# Patient Record
Sex: Female | Born: 1950
Health system: Southern US, Community
[De-identification: ages and names within clinical notes are randomized; demographics above are authoritative.]

## PROBLEM LIST (undated history)

## (undated) DIAGNOSIS — M1990 Unspecified osteoarthritis, unspecified site: Secondary | ICD-10-CM

## (undated) DIAGNOSIS — K219 Gastro-esophageal reflux disease without esophagitis: Secondary | ICD-10-CM

## (undated) DIAGNOSIS — R5383 Other fatigue: Secondary | ICD-10-CM

## (undated) DIAGNOSIS — E785 Hyperlipidemia, unspecified: Secondary | ICD-10-CM

## (undated) DIAGNOSIS — R079 Chest pain, unspecified: Secondary | ICD-10-CM

## (undated) HISTORY — DX: Other fatigue: R53.83

## (undated) HISTORY — PX: ABDOMINAL HYSTERECTOMY: SHX81

## (undated) HISTORY — DX: Chest pain, unspecified: R07.9

## (undated) HISTORY — DX: Gastro-esophageal reflux disease without esophagitis: K21.9

## (undated) HISTORY — PX: KNEE ARTHROPLASTY: SHX992

## (undated) HISTORY — DX: Hyperlipidemia, unspecified: E78.5

## (undated) HISTORY — PX: COLONOSCOPY: SHX174

---

## 2006-12-17 ENCOUNTER — Encounter (INDEPENDENT_AMBULATORY_CARE_PROVIDER_SITE_OTHER): Payer: Self-pay | Admitting: Nurse Practitioner

## 2006-12-17 ENCOUNTER — Ambulatory Visit: Payer: Self-pay | Admitting: Internal Medicine

## 2006-12-17 LAB — CONVERTED CEMR LAB
ALT: 12 units/L (ref 0–35)
AST: 18 units/L (ref 0–37)
Basophils Absolute: 0 10*3/uL (ref 0.0–0.1)
Basophils Relative: 0 % (ref 0–1)
CO2: 25 meq/L (ref 19–32)
Calcium: 9.9 mg/dL (ref 8.4–10.5)
Chloride: 105 meq/L (ref 96–112)
Cholesterol: 218 mg/dL — ABNORMAL HIGH (ref 0–200)
Lymphocytes Relative: 59 % — ABNORMAL HIGH (ref 12–46)
MCHC: 30.6 g/dL (ref 30.0–36.0)
Neutro Abs: 1.6 10*3/uL — ABNORMAL LOW (ref 1.7–7.7)
Platelets: 251 10*3/uL (ref 150–400)
RDW: 13.6 % (ref 11.5–14.0)
Sodium: 140 meq/L (ref 135–145)
TSH: 1.25 microintl units/mL (ref 0.350–5.50)
Total Bilirubin: 1.1 mg/dL (ref 0.3–1.2)
Total CHOL/HDL Ratio: 2.7
Total Protein: 7.3 g/dL (ref 6.0–8.3)
VLDL: 16 mg/dL (ref 0–40)

## 2006-12-18 ENCOUNTER — Encounter (INDEPENDENT_AMBULATORY_CARE_PROVIDER_SITE_OTHER): Payer: Self-pay | Admitting: Nurse Practitioner

## 2006-12-18 LAB — CONVERTED CEMR LAB: TSH: 1.25 microintl units/mL

## 2006-12-19 ENCOUNTER — Ambulatory Visit (HOSPITAL_COMMUNITY): Admission: RE | Admit: 2006-12-19 | Discharge: 2006-12-19 | Payer: Self-pay | Admitting: Nurse Practitioner

## 2006-12-31 ENCOUNTER — Ambulatory Visit: Payer: Self-pay | Admitting: Internal Medicine

## 2006-12-31 ENCOUNTER — Ambulatory Visit: Payer: Self-pay | Admitting: *Deleted

## 2007-02-18 ENCOUNTER — Encounter (INDEPENDENT_AMBULATORY_CARE_PROVIDER_SITE_OTHER): Payer: Self-pay | Admitting: Nurse Practitioner

## 2007-02-18 DIAGNOSIS — K5901 Slow transit constipation: Secondary | ICD-10-CM | POA: Insufficient documentation

## 2007-02-18 DIAGNOSIS — K5909 Other constipation: Secondary | ICD-10-CM

## 2007-02-18 DIAGNOSIS — M1990 Unspecified osteoarthritis, unspecified site: Secondary | ICD-10-CM | POA: Insufficient documentation

## 2007-02-18 DIAGNOSIS — Z9889 Other specified postprocedural states: Secondary | ICD-10-CM | POA: Insufficient documentation

## 2007-02-18 DIAGNOSIS — Z86718 Personal history of other venous thrombosis and embolism: Secondary | ICD-10-CM

## 2007-03-10 ENCOUNTER — Emergency Department (HOSPITAL_COMMUNITY): Admission: EM | Admit: 2007-03-10 | Discharge: 2007-03-10 | Payer: Self-pay | Admitting: Family Medicine

## 2007-04-24 ENCOUNTER — Ambulatory Visit: Payer: Self-pay | Admitting: Family Medicine

## 2007-07-01 ENCOUNTER — Ambulatory Visit: Payer: Self-pay | Admitting: Internal Medicine

## 2007-07-09 ENCOUNTER — Ambulatory Visit (HOSPITAL_COMMUNITY): Admission: RE | Admit: 2007-07-09 | Discharge: 2007-07-09 | Payer: Self-pay | Admitting: Family Medicine

## 2007-08-01 ENCOUNTER — Ambulatory Visit: Payer: Self-pay | Admitting: Family Medicine

## 2007-10-23 ENCOUNTER — Emergency Department (HOSPITAL_COMMUNITY): Admission: EM | Admit: 2007-10-23 | Discharge: 2007-10-23 | Payer: Self-pay | Admitting: Emergency Medicine

## 2007-10-27 ENCOUNTER — Ambulatory Visit: Payer: Self-pay | Admitting: Internal Medicine

## 2007-10-28 ENCOUNTER — Ambulatory Visit (HOSPITAL_COMMUNITY): Admission: RE | Admit: 2007-10-28 | Discharge: 2007-10-28 | Payer: Self-pay | Admitting: Internal Medicine

## 2007-11-05 ENCOUNTER — Ambulatory Visit: Payer: Self-pay | Admitting: Vascular Surgery

## 2007-11-05 ENCOUNTER — Encounter: Payer: Self-pay | Admitting: Internal Medicine

## 2007-11-05 ENCOUNTER — Ambulatory Visit (HOSPITAL_COMMUNITY): Admission: RE | Admit: 2007-11-05 | Discharge: 2007-11-05 | Payer: Self-pay | Admitting: Internal Medicine

## 2008-03-16 ENCOUNTER — Ambulatory Visit: Payer: Self-pay | Admitting: Family Medicine

## 2008-05-20 ENCOUNTER — Encounter (INDEPENDENT_AMBULATORY_CARE_PROVIDER_SITE_OTHER): Payer: Self-pay | Admitting: Internal Medicine

## 2008-05-20 ENCOUNTER — Ambulatory Visit: Payer: Self-pay | Admitting: Internal Medicine

## 2008-05-20 LAB — CONVERTED CEMR LAB
ALT: 18 units/L (ref 0–35)
AST: 20 units/L (ref 0–37)
CO2: 25 meq/L (ref 19–32)
Cholesterol: 218 mg/dL — ABNORMAL HIGH (ref 0–200)
Creatinine, Ser: 0.59 mg/dL (ref 0.40–1.20)
LDL Cholesterol: 136 mg/dL — ABNORMAL HIGH (ref 0–99)
Sodium: 142 meq/L (ref 135–145)
Total Bilirubin: 0.6 mg/dL (ref 0.3–1.2)
Total CHOL/HDL Ratio: 3.2
Total Protein: 7.2 g/dL (ref 6.0–8.3)
VLDL: 14 mg/dL (ref 0–40)

## 2008-09-06 ENCOUNTER — Ambulatory Visit: Payer: Self-pay | Admitting: Internal Medicine

## 2008-09-06 ENCOUNTER — Encounter (INDEPENDENT_AMBULATORY_CARE_PROVIDER_SITE_OTHER): Payer: Self-pay | Admitting: Internal Medicine

## 2008-09-06 LAB — CONVERTED CEMR LAB
Basophils Relative: 0 % (ref 0–1)
Hemoglobin: 10.9 g/dL — ABNORMAL LOW (ref 12.0–15.0)
Lymphocytes Relative: 59 % — ABNORMAL HIGH (ref 12–46)
Lymphs Abs: 3.5 10*3/uL (ref 0.7–4.0)
Monocytes Absolute: 0.3 10*3/uL (ref 0.1–1.0)
Monocytes Relative: 4 % (ref 3–12)
Neutro Abs: 2.1 10*3/uL (ref 1.7–7.7)
Neutrophils Relative %: 36 % — ABNORMAL LOW (ref 43–77)
RBC: 3.85 M/uL — ABNORMAL LOW (ref 3.87–5.11)
TSH: 0.718 microintl units/mL (ref 0.350–4.500)
WBC: 5.9 10*3/uL (ref 4.0–10.5)

## 2008-09-07 ENCOUNTER — Ambulatory Visit (HOSPITAL_COMMUNITY): Admission: RE | Admit: 2008-09-07 | Discharge: 2008-09-07 | Payer: Self-pay | Admitting: *Deleted

## 2008-10-05 ENCOUNTER — Ambulatory Visit: Payer: Self-pay | Admitting: Oncology

## 2008-10-11 LAB — CMP (CANCER CENTER ONLY)
ALT(SGPT): 39 U/L (ref 10–47)
CO2: 28 mEq/L (ref 18–33)
Calcium: 10.3 mg/dL (ref 8.0–10.3)
Chloride: 105 mEq/L (ref 98–108)
Glucose, Bld: 123 mg/dL — ABNORMAL HIGH (ref 73–118)
Sodium: 144 mEq/L (ref 128–145)
Total Protein: 7.6 g/dL (ref 6.4–8.1)

## 2008-10-11 LAB — CBC WITH DIFFERENTIAL (CANCER CENTER ONLY)
BASO%: 0.4 % (ref 0.0–2.0)
EOS%: 2.3 % (ref 0.0–7.0)
LYMPH#: 2.1 10*3/uL (ref 0.9–3.3)
MCHC: 34.9 g/dL (ref 32.0–36.0)
NEUT#: 2.3 10*3/uL (ref 1.5–6.5)
NEUT%: 49.6 % (ref 39.6–80.0)
Platelets: 255 10*3/uL (ref 145–400)
RDW: 12.1 % (ref 10.5–14.6)

## 2008-10-11 LAB — MORPHOLOGY - CHCC SATELLITE

## 2008-10-12 LAB — LACTATE DEHYDROGENASE: LDH: 110 U/L (ref 94–250)

## 2008-10-12 LAB — RETICULOCYTES (CHCC): Retic Ct Pct: 1 % (ref 0.4–3.1)

## 2008-10-12 LAB — ERYTHROPOIETIN: Erythropoietin: 17.3 m[IU]/mL (ref 2.6–34.0)

## 2008-10-12 LAB — IRON AND TIBC: %SAT: 41 % (ref 20–55)

## 2008-10-12 LAB — FOLATE: Folate: 9.8 ng/mL

## 2008-10-12 LAB — VITAMIN B12: Vitamin B-12: 522 pg/mL (ref 211–911)

## 2008-10-12 LAB — FERRITIN: Ferritin: 72 ng/mL (ref 10–291)

## 2009-02-07 ENCOUNTER — Ambulatory Visit: Payer: Self-pay | Admitting: Family Medicine

## 2009-02-08 ENCOUNTER — Encounter (INDEPENDENT_AMBULATORY_CARE_PROVIDER_SITE_OTHER): Payer: Self-pay | Admitting: Internal Medicine

## 2009-02-10 ENCOUNTER — Encounter (INDEPENDENT_AMBULATORY_CARE_PROVIDER_SITE_OTHER): Payer: Self-pay | Admitting: Internal Medicine

## 2009-02-10 ENCOUNTER — Ambulatory Visit: Payer: Self-pay | Admitting: Internal Medicine

## 2009-02-10 LAB — CONVERTED CEMR LAB
Cholesterol: 212 mg/dL — ABNORMAL HIGH (ref 0–200)
Total CHOL/HDL Ratio: 3.4
Vit D, 25-Hydroxy: 23 ng/mL — ABNORMAL LOW (ref 30–89)

## 2009-04-01 ENCOUNTER — Ambulatory Visit: Payer: Self-pay | Admitting: Family Medicine

## 2009-05-19 ENCOUNTER — Ambulatory Visit: Payer: Self-pay | Admitting: Internal Medicine

## 2009-10-18 ENCOUNTER — Ambulatory Visit: Payer: Self-pay | Admitting: Family Medicine

## 2009-12-12 ENCOUNTER — Encounter (INDEPENDENT_AMBULATORY_CARE_PROVIDER_SITE_OTHER): Payer: Self-pay | Admitting: Internal Medicine

## 2009-12-12 ENCOUNTER — Ambulatory Visit: Payer: Self-pay | Admitting: Family Medicine

## 2009-12-12 LAB — CONVERTED CEMR LAB
AST: 18 units/L (ref 0–37)
Albumin: 4 g/dL (ref 3.5–5.2)
Alkaline Phosphatase: 45 units/L (ref 39–117)
BUN: 13 mg/dL (ref 6–23)
Calcium: 9.6 mg/dL (ref 8.4–10.5)
Chloride: 108 meq/L (ref 96–112)
Glucose, Bld: 82 mg/dL (ref 70–99)
Potassium: 5.5 meq/L — ABNORMAL HIGH (ref 3.5–5.3)
Sodium: 143 meq/L (ref 135–145)
Total Protein: 6.8 g/dL (ref 6.0–8.3)

## 2009-12-26 ENCOUNTER — Ambulatory Visit: Payer: Self-pay | Admitting: Internal Medicine

## 2010-07-19 ENCOUNTER — Ambulatory Visit (HOSPITAL_COMMUNITY)
Admission: RE | Admit: 2010-07-19 | Discharge: 2010-07-19 | Disposition: A | Discharge status: Home / Self Care | Payer: Medicare Other | Source: Ambulatory Visit | Attending: Family Medicine | Admitting: Family Medicine

## 2010-07-19 ENCOUNTER — Other Ambulatory Visit (HOSPITAL_COMMUNITY): Payer: Self-pay | Admitting: Family Medicine

## 2010-07-19 DIAGNOSIS — M25569 Pain in unspecified knee: Secondary | ICD-10-CM | POA: Insufficient documentation

## 2010-07-19 DIAGNOSIS — R52 Pain, unspecified: Secondary | ICD-10-CM

## 2011-01-11 LAB — URINALYSIS, ROUTINE W REFLEX MICROSCOPIC
Bilirubin Urine: NEGATIVE
Nitrite: NEGATIVE
Specific Gravity, Urine: 1.007
Urobilinogen, UA: 0.2

## 2011-01-11 LAB — URINE MICROSCOPIC-ADD ON

## 2011-01-11 LAB — BASIC METABOLIC PANEL
BUN: 8
Chloride: 104
GFR calc non Af Amer: >60
Glucose, Bld: 139 — ABNORMAL HIGH
Potassium: 3.5

## 2011-05-07 DIAGNOSIS — L0390 Cellulitis, unspecified: Secondary | ICD-10-CM | POA: Diagnosis not present

## 2011-05-07 DIAGNOSIS — F17200 Nicotine dependence, unspecified, uncomplicated: Secondary | ICD-10-CM | POA: Diagnosis not present

## 2011-05-07 DIAGNOSIS — M2550 Pain in unspecified joint: Secondary | ICD-10-CM | POA: Diagnosis not present

## 2011-05-07 DIAGNOSIS — F519 Sleep disorder not due to a substance or known physiological condition, unspecified: Secondary | ICD-10-CM | POA: Diagnosis not present

## 2011-05-15 ENCOUNTER — Other Ambulatory Visit (HOSPITAL_COMMUNITY): Payer: Self-pay | Admitting: Family Medicine

## 2011-05-15 DIAGNOSIS — M8580 Other specified disorders of bone density and structure, unspecified site: Secondary | ICD-10-CM

## 2011-05-18 ENCOUNTER — Ambulatory Visit (HOSPITAL_COMMUNITY): Payer: Medicare Other

## 2011-05-18 ENCOUNTER — Ambulatory Visit (HOSPITAL_COMMUNITY)
Admission: RE | Admit: 2011-05-18 | Discharge: 2011-05-18 | Disposition: A | Discharge status: Home / Self Care | Payer: Medicare Other | Source: Ambulatory Visit | Attending: Family Medicine | Admitting: Family Medicine

## 2011-05-18 DIAGNOSIS — M8580 Other specified disorders of bone density and structure, unspecified site: Secondary | ICD-10-CM

## 2011-05-18 DIAGNOSIS — Z780 Asymptomatic menopausal state: Secondary | ICD-10-CM | POA: Diagnosis not present

## 2011-05-18 DIAGNOSIS — M949 Disorder of cartilage, unspecified: Secondary | ICD-10-CM | POA: Diagnosis not present

## 2011-05-18 DIAGNOSIS — M899 Disorder of bone, unspecified: Secondary | ICD-10-CM | POA: Diagnosis not present

## 2011-05-18 DIAGNOSIS — Z13820 Encounter for screening for osteoporosis: Secondary | ICD-10-CM | POA: Diagnosis not present

## 2011-10-04 DIAGNOSIS — N760 Acute vaginitis: Secondary | ICD-10-CM | POA: Diagnosis not present

## 2011-10-04 DIAGNOSIS — R319 Hematuria, unspecified: Secondary | ICD-10-CM | POA: Diagnosis not present

## 2011-10-29 DIAGNOSIS — R319 Hematuria, unspecified: Secondary | ICD-10-CM | POA: Diagnosis not present

## 2011-10-29 DIAGNOSIS — R5383 Other fatigue: Secondary | ICD-10-CM | POA: Diagnosis not present

## 2011-10-29 DIAGNOSIS — G4700 Insomnia, unspecified: Secondary | ICD-10-CM | POA: Diagnosis not present

## 2011-10-29 DIAGNOSIS — N760 Acute vaginitis: Secondary | ICD-10-CM | POA: Diagnosis not present

## 2011-10-29 DIAGNOSIS — R5381 Other malaise: Secondary | ICD-10-CM | POA: Diagnosis not present

## 2011-10-29 DIAGNOSIS — Z79899 Other long term (current) drug therapy: Secondary | ICD-10-CM | POA: Diagnosis not present

## 2011-10-29 DIAGNOSIS — E785 Hyperlipidemia, unspecified: Secondary | ICD-10-CM | POA: Diagnosis not present

## 2011-12-03 DIAGNOSIS — G5600 Carpal tunnel syndrome, unspecified upper limb: Secondary | ICD-10-CM | POA: Diagnosis not present

## 2011-12-03 DIAGNOSIS — M129 Arthropathy, unspecified: Secondary | ICD-10-CM | POA: Diagnosis not present

## 2011-12-03 DIAGNOSIS — N390 Urinary tract infection, site not specified: Secondary | ICD-10-CM | POA: Diagnosis not present

## 2011-12-03 DIAGNOSIS — N760 Acute vaginitis: Secondary | ICD-10-CM | POA: Diagnosis not present

## 2012-01-04 DIAGNOSIS — B379 Candidiasis, unspecified: Secondary | ICD-10-CM | POA: Diagnosis not present

## 2012-01-04 DIAGNOSIS — E119 Type 2 diabetes mellitus without complications: Secondary | ICD-10-CM | POA: Diagnosis not present

## 2012-01-04 DIAGNOSIS — N390 Urinary tract infection, site not specified: Secondary | ICD-10-CM | POA: Diagnosis not present

## 2012-02-06 DIAGNOSIS — Z87440 Personal history of urinary (tract) infections: Secondary | ICD-10-CM | POA: Diagnosis not present

## 2012-02-06 DIAGNOSIS — R82998 Other abnormal findings in urine: Secondary | ICD-10-CM | POA: Diagnosis not present

## 2012-03-07 DIAGNOSIS — N3020 Other chronic cystitis without hematuria: Secondary | ICD-10-CM | POA: Diagnosis not present

## 2012-03-07 DIAGNOSIS — R82998 Other abnormal findings in urine: Secondary | ICD-10-CM | POA: Diagnosis not present

## 2012-04-22 DIAGNOSIS — B373 Candidiasis of vulva and vagina: Secondary | ICD-10-CM | POA: Diagnosis not present

## 2012-04-22 DIAGNOSIS — N3020 Other chronic cystitis without hematuria: Secondary | ICD-10-CM | POA: Diagnosis not present

## 2012-05-05 DIAGNOSIS — N760 Acute vaginitis: Secondary | ICD-10-CM | POA: Diagnosis not present

## 2012-05-05 DIAGNOSIS — J309 Allergic rhinitis, unspecified: Secondary | ICD-10-CM | POA: Diagnosis not present

## 2012-05-05 DIAGNOSIS — R1084 Generalized abdominal pain: Secondary | ICD-10-CM | POA: Diagnosis not present

## 2012-06-11 DIAGNOSIS — R3129 Other microscopic hematuria: Secondary | ICD-10-CM | POA: Diagnosis not present

## 2012-07-02 DIAGNOSIS — N390 Urinary tract infection, site not specified: Secondary | ICD-10-CM | POA: Diagnosis not present

## 2012-07-02 DIAGNOSIS — R3129 Other microscopic hematuria: Secondary | ICD-10-CM | POA: Diagnosis not present

## 2012-07-02 DIAGNOSIS — N3020 Other chronic cystitis without hematuria: Secondary | ICD-10-CM | POA: Diagnosis not present

## 2012-07-14 DIAGNOSIS — M1710 Unilateral primary osteoarthritis, unspecified knee: Secondary | ICD-10-CM | POA: Diagnosis not present

## 2012-10-15 DIAGNOSIS — M25569 Pain in unspecified knee: Secondary | ICD-10-CM | POA: Diagnosis not present

## 2012-10-15 DIAGNOSIS — M1710 Unilateral primary osteoarthritis, unspecified knee: Secondary | ICD-10-CM | POA: Diagnosis not present

## 2012-10-15 DIAGNOSIS — M5137 Other intervertebral disc degeneration, lumbosacral region: Secondary | ICD-10-CM | POA: Diagnosis not present

## 2012-10-27 ENCOUNTER — Ambulatory Visit: Payer: Medicare Other | Admitting: Physical Therapy

## 2012-11-03 ENCOUNTER — Ambulatory Visit: Payer: Medicare Other | Attending: Orthopedic Surgery | Admitting: Physical Therapy

## 2012-11-03 DIAGNOSIS — M545 Low back pain, unspecified: Secondary | ICD-10-CM | POA: Insufficient documentation

## 2012-11-03 DIAGNOSIS — IMO0001 Reserved for inherently not codable concepts without codable children: Secondary | ICD-10-CM | POA: Insufficient documentation

## 2012-11-07 ENCOUNTER — Ambulatory Visit: Payer: Medicare Other | Admitting: Physical Therapy

## 2012-11-10 ENCOUNTER — Ambulatory Visit: Payer: Medicare Other | Admitting: Physical Therapy

## 2012-11-12 ENCOUNTER — Ambulatory Visit: Payer: Medicare Other | Admitting: Physical Therapy

## 2012-11-18 ENCOUNTER — Ambulatory Visit: Payer: Medicare Other | Attending: Orthopedic Surgery | Admitting: Physical Therapy

## 2012-11-18 DIAGNOSIS — IMO0001 Reserved for inherently not codable concepts without codable children: Secondary | ICD-10-CM | POA: Insufficient documentation

## 2012-11-18 DIAGNOSIS — M545 Low back pain, unspecified: Secondary | ICD-10-CM | POA: Insufficient documentation

## 2012-11-19 ENCOUNTER — Ambulatory Visit: Payer: Medicare Other | Admitting: Physical Therapy

## 2012-11-27 ENCOUNTER — Ambulatory Visit: Payer: Medicare Other | Attending: Orthopedic Surgery | Admitting: Rehabilitation

## 2012-11-27 DIAGNOSIS — M545 Low back pain, unspecified: Secondary | ICD-10-CM | POA: Diagnosis not present

## 2012-11-27 DIAGNOSIS — IMO0001 Reserved for inherently not codable concepts without codable children: Secondary | ICD-10-CM | POA: Insufficient documentation

## 2012-12-09 DIAGNOSIS — R5381 Other malaise: Secondary | ICD-10-CM | POA: Diagnosis not present

## 2012-12-09 DIAGNOSIS — Z79899 Other long term (current) drug therapy: Secondary | ICD-10-CM | POA: Diagnosis not present

## 2012-12-09 DIAGNOSIS — R634 Abnormal weight loss: Secondary | ICD-10-CM | POA: Diagnosis not present

## 2012-12-09 DIAGNOSIS — E785 Hyperlipidemia, unspecified: Secondary | ICD-10-CM | POA: Diagnosis not present

## 2012-12-24 DIAGNOSIS — M25569 Pain in unspecified knee: Secondary | ICD-10-CM | POA: Diagnosis not present

## 2013-02-20 DIAGNOSIS — R5381 Other malaise: Secondary | ICD-10-CM | POA: Diagnosis not present

## 2013-05-07 DIAGNOSIS — M129 Arthropathy, unspecified: Secondary | ICD-10-CM | POA: Diagnosis not present

## 2013-08-10 DIAGNOSIS — R5381 Other malaise: Secondary | ICD-10-CM | POA: Diagnosis not present

## 2013-08-10 DIAGNOSIS — R5383 Other fatigue: Secondary | ICD-10-CM | POA: Diagnosis not present

## 2013-08-10 DIAGNOSIS — K644 Residual hemorrhoidal skin tags: Secondary | ICD-10-CM | POA: Diagnosis not present

## 2013-08-10 DIAGNOSIS — R634 Abnormal weight loss: Secondary | ICD-10-CM | POA: Diagnosis not present

## 2013-08-10 DIAGNOSIS — Z79899 Other long term (current) drug therapy: Secondary | ICD-10-CM | POA: Diagnosis not present

## 2013-08-10 DIAGNOSIS — E785 Hyperlipidemia, unspecified: Secondary | ICD-10-CM | POA: Diagnosis not present

## 2013-08-10 DIAGNOSIS — M129 Arthropathy, unspecified: Secondary | ICD-10-CM | POA: Diagnosis not present

## 2013-08-27 DIAGNOSIS — H2510 Age-related nuclear cataract, unspecified eye: Secondary | ICD-10-CM | POA: Diagnosis not present

## 2013-08-27 DIAGNOSIS — H40039 Anatomical narrow angle, unspecified eye: Secondary | ICD-10-CM | POA: Diagnosis not present

## 2013-11-10 DIAGNOSIS — R3129 Other microscopic hematuria: Secondary | ICD-10-CM | POA: Diagnosis not present

## 2013-12-10 DIAGNOSIS — M25569 Pain in unspecified knee: Secondary | ICD-10-CM | POA: Diagnosis not present

## 2013-12-10 DIAGNOSIS — M5137 Other intervertebral disc degeneration, lumbosacral region: Secondary | ICD-10-CM | POA: Diagnosis not present

## 2014-01-13 DIAGNOSIS — M25569 Pain in unspecified knee: Secondary | ICD-10-CM | POA: Diagnosis not present

## 2014-01-21 DIAGNOSIS — M25562 Pain in left knee: Secondary | ICD-10-CM | POA: Diagnosis not present

## 2014-06-04 DIAGNOSIS — M1712 Unilateral primary osteoarthritis, left knee: Secondary | ICD-10-CM | POA: Diagnosis not present

## 2014-07-29 DIAGNOSIS — R079 Chest pain, unspecified: Secondary | ICD-10-CM | POA: Diagnosis not present

## 2014-07-29 DIAGNOSIS — R0789 Other chest pain: Secondary | ICD-10-CM | POA: Diagnosis not present

## 2014-07-29 DIAGNOSIS — K219 Gastro-esophageal reflux disease without esophagitis: Secondary | ICD-10-CM | POA: Diagnosis not present

## 2014-07-29 DIAGNOSIS — E780 Pure hypercholesterolemia: Secondary | ICD-10-CM | POA: Diagnosis not present

## 2014-07-29 DIAGNOSIS — G4700 Insomnia, unspecified: Secondary | ICD-10-CM | POA: Diagnosis not present

## 2014-07-29 DIAGNOSIS — R5383 Other fatigue: Secondary | ICD-10-CM | POA: Diagnosis not present

## 2014-07-29 DIAGNOSIS — R5382 Chronic fatigue, unspecified: Secondary | ICD-10-CM | POA: Diagnosis not present

## 2014-08-10 ENCOUNTER — Ambulatory Visit (INDEPENDENT_AMBULATORY_CARE_PROVIDER_SITE_OTHER): Payer: Medicare Other | Admitting: Cardiology

## 2014-08-10 ENCOUNTER — Encounter: Payer: Self-pay | Admitting: Cardiology

## 2014-08-10 VITALS — BP 100/62 | HR 72 | Ht 61.0 in | Wt 102.8 lb

## 2014-08-10 DIAGNOSIS — R079 Chest pain, unspecified: Secondary | ICD-10-CM | POA: Insufficient documentation

## 2014-08-10 DIAGNOSIS — R0789 Other chest pain: Secondary | ICD-10-CM

## 2014-08-10 NOTE — Progress Notes (Signed)
Sarah Moore Date of Birth: [birth year] Medical Record #[id number]  History of Present Illness: Sarah Moore is seen at the request of Dr. Jimmye Norman for evaluation of chest pain. She reports she began having symptoms of chest tightness about 3 weeks ago. No clear aggravating or alleviating factors. Not associated with exertion or meals. Symptoms localized to left sternal area. No dyspnea or diaphoresis. No prior cardiac history. Risk factors of smoking and mild hypercholesterolemia. Since starting on Prilosec symptoms have completely resolved.    Medication List       This list is accurate as of: 08/10/14  3:11 PM.  Always use your most recent med list.               aspirin 81 MG tablet  Take 81 mg by mouth daily.     omeprazole 20 MG capsule  Commonly known as:  PRILOSEC  Take 20 mg by mouth daily.         Allergies  Allergen Reactions  . Codeine     Past Medical History  Diagnosis Date  . Chest pain   . Acid reflux   . Fatigue   . Hyperlipidemia     Past Surgical History  Procedure Laterality Date  . Knee surgery Left     arthroscopic    History   Social History  . Marital Status: Single    Spouse Name: N/A  . Number of Children: 3  . Years of Education: N/A   Occupational History  . [employer]    Social History Main Topics  . Smoking status: Current Every Day Smoker  . Smokeless tobacco: Not on file  . Alcohol Use: Not on file  . Drug Use: Not on file  . Sexual Activity: Not on file   Other Topics Concern  . None   Social History Narrative    Family History  Problem Relation Age of Onset  . Adopted: Yes  . Asthma Brother   . Cancer Brother     Review of Systems: As noted in HPI. Some weight loss this year.  All other systems were reviewed and are negative.  Physical Exam: BP 100/62 mmHg  Pulse 72  Ht 5\' 1"  (1.549 m)  Wt 102 lb 12.8 oz (46.63 kg)  BMI 19.43 kg/m2 Filed Weights   08/10/14 1419  Weight: 102 lb 12.8 oz (46.63  kg)  GENERAL:  Well appearing, thin BF in NAD HEENT:  PERRL, EOMI, sclera are clear. Oropharynx is clear. NECK:  No jugular venous distention, carotid upstroke brisk and symmetric, no bruits, no thyromegaly or adenopathy LUNGS:  Clear to auscultation bilaterally CHEST:  Unremarkable HEART:  RRR,  PMI not displaced or sustained,S1 and S2 within normal limits, no S3, no S4: no clicks, no rubs, no murmurs ABD:  Soft, nontender. BS +, no masses or bruits. No hepatomegaly, no splenomegaly EXT:  2 + pulses throughout, no edema, no cyanosis no clubbing SKIN:  Warm and dry.  No rashes NEURO:  Alert and oriented x 3. Cranial nerves II through XII intact. PSYCH:  Cognitively intact    LABORATORY DATA: Ecg dated 07/29/14 shows NSR with normal Ecg.  Assessment / Plan: 1. Atypical chest pain. Patient does have risk factors of tobacco abuse and hypercholesterolemia. Symptoms have completely resolved with antireflux therapy suggesting that this is all acid reflux. Given her improvement I don't feel that further cardiac evaluation is warranted. If symptoms recur would consider stress testing and I have asked her to contact me  if symptoms return.  2. Tobacco abuse- recommend smoking cessation.

## 2014-08-10 NOTE — Patient Instructions (Signed)
Continue your current therapy  If your chest pain recurs on omeprazole we would consider doing a stress test. Call me if your symptoms increase.

## 2014-08-22 ENCOUNTER — Emergency Department (HOSPITAL_COMMUNITY): Payer: Medicare Other

## 2014-08-22 ENCOUNTER — Encounter (HOSPITAL_COMMUNITY): Payer: Self-pay | Admitting: Emergency Medicine

## 2014-08-22 ENCOUNTER — Emergency Department (HOSPITAL_COMMUNITY)
Admission: EM | Admit: 2014-08-22 | Discharge: 2014-08-22 | Disposition: A | Discharge status: Home / Self Care | Payer: Medicare Other | Attending: Emergency Medicine | Admitting: Emergency Medicine

## 2014-08-22 DIAGNOSIS — Z8639 Personal history of other endocrine, nutritional and metabolic disease: Secondary | ICD-10-CM | POA: Diagnosis not present

## 2014-08-22 DIAGNOSIS — Z7982 Long term (current) use of aspirin: Secondary | ICD-10-CM | POA: Diagnosis not present

## 2014-08-22 DIAGNOSIS — K219 Gastro-esophageal reflux disease without esophagitis: Secondary | ICD-10-CM | POA: Diagnosis not present

## 2014-08-22 DIAGNOSIS — R079 Chest pain, unspecified: Secondary | ICD-10-CM | POA: Insufficient documentation

## 2014-08-22 DIAGNOSIS — Z720 Tobacco use: Secondary | ICD-10-CM | POA: Diagnosis not present

## 2014-08-22 DIAGNOSIS — R0789 Other chest pain: Secondary | ICD-10-CM | POA: Diagnosis not present

## 2014-08-22 DIAGNOSIS — Z79899 Other long term (current) drug therapy: Secondary | ICD-10-CM | POA: Insufficient documentation

## 2014-08-22 DIAGNOSIS — K2100 Gastro-esophageal reflux disease with esophagitis, without bleeding: Secondary | ICD-10-CM

## 2014-08-22 DIAGNOSIS — M1990 Unspecified osteoarthritis, unspecified site: Secondary | ICD-10-CM | POA: Diagnosis not present

## 2014-08-22 HISTORY — DX: Unspecified osteoarthritis, unspecified site: M19.90

## 2014-08-22 LAB — COMPREHENSIVE METABOLIC PANEL
ALBUMIN: 3.6 g/dL (ref 3.5–5.0)
ALK PHOS: 60 U/L (ref 38–126)
ALT: 20 U/L (ref 14–54)
ANION GAP: 8 (ref 5–15)
AST: 22 U/L (ref 15–41)
BUN: 14 mg/dL (ref 6–20)
CALCIUM: 9.1 mg/dL (ref 8.9–10.3)
CO2: 24 mmol/L (ref 22–32)
Chloride: 105 mmol/L (ref 101–111)
Creatinine, Ser: 0.55 mg/dL (ref 0.44–1.00)
GFR calc non Af Amer: >60 mL/min (ref >60)
GLUCOSE: 73 mg/dL (ref 70–99)
POTASSIUM: 4.4 mmol/L (ref 3.5–5.1)
Sodium: 137 mmol/L (ref 135–145)
TOTAL PROTEIN: 7 g/dL (ref 6.5–8.1)
Total Bilirubin: 1 mg/dL (ref 0.3–1.2)

## 2014-08-22 LAB — CBC WITH DIFFERENTIAL/PLATELET
Basophils Absolute: 0 10*3/uL (ref 0.0–0.1)
Basophils Relative: 0 % (ref 0–1)
EOS PCT: 0 % (ref 0–5)
Eosinophils Absolute: 0 10*3/uL (ref 0.0–0.7)
HCT: 34.6 % — ABNORMAL LOW (ref 36.0–46.0)
HEMOGLOBIN: 11.2 g/dL — AB (ref 12.0–15.0)
LYMPHS ABS: 1.8 10*3/uL (ref 0.7–4.0)
Lymphocytes Relative: 42 % (ref 12–46)
MCH: 29.4 pg (ref 26.0–34.0)
MCHC: 32.4 g/dL (ref 30.0–36.0)
MCV: 90.8 fL (ref 78.0–100.0)
MONOS PCT: 16 % — AB (ref 3–12)
Monocytes Absolute: 0.7 10*3/uL (ref 0.1–1.0)
Neutro Abs: 1.8 10*3/uL (ref 1.7–7.7)
Neutrophils Relative %: 42 % — ABNORMAL LOW (ref 43–77)
Platelets: 207 10*3/uL (ref 150–400)
RBC: 3.81 MIL/uL — ABNORMAL LOW (ref 3.87–5.11)
RDW: 13.4 % (ref 11.5–15.5)
WBC: 4.2 10*3/uL (ref 4.0–10.5)

## 2014-08-22 LAB — LIPASE, BLOOD: Lipase: 18 U/L — ABNORMAL LOW (ref 22–51)

## 2014-08-22 LAB — TROPONIN I

## 2014-08-22 MED ORDER — SUCRALFATE 1 G PO TABS: 1.0000 g | ORAL_TABLET | Freq: Three times a day (TID) | ORAL | Status: DC

## 2014-08-22 MED ORDER — GI COCKTAIL ~~LOC~~
30.0000 mL | Freq: Once | ORAL | Status: AC
  Administered 2014-08-22: 30 mL via ORAL
  Filled 2014-08-22: qty 30

## 2014-08-22 MED ORDER — SUCRALFATE 1 G PO TABS
1.0000 g | ORAL_TABLET | Freq: Once | ORAL | Status: AC
  Administered 2014-08-22: 1 g via ORAL
  Filled 2014-08-22: qty 1

## 2014-08-22 NOTE — ED Notes (Signed)
Pt states she has had recurring chest discomfort and acid reflux.  Was given Prilosec, has taken this morning, but no relief. Pain with no other symptoms, states pain is 6/10 with burning.

## 2014-08-22 NOTE — ED Notes (Signed)
Patient transported to X-ray 

## 2014-08-22 NOTE — ED Notes (Signed)
Awake. Verbally responsive. Resp even and unlabored. No audible adventitious breath sounds noted. ABC's intact. Abd soft/nondistended but tender to palpate. BS (+) and active x4 quadrants. No N/V/D reported. 

## 2014-08-22 NOTE — Discharge Instructions (Signed)
Gastroesophageal Reflux Disease, Adult  Gastroesophageal reflux disease (GERD) happens when acid from your stomach flows up into the esophagus. When acid comes in contact with the esophagus, the acid causes soreness (inflammation) in the esophagus. Over time, GERD may create small holes (ulcers) in the lining of the esophagus.  CAUSES   · Increased body weight. This puts pressure on the stomach, making acid rise from the stomach into the esophagus.  · Smoking. This increases acid production in the stomach.  · Drinking alcohol. This causes decreased pressure in the lower esophageal sphincter (valve or ring of muscle between the esophagus and stomach), allowing acid from the stomach into the esophagus.  · Late evening meals and a full stomach. This increases pressure and acid production in the stomach.  · A malformed lower esophageal sphincter.  Sometimes, no cause is found.  SYMPTOMS   · Burning pain in the lower part of the mid-chest behind the breastbone and in the mid-stomach area. This may occur twice a week or more often.  · Trouble swallowing.  · Sore throat.  · Dry cough.  · Asthma-like symptoms including chest tightness, shortness of breath, or wheezing.  DIAGNOSIS   Your caregiver may be able to diagnose GERD based on your symptoms. In some cases, X-rays and other tests may be done to check for complications or to check the condition of your stomach and esophagus.  TREATMENT   Your caregiver may recommend over-the-counter or prescription medicines to help decrease acid production. Ask your caregiver before starting or adding any new medicines.   HOME CARE INSTRUCTIONS   · Change the factors that you can control. Ask your caregiver for guidance concerning weight loss, quitting smoking, and alcohol consumption.  · Avoid foods and drinks that make your symptoms worse, such as:  ¨ Caffeine or alcoholic drinks.  ¨ Chocolate.  ¨ Peppermint or mint flavorings.  ¨ Garlic and onions.  ¨ Spicy foods.  ¨ Citrus fruits,  such as oranges, lemons, or limes.  ¨ Tomato-based foods such as sauce, chili, salsa, and pizza.  ¨ Fried and fatty foods.  · Avoid lying down for the 3 hours prior to your bedtime or prior to taking a nap.  · Eat small, frequent meals instead of large meals.  · Wear loose-fitting clothing. Do not wear anything tight around your waist that causes pressure on your stomach.  · Raise the head of your bed 6 to 8 inches with wood blocks to help you sleep. Extra pillows will not help.  · Only take over-the-counter or prescription medicines for pain, discomfort, or fever as directed by your caregiver.  · Do not take aspirin, ibuprofen, or other nonsteroidal anti-inflammatory drugs (NSAIDs).  SEEK IMMEDIATE MEDICAL CARE IF:   · You have pain in your arms, neck, jaw, teeth, or back.  · Your pain increases or changes in intensity or duration.  · You develop nausea, vomiting, or sweating (diaphoresis).  · You develop shortness of breath, or you faint.  · Your vomit is green, yellow, black, or looks like coffee grounds or blood.  · Your stool is red, bloody, or black.  These symptoms could be signs of other problems, such as heart disease, gastric bleeding, or esophageal bleeding.  MAKE SURE YOU:   · Understand these instructions.  · Will watch your condition.  · Will get help right away if you are not doing well or get worse.  Document Released: 01/10/2005 Document Revised: 06/25/2011 Document Reviewed: 10/20/2010  ExitCare® Patient   Information ©2015 [employer]. This information is not intended to replace advice given to you by your health care provider. Make sure you discuss any questions you have with your health care provider.  Food Choices for Gastroesophageal Reflux Disease  When you have gastroesophageal reflux disease (GERD), the foods you eat and your eating habits are very important. Choosing the right foods can help ease the discomfort of GERD.  WHAT GENERAL GUIDELINES DO I NEED TO FOLLOW?  · Choose fruits,  vegetables, whole grains, low-fat dairy products, and low-fat meat, fish, and poultry.  · Limit fats such as oils, salad dressings, butter, nuts, and avocado.  · Keep a food diary to identify foods that cause symptoms.  · Avoid foods that cause reflux. These may be different for different people.  · Eat frequent small meals instead of three large meals each day.  · Eat your meals slowly, in a relaxed setting.  · Limit fried foods.  · Cook foods using methods other than frying.  · Avoid drinking alcohol.  · Avoid drinking large amounts of liquids with your meals.  · Avoid bending over or lying down until 2-3 hours after eating.  WHAT FOODS ARE NOT RECOMMENDED?  The following are some foods and drinks that may worsen your symptoms:  Vegetables  Tomatoes. Tomato juice. Tomato and spaghetti sauce. Chili peppers. Onion and garlic. Horseradish.  Fruits  Oranges, grapefruit, and lemon (fruit and juice).  Meats  High-fat meats, fish, and poultry. This includes hot dogs, ribs, ham, sausage, salami, and bacon.  Dairy  Whole milk and chocolate milk. Sour cream. Cream. Butter. Ice cream. Cream cheese.   Beverages  Coffee and tea, with or without caffeine. Carbonated beverages or energy drinks.  Condiments  Hot sauce. Barbecue sauce.   Sweets/Desserts  Chocolate and cocoa. Donuts. Peppermint and spearmint.  Fats and Oils  High-fat foods, including French fries and potato chips.  Other  Vinegar. Strong spices, such as black pepper, white pepper, red pepper, cayenne, curry powder, cloves, ginger, and chili powder.  The items listed above may not be a complete list of foods and beverages to avoid. Contact your dietitian for more information.  Document Released: 04/02/2005 Document Revised: 04/07/2013 Document Reviewed: 02/04/2013  ExitCare® Patient Information ©2015 [employer]. This information is not intended to replace advice given to you by your health care provider. Make sure you discuss any questions you have with your  health care provider.

## 2014-08-22 NOTE — ED Notes (Signed)
Pt in exray, nurse will start IV when pt get's back.

## 2014-08-22 NOTE — ED Notes (Signed)
Awake. Verbally responsive. A/O x4. Resp even and unlabored. No audible adventitious breath sounds noted. ABC's intact.  

## 2014-08-22 NOTE — ED Provider Notes (Signed)
CSN: [id number]     Arrival date & time 08/22/14  1152 History   First MD Initiated Contact with Patient 08/22/14 1214     Chief Complaint  Patient presents with  . Chest Pain     (Consider location/radiation/quality/duration/timing/severity/associated sxs/prior Treatment) HPI Comments: Patient here complaining of 24 hours persistent epigastric pain radiating to her chest. Pain started after she ate fried chicken. History of esophagitis that was by her physician. Denies any anginal type symptoms. No vomiting or fever. Diarrhea noted. No cough or congestion. Has been using her and assess as directed. Has not tried to eat anything since the initial consult. Denies any gallbladder history. Symptoms persistent. Has been seen by cardiology in the past and also felt that this was GERD.  Patient is a 64 y.o. female presenting with chest pain. The history is provided by the patient.  Chest Pain   Past Medical History  Diagnosis Date  . Chest pain   . Acid reflux   . Fatigue   . Hyperlipidemia   . Arthritis    Past Surgical History  Procedure Laterality Date  . Knee surgery Left     arthroscopic   Family History  Problem Relation Age of Onset  . Adopted: Yes  . Asthma Brother   . Cancer Brother    History  Substance Use Topics  . Smoking status: Current Every Day Smoker  . Smokeless tobacco: Not on file  . Alcohol Use: No   OB History    No data available     Review of Systems  Cardiovascular: Positive for chest pain.  All other systems reviewed and are negative.     Allergies  Codeine  Home Medications   Prior to Admission medications   Medication Sig Start Date End Date Taking? Authorizing Provider  aspirin 81 MG tablet Take 81 mg by mouth daily.    Historical Provider, MD  omeprazole (PRILOSEC) 20 MG capsule Take 20 mg by mouth daily.    Historical Provider, MD   BP 114/66 mmHg  Pulse 87  Temp(Src) 98.7 F (37.1 C) (Oral)  Resp 16  SpO2 99% Physical Exam   Constitutional: She is oriented to person, place, and time. She appears well-developed and well-nourished.  Non-toxic appearance. No distress.  HENT:  Head: Normocephalic and atraumatic.  Eyes: Conjunctivae, EOM and lids are normal. Pupils are equal, round, and reactive to light.  Neck: Normal range of motion. Neck supple. No tracheal deviation present. No thyroid mass present.  Cardiovascular: Normal rate, regular rhythm and normal heart sounds.  Exam reveals no gallop.   No murmur heard. Pulmonary/Chest: Effort normal and breath sounds normal. No stridor. No respiratory distress. She has no decreased breath sounds. She has no wheezes. She has no rhonchi. She has no rales.  Abdominal: Soft. Normal appearance and bowel sounds are normal. She exhibits no distension. There is tenderness in the epigastric area. There is no rigidity, no rebound, no guarding and no CVA tenderness.  Musculoskeletal: Normal range of motion. She exhibits no edema or tenderness.  Neurological: She is alert and oriented to person, place, and time. She has normal strength. No cranial nerve deficit or sensory deficit. GCS eye subscore is 4. GCS verbal subscore is 5. GCS motor subscore is 6.  Skin: Skin is warm and dry. No abrasion and no rash noted.  Psychiatric: She has a normal mood and affect. Her speech is normal and behavior is normal.  Nursing note and vitals reviewed.   ED Course  Procedures (including critical care time) Labs Review Labs Reviewed  CBC WITH DIFFERENTIAL/PLATELET  COMPREHENSIVE METABOLIC PANEL  LIPASE, BLOOD  TROPONIN I    Imaging Review No results found.   EKG Interpretation   Date/Time:  Sunday Aug 22 2014 12:08:18 EDT Ventricular Rate:  88 PR Interval:  181 QRS Duration: 85 QT Interval:  362 QTC Calculation: 438 R Axis:   34 Text Interpretation:  Sinus rhythm Probable left atrial enlargement RSR'  in V1 or V2, probably normal variant Confirmed by Dotti Busey  MD, Aarianna Hoadley  ([id number]) on  08/22/2014 12:22:46 PM      MDM   Final diagnoses:  Chest pain      Pt given meds for reflux and feels better, doubt acs, suspect gerd, stable for d/c  Lacretia Leigh, MD 08/22/14 [phone]

## 2014-08-22 NOTE — ED Notes (Signed)
Awake. Verbally responsive. A/O x4. Resp even and unlabored. No audible adventitious breath sounds noted. ABC's intact. SR on monitor. IV saline lock patent and intact. Family at bedside. 

## 2014-08-24 ENCOUNTER — Emergency Department (HOSPITAL_COMMUNITY)
Admission: EM | Admit: 2014-08-24 | Discharge: 2014-08-24 | Disposition: A | Discharge status: Home / Self Care | Payer: Medicare Other | Attending: Emergency Medicine | Admitting: Emergency Medicine

## 2014-08-24 ENCOUNTER — Encounter (HOSPITAL_COMMUNITY): Payer: Self-pay

## 2014-08-24 DIAGNOSIS — Z7982 Long term (current) use of aspirin: Secondary | ICD-10-CM | POA: Diagnosis not present

## 2014-08-24 DIAGNOSIS — I959 Hypotension, unspecified: Secondary | ICD-10-CM | POA: Diagnosis not present

## 2014-08-24 DIAGNOSIS — Z8639 Personal history of other endocrine, nutritional and metabolic disease: Secondary | ICD-10-CM | POA: Insufficient documentation

## 2014-08-24 DIAGNOSIS — R55 Syncope and collapse: Secondary | ICD-10-CM | POA: Diagnosis not present

## 2014-08-24 DIAGNOSIS — Z79899 Other long term (current) drug therapy: Secondary | ICD-10-CM | POA: Diagnosis not present

## 2014-08-24 DIAGNOSIS — K219 Gastro-esophageal reflux disease without esophagitis: Secondary | ICD-10-CM | POA: Insufficient documentation

## 2014-08-24 DIAGNOSIS — R42 Dizziness and giddiness: Secondary | ICD-10-CM | POA: Insufficient documentation

## 2014-08-24 DIAGNOSIS — R531 Weakness: Secondary | ICD-10-CM | POA: Diagnosis not present

## 2014-08-24 DIAGNOSIS — Z87891 Personal history of nicotine dependence: Secondary | ICD-10-CM | POA: Insufficient documentation

## 2014-08-24 DIAGNOSIS — R404 Transient alteration of awareness: Secondary | ICD-10-CM | POA: Diagnosis not present

## 2014-08-24 DIAGNOSIS — M1990 Unspecified osteoarthritis, unspecified site: Secondary | ICD-10-CM | POA: Insufficient documentation

## 2014-08-24 LAB — CBC WITH DIFFERENTIAL/PLATELET
BASOS ABS: 0 10*3/uL (ref 0.0–0.1)
BASOS PCT: 0 % (ref 0–1)
EOS PCT: 0 % (ref 0–5)
Eosinophils Absolute: 0 10*3/uL (ref 0.0–0.7)
HEMATOCRIT: 34.3 % — AB (ref 36.0–46.0)
Hemoglobin: 11.2 g/dL — ABNORMAL LOW (ref 12.0–15.0)
Lymphocytes Relative: 51 % — ABNORMAL HIGH (ref 12–46)
Lymphs Abs: 2 10*3/uL (ref 0.7–4.0)
MCH: 28.9 pg (ref 26.0–34.0)
MCHC: 32.7 g/dL (ref 30.0–36.0)
MCV: 88.4 fL (ref 78.0–100.0)
MONO ABS: 0.4 10*3/uL (ref 0.1–1.0)
MONOS PCT: 10 % (ref 3–12)
Neutro Abs: 1.6 10*3/uL — ABNORMAL LOW (ref 1.7–7.7)
Neutrophils Relative %: 39 % — ABNORMAL LOW (ref 43–77)
Platelets: 171 10*3/uL (ref 150–400)
RBC: 3.88 MIL/uL (ref 3.87–5.11)
RDW: 13.3 % (ref 11.5–15.5)
WBC: 4 10*3/uL (ref 4.0–10.5)

## 2014-08-24 LAB — COMPREHENSIVE METABOLIC PANEL
ALT: 24 U/L (ref 14–54)
AST: 31 U/L (ref 15–41)
Albumin: 3.4 g/dL — ABNORMAL LOW (ref 3.5–5.0)
Alkaline Phosphatase: 53 U/L (ref 38–126)
Anion gap: 8 (ref 5–15)
BILIRUBIN TOTAL: 1.1 mg/dL (ref 0.3–1.2)
BUN: 9 mg/dL (ref 6–20)
CO2: 25 mmol/L (ref 22–32)
CREATININE: 0.59 mg/dL (ref 0.44–1.00)
Calcium: 9.4 mg/dL (ref 8.9–10.3)
Chloride: 107 mmol/L (ref 101–111)
Glucose, Bld: 105 mg/dL — ABNORMAL HIGH (ref 70–99)
POTASSIUM: 4.1 mmol/L (ref 3.5–5.1)
Sodium: 140 mmol/L (ref 135–145)
Total Protein: 6.5 g/dL (ref 6.5–8.1)

## 2014-08-24 LAB — CBG MONITORING, ED: GLUCOSE-CAPILLARY: 84 mg/dL (ref 70–99)

## 2014-08-24 MED ORDER — SODIUM CHLORIDE 0.9 % IV BOLUS (SEPSIS)
1000.0000 mL | Freq: Once | INTRAVENOUS | Status: AC
  Administered 2014-08-24: 1000 mL via INTRAVENOUS

## 2014-08-24 NOTE — ED Notes (Signed)
[facility]- called out for near syncope. Hypotensive en route, as low as 64 systolic. Pt a&o X4, never lost consciousness. C/o back pain last night, none at present. Last bp 80 palpated

## 2014-08-24 NOTE — Discharge Instructions (Signed)
Near-Syncope Near-syncope (commonly known as near fainting) is sudden weakness, dizziness, or feeling like you might pass out. During an episode of near-syncope, you may also develop pale skin, have tunnel vision, or feel sick to your stomach (nauseous). Near-syncope may occur when getting up after sitting or while standing for a long time. It is caused by a sudden decrease in blood flow to the brain. This decrease can result from various causes or triggers, most of which are not serious. However, because near-syncope can sometimes be a sign of something serious, a medical evaluation is required. The specific cause is often not determined. HOME CARE INSTRUCTIONS  Monitor your condition for any changes. The following actions may help to alleviate any discomfort you are experiencing:  Have someone stay with you until you feel stable.  Lie down right away and prop your feet up if you start feeling like you might faint. Breathe deeply and steadily. Wait until all the symptoms have passed. Most of these episodes last only a few minutes. You may feel tired for several hours.   Drink enough fluids to keep your urine clear or pale yellow.   If you are taking blood pressure or heart medicine, get up slowly when seated or lying down. Take several minutes to sit and then stand. This can reduce dizziness.  Follow up with your health care provider as directed. SEEK IMMEDIATE MEDICAL CARE IF:   You have a severe headache.   You have unusual pain in the chest, abdomen, or back.   You are bleeding from the mouth or rectum, or you have black or tarry stool.   You have an irregular or very fast heartbeat.   You have repeated fainting or have seizure-like jerking during an episode.   You faint when sitting or lying down.   You have confusion.   You have difficulty walking.   You have severe weakness.   You have vision problems.  MAKE SURE YOU:   Understand these instructions.  Will  watch your condition.  Will get help right away if you are not doing well or get worse. Document Released: 04/02/2005 Document Revised: 04/07/2013 Document Reviewed: 09/05/2012 ExitCare Patient Information 2015 [employer]. This information is not intended to replace advice given to you by your health care provider. Make sure you discuss any questions you have with your health care provider.  

## 2014-08-24 NOTE — ED Notes (Signed)
CBG 84. 

## 2014-08-24 NOTE — ED Provider Notes (Signed)
CSN: [id number]     Arrival date & time 08/24/14  1013 History   First MD Initiated Contact with Patient 08/24/14 1019     Chief Complaint  Patient presents with  . Hypotension  . Near Syncope     (Consider location/radiation/quality/duration/timing/severity/associated sxs/prior Treatment) HPI Comments: 64 year old female with near-syncope this morning. Patient reports decreased by mouth intake yesterday and no by mouth intake this morning. She went to work where she began feeling slightly lightheaded and warm. She sat down when she felt lightheaded. Had no true syncopal episode. No chest pain no shortness of breath.  Patient is a 64 y.o. female presenting with near-syncope. The history is provided by the patient.  Near Syncope The current episode started today. The problem occurs rarely. The problem has been resolved. Pertinent negatives include no abdominal pain, chest pain, congestion, fever, rash or weakness. Nothing aggravates the symptoms. She has tried nothing for the symptoms.    Past Medical History  Diagnosis Date  . Chest pain   . Acid reflux   . Fatigue   . Hyperlipidemia   . Arthritis    Past Surgical History  Procedure Laterality Date  . Knee surgery Left     arthroscopic   Family History  Problem Relation Age of Onset  . Adopted: Yes  . Asthma Brother   . Cancer Brother    History  Substance Use Topics  . Smoking status: Former Research scientist (life sciences)  . Smokeless tobacco: Not on file  . Alcohol Use: No   OB History    No data available     Review of Systems  Constitutional: Negative for fever.  HENT: Negative for congestion.   Respiratory: Negative for shortness of breath.   Cardiovascular: Positive for near-syncope. Negative for chest pain.  Gastrointestinal: Negative for abdominal pain.  Skin: Negative for rash.  Neurological: Positive for light-headedness. Negative for weakness.  Psychiatric/Behavioral: Negative for confusion.  All other systems reviewed and  are negative.     Allergies  Codeine  Home Medications   Prior to Admission medications   Medication Sig Start Date End Date Taking? Authorizing Provider  aspirin 81 MG chewable tablet Chew 81 mg by mouth daily.   Yes Historical Provider, MD  aspirin-acetaminophen-caffeine (EXCEDRIN MIGRAINE) [id number] MG per tablet Take 2 tablets by mouth every 6 (six) hours as needed (arthritis).    Yes Historical Provider, MD  Multiple Vitamin (MULTIVITAMIN WITH MINERALS) TABS tablet Take 1 tablet by mouth daily.   Yes Historical Provider, MD  omeprazole (PRILOSEC) 20 MG capsule Take 20 mg by mouth daily.   Yes Historical Provider, MD  sucralfate (CARAFATE) 1 G tablet Take 1 tablet (1 g total) by mouth 4 (four) times daily -  with meals and at bedtime. 08/22/14  Yes Lacretia Leigh, MD   BP 121/78 mmHg  Pulse 73  Temp(Src) 97.7 F (36.5 C) (Oral)  Resp 19  SpO2 100% Physical Exam  Constitutional: She appears well-developed.  HENT:  Head: Normocephalic and atraumatic.  Eyes: Conjunctivae are normal. Pupils are equal, round, and reactive to light.  Neck: Normal range of motion.  Cardiovascular: Normal rate and intact distal pulses.   No murmur heard. Pulmonary/Chest: Effort normal. No respiratory distress.  Abdominal: Soft. She exhibits no distension. There is no tenderness.  Musculoskeletal: Normal range of motion. She exhibits no edema.  Neurological: She is alert.  Skin: Skin is warm.  Psychiatric: She has a normal mood and affect.  Vitals reviewed.   ED Course  EKG  Date/Time: 08/24/2014 12:57 PM Performed by: Louie Flenner Authorized by: Sherwood Gambler Interpreted by ED physician Rhythm: sinus rhythm Rate: normal QRS axis: normal Conduction: conduction normal ST Segments: ST segments normal T Waves: T waves normal Other: no other findings Clinical impression: normal ECG   (including critical care time) Labs Review Labs Reviewed  CBC WITH DIFFERENTIAL/PLATELET - Abnormal;  Notable for the following:    Hemoglobin 11.2 (*)    HCT 34.3 (*)    Neutrophils Relative % 39 (*)    Neutro Abs 1.6 (*)    Lymphocytes Relative 51 (*)    All other components within normal limits  COMPREHENSIVE METABOLIC PANEL - Abnormal; Notable for the following:    Glucose, Bld 105 (*)    Albumin 3.4 (*)    All other components within normal limits  CBG MONITORING, ED    Imaging Review No results found.   EKG Interpretation None      EKG: normal EKG, normal sinus rhythm, normal sinus rhythm.   MDM   64 year old female comes in with near-syncope. Patient was initially reported by EMS to be hypertensive. However on arrival to the department patient noted to have blood pressures greater than 627 systolic. Remainder vitals appropriate. Per EMS they were having difficulties obtaining automatic blood pressures on this patient. This time I will just suspect her brought blood pressure was significantly lowered. Likely technical difficulties with obtaining pressures. Patient denies chest pain shortness breath. Had no true syncopal event. She does report decreased by mouth intake the last 2 days. Patient signed symptoms most consistent with possible hypovolemia or vasovagal type presyncope. Orthostatics negative. CBC showed no anemia. CMP normal. EKG without signs of arrhythmia or ischemia. Patient endorsing any chest pain shortness breath or other concerning for ACS. On reevaluation patient continues to be stable. Is able to ambulate without issue. Appropriate for outpatient management. Discussed return precautions patient was discharged. Recommend increasing by mouth intake.  Final diagnoses:  Near syncope        Robynn Pane, MD 08/24/14 [place], MD 08/29/14 [phone]

## 2014-08-25 DIAGNOSIS — M545 Low back pain: Secondary | ICD-10-CM | POA: Diagnosis not present

## 2014-08-25 DIAGNOSIS — R0789 Other chest pain: Secondary | ICD-10-CM | POA: Diagnosis not present

## 2014-08-25 DIAGNOSIS — R5382 Chronic fatigue, unspecified: Secondary | ICD-10-CM | POA: Diagnosis not present

## 2014-08-25 DIAGNOSIS — K219 Gastro-esophageal reflux disease without esophagitis: Secondary | ICD-10-CM | POA: Diagnosis not present

## 2014-08-31 ENCOUNTER — Encounter: Payer: Self-pay | Admitting: Specialist

## 2014-08-31 ENCOUNTER — Encounter: Payer: Self-pay | Admitting: Internal Medicine

## 2014-09-17 DIAGNOSIS — H40033 Anatomical narrow angle, bilateral: Secondary | ICD-10-CM | POA: Diagnosis not present

## 2014-09-17 DIAGNOSIS — H2513 Age-related nuclear cataract, bilateral: Secondary | ICD-10-CM | POA: Diagnosis not present

## 2014-11-02 ENCOUNTER — Ambulatory Visit (INDEPENDENT_AMBULATORY_CARE_PROVIDER_SITE_OTHER): Payer: Medicare Other | Admitting: Internal Medicine

## 2014-11-02 ENCOUNTER — Encounter: Payer: Self-pay | Admitting: Internal Medicine

## 2014-11-02 VITALS — BP 86/64 | HR 76 | Ht 60.63 in | Wt 105.2 lb

## 2014-11-02 DIAGNOSIS — D649 Anemia, unspecified: Secondary | ICD-10-CM

## 2014-11-02 DIAGNOSIS — R1310 Dysphagia, unspecified: Secondary | ICD-10-CM

## 2014-11-02 DIAGNOSIS — R1319 Other dysphagia: Secondary | ICD-10-CM

## 2014-11-02 DIAGNOSIS — R1314 Dysphagia, pharyngoesophageal phase: Secondary | ICD-10-CM

## 2014-11-02 NOTE — Patient Instructions (Addendum)
  You have been scheduled for an endoscopy. Please follow written instructions given to you at your visit today. If you use inhalers (even only as needed), please bring them with you on the day of your procedure.   We will obtain your lab work from Dr York Ram for Dr. Carlean Purl to review.   I appreciate the opportunity to care for you. Silvano Rusk, MD, [facility]

## 2014-11-02 NOTE — Progress Notes (Addendum)
Referred by York Ram, MD Subjective:    Patient ID: Sarah Moore, female    DOB: 1951-04-03, 64 y.o.   MRN: [id number] Cc: reflux, swallowing difficulty HPI The patient is a pleasant middle-aged African-American woman that had an ER visit for chest pain. In talking to her it sounds like she been having some mild dysphagia. She had an episode where she was presyncopal at work, etc. blood pressure was low but that was not corroborated at the ER. In any rate she ruled out for an MI her EKG was okay. She's been having some heartburn and indigestion though she is better now that she has started a PPI. It does not sound like she has lost weight. In fact she think she is eating more ice cream and gaining weight because of that. He does not have any exertional dyspnea or chest pains. Her legs bother her from arthritis and they limit her some. She said she had a low blood count and she was started on iron, she's had a chronic mild anemia when I look back to the records. I do not have her recent testing with iron studies etc. that she says were done by Dr. Jimmye Norman we will request these. She also has occasional hard stools and is wondering what might that. She says she had a colonoscopy within the last 10 years i.e. less than that and is not due, she had that in [place] did not have any polyps or problems. Allergies  Allergen Reactions  . Codeine Palpitations   Outpatient Prescriptions Prior to Visit  Medication Sig Dispense Refill  . aspirin 81 MG chewable tablet Chew 81 mg by mouth daily.    Marland Kitchen aspirin-acetaminophen-caffeine (EXCEDRIN MIGRAINE) 250-250-65 MG per tablet Take 2 tablets by mouth every 6 (six) hours as needed (arthritis).     . Multiple Vitamin (MULTIVITAMIN WITH MINERALS) TABS tablet Take 1 tablet by mouth daily.    Marland Kitchen omeprazole (PRILOSEC) 20 MG capsule Take 20 mg by mouth daily.    . sucralfate (CARAFATE) 1 G tablet Take 1 tablet (1 g total) by mouth 4 (four) times daily -  with meals  and at bedtime. 30 tablet 0   No facility-administered medications prior to visit.   Past Medical History  Diagnosis Date  . Chest pain   . Acid reflux   . Fatigue   . Hyperlipidemia   . Arthritis   . GERD (gastroesophageal reflux disease)    Past Surgical History  Procedure Laterality Date  . Knee arthroplasty Left   . Abdominal hysterectomy    . Colonoscopy     History   Social History  . Marital Status: Single    Spouse Name: N/A  . Number of Children: 3  . Years of Education: N/A   Occupational History  . retired    Social History Main Topics  . Smoking status: Heavy Tobacco Smoker    Types: Cigarettes  . Smokeless tobacco: Never Used  . Alcohol Use: No  . Drug Use: Not on file  . Sexual Activity: Not on file   Other Topics Concern  . None   Social History Narrative   She says she is single she has 3 sons and one daughter. She works part-time at [employer] but is otherwise retired.11/02/2014      Family History  Problem Relation Age of Onset  . Adopted: Yes  . Asthma Brother   . Stomach cancer Brother   . Clotting disorder Son  blood clot       Review of Systems As per history of present illness all other review of systems are negative.    Objective:   Physical Exam @BP  86/64 mmHg  Pulse 76  Ht 5' 0.63" (1.54 m)  Wt 105 lb 4 oz (47.741 kg)  BMI 20.13 kg/m2@  General:  Well-developed, well-nourished and in no acute distress thin Eyes:  anicteric. ENT:   Mouth and posterior pharynx free of lesions.  Neck:   supple w/o thyromegaly or mass.  Lungs: Clear to auscultation bilaterally. Heart:  S1S2, no rubs, murmurs, gallops. Abdomen:  soft, non-tender, no hepatosplenomegaly, hernia, or mass and BS+.  Lymph:  no cervical or supraclavicular adenopathy. Extremities:   No cyanosis or clubbing Neuro:  A&O x 3.  Psych:  appropriate mood and  Affect.   Data Reviewed: ED notes PCP notes 2016 Labs from 2016 and past    Assessment &  Plan:   1. Esophageal dysphagia   2. Chronic anemia    I have recommended and she has agreed to have an upper GI endoscopy with possible esophageal dilation.The risks and benefits as well as alternatives of endoscopic procedure(s) have been discussed and reviewed. All questions answered. The patient agrees to proceed.  We will obtain records from previous anemia workup she said she had last month. If they are available. If not I will repeat the studies. Her initial contacts suggested the doctor's office she went to is now closed permanently.  Hgg 11.7 and MCV 88.6 NL PLT and WBC 07/29/2014

## 2014-11-09 DIAGNOSIS — M25562 Pain in left knee: Secondary | ICD-10-CM | POA: Diagnosis not present

## 2014-11-09 DIAGNOSIS — M170 Bilateral primary osteoarthritis of knee: Secondary | ICD-10-CM | POA: Diagnosis not present

## 2014-11-09 DIAGNOSIS — R262 Difficulty in walking, not elsewhere classified: Secondary | ICD-10-CM | POA: Diagnosis not present

## 2014-11-09 DIAGNOSIS — M1712 Unilateral primary osteoarthritis, left knee: Secondary | ICD-10-CM | POA: Diagnosis not present

## 2014-11-24 DIAGNOSIS — M1712 Unilateral primary osteoarthritis, left knee: Secondary | ICD-10-CM | POA: Diagnosis not present

## 2014-11-24 DIAGNOSIS — M25562 Pain in left knee: Secondary | ICD-10-CM | POA: Diagnosis not present

## 2014-11-24 DIAGNOSIS — R2689 Other abnormalities of gait and mobility: Secondary | ICD-10-CM | POA: Diagnosis not present

## 2014-12-16 ENCOUNTER — Ambulatory Visit (AMBULATORY_SURGERY_CENTER): Payer: Medicare Other | Admitting: Internal Medicine

## 2014-12-16 ENCOUNTER — Encounter: Payer: Self-pay | Admitting: Internal Medicine

## 2014-12-16 VITALS — BP 135/86 | HR 78 | Temp 98.4°F | Resp 17 | Ht 60.0 in | Wt 105.0 lb

## 2014-12-16 DIAGNOSIS — R1310 Dysphagia, unspecified: Secondary | ICD-10-CM | POA: Diagnosis not present

## 2014-12-16 DIAGNOSIS — R1314 Dysphagia, pharyngoesophageal phase: Secondary | ICD-10-CM | POA: Diagnosis not present

## 2014-12-16 MED ORDER — SODIUM CHLORIDE 0.9 % IV SOLN: 500.0000 mL | INTRAVENOUS | Status: DC

## 2014-12-16 NOTE — Op Note (Signed)
[place]  [address] [place], [address]   ENDOSCOPY PROCEDURE REPORT  PATIENT: Sarah Moore, Sarah Moore  MR#: [id number] BIRTHDATE: April 30, 1950 , 73  yrs. old GENDER: female ENDOSCOPIST: Gatha Mayer, MD, [facility] PROCEDURE DATE:  12/16/2014 PROCEDURE:  EGD, diagnostic and Maloney dilation of esophagus ASA CLASS:     Class I INDICATIONS:  dysphagia. MEDICATIONS: Propofol 100 mg IV and Monitored anesthesia care TOPICAL ANESTHETIC: none  DESCRIPTION OF PROCEDURE: After the risks benefits and alternatives of the procedure were thoroughly explained, informed consent was obtained.  The LB [id number] endoscope was introduced through the mouth and advanced to the second portion of the duodenum , Without limitations.  The instrument was slowly withdrawn as the mucosa was fully examined.    1) ? mild distal esophageal stenosis 2) Otherwise normal EGD. 3) 54 fr Maloney dilator passed, no heme - given dysphagia.  Retroflexed views revealed no abnormalities.     The scope was then withdrawn from the patient and the procedure completed.  COMPLICATIONS: There were no immediate complications.  ENDOSCOPIC IMPRESSION: 1) ? mild distal esophageal stenosis 2) Otherwise normal EGD. 3) 54 fr Maloney dilator passed, no heme - given dysphagia  RECOMMENDATIONS: 1.  Clear liquids until 5 PM, then soft foods rest of day.  Resume prior diet tomorrow. 2.  If fails to get relief from dilation she is to call back and let me know   eSigned:  Gatha Mayer, MD, [facility] 12/16/2014 3:39 PM    CC: The Patient

## 2014-12-16 NOTE — Progress Notes (Signed)
As pt was being rolled out in wheelchair for discharge, she complained of hurting in her throat and seemed to be having trouble getting her breath. Pt brought back into endo.unit and Dr.gessner in and checked pt . Pt given a little more water and pt felt better . Dr Carlean Purl ok'd pt for discharge.

## 2014-12-16 NOTE — Progress Notes (Signed)
Transferred to recovery room. A/O x3, pleased with MAC.  VSS.  Report to Penny, RN. 

## 2014-12-16 NOTE — Progress Notes (Signed)
Called to room to assist during endoscopic procedure.  Patient ID and intended procedure confirmed with present staff. Received instructions for my participation in the procedure from the performing physician.  

## 2014-12-16 NOTE — Patient Instructions (Addendum)
   Nothing bad here - looks normal except perhaps slight narrowing of esophagus. I dilated the esophagus to help you swallow better.  If that fails to help you please call back and leave a message that it did not help.  I appreciate the opportunity to care for you. Gatha Mayer, MD, FACG   YOU HAD AN ENDOSCOPIC PROCEDURE TODAY AT [facility]:   Refer to the procedure report that was given to you for any specific questions about what was found during the examination.  If the procedure report does not answer your questions, please call your gastroenterologist to clarify.  If you requested that your care partner not be given the details of your procedure findings, then the procedure report has been included in a sealed envelope for you to review at your convenience later.  YOU SHOULD EXPECT: Some feelings of bloating in the abdomen. Passage of more gas than usual.  Walking can help get rid of the air that was put into your GI tract during the procedure and reduce the bloating. If you had a lower endoscopy (such as a colonoscopy or flexible sigmoidoscopy) you may notice spotting of blood in your stool or on the toilet paper. If you underwent a bowel prep for your procedure, you may not have a normal bowel movement for a few days.  Please Note:  You might notice some irritation and congestion in your nose or some drainage.  This is from the oxygen used during your procedure.  There is no need for concern and it should clear up in a day or so.  SYMPTOMS TO REPORT IMMEDIATELY:     Following upper endoscopy (EGD)  Vomiting of blood or coffee ground material  New chest pain or pain under the shoulder blades  Painful or persistently difficult swallowing  New shortness of breath  Fever of 100F or higher  Black, tarry-looking stools  For urgent or emergent issues, a gastroenterologist can be reached at any hour by calling [phone].   DIET: follow dilatation diet  today-given to you   ACTIVITY:  You should plan to take it easy for the rest of today and you should NOT DRIVE or use heavy machinery until tomorrow (because of the sedation medicines used during the test).    FOLLOW UP: Our staff will call the number listed on your records the next business day following your procedure to check on you and address any questions or concerns that you may have regarding the information given to you following your procedure. If we do not reach you, we will leave a message.  However, if you are feeling well and you are not experiencing any problems, there is no need to return our call.  We will assume that you have returned to your regular daily activities without incident.  If any biopsies were taken you will be contacted by phone or by letter within the next 1-3 weeks.  Please call us at [phone] if you have not heard about the biopsies in 3 weeks.    SIGNATURES/CONFIDENTIALITY: You and/or your care partner have signed paperwork which will be entered into your electronic medical record.  These signatures attest to the fact that that the information above on your After Visit Summary has been reviewed and is understood.  Full responsibility of the confidentiality of this discharge information lies with you and/or your care-partner.

## 2014-12-17 ENCOUNTER — Telehealth: Payer: Self-pay | Admitting: *Deleted

## 2014-12-17 NOTE — Telephone Encounter (Signed)
  Follow up Call-  Call back number 12/16/2014  Post procedure Call Back phone  # [phone]  Permission to leave phone message Yes     Patient questions:  Do you have a fever, pain , or abdominal swelling? No. Pain Score  0 *  Have you tolerated food without any problems? Yes.    Have you been able to return to your normal activities? Yes.    Do you have any questions about your discharge instructions: Diet   No. Medications  No. Follow up visit  No.  Do you have questions or concerns about your Care? No.  Actions: * If pain score is 4 or above: No action needed, pain <4.

## 2015-03-08 DIAGNOSIS — H04123 Dry eye syndrome of bilateral lacrimal glands: Secondary | ICD-10-CM | POA: Diagnosis not present

## 2015-03-18 DIAGNOSIS — H43393 Other vitreous opacities, bilateral: Secondary | ICD-10-CM | POA: Diagnosis not present

## 2015-05-07 DIAGNOSIS — R5382 Chronic fatigue, unspecified: Secondary | ICD-10-CM | POA: Diagnosis not present

## 2015-05-07 DIAGNOSIS — M722 Plantar fascial fibromatosis: Secondary | ICD-10-CM | POA: Diagnosis not present

## 2015-05-07 DIAGNOSIS — M79671 Pain in right foot: Secondary | ICD-10-CM | POA: Diagnosis not present

## 2015-05-07 DIAGNOSIS — K219 Gastro-esophageal reflux disease without esophagitis: Secondary | ICD-10-CM | POA: Diagnosis not present

## 2015-05-12 DIAGNOSIS — G8929 Other chronic pain: Secondary | ICD-10-CM | POA: Diagnosis not present

## 2015-05-12 DIAGNOSIS — Z79891 Long term (current) use of opiate analgesic: Secondary | ICD-10-CM | POA: Diagnosis not present

## 2015-05-14 DIAGNOSIS — H578 Other specified disorders of eye and adnexa: Secondary | ICD-10-CM | POA: Diagnosis not present

## 2015-05-28 DIAGNOSIS — H04123 Dry eye syndrome of bilateral lacrimal glands: Secondary | ICD-10-CM | POA: Diagnosis not present

## 2015-06-17 DIAGNOSIS — H10023 Other mucopurulent conjunctivitis, bilateral: Secondary | ICD-10-CM | POA: Diagnosis not present

## 2015-06-24 DIAGNOSIS — H578 Other specified disorders of eye and adnexa: Secondary | ICD-10-CM | POA: Diagnosis not present

## 2015-07-02 DIAGNOSIS — H578 Other specified disorders of eye and adnexa: Secondary | ICD-10-CM | POA: Diagnosis not present

## 2015-07-11 DIAGNOSIS — G44219 Episodic tension-type headache, not intractable: Secondary | ICD-10-CM | POA: Diagnosis not present

## 2015-07-14 DIAGNOSIS — H43393 Other vitreous opacities, bilateral: Secondary | ICD-10-CM | POA: Diagnosis not present

## 2015-07-20 DIAGNOSIS — H578 Other specified disorders of eye and adnexa: Secondary | ICD-10-CM | POA: Diagnosis not present

## 2015-07-27 DIAGNOSIS — H1013 Acute atopic conjunctivitis, bilateral: Secondary | ICD-10-CM | POA: Diagnosis not present

## 2015-07-29 DIAGNOSIS — H10013 Acute follicular conjunctivitis, bilateral: Secondary | ICD-10-CM | POA: Diagnosis not present

## 2015-08-05 DIAGNOSIS — H04123 Dry eye syndrome of bilateral lacrimal glands: Secondary | ICD-10-CM | POA: Diagnosis not present

## 2015-08-13 DIAGNOSIS — H10013 Acute follicular conjunctivitis, bilateral: Secondary | ICD-10-CM | POA: Diagnosis not present

## 2015-08-18 DIAGNOSIS — H10013 Acute follicular conjunctivitis, bilateral: Secondary | ICD-10-CM | POA: Diagnosis not present

## 2015-09-02 DIAGNOSIS — H10023 Other mucopurulent conjunctivitis, bilateral: Secondary | ICD-10-CM | POA: Diagnosis not present

## 2015-09-07 DIAGNOSIS — S0502XA Injury of conjunctiva and corneal abrasion without foreign body, left eye, initial encounter: Secondary | ICD-10-CM | POA: Diagnosis not present

## 2015-09-07 DIAGNOSIS — H578 Other specified disorders of eye and adnexa: Secondary | ICD-10-CM | POA: Diagnosis not present

## 2015-09-09 DIAGNOSIS — H578 Other specified disorders of eye and adnexa: Secondary | ICD-10-CM | POA: Diagnosis not present

## 2015-09-16 DIAGNOSIS — H2513 Age-related nuclear cataract, bilateral: Secondary | ICD-10-CM | POA: Diagnosis not present

## 2015-09-16 DIAGNOSIS — H40033 Anatomical narrow angle, bilateral: Secondary | ICD-10-CM | POA: Diagnosis not present

## 2015-09-16 DIAGNOSIS — J209 Acute bronchitis, unspecified: Secondary | ICD-10-CM | POA: Diagnosis not present

## 2015-09-20 ENCOUNTER — Emergency Department (HOSPITAL_COMMUNITY)
Admission: EM | Admit: 2015-09-20 | Discharge: 2015-09-20 | Disposition: A | Discharge status: Home / Self Care | Payer: Medicare Other | Attending: Emergency Medicine | Admitting: Emergency Medicine

## 2015-09-20 ENCOUNTER — Encounter (HOSPITAL_COMMUNITY): Payer: Self-pay | Admitting: Emergency Medicine

## 2015-09-20 ENCOUNTER — Emergency Department (HOSPITAL_COMMUNITY): Payer: Medicare Other

## 2015-09-20 DIAGNOSIS — R0602 Shortness of breath: Secondary | ICD-10-CM | POA: Diagnosis present

## 2015-09-20 DIAGNOSIS — R062 Wheezing: Secondary | ICD-10-CM | POA: Diagnosis not present

## 2015-09-20 DIAGNOSIS — Z792 Long term (current) use of antibiotics: Secondary | ICD-10-CM | POA: Diagnosis not present

## 2015-09-20 DIAGNOSIS — J40 Bronchitis, not specified as acute or chronic: Secondary | ICD-10-CM | POA: Insufficient documentation

## 2015-09-20 DIAGNOSIS — Z7952 Long term (current) use of systemic steroids: Secondary | ICD-10-CM | POA: Insufficient documentation

## 2015-09-20 DIAGNOSIS — Z7982 Long term (current) use of aspirin: Secondary | ICD-10-CM | POA: Insufficient documentation

## 2015-09-20 DIAGNOSIS — F17210 Nicotine dependence, cigarettes, uncomplicated: Secondary | ICD-10-CM | POA: Diagnosis not present

## 2015-09-20 DIAGNOSIS — Z79899 Other long term (current) drug therapy: Secondary | ICD-10-CM | POA: Insufficient documentation

## 2015-09-20 DIAGNOSIS — R079 Chest pain, unspecified: Secondary | ICD-10-CM | POA: Diagnosis not present

## 2015-09-20 LAB — BASIC METABOLIC PANEL
ANION GAP: 9 (ref 5–15)
BUN: 10 mg/dL (ref 6–20)
CALCIUM: 10.1 mg/dL (ref 8.9–10.3)
CO2: 23 mmol/L (ref 22–32)
Chloride: 107 mmol/L (ref 101–111)
Creatinine, Ser: 0.53 mg/dL (ref 0.44–1.00)
GLUCOSE: 97 mg/dL (ref 65–99)
POTASSIUM: 4.3 mmol/L (ref 3.5–5.1)
Sodium: 139 mmol/L (ref 135–145)

## 2015-09-20 LAB — CBC
HCT: 37.1 % (ref 36.0–46.0)
Hemoglobin: 12.3 g/dL (ref 12.0–15.0)
MCH: 29.1 pg (ref 26.0–34.0)
MCHC: 33.2 g/dL (ref 30.0–36.0)
MCV: 87.9 fL (ref 78.0–100.0)
PLATELETS: 233 10*3/uL (ref 150–400)
RBC: 4.22 MIL/uL (ref 3.87–5.11)
RDW: 13.4 % (ref 11.5–15.5)
WBC: 5 10*3/uL (ref 4.0–10.5)

## 2015-09-20 MED ORDER — ALBUTEROL SULFATE (2.5 MG/3ML) 0.083% IN NEBU
5.0000 mg | INHALATION_SOLUTION | Freq: Once | RESPIRATORY_TRACT | Status: AC
  Administered 2015-09-20: 5 mg via RESPIRATORY_TRACT
  Filled 2015-09-20: qty 6

## 2015-09-20 MED ORDER — ALBUTEROL (5 MG/ML) CONTINUOUS INHALATION SOLN
10.0000 mg/h | INHALATION_SOLUTION | Freq: Once | RESPIRATORY_TRACT | Status: AC
  Administered 2015-09-20: 10 mg/h via RESPIRATORY_TRACT
  Filled 2015-09-20: qty 20

## 2015-09-20 MED ORDER — IPRATROPIUM BROMIDE 0.02 % IN SOLN
0.5000 mg | Freq: Once | RESPIRATORY_TRACT | Status: AC
Start: 2015-09-20 — End: 2015-09-20
  Administered 2015-09-20: 0.5 mg via RESPIRATORY_TRACT
  Filled 2015-09-20: qty 2.5

## 2015-09-20 MED ORDER — AEROCHAMBER PLUS FLO-VU MEDIUM MISC
1.0000 | Freq: Once | Status: AC
  Administered 2015-09-20: 1
  Filled 2015-09-20: qty 1

## 2015-09-20 MED ORDER — PREDNISONE 10 MG PO TABS: 40.0000 mg | ORAL_TABLET | Freq: Every day | ORAL | Status: DC

## 2015-09-20 MED ORDER — ALBUTEROL SULFATE HFA 108 (90 BASE) MCG/ACT IN AERS
1.0000 | INHALATION_SPRAY | RESPIRATORY_TRACT | Status: DC | PRN
  Administered 2015-09-20: 2 via RESPIRATORY_TRACT
  Filled 2015-09-20: qty 6.7

## 2015-09-20 MED ORDER — METHYLPREDNISOLONE SODIUM SUCC 125 MG IJ SOLR
125.0000 mg | Freq: Once | INTRAMUSCULAR | Status: AC
  Administered 2015-09-20: 125 mg via INTRAVENOUS
  Filled 2015-09-20: qty 2

## 2015-09-20 NOTE — ED Provider Notes (Signed)
CSN: [id number]     Arrival date & time 09/20/15  1021 History   First MD Initiated Contact with Patient 09/20/15 1032     Chief Complaint  Patient presents with  . Shortness of Breath  . Cough     (Consider location/radiation/quality/duration/timing/severity/associated sxs/prior Treatment) HPI Comments: 65 year old female with history of hyperlipidemia, chronic every day smoker presents for cough. The patient was seen last week at an urgent care facility and diagnosed with bronchitis.  She says that she was prescribed azithromycin and has been taking it without any relief. She says that she feels horrible and that she is coughing incessantly and cannot catch her breath. She denies ever feeling like this before. She does report a history of smoking but says she has not had a cigarette since the symptoms started 5 days ago. Denies fevers or chills. No leg swelling. She says that her chest feels tight but denies any chest pain.  Patient is a 65 y.o. female presenting with shortness of breath and cough.  Shortness of Breath Associated symptoms: cough   Associated symptoms: no abdominal pain, no chest pain, no fever, no headaches, no rash and no vomiting   Cough Associated symptoms: shortness of breath   Associated symptoms: no chest pain, no chills, no fever, no headaches, no myalgias, no rash and no rhinorrhea     Past Medical History  Diagnosis Date  . Chest pain   . Acid reflux   . Fatigue   . Hyperlipidemia   . Arthritis   . GERD (gastroesophageal reflux disease)    Past Surgical History  Procedure Laterality Date  . Knee arthroplasty Left   . Abdominal hysterectomy    . Colonoscopy     Family History  Problem Relation Age of Onset  . Adopted: Yes  . Asthma Brother   . Stomach cancer Brother   . Clotting disorder Son     blood clot   Social History  Substance Use Topics  . Smoking status: Light Tobacco Smoker -- 0.25 packs/day    Types: Cigarettes  . Smokeless  tobacco: Never Used  . Alcohol Use: No   OB History    No data available     Review of Systems  Constitutional: Positive for fatigue. Negative for fever and chills.  HENT: Positive for congestion. Negative for postnasal drip and rhinorrhea.   Eyes: Negative for visual disturbance.  Respiratory: Positive for cough, chest tightness and shortness of breath.   Cardiovascular: Negative for chest pain, palpitations and leg swelling.  Gastrointestinal: Negative for nausea, vomiting and abdominal pain.  Genitourinary: Negative for dysuria, urgency and hematuria.  Musculoskeletal: Negative for myalgias and back pain.  Skin: Negative for rash.  Neurological: Negative for dizziness, weakness, light-headedness and headaches.  Hematological: Does not bruise/bleed easily.      Allergies  Codeine  Home Medications   Prior to Admission medications   Medication Sig Start Date End Date Taking? Authorizing Provider  aspirin EC 81 MG tablet Take 81 mg by mouth daily.   Yes Historical Provider, MD  azithromycin (ZITHROMAX) 250 MG tablet Take 250-500 mg by mouth daily. Started 06/03 for 5 days   Yes Historical Provider, MD  cycloSPORINE (RESTASIS) 0.05 % ophthalmic emulsion Place 1 drop into both eyes 2 (two) times daily as needed (for dry eyes).   Yes Historical Provider, MD  Diphenhydramine-PE-APAP (THERAFLU EXPRESSMAX) 12.5-5-325 MG/15ML LIQD Take 30 mLs by mouth once.   Yes Historical Provider, MD  guaiFENesin (ROBITUSSIN) 100 MG/5ML SOLN Take  5 mLs by mouth every 4 (four) hours as needed for cough or to loosen phlegm.   Yes Historical Provider, MD  lovastatin (MEVACOR) 10 MG tablet Take 1 tablet by mouth at bedtime.  10/19/14  Yes Historical Provider, MD  Multiple Vitamin (MULTIVITAMIN WITH MINERALS) TABS tablet Take 1 tablet by mouth daily.   Yes Historical Provider, MD  omeprazole (PRILOSEC) 20 MG capsule Take 20 mg by mouth daily.   Yes Historical Provider, MD  predniSONE (DELTASONE) 10 MG  tablet Take 4 tablets (40 mg total) by mouth daily. 09/21/15   Harvel Quale, MD  sucralfate (CARAFATE) 1 G tablet Take 1 tablet (1 g total) by mouth 4 (four) times daily -  with meals and at bedtime. Patient not taking: Reported on 09/20/2015 08/22/14   Lacretia Leigh, MD   BP 117/65 mmHg  Pulse 109  Temp(Src) 97.6 F (36.4 C) (Oral)  Resp 19  Ht 5\' 3"  (1.6 m)  Wt 108 lb (48.988 kg)  BMI 19.14 kg/m2  SpO2 100% Physical Exam  Constitutional: She is oriented to person, place, and time. She appears well-developed and well-nourished. No distress.  Appears uncomfortable  HENT:  Head: Normocephalic and atraumatic.  Right Ear: External ear normal.  Left Ear: External ear normal.  Nose: Nose normal.  Mouth/Throat: Oropharynx is clear and moist. No oropharyngeal exudate.  Eyes: EOM are normal. Pupils are equal, round, and reactive to light.  Neck: Normal range of motion. Neck supple.  Cardiovascular: Normal rate, regular rhythm, normal heart sounds and intact distal pulses.   No murmur heard. Pulmonary/Chest: Accessory muscle usage present. Tachypnea noted. No respiratory distress. She has decreased breath sounds. She has wheezes (few scattered). She has no rales.  Abdominal: Soft. She exhibits no distension. There is no tenderness.  Musculoskeletal: Normal range of motion. She exhibits no edema or tenderness.  Neurological: She is alert and oriented to person, place, and time.  Skin: Skin is warm and dry. No rash noted. She is not diaphoretic.  Vitals reviewed.   ED Course  Procedures (including critical care time)  CRITICAL CARE Performed by: Earlie Server   Total critical care time: 35 minutes  Critical care time was exclusive of separately billable procedures and treating other patients.  Critical care was necessary to treat or prevent imminent or life-threatening deterioration.  Critical care was time spent personally by me on the following activities: development of  treatment plan with patient and/or surrogate as well as nursing, discussions with consultants, evaluation of patient's response to treatment, examination of patient, obtaining history from patient or surrogate, ordering and performing treatments and interventions, ordering and review of laboratory studies, ordering and review of radiographic studies, pulse oximetry and re-evaluation of patient's condition.   Labs Review Labs Reviewed  CBC  BASIC METABOLIC PANEL    Imaging Review Dg Chest 2 View  09/20/2015  CLINICAL DATA:  Diagnosed with bronchitis 4 days ago, shortness of breath, wheezing, chest pain, not taking medication this morning, light smoker EXAM: CHEST  2 VIEW COMPARISON:  08/22/2014 FINDINGS: Borderline enlargement of cardiac silhouette. Atherosclerotic calcification aorta. Mediastinal contours and pulmonary vascularity normal. Lungs mildly hyperinflated but clear. No infiltrate, pleural effusion or pneumothorax. Bones unremarkable. IMPRESSION: No acute abnormalities. Electronically Signed   By: Lavonia Dana M.D.   On: 09/20/2015 11:27   I have personally reviewed and evaluated these images and lab results as part of my medical decision-making.   EKG Interpretation None      MDM  Patient was seen and evaluated in stable condition. Laboratory results unremarkable. Chest x-ray without acute process. Patient was given 2 breathing treatments. After the first one she was moving much more air on examination and had increased wheezing. After the second one her examination was significantly improved and she felt significantly better. Patient was given a dose of Solu-Medrol. Patient was discharged home in stable condition with an albuterol inhaler and a prescription for prednisone. Strict return precautions were given. Patient and daughter express understanding and agreement with plan of care. Final diagnoses:  Bronchitis  Wheezing    1. Bronchitis 2. Bronchospasm    Harvel Quale,  MD 09/22/15 [phone]

## 2015-09-20 NOTE — Discharge Instructions (Signed)
You were seen and evaluated today for your cough.  This is secondary to wheezing and bronchitis.  Take the steroids as prescribed.  Use the albuterol inhaler provided to help with the symptoms at home.  Please, follow up with your primary care physician for reevaluation in a few days.  Return with sudden worsening of symptoms.   Acute Bronchitis Bronchitis is inflammation of the airways that extend from the windpipe into the lungs (bronchi). The inflammation often causes mucus to develop. This leads to a cough, which is the most common symptom of bronchitis.  In acute bronchitis, the condition usually develops suddenly and goes away over time, usually in a couple weeks. Smoking, allergies, and asthma can make bronchitis worse. Repeated episodes of bronchitis may cause further lung problems.  CAUSES Acute bronchitis is most often caused by the same virus that causes a cold. The virus can spread from person to person (contagious) through coughing, sneezing, and touching contaminated objects. SIGNS AND SYMPTOMS   Cough.   Fever.   Coughing up mucus.   Body aches.   Chest congestion.   Chills.   Shortness of breath.   Sore throat.  DIAGNOSIS  Acute bronchitis is usually diagnosed through a physical exam. Your health care provider will also ask you questions about your medical history. Tests, such as chest X-rays, are sometimes done to rule out other conditions.  TREATMENT  Acute bronchitis usually goes away in a couple weeks. Oftentimes, no medical treatment is necessary. Medicines are sometimes given for relief of fever or cough. Antibiotic medicines are usually not needed but may be prescribed in certain situations. In some cases, an inhaler may be recommended to help reduce shortness of breath and control the cough. A cool mist vaporizer may also be used to help thin bronchial secretions and make it easier to clear the chest.  HOME CARE INSTRUCTIONS  Get plenty of rest.    Drink enough fluids to keep your urine clear or pale yellow (unless you have a medical condition that requires fluid restriction). Increasing fluids may help thin your respiratory secretions (sputum) and reduce chest congestion, and it will prevent dehydration.   Take medicines only as directed by your health care provider.  If you were prescribed an antibiotic medicine, finish it all even if you start to feel better.  Avoid smoking and secondhand smoke. Exposure to cigarette smoke or irritating chemicals will make bronchitis worse. If you are a smoker, consider using nicotine gum or skin patches to help control withdrawal symptoms. Quitting smoking will help your lungs heal faster.   Reduce the chances of another bout of acute bronchitis by washing your hands frequently, avoiding people with cold symptoms, and trying not to touch your hands to your mouth, nose, or eyes.   Keep all follow-up visits as directed by your health care provider.  SEEK MEDICAL CARE IF: Your symptoms do not improve after 1 week of treatment.  SEEK IMMEDIATE MEDICAL CARE IF:  You develop an increased fever or chills.   You have chest pain.   You have severe shortness of breath.  You have bloody sputum.   You develop dehydration.  You faint or repeatedly feel like you are going to pass out.  You develop repeated vomiting.  You develop a severe headache. MAKE SURE YOU:   Understand these instructions.  Will watch your condition.  Will get help right away if you are not doing well or get worse.   This information is not intended to  replace advice given to you by your health care provider. Make sure you discuss any questions you have with your health care provider.   Document Released: 05/10/2004 Document Revised: 04/23/2014 Document Reviewed: 09/23/2012 Elsevier Interactive Patient Education [employer].

## 2015-09-20 NOTE — ED Notes (Signed)
Patient was diagnosed bronchitis 4 days ago and patient states that she having SOB and "not taking my medications this morning".

## 2015-09-20 NOTE — ED Notes (Signed)
Call placed to respiratory.

## 2015-09-26 DIAGNOSIS — T1512XA Foreign body in conjunctival sac, left eye, initial encounter: Secondary | ICD-10-CM | POA: Diagnosis not present

## 2015-09-26 DIAGNOSIS — H578 Other specified disorders of eye and adnexa: Secondary | ICD-10-CM | POA: Diagnosis not present

## 2015-10-08 DIAGNOSIS — H578 Other specified disorders of eye and adnexa: Secondary | ICD-10-CM | POA: Diagnosis not present

## 2015-10-08 DIAGNOSIS — S0500XA Injury of conjunctiva and corneal abrasion without foreign body, unspecified eye, initial encounter: Secondary | ICD-10-CM | POA: Diagnosis not present

## 2015-10-10 DIAGNOSIS — H578 Other specified disorders of eye and adnexa: Secondary | ICD-10-CM | POA: Diagnosis not present

## 2015-10-13 DIAGNOSIS — H10023 Other mucopurulent conjunctivitis, bilateral: Secondary | ICD-10-CM | POA: Diagnosis not present

## 2015-10-24 DIAGNOSIS — H04123 Dry eye syndrome of bilateral lacrimal glands: Secondary | ICD-10-CM | POA: Diagnosis not present

## 2015-10-28 DIAGNOSIS — G894 Chronic pain syndrome: Secondary | ICD-10-CM | POA: Diagnosis not present

## 2015-11-09 DIAGNOSIS — H10023 Other mucopurulent conjunctivitis, bilateral: Secondary | ICD-10-CM | POA: Diagnosis not present

## 2015-11-15 DIAGNOSIS — H10023 Other mucopurulent conjunctivitis, bilateral: Secondary | ICD-10-CM | POA: Diagnosis not present

## 2015-11-23 IMAGING — CR DG CHEST 2V
2 series · 2 of 2 positions shown · non-contrast
Comparison: 09/07/2008

CLINICAL DATA: Midline chest pain radiating to both arms starting
today. History of acid reflux. Smoker.

EXAM:
CHEST  2 VIEW

[w chest pa]
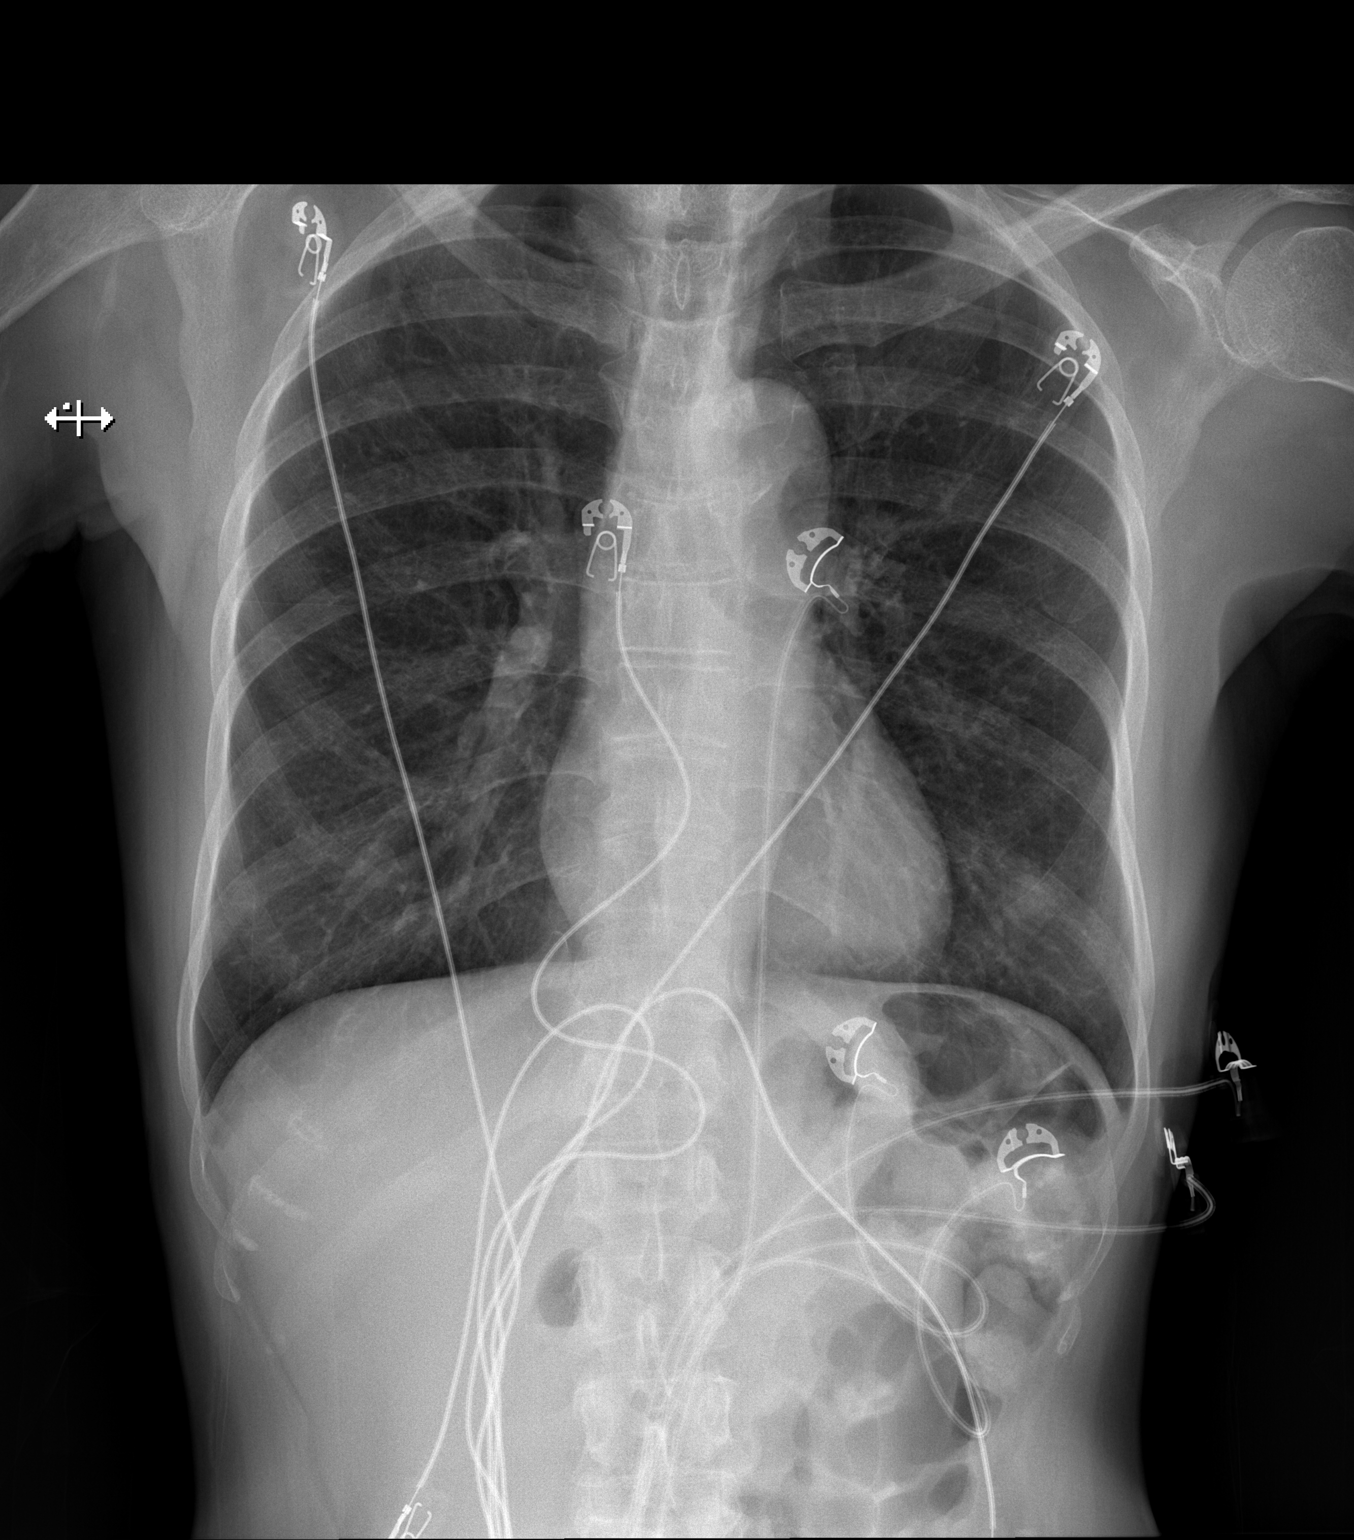

[w chest lat]
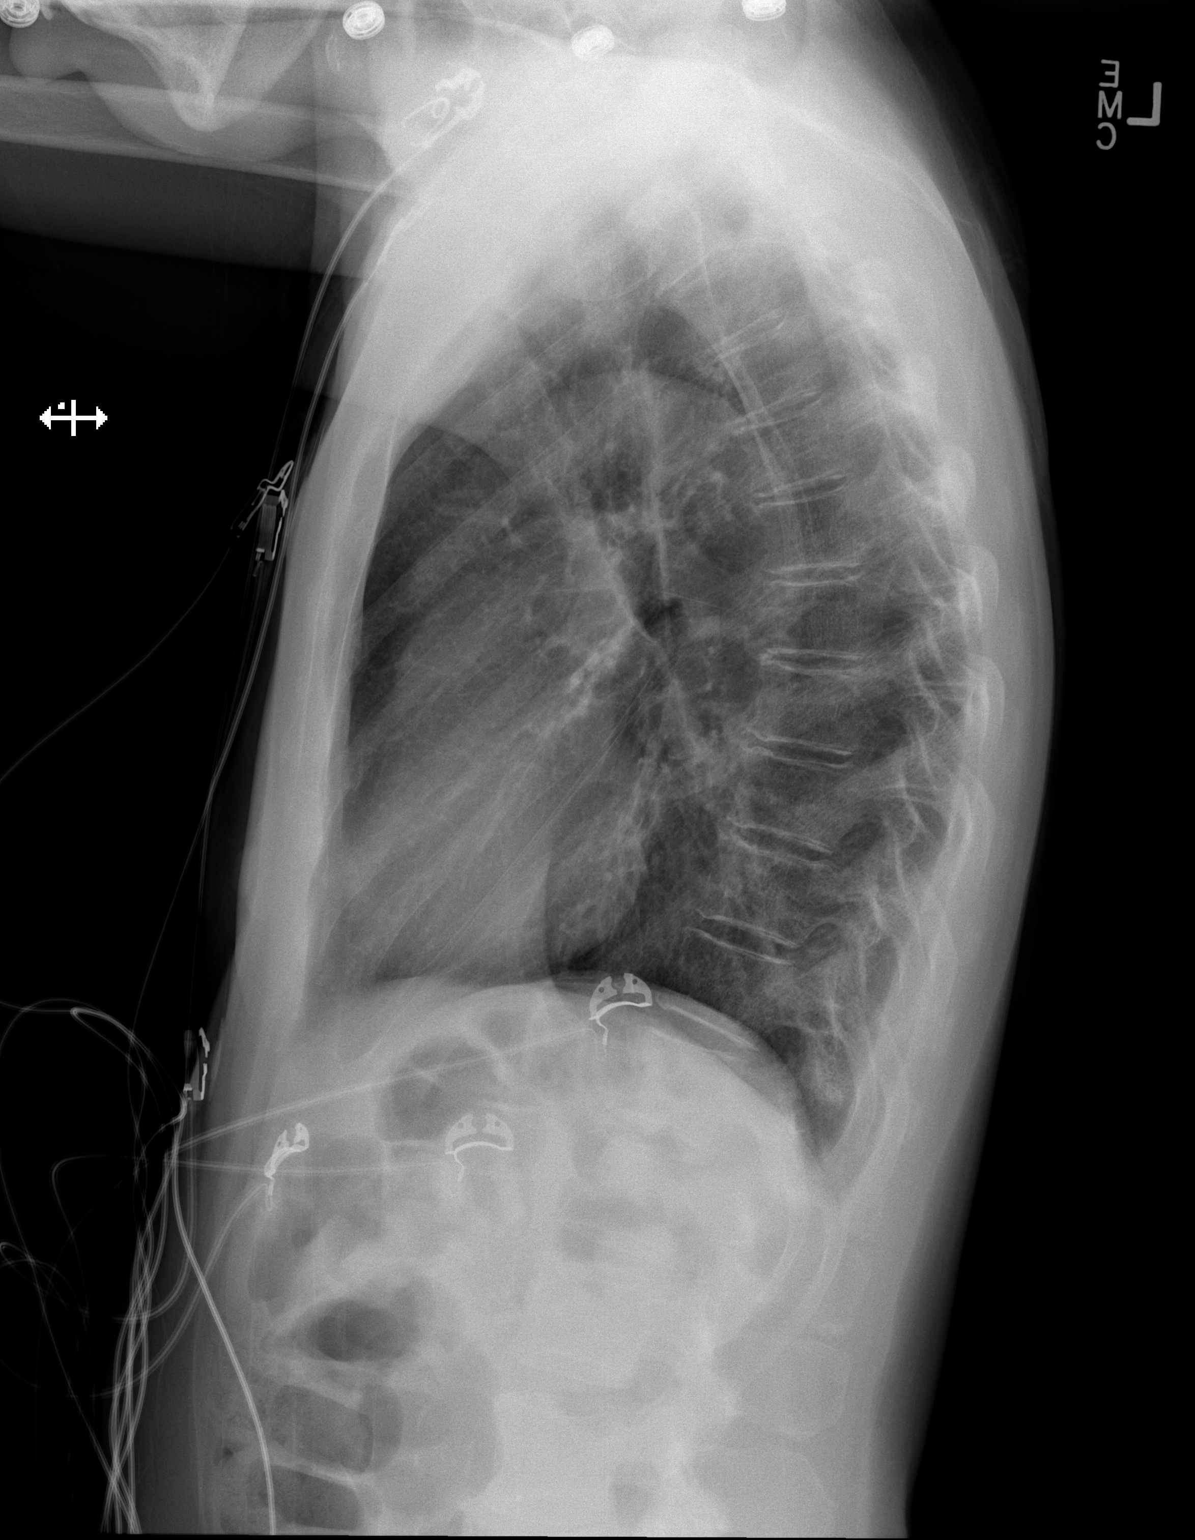

[2 of 2 positions shown; findings below may reference images not displayed]

FINDINGS: Heart, mediastinum hila are within normal limits. Lungs are clear.
No pleural effusion or pneumothorax.

Visualized skeletal structures are unremarkable.
IMPRESSION: No active cardiopulmonary disease.

## 2015-12-06 DIAGNOSIS — H10023 Other mucopurulent conjunctivitis, bilateral: Secondary | ICD-10-CM | POA: Diagnosis not present

## 2015-12-13 DIAGNOSIS — H578 Other specified disorders of eye and adnexa: Secondary | ICD-10-CM | POA: Diagnosis not present

## 2015-12-13 DIAGNOSIS — S0502XA Injury of conjunctiva and corneal abrasion without foreign body, left eye, initial encounter: Secondary | ICD-10-CM | POA: Diagnosis not present

## 2015-12-14 DIAGNOSIS — S0502XA Injury of conjunctiva and corneal abrasion without foreign body, left eye, initial encounter: Secondary | ICD-10-CM | POA: Diagnosis not present

## 2015-12-14 DIAGNOSIS — H578 Other specified disorders of eye and adnexa: Secondary | ICD-10-CM | POA: Diagnosis not present

## 2015-12-16 DIAGNOSIS — H04123 Dry eye syndrome of bilateral lacrimal glands: Secondary | ICD-10-CM | POA: Diagnosis not present

## 2015-12-24 DIAGNOSIS — H578 Other specified disorders of eye and adnexa: Secondary | ICD-10-CM | POA: Diagnosis not present

## 2015-12-24 DIAGNOSIS — S0502XA Injury of conjunctiva and corneal abrasion without foreign body, left eye, initial encounter: Secondary | ICD-10-CM | POA: Diagnosis not present

## 2015-12-26 DIAGNOSIS — H00021 Hordeolum internum right upper eyelid: Secondary | ICD-10-CM | POA: Diagnosis not present

## 2016-01-02 DIAGNOSIS — H00021 Hordeolum internum right upper eyelid: Secondary | ICD-10-CM | POA: Diagnosis not present

## 2016-01-11 DIAGNOSIS — H10023 Other mucopurulent conjunctivitis, bilateral: Secondary | ICD-10-CM | POA: Diagnosis not present

## 2016-01-25 DIAGNOSIS — H10023 Other mucopurulent conjunctivitis, bilateral: Secondary | ICD-10-CM | POA: Diagnosis not present

## 2016-02-02 DIAGNOSIS — H04123 Dry eye syndrome of bilateral lacrimal glands: Secondary | ICD-10-CM | POA: Diagnosis not present

## 2016-02-20 DIAGNOSIS — H10023 Other mucopurulent conjunctivitis, bilateral: Secondary | ICD-10-CM | POA: Diagnosis not present

## 2016-03-01 DIAGNOSIS — H1013 Acute atopic conjunctivitis, bilateral: Secondary | ICD-10-CM | POA: Diagnosis not present

## 2016-03-03 DIAGNOSIS — H578 Other specified disorders of eye and adnexa: Secondary | ICD-10-CM | POA: Diagnosis not present

## 2016-03-10 DIAGNOSIS — S0502XA Injury of conjunctiva and corneal abrasion without foreign body, left eye, initial encounter: Secondary | ICD-10-CM | POA: Diagnosis not present

## 2016-03-10 DIAGNOSIS — H578 Other specified disorders of eye and adnexa: Secondary | ICD-10-CM | POA: Diagnosis not present

## 2016-03-12 DIAGNOSIS — H578 Other specified disorders of eye and adnexa: Secondary | ICD-10-CM | POA: Diagnosis not present

## 2016-03-19 DIAGNOSIS — T1512XA Foreign body in conjunctival sac, left eye, initial encounter: Secondary | ICD-10-CM | POA: Diagnosis not present

## 2016-03-19 DIAGNOSIS — H578 Other specified disorders of eye and adnexa: Secondary | ICD-10-CM | POA: Diagnosis not present

## 2016-03-21 DIAGNOSIS — K219 Gastro-esophageal reflux disease without esophagitis: Secondary | ICD-10-CM | POA: Diagnosis not present

## 2016-03-21 DIAGNOSIS — Z7689 Persons encountering health services in other specified circumstances: Secondary | ICD-10-CM | POA: Diagnosis not present

## 2016-03-21 DIAGNOSIS — M1710 Unilateral primary osteoarthritis, unspecified knee: Secondary | ICD-10-CM | POA: Diagnosis not present

## 2016-04-04 DIAGNOSIS — K219 Gastro-esophageal reflux disease without esophagitis: Secondary | ICD-10-CM | POA: Diagnosis not present

## 2016-04-04 DIAGNOSIS — Z79899 Other long term (current) drug therapy: Secondary | ICD-10-CM | POA: Diagnosis not present

## 2016-04-04 DIAGNOSIS — E559 Vitamin D deficiency, unspecified: Secondary | ICD-10-CM | POA: Diagnosis not present

## 2016-04-04 DIAGNOSIS — M1710 Unilateral primary osteoarthritis, unspecified knee: Secondary | ICD-10-CM | POA: Diagnosis not present

## 2016-04-04 DIAGNOSIS — Z7689 Persons encountering health services in other specified circumstances: Secondary | ICD-10-CM | POA: Diagnosis not present

## 2016-04-06 DIAGNOSIS — H04123 Dry eye syndrome of bilateral lacrimal glands: Secondary | ICD-10-CM | POA: Diagnosis not present

## 2016-04-12 DIAGNOSIS — H578 Other specified disorders of eye and adnexa: Secondary | ICD-10-CM | POA: Diagnosis not present

## 2016-04-12 DIAGNOSIS — S0502XA Injury of conjunctiva and corneal abrasion without foreign body, left eye, initial encounter: Secondary | ICD-10-CM | POA: Diagnosis not present

## 2016-04-13 DIAGNOSIS — H578 Other specified disorders of eye and adnexa: Secondary | ICD-10-CM | POA: Diagnosis not present

## 2016-04-18 DIAGNOSIS — D649 Anemia, unspecified: Secondary | ICD-10-CM | POA: Diagnosis not present

## 2016-04-23 DIAGNOSIS — H578 Other specified disorders of eye and adnexa: Secondary | ICD-10-CM | POA: Diagnosis not present

## 2016-04-23 DIAGNOSIS — S0501XA Injury of conjunctiva and corneal abrasion without foreign body, right eye, initial encounter: Secondary | ICD-10-CM | POA: Diagnosis not present

## 2016-06-15 DIAGNOSIS — Z720 Tobacco use: Secondary | ICD-10-CM | POA: Diagnosis not present

## 2016-06-15 DIAGNOSIS — R030 Elevated blood-pressure reading, without diagnosis of hypertension: Secondary | ICD-10-CM | POA: Diagnosis not present

## 2016-06-15 DIAGNOSIS — M1990 Unspecified osteoarthritis, unspecified site: Secondary | ICD-10-CM | POA: Diagnosis not present

## 2016-06-15 DIAGNOSIS — K219 Gastro-esophageal reflux disease without esophagitis: Secondary | ICD-10-CM | POA: Diagnosis not present

## 2016-06-15 DIAGNOSIS — Z131 Encounter for screening for diabetes mellitus: Secondary | ICD-10-CM | POA: Diagnosis not present

## 2016-07-06 DIAGNOSIS — K219 Gastro-esophageal reflux disease without esophagitis: Secondary | ICD-10-CM | POA: Diagnosis not present

## 2016-07-06 DIAGNOSIS — E559 Vitamin D deficiency, unspecified: Secondary | ICD-10-CM | POA: Diagnosis not present

## 2016-07-06 DIAGNOSIS — R030 Elevated blood-pressure reading, without diagnosis of hypertension: Secondary | ICD-10-CM | POA: Diagnosis not present

## 2016-07-06 DIAGNOSIS — Z720 Tobacco use: Secondary | ICD-10-CM | POA: Diagnosis not present

## 2016-07-06 DIAGNOSIS — M1990 Unspecified osteoarthritis, unspecified site: Secondary | ICD-10-CM | POA: Diagnosis not present

## 2016-07-06 DIAGNOSIS — E785 Hyperlipidemia, unspecified: Secondary | ICD-10-CM | POA: Diagnosis not present

## 2016-08-17 DIAGNOSIS — R030 Elevated blood-pressure reading, without diagnosis of hypertension: Secondary | ICD-10-CM | POA: Diagnosis not present

## 2016-08-17 DIAGNOSIS — Z720 Tobacco use: Secondary | ICD-10-CM | POA: Diagnosis not present

## 2016-08-17 DIAGNOSIS — K219 Gastro-esophageal reflux disease without esophagitis: Secondary | ICD-10-CM | POA: Diagnosis not present

## 2016-08-17 DIAGNOSIS — E785 Hyperlipidemia, unspecified: Secondary | ICD-10-CM | POA: Diagnosis not present

## 2016-08-17 DIAGNOSIS — M1990 Unspecified osteoarthritis, unspecified site: Secondary | ICD-10-CM | POA: Diagnosis not present

## 2016-08-17 DIAGNOSIS — E559 Vitamin D deficiency, unspecified: Secondary | ICD-10-CM | POA: Diagnosis not present

## 2016-08-22 DIAGNOSIS — H1033 Unspecified acute conjunctivitis, bilateral: Secondary | ICD-10-CM | POA: Diagnosis not present

## 2016-08-30 DIAGNOSIS — H1033 Unspecified acute conjunctivitis, bilateral: Secondary | ICD-10-CM | POA: Diagnosis not present

## 2016-12-07 DIAGNOSIS — E785 Hyperlipidemia, unspecified: Secondary | ICD-10-CM | POA: Diagnosis not present

## 2016-12-07 DIAGNOSIS — M1990 Unspecified osteoarthritis, unspecified site: Secondary | ICD-10-CM | POA: Diagnosis not present

## 2016-12-07 DIAGNOSIS — Z87891 Personal history of nicotine dependence: Secondary | ICD-10-CM | POA: Diagnosis not present

## 2016-12-07 DIAGNOSIS — R030 Elevated blood-pressure reading, without diagnosis of hypertension: Secondary | ICD-10-CM | POA: Diagnosis not present

## 2016-12-07 DIAGNOSIS — K219 Gastro-esophageal reflux disease without esophagitis: Secondary | ICD-10-CM | POA: Diagnosis not present

## 2016-12-07 DIAGNOSIS — E559 Vitamin D deficiency, unspecified: Secondary | ICD-10-CM | POA: Diagnosis not present

## 2016-12-21 IMAGING — CR DG CHEST 2V
2 series · 2 of 2 positions shown · non-contrast
Comparison: 08/22/2014

CLINICAL DATA: Diagnosed with bronchitis 4 days ago, shortness of
breath, wheezing, chest pain, not taking medication this morning,
light smoker

EXAM:
CHEST  2 VIEW

[w chest lat]
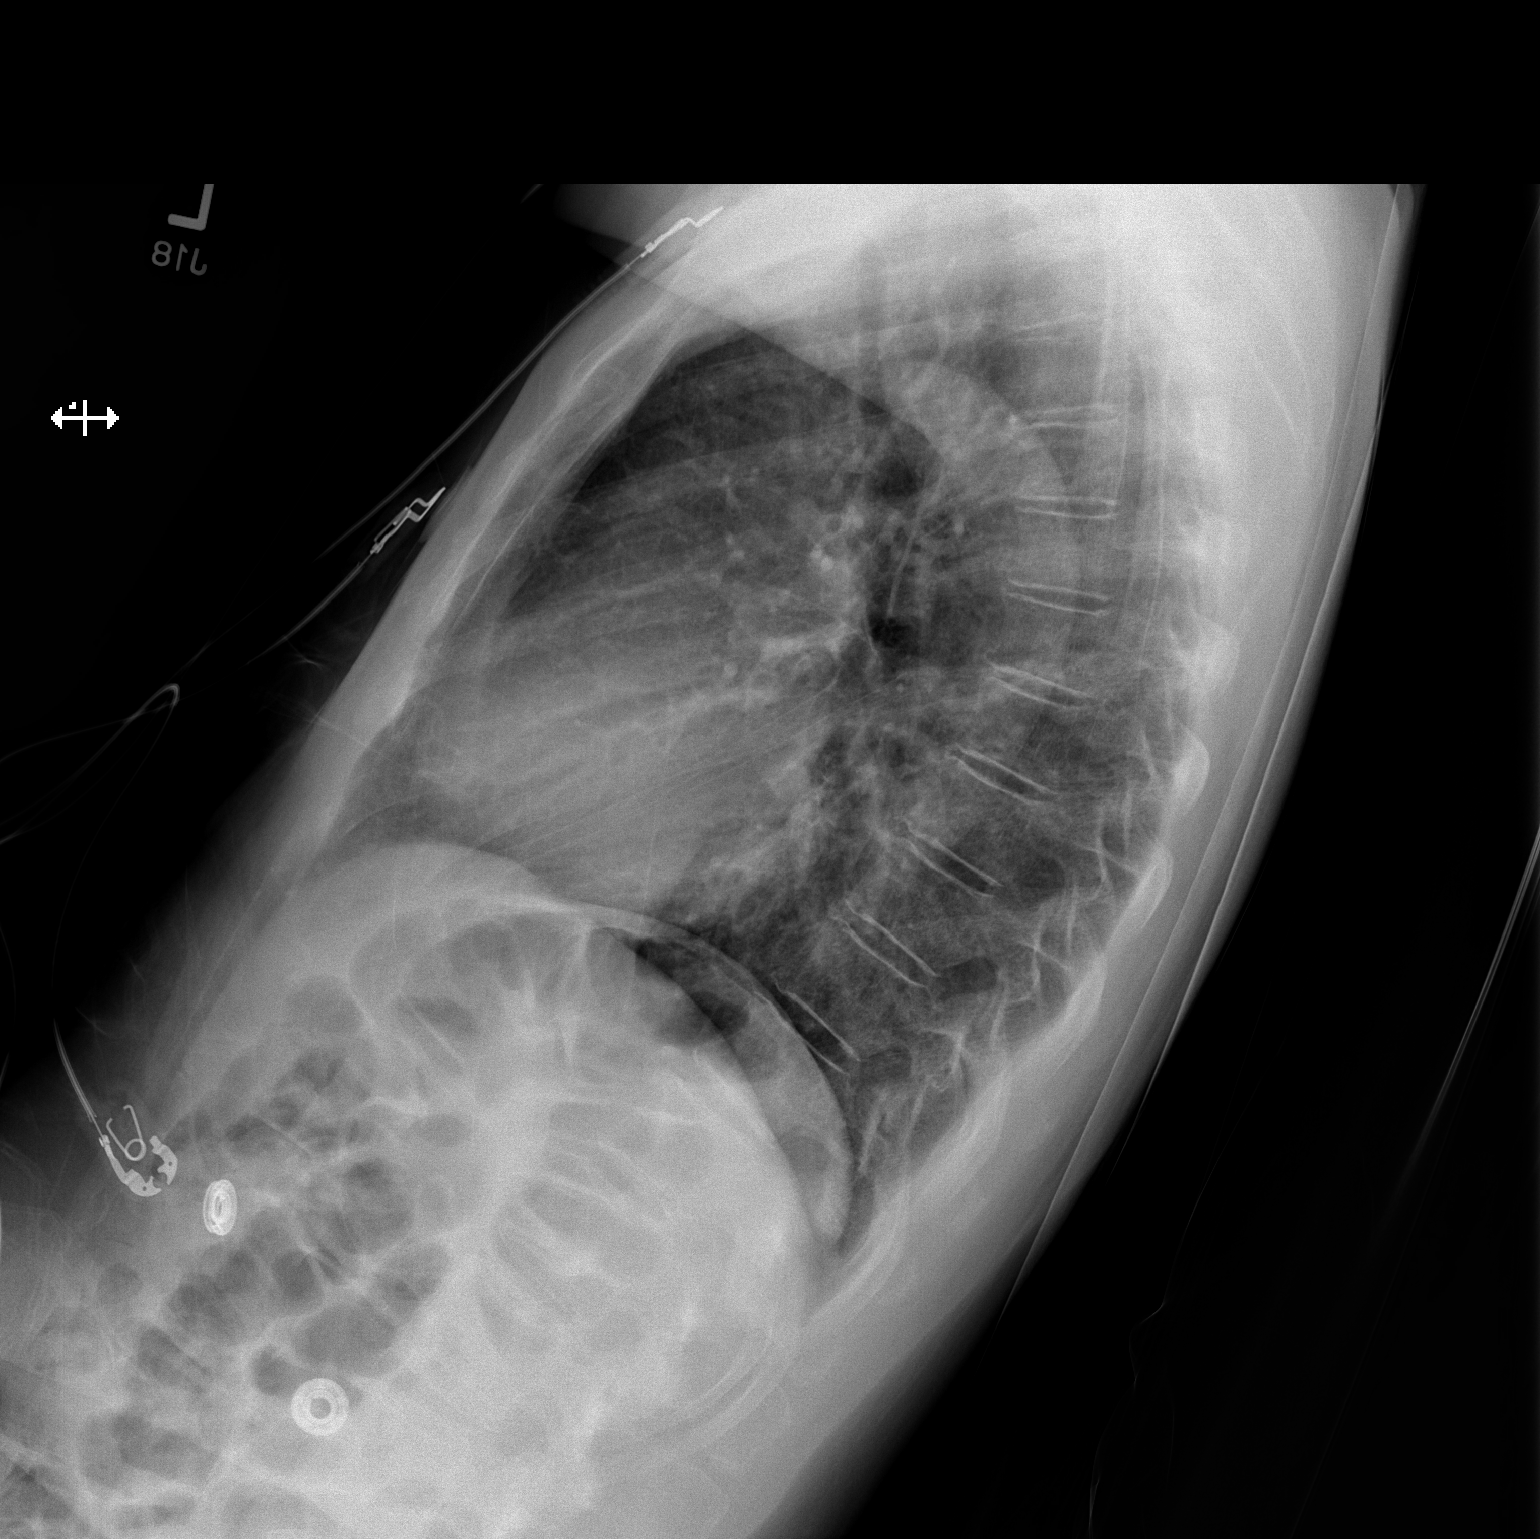

[x chest ap]
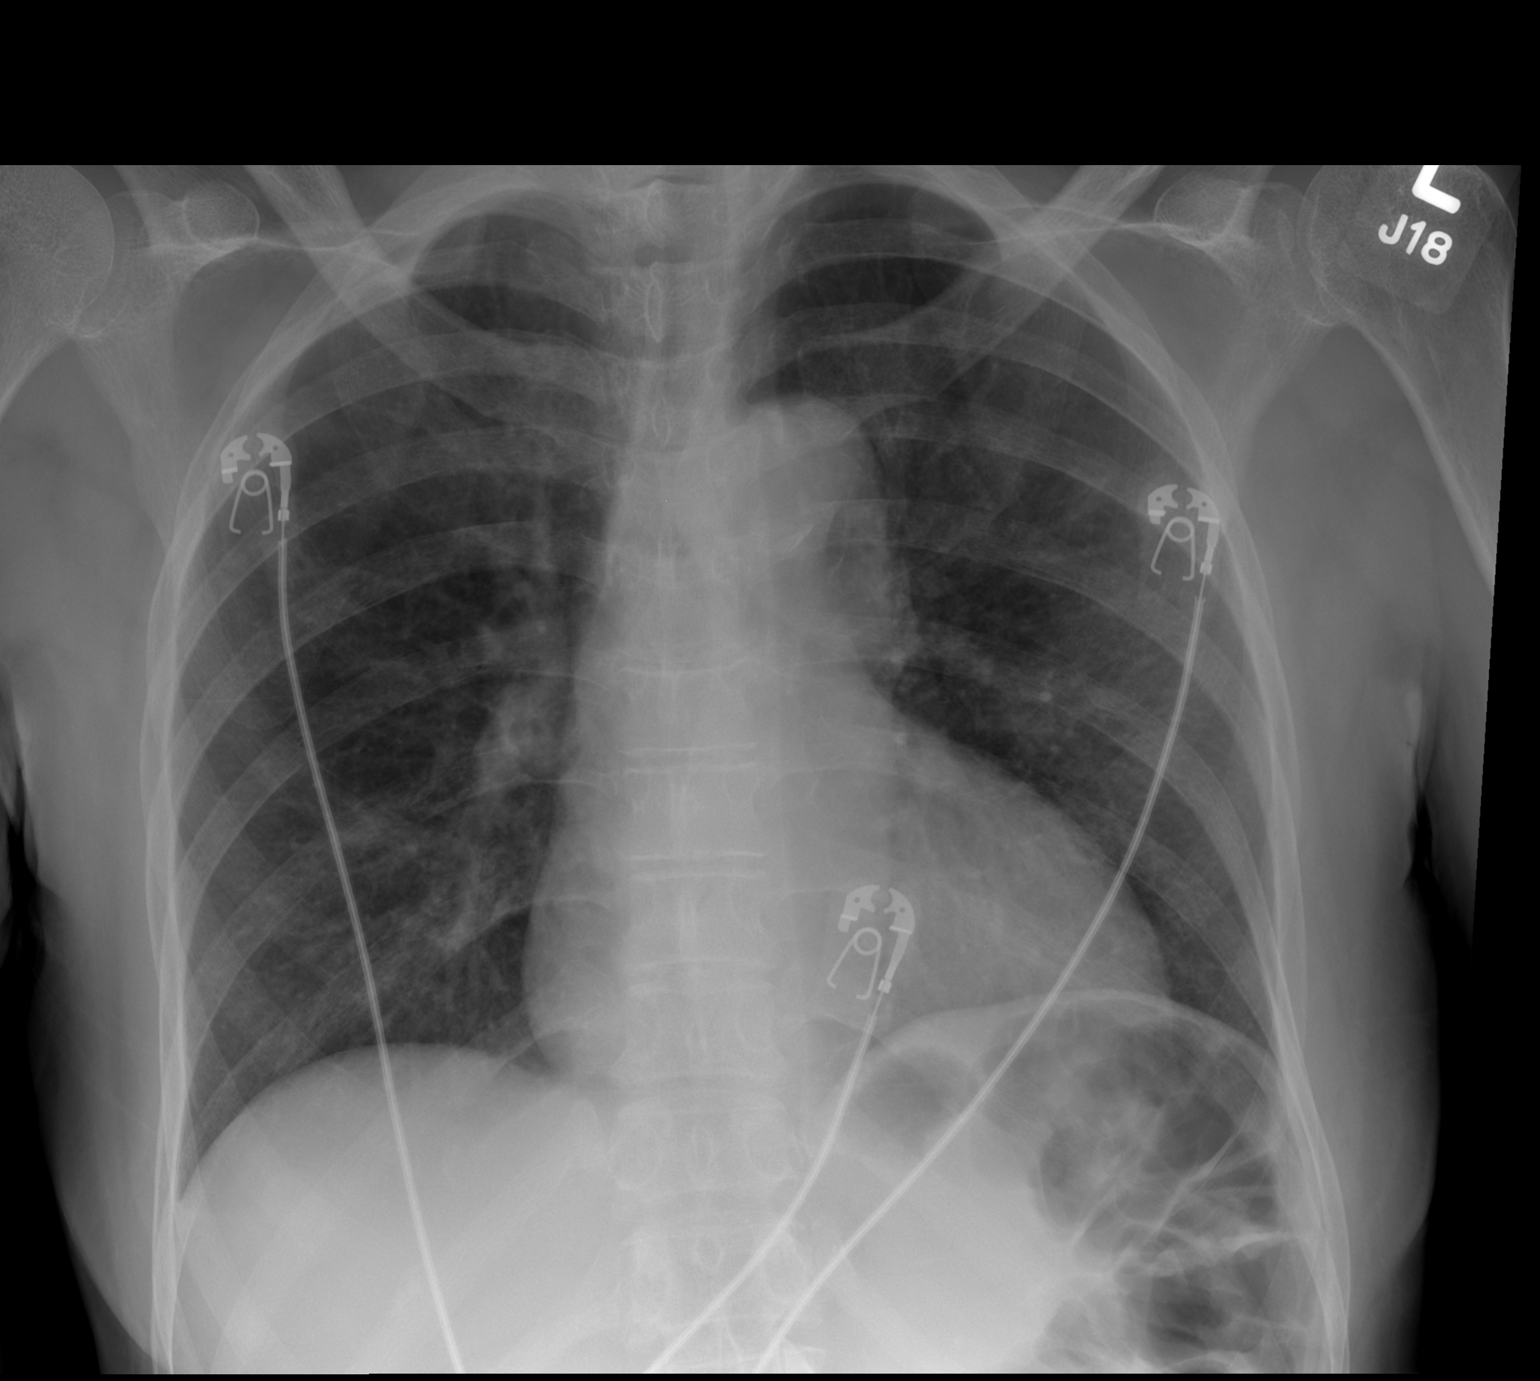

[2 of 2 positions shown; findings below may reference images not displayed]

FINDINGS: Borderline enlargement of cardiac silhouette.

Atherosclerotic calcification aorta.

Mediastinal contours and pulmonary vascularity normal.

Lungs mildly hyperinflated but clear.

No infiltrate, pleural effusion or pneumothorax.

Bones unremarkable.
IMPRESSION: No acute abnormalities.

## 2017-01-18 DIAGNOSIS — Z87891 Personal history of nicotine dependence: Secondary | ICD-10-CM | POA: Diagnosis not present

## 2017-01-18 DIAGNOSIS — E785 Hyperlipidemia, unspecified: Secondary | ICD-10-CM | POA: Diagnosis not present

## 2017-01-18 DIAGNOSIS — K219 Gastro-esophageal reflux disease without esophagitis: Secondary | ICD-10-CM | POA: Diagnosis not present

## 2017-01-18 DIAGNOSIS — R030 Elevated blood-pressure reading, without diagnosis of hypertension: Secondary | ICD-10-CM | POA: Diagnosis not present

## 2017-01-18 DIAGNOSIS — E559 Vitamin D deficiency, unspecified: Secondary | ICD-10-CM | POA: Diagnosis not present

## 2017-01-18 DIAGNOSIS — M1990 Unspecified osteoarthritis, unspecified site: Secondary | ICD-10-CM | POA: Diagnosis not present

## 2017-01-18 DIAGNOSIS — R5383 Other fatigue: Secondary | ICD-10-CM | POA: Diagnosis not present

## 2017-01-18 DIAGNOSIS — Z131 Encounter for screening for diabetes mellitus: Secondary | ICD-10-CM | POA: Diagnosis not present

## 2017-01-18 DIAGNOSIS — T7840XA Allergy, unspecified, initial encounter: Secondary | ICD-10-CM | POA: Diagnosis not present

## 2017-02-06 DIAGNOSIS — R5383 Other fatigue: Secondary | ICD-10-CM | POA: Diagnosis not present

## 2017-02-06 DIAGNOSIS — M1990 Unspecified osteoarthritis, unspecified site: Secondary | ICD-10-CM | POA: Diagnosis not present

## 2017-02-06 DIAGNOSIS — E785 Hyperlipidemia, unspecified: Secondary | ICD-10-CM | POA: Diagnosis not present

## 2017-02-06 DIAGNOSIS — K219 Gastro-esophageal reflux disease without esophagitis: Secondary | ICD-10-CM | POA: Diagnosis not present

## 2017-02-06 DIAGNOSIS — Z87891 Personal history of nicotine dependence: Secondary | ICD-10-CM | POA: Diagnosis not present

## 2017-02-06 DIAGNOSIS — R030 Elevated blood-pressure reading, without diagnosis of hypertension: Secondary | ICD-10-CM | POA: Diagnosis not present

## 2017-02-06 DIAGNOSIS — E559 Vitamin D deficiency, unspecified: Secondary | ICD-10-CM | POA: Diagnosis not present

## 2017-02-24 DIAGNOSIS — H10013 Acute follicular conjunctivitis, bilateral: Secondary | ICD-10-CM | POA: Diagnosis not present

## 2017-03-08 DIAGNOSIS — H10013 Acute follicular conjunctivitis, bilateral: Secondary | ICD-10-CM | POA: Diagnosis not present

## 2017-03-15 DIAGNOSIS — H10023 Other mucopurulent conjunctivitis, bilateral: Secondary | ICD-10-CM | POA: Diagnosis not present

## 2017-04-10 DIAGNOSIS — H10023 Other mucopurulent conjunctivitis, bilateral: Secondary | ICD-10-CM | POA: Diagnosis not present

## 2017-04-22 DIAGNOSIS — R5383 Other fatigue: Secondary | ICD-10-CM | POA: Diagnosis not present

## 2017-04-22 DIAGNOSIS — E559 Vitamin D deficiency, unspecified: Secondary | ICD-10-CM | POA: Diagnosis not present

## 2017-04-22 DIAGNOSIS — E785 Hyperlipidemia, unspecified: Secondary | ICD-10-CM | POA: Diagnosis not present

## 2017-04-22 DIAGNOSIS — M1990 Unspecified osteoarthritis, unspecified site: Secondary | ICD-10-CM | POA: Diagnosis not present

## 2017-04-22 DIAGNOSIS — Z87891 Personal history of nicotine dependence: Secondary | ICD-10-CM | POA: Diagnosis not present

## 2017-04-22 DIAGNOSIS — K219 Gastro-esophageal reflux disease without esophagitis: Secondary | ICD-10-CM | POA: Diagnosis not present

## 2017-04-22 DIAGNOSIS — J069 Acute upper respiratory infection, unspecified: Secondary | ICD-10-CM | POA: Diagnosis not present

## 2017-05-01 DIAGNOSIS — M1990 Unspecified osteoarthritis, unspecified site: Secondary | ICD-10-CM | POA: Diagnosis not present

## 2017-05-01 DIAGNOSIS — E559 Vitamin D deficiency, unspecified: Secondary | ICD-10-CM | POA: Diagnosis not present

## 2017-05-01 DIAGNOSIS — J302 Other seasonal allergic rhinitis: Secondary | ICD-10-CM | POA: Diagnosis not present

## 2017-05-01 DIAGNOSIS — R5383 Other fatigue: Secondary | ICD-10-CM | POA: Diagnosis not present

## 2017-05-01 DIAGNOSIS — K219 Gastro-esophageal reflux disease without esophagitis: Secondary | ICD-10-CM | POA: Diagnosis not present

## 2017-05-01 DIAGNOSIS — Z87891 Personal history of nicotine dependence: Secondary | ICD-10-CM | POA: Diagnosis not present

## 2017-05-01 DIAGNOSIS — E785 Hyperlipidemia, unspecified: Secondary | ICD-10-CM | POA: Diagnosis not present

## 2017-05-09 ENCOUNTER — Encounter (HOSPITAL_BASED_OUTPATIENT_CLINIC_OR_DEPARTMENT_OTHER): Payer: Self-pay

## 2017-05-09 DIAGNOSIS — G4710 Hypersomnia, unspecified: Secondary | ICD-10-CM

## 2017-05-09 DIAGNOSIS — R5383 Other fatigue: Secondary | ICD-10-CM

## 2017-05-24 ENCOUNTER — Ambulatory Visit (HOSPITAL_BASED_OUTPATIENT_CLINIC_OR_DEPARTMENT_OTHER): Payer: Medicare Other | Attending: Physician Assistant | Admitting: Internal Medicine

## 2017-05-24 VITALS — Ht 60.0 in | Wt 109.0 lb

## 2017-05-24 DIAGNOSIS — R0683 Snoring: Secondary | ICD-10-CM | POA: Insufficient documentation

## 2017-05-24 DIAGNOSIS — G4710 Hypersomnia, unspecified: Secondary | ICD-10-CM

## 2017-05-24 DIAGNOSIS — R5383 Other fatigue: Secondary | ICD-10-CM

## 2017-05-24 DIAGNOSIS — G4719 Other hypersomnia: Secondary | ICD-10-CM | POA: Diagnosis not present

## 2017-05-29 DIAGNOSIS — E785 Hyperlipidemia, unspecified: Secondary | ICD-10-CM | POA: Diagnosis not present

## 2017-05-29 DIAGNOSIS — R5383 Other fatigue: Secondary | ICD-10-CM | POA: Diagnosis not present

## 2017-05-29 DIAGNOSIS — J302 Other seasonal allergic rhinitis: Secondary | ICD-10-CM | POA: Diagnosis not present

## 2017-05-29 DIAGNOSIS — Z87891 Personal history of nicotine dependence: Secondary | ICD-10-CM | POA: Diagnosis not present

## 2017-05-29 DIAGNOSIS — M1990 Unspecified osteoarthritis, unspecified site: Secondary | ICD-10-CM | POA: Diagnosis not present

## 2017-05-29 DIAGNOSIS — E559 Vitamin D deficiency, unspecified: Secondary | ICD-10-CM | POA: Diagnosis not present

## 2017-05-29 DIAGNOSIS — K219 Gastro-esophageal reflux disease without esophagitis: Secondary | ICD-10-CM | POA: Diagnosis not present

## 2017-05-30 DIAGNOSIS — J029 Acute pharyngitis, unspecified: Secondary | ICD-10-CM | POA: Diagnosis not present

## 2017-05-31 DIAGNOSIS — H524 Presbyopia: Secondary | ICD-10-CM | POA: Diagnosis not present

## 2017-05-31 DIAGNOSIS — H16223 Keratoconjunctivitis sicca, not specified as Sjogren's, bilateral: Secondary | ICD-10-CM | POA: Diagnosis not present

## 2017-05-31 DIAGNOSIS — H02109 Unspecified ectropion of unspecified eye, unspecified eyelid: Secondary | ICD-10-CM | POA: Diagnosis not present

## 2017-05-31 DIAGNOSIS — H04123 Dry eye syndrome of bilateral lacrimal glands: Secondary | ICD-10-CM | POA: Diagnosis not present

## 2017-06-01 DIAGNOSIS — R5383 Other fatigue: Secondary | ICD-10-CM | POA: Diagnosis not present

## 2017-06-01 NOTE — Procedures (Signed)
   Patient Name: Sarah Moore, Sarah Moore Date: 05/24/2017 Gender: Female D.O.B: 10-09-1950 Age (years): 66 Referring Provider: Raelyn Number Height (inches): 60 Interpreting Physician: Baird Lyons MD, ABSM Weight (lbs): 109 RPSGT: Earney [place] BMI: 21 MRN: [id number] Neck Size: 12.00 <br> <br> CLINICAL INFORMATION Sleep Study Type: NPSG Indication for sleep study: Excessive Daytime Sleepiness, Fatigue  Epworth Sleepiness Score: 2  SLEEP STUDY TECHNIQUE As per the AASM Manual for the Scoring of Sleep and Associated Events v2.3 (April 2016) with a hypopnea requiring 4% desaturations.  The channels recorded and monitored were frontal, central and occipital EEG, electrooculogram (EOG), submentalis EMG (chin), nasal and oral airflow, thoracic and abdominal wall motion, anterior tibialis EMG, snore microphone, electrocardiogram, and pulse oximetry.  MEDICATIONS Medications self-administered by patient taken the night of the study : DICLOFENAC GEL, MELATONIN  SLEEP ARCHITECTURE The study was initiated at 9:56:03 PM and ended at 4:35:29 AM.  Sleep onset time was 45.3 minutes and the sleep efficiency was 62.7%. The total sleep time was 250.6 minutes.  Stage REM latency was 173.5 minutes.  The patient spent 2.59% of the night in stage N1 sleep, 83.84% in stage N2 sleep, 0.00% in stage N3 and 13.57% in REM.  Alpha intrusion was absent.  Supine sleep was 35.31%.  RESPIRATORY PARAMETERS The overall apnea/hypopnea index (AHI) was 0.0 per hour. There were 0 total apneas, including 0 obstructive, 0 central and 0 mixed apneas. There were 0 hypopneas and 0 RERAs.  The AHI during Stage REM sleep was 0.0 per hour.  AHI while supine was 0.0 per hour.  The mean oxygen saturation was 95.32%. The minimum SpO2 during sleep was 93.00%.  soft snoring was noted during this study.  CARDIAC DATA The 2 lead EKG demonstrated sinus rhythm. The mean heart rate was 54.37 beats per minute. Other  EKG findings include: None.  LEG MOVEMENT DATA The total PLMS were 0 with a resulting PLMS index of 0.00. Associated arousal with leg movement index was 0.0 .  IMPRESSIONS - No significant obstructive sleep apnea occurred during this study (AHI = 0.0/h). - No significant central sleep apnea occurred during this study (CAI = 0.0/h). - No oxygen desaturation was noted during this study (Min O2 = 93.00%). - The patient snored with soft snoring volume. - No cardiac abnormalities were noted during this study. - Clinically significant periodic limb movements did not occur during sleep. No significant associated arousals.  DIAGNOSIS - Normal Study  RECOMMENDATIONS  - Sleep hygiene should be reviewed to assess factors that may improve sleep quality. - Weight management and regular exercise should be initiated or continued if appropriate.  [Electronically signed] 06/01/2017 11:42 AM  Baird Lyons MD, ABSM Diplomate, American Board of Sleep Medicine   NPI: [id number]                          [name]  ELECTRONICALLY SIGNED ON:  06/01/2017, 11:37 AM [place] PH: [phone]   FX: [phone] [place]

## 2017-07-04 DIAGNOSIS — H16223 Keratoconjunctivitis sicca, not specified as Sjogren's, bilateral: Secondary | ICD-10-CM | POA: Diagnosis not present

## 2017-07-04 DIAGNOSIS — H524 Presbyopia: Secondary | ICD-10-CM | POA: Diagnosis not present

## 2017-07-04 DIAGNOSIS — H02109 Unspecified ectropion of unspecified eye, unspecified eyelid: Secondary | ICD-10-CM | POA: Diagnosis not present

## 2017-07-04 DIAGNOSIS — H04123 Dry eye syndrome of bilateral lacrimal glands: Secondary | ICD-10-CM | POA: Diagnosis not present

## 2017-07-04 DIAGNOSIS — H02053 Trichiasis without entropian right eye, unspecified eyelid: Secondary | ICD-10-CM | POA: Diagnosis not present

## 2017-07-29 DIAGNOSIS — M1990 Unspecified osteoarthritis, unspecified site: Secondary | ICD-10-CM | POA: Diagnosis not present

## 2017-07-29 DIAGNOSIS — E785 Hyperlipidemia, unspecified: Secondary | ICD-10-CM | POA: Diagnosis not present

## 2017-07-29 DIAGNOSIS — E559 Vitamin D deficiency, unspecified: Secondary | ICD-10-CM | POA: Diagnosis not present

## 2017-07-29 DIAGNOSIS — Z87891 Personal history of nicotine dependence: Secondary | ICD-10-CM | POA: Diagnosis not present

## 2017-07-29 DIAGNOSIS — R202 Paresthesia of skin: Secondary | ICD-10-CM | POA: Diagnosis not present

## 2017-07-29 DIAGNOSIS — J302 Other seasonal allergic rhinitis: Secondary | ICD-10-CM | POA: Diagnosis not present

## 2017-07-29 DIAGNOSIS — R5383 Other fatigue: Secondary | ICD-10-CM | POA: Diagnosis not present

## 2017-07-29 DIAGNOSIS — K219 Gastro-esophageal reflux disease without esophagitis: Secondary | ICD-10-CM | POA: Diagnosis not present

## 2017-08-12 DIAGNOSIS — J302 Other seasonal allergic rhinitis: Secondary | ICD-10-CM | POA: Diagnosis not present

## 2017-08-12 DIAGNOSIS — M1990 Unspecified osteoarthritis, unspecified site: Secondary | ICD-10-CM | POA: Diagnosis not present

## 2017-08-12 DIAGNOSIS — E559 Vitamin D deficiency, unspecified: Secondary | ICD-10-CM | POA: Diagnosis not present

## 2017-08-12 DIAGNOSIS — R5383 Other fatigue: Secondary | ICD-10-CM | POA: Diagnosis not present

## 2017-08-12 DIAGNOSIS — Z87891 Personal history of nicotine dependence: Secondary | ICD-10-CM | POA: Diagnosis not present

## 2017-08-12 DIAGNOSIS — E785 Hyperlipidemia, unspecified: Secondary | ICD-10-CM | POA: Diagnosis not present

## 2017-08-12 DIAGNOSIS — R202 Paresthesia of skin: Secondary | ICD-10-CM | POA: Diagnosis not present

## 2017-08-12 DIAGNOSIS — K219 Gastro-esophageal reflux disease without esophagitis: Secondary | ICD-10-CM | POA: Diagnosis not present

## 2017-08-14 DIAGNOSIS — K219 Gastro-esophageal reflux disease without esophagitis: Secondary | ICD-10-CM | POA: Diagnosis not present

## 2017-08-14 DIAGNOSIS — E785 Hyperlipidemia, unspecified: Secondary | ICD-10-CM | POA: Diagnosis not present

## 2017-09-24 DIAGNOSIS — R69 Illness, unspecified: Secondary | ICD-10-CM | POA: Diagnosis not present

## 2017-09-27 DIAGNOSIS — R202 Paresthesia of skin: Secondary | ICD-10-CM | POA: Diagnosis not present

## 2017-09-27 DIAGNOSIS — J302 Other seasonal allergic rhinitis: Secondary | ICD-10-CM | POA: Diagnosis not present

## 2017-09-27 DIAGNOSIS — R5383 Other fatigue: Secondary | ICD-10-CM | POA: Diagnosis not present

## 2017-09-27 DIAGNOSIS — E785 Hyperlipidemia, unspecified: Secondary | ICD-10-CM | POA: Diagnosis not present

## 2017-09-27 DIAGNOSIS — K219 Gastro-esophageal reflux disease without esophagitis: Secondary | ICD-10-CM | POA: Diagnosis not present

## 2017-09-27 DIAGNOSIS — M1990 Unspecified osteoarthritis, unspecified site: Secondary | ICD-10-CM | POA: Diagnosis not present

## 2017-09-27 DIAGNOSIS — E559 Vitamin D deficiency, unspecified: Secondary | ICD-10-CM | POA: Diagnosis not present

## 2017-09-27 DIAGNOSIS — Z87891 Personal history of nicotine dependence: Secondary | ICD-10-CM | POA: Diagnosis not present

## 2017-10-04 DIAGNOSIS — H2513 Age-related nuclear cataract, bilateral: Secondary | ICD-10-CM | POA: Diagnosis not present

## 2017-10-04 DIAGNOSIS — H16223 Keratoconjunctivitis sicca, not specified as Sjogren's, bilateral: Secondary | ICD-10-CM | POA: Diagnosis not present

## 2017-10-04 DIAGNOSIS — H02053 Trichiasis without entropian right eye, unspecified eyelid: Secondary | ICD-10-CM | POA: Diagnosis not present

## 2017-10-04 DIAGNOSIS — H02109 Unspecified ectropion of unspecified eye, unspecified eyelid: Secondary | ICD-10-CM | POA: Diagnosis not present

## 2017-10-04 DIAGNOSIS — H524 Presbyopia: Secondary | ICD-10-CM | POA: Diagnosis not present

## 2017-10-04 DIAGNOSIS — H04123 Dry eye syndrome of bilateral lacrimal glands: Secondary | ICD-10-CM | POA: Diagnosis not present

## 2017-10-07 DIAGNOSIS — M25561 Pain in right knee: Secondary | ICD-10-CM | POA: Diagnosis not present

## 2017-10-07 DIAGNOSIS — M25562 Pain in left knee: Secondary | ICD-10-CM | POA: Diagnosis not present

## 2017-10-07 DIAGNOSIS — M545 Low back pain: Secondary | ICD-10-CM | POA: Diagnosis not present

## 2017-10-21 DIAGNOSIS — M25562 Pain in left knee: Secondary | ICD-10-CM | POA: Diagnosis not present

## 2017-10-30 DIAGNOSIS — H16223 Keratoconjunctivitis sicca, not specified as Sjogren's, bilateral: Secondary | ICD-10-CM | POA: Diagnosis not present

## 2017-10-30 DIAGNOSIS — H04123 Dry eye syndrome of bilateral lacrimal glands: Secondary | ICD-10-CM | POA: Diagnosis not present

## 2017-10-30 DIAGNOSIS — H2513 Age-related nuclear cataract, bilateral: Secondary | ICD-10-CM | POA: Diagnosis not present

## 2017-10-30 DIAGNOSIS — H02053 Trichiasis without entropian right eye, unspecified eyelid: Secondary | ICD-10-CM | POA: Diagnosis not present

## 2017-10-30 DIAGNOSIS — H02109 Unspecified ectropion of unspecified eye, unspecified eyelid: Secondary | ICD-10-CM | POA: Diagnosis not present

## 2017-11-06 DIAGNOSIS — H16223 Keratoconjunctivitis sicca, not specified as Sjogren's, bilateral: Secondary | ICD-10-CM | POA: Diagnosis not present

## 2017-11-06 DIAGNOSIS — H02109 Unspecified ectropion of unspecified eye, unspecified eyelid: Secondary | ICD-10-CM | POA: Diagnosis not present

## 2017-11-06 DIAGNOSIS — H2513 Age-related nuclear cataract, bilateral: Secondary | ICD-10-CM | POA: Diagnosis not present

## 2017-11-06 DIAGNOSIS — H02053 Trichiasis without entropian right eye, unspecified eyelid: Secondary | ICD-10-CM | POA: Diagnosis not present

## 2017-11-06 DIAGNOSIS — H04123 Dry eye syndrome of bilateral lacrimal glands: Secondary | ICD-10-CM | POA: Diagnosis not present

## 2017-11-11 DIAGNOSIS — R5383 Other fatigue: Secondary | ICD-10-CM | POA: Diagnosis not present

## 2017-11-11 DIAGNOSIS — E785 Hyperlipidemia, unspecified: Secondary | ICD-10-CM | POA: Diagnosis not present

## 2017-11-11 DIAGNOSIS — Z87891 Personal history of nicotine dependence: Secondary | ICD-10-CM | POA: Diagnosis not present

## 2017-11-11 DIAGNOSIS — R202 Paresthesia of skin: Secondary | ICD-10-CM | POA: Diagnosis not present

## 2017-11-11 DIAGNOSIS — M1990 Unspecified osteoarthritis, unspecified site: Secondary | ICD-10-CM | POA: Diagnosis not present

## 2017-11-11 DIAGNOSIS — J302 Other seasonal allergic rhinitis: Secondary | ICD-10-CM | POA: Diagnosis not present

## 2017-11-11 DIAGNOSIS — K219 Gastro-esophageal reflux disease without esophagitis: Secondary | ICD-10-CM | POA: Diagnosis not present

## 2017-11-11 DIAGNOSIS — E559 Vitamin D deficiency, unspecified: Secondary | ICD-10-CM | POA: Diagnosis not present

## 2017-12-12 ENCOUNTER — Other Ambulatory Visit: Payer: Self-pay | Admitting: Physician Assistant

## 2017-12-12 DIAGNOSIS — Z1231 Encounter for screening mammogram for malignant neoplasm of breast: Secondary | ICD-10-CM

## 2017-12-30 DIAGNOSIS — R5383 Other fatigue: Secondary | ICD-10-CM | POA: Diagnosis not present

## 2017-12-30 DIAGNOSIS — J0110 Acute frontal sinusitis, unspecified: Secondary | ICD-10-CM | POA: Diagnosis not present

## 2017-12-30 DIAGNOSIS — R202 Paresthesia of skin: Secondary | ICD-10-CM | POA: Diagnosis not present

## 2017-12-30 DIAGNOSIS — J302 Other seasonal allergic rhinitis: Secondary | ICD-10-CM | POA: Diagnosis not present

## 2017-12-30 DIAGNOSIS — Z87891 Personal history of nicotine dependence: Secondary | ICD-10-CM | POA: Diagnosis not present

## 2017-12-30 DIAGNOSIS — E559 Vitamin D deficiency, unspecified: Secondary | ICD-10-CM | POA: Diagnosis not present

## 2017-12-30 DIAGNOSIS — K219 Gastro-esophageal reflux disease without esophagitis: Secondary | ICD-10-CM | POA: Diagnosis not present

## 2017-12-30 DIAGNOSIS — E785 Hyperlipidemia, unspecified: Secondary | ICD-10-CM | POA: Diagnosis not present

## 2017-12-30 DIAGNOSIS — M1990 Unspecified osteoarthritis, unspecified site: Secondary | ICD-10-CM | POA: Diagnosis not present

## 2018-01-01 DIAGNOSIS — Z1211 Encounter for screening for malignant neoplasm of colon: Secondary | ICD-10-CM | POA: Diagnosis not present

## 2018-01-01 DIAGNOSIS — E785 Hyperlipidemia, unspecified: Secondary | ICD-10-CM | POA: Diagnosis not present

## 2018-01-01 DIAGNOSIS — M1990 Unspecified osteoarthritis, unspecified site: Secondary | ICD-10-CM | POA: Diagnosis not present

## 2018-01-08 DIAGNOSIS — H02109 Unspecified ectropion of unspecified eye, unspecified eyelid: Secondary | ICD-10-CM | POA: Diagnosis not present

## 2018-01-08 DIAGNOSIS — H04123 Dry eye syndrome of bilateral lacrimal glands: Secondary | ICD-10-CM | POA: Diagnosis not present

## 2018-01-08 DIAGNOSIS — H02053 Trichiasis without entropian right eye, unspecified eyelid: Secondary | ICD-10-CM | POA: Diagnosis not present

## 2018-01-08 DIAGNOSIS — H2513 Age-related nuclear cataract, bilateral: Secondary | ICD-10-CM | POA: Diagnosis not present

## 2018-01-08 DIAGNOSIS — H16223 Keratoconjunctivitis sicca, not specified as Sjogren's, bilateral: Secondary | ICD-10-CM | POA: Diagnosis not present

## 2018-01-21 DIAGNOSIS — H25812 Combined forms of age-related cataract, left eye: Secondary | ICD-10-CM | POA: Diagnosis not present

## 2018-01-21 DIAGNOSIS — H25813 Combined forms of age-related cataract, bilateral: Secondary | ICD-10-CM | POA: Diagnosis not present

## 2018-01-21 DIAGNOSIS — Z01818 Encounter for other preprocedural examination: Secondary | ICD-10-CM | POA: Diagnosis not present

## 2018-02-05 ENCOUNTER — Ambulatory Visit: Payer: Medicare Other

## 2018-02-12 DIAGNOSIS — H2512 Age-related nuclear cataract, left eye: Secondary | ICD-10-CM | POA: Diagnosis not present

## 2018-02-12 DIAGNOSIS — H25812 Combined forms of age-related cataract, left eye: Secondary | ICD-10-CM | POA: Diagnosis not present

## 2018-02-26 DIAGNOSIS — H25811 Combined forms of age-related cataract, right eye: Secondary | ICD-10-CM | POA: Diagnosis not present

## 2018-02-26 DIAGNOSIS — H2511 Age-related nuclear cataract, right eye: Secondary | ICD-10-CM | POA: Diagnosis not present

## 2018-02-26 DIAGNOSIS — Z961 Presence of intraocular lens: Secondary | ICD-10-CM | POA: Diagnosis not present

## 2018-03-31 DIAGNOSIS — M1990 Unspecified osteoarthritis, unspecified site: Secondary | ICD-10-CM | POA: Diagnosis not present

## 2018-03-31 DIAGNOSIS — Z0110 Encounter for examination of ears and hearing without abnormal findings: Secondary | ICD-10-CM | POA: Diagnosis not present

## 2018-03-31 DIAGNOSIS — Z136 Encounter for screening for cardiovascular disorders: Secondary | ICD-10-CM | POA: Diagnosis not present

## 2018-03-31 DIAGNOSIS — Z131 Encounter for screening for diabetes mellitus: Secondary | ICD-10-CM | POA: Diagnosis not present

## 2018-03-31 DIAGNOSIS — Z0100 Encounter for examination of eyes and vision without abnormal findings: Secondary | ICD-10-CM | POA: Diagnosis not present

## 2018-03-31 DIAGNOSIS — Z0000 Encounter for general adult medical examination without abnormal findings: Secondary | ICD-10-CM | POA: Diagnosis not present

## 2018-03-31 DIAGNOSIS — R202 Paresthesia of skin: Secondary | ICD-10-CM | POA: Diagnosis not present

## 2018-03-31 DIAGNOSIS — R5383 Other fatigue: Secondary | ICD-10-CM | POA: Diagnosis not present

## 2018-03-31 DIAGNOSIS — K219 Gastro-esophageal reflux disease without esophagitis: Secondary | ICD-10-CM | POA: Diagnosis not present

## 2018-05-20 DIAGNOSIS — R202 Paresthesia of skin: Secondary | ICD-10-CM | POA: Diagnosis not present

## 2018-05-20 DIAGNOSIS — E785 Hyperlipidemia, unspecified: Secondary | ICD-10-CM | POA: Diagnosis not present

## 2018-05-20 DIAGNOSIS — M1990 Unspecified osteoarthritis, unspecified site: Secondary | ICD-10-CM | POA: Diagnosis not present

## 2018-05-20 DIAGNOSIS — K581 Irritable bowel syndrome with constipation: Secondary | ICD-10-CM | POA: Diagnosis not present

## 2018-05-20 DIAGNOSIS — R5383 Other fatigue: Secondary | ICD-10-CM | POA: Diagnosis not present

## 2018-05-20 DIAGNOSIS — K219 Gastro-esophageal reflux disease without esophagitis: Secondary | ICD-10-CM | POA: Diagnosis not present

## 2018-06-25 DIAGNOSIS — H16223 Keratoconjunctivitis sicca, not specified as Sjogren's, bilateral: Secondary | ICD-10-CM | POA: Diagnosis not present

## 2018-06-25 DIAGNOSIS — H25812 Combined forms of age-related cataract, left eye: Secondary | ICD-10-CM | POA: Diagnosis not present

## 2018-06-25 DIAGNOSIS — H02056 Trichiasis without entropian left eye, unspecified eyelid: Secondary | ICD-10-CM | POA: Diagnosis not present

## 2018-06-25 DIAGNOSIS — H25811 Combined forms of age-related cataract, right eye: Secondary | ICD-10-CM | POA: Diagnosis not present

## 2018-06-30 DIAGNOSIS — K219 Gastro-esophageal reflux disease without esophagitis: Secondary | ICD-10-CM | POA: Diagnosis not present

## 2018-06-30 DIAGNOSIS — M1990 Unspecified osteoarthritis, unspecified site: Secondary | ICD-10-CM | POA: Diagnosis not present

## 2018-06-30 DIAGNOSIS — K581 Irritable bowel syndrome with constipation: Secondary | ICD-10-CM | POA: Diagnosis not present

## 2018-06-30 DIAGNOSIS — R5383 Other fatigue: Secondary | ICD-10-CM | POA: Diagnosis not present

## 2018-06-30 DIAGNOSIS — R202 Paresthesia of skin: Secondary | ICD-10-CM | POA: Diagnosis not present

## 2018-06-30 DIAGNOSIS — E785 Hyperlipidemia, unspecified: Secondary | ICD-10-CM | POA: Diagnosis not present

## 2018-08-25 DIAGNOSIS — M1991 Primary osteoarthritis, unspecified site: Secondary | ICD-10-CM | POA: Diagnosis not present

## 2018-08-25 DIAGNOSIS — R202 Paresthesia of skin: Secondary | ICD-10-CM | POA: Diagnosis not present

## 2018-08-25 DIAGNOSIS — K581 Irritable bowel syndrome with constipation: Secondary | ICD-10-CM | POA: Diagnosis not present

## 2018-08-25 DIAGNOSIS — Z131 Encounter for screening for diabetes mellitus: Secondary | ICD-10-CM | POA: Diagnosis not present

## 2018-08-25 DIAGNOSIS — K219 Gastro-esophageal reflux disease without esophagitis: Secondary | ICD-10-CM | POA: Diagnosis not present

## 2018-08-25 DIAGNOSIS — M1990 Unspecified osteoarthritis, unspecified site: Secondary | ICD-10-CM | POA: Diagnosis not present

## 2018-08-25 DIAGNOSIS — R5383 Other fatigue: Secondary | ICD-10-CM | POA: Diagnosis not present

## 2018-08-25 DIAGNOSIS — E785 Hyperlipidemia, unspecified: Secondary | ICD-10-CM | POA: Diagnosis not present

## 2018-08-25 DIAGNOSIS — R42 Dizziness and giddiness: Secondary | ICD-10-CM | POA: Diagnosis not present

## 2018-08-25 DIAGNOSIS — Z1389 Encounter for screening for other disorder: Secondary | ICD-10-CM | POA: Diagnosis not present

## 2018-09-01 DIAGNOSIS — M1990 Unspecified osteoarthritis, unspecified site: Secondary | ICD-10-CM | POA: Diagnosis not present

## 2018-09-01 DIAGNOSIS — H1013 Acute atopic conjunctivitis, bilateral: Secondary | ICD-10-CM | POA: Diagnosis not present

## 2018-09-01 DIAGNOSIS — R5383 Other fatigue: Secondary | ICD-10-CM | POA: Diagnosis not present

## 2018-09-01 DIAGNOSIS — K219 Gastro-esophageal reflux disease without esophagitis: Secondary | ICD-10-CM | POA: Diagnosis not present

## 2018-09-01 DIAGNOSIS — Z131 Encounter for screening for diabetes mellitus: Secondary | ICD-10-CM | POA: Diagnosis not present

## 2018-09-01 DIAGNOSIS — Z0101 Encounter for examination of eyes and vision with abnormal findings: Secondary | ICD-10-CM | POA: Diagnosis not present

## 2018-09-01 DIAGNOSIS — R202 Paresthesia of skin: Secondary | ICD-10-CM | POA: Diagnosis not present

## 2018-09-01 DIAGNOSIS — E785 Hyperlipidemia, unspecified: Secondary | ICD-10-CM | POA: Diagnosis not present

## 2018-09-01 DIAGNOSIS — Z0000 Encounter for general adult medical examination without abnormal findings: Secondary | ICD-10-CM | POA: Diagnosis not present

## 2018-09-02 ENCOUNTER — Telehealth: Payer: Self-pay | Admitting: *Deleted

## 2018-09-02 NOTE — Telephone Encounter (Signed)
REFERRAL SENT TO SCHEDULING AND NOTES REQUESTED. FROM DR. Benito Mccreedy [phone].

## 2018-09-29 DIAGNOSIS — M1990 Unspecified osteoarthritis, unspecified site: Secondary | ICD-10-CM | POA: Diagnosis not present

## 2018-09-29 DIAGNOSIS — K219 Gastro-esophageal reflux disease without esophagitis: Secondary | ICD-10-CM | POA: Diagnosis not present

## 2018-09-29 DIAGNOSIS — E785 Hyperlipidemia, unspecified: Secondary | ICD-10-CM | POA: Diagnosis not present

## 2018-09-29 DIAGNOSIS — R5383 Other fatigue: Secondary | ICD-10-CM | POA: Diagnosis not present

## 2018-09-29 DIAGNOSIS — R202 Paresthesia of skin: Secondary | ICD-10-CM | POA: Diagnosis not present

## 2018-09-29 DIAGNOSIS — K581 Irritable bowel syndrome with constipation: Secondary | ICD-10-CM | POA: Diagnosis not present

## 2018-09-29 DIAGNOSIS — H1013 Acute atopic conjunctivitis, bilateral: Secondary | ICD-10-CM | POA: Diagnosis not present

## 2018-10-13 DIAGNOSIS — K581 Irritable bowel syndrome with constipation: Secondary | ICD-10-CM | POA: Diagnosis not present

## 2018-10-13 DIAGNOSIS — E785 Hyperlipidemia, unspecified: Secondary | ICD-10-CM | POA: Diagnosis not present

## 2018-10-13 DIAGNOSIS — R202 Paresthesia of skin: Secondary | ICD-10-CM | POA: Diagnosis not present

## 2018-10-13 DIAGNOSIS — K219 Gastro-esophageal reflux disease without esophagitis: Secondary | ICD-10-CM | POA: Diagnosis not present

## 2018-10-13 DIAGNOSIS — Z0001 Encounter for general adult medical examination with abnormal findings: Secondary | ICD-10-CM | POA: Diagnosis not present

## 2018-10-13 DIAGNOSIS — R5383 Other fatigue: Secondary | ICD-10-CM | POA: Diagnosis not present

## 2018-10-13 DIAGNOSIS — M1990 Unspecified osteoarthritis, unspecified site: Secondary | ICD-10-CM | POA: Diagnosis not present

## 2018-10-13 DIAGNOSIS — H1013 Acute atopic conjunctivitis, bilateral: Secondary | ICD-10-CM | POA: Diagnosis not present

## 2018-11-10 DIAGNOSIS — M1990 Unspecified osteoarthritis, unspecified site: Secondary | ICD-10-CM | POA: Diagnosis not present

## 2018-11-10 DIAGNOSIS — H1013 Acute atopic conjunctivitis, bilateral: Secondary | ICD-10-CM | POA: Diagnosis not present

## 2018-11-10 DIAGNOSIS — E785 Hyperlipidemia, unspecified: Secondary | ICD-10-CM | POA: Diagnosis not present

## 2018-11-10 DIAGNOSIS — K581 Irritable bowel syndrome with constipation: Secondary | ICD-10-CM | POA: Diagnosis not present

## 2018-11-10 DIAGNOSIS — R5383 Other fatigue: Secondary | ICD-10-CM | POA: Diagnosis not present

## 2018-11-10 DIAGNOSIS — L03211 Cellulitis of face: Secondary | ICD-10-CM | POA: Diagnosis not present

## 2018-11-10 DIAGNOSIS — K219 Gastro-esophageal reflux disease without esophagitis: Secondary | ICD-10-CM | POA: Diagnosis not present

## 2018-11-10 DIAGNOSIS — R202 Paresthesia of skin: Secondary | ICD-10-CM | POA: Diagnosis not present

## 2018-12-10 ENCOUNTER — Ambulatory Visit: Payer: Medicare HMO | Admitting: Interventional Cardiology

## 2018-12-29 DIAGNOSIS — K581 Irritable bowel syndrome with constipation: Secondary | ICD-10-CM | POA: Diagnosis not present

## 2018-12-29 DIAGNOSIS — H1013 Acute atopic conjunctivitis, bilateral: Secondary | ICD-10-CM | POA: Diagnosis not present

## 2018-12-29 DIAGNOSIS — R202 Paresthesia of skin: Secondary | ICD-10-CM | POA: Diagnosis not present

## 2018-12-29 DIAGNOSIS — R5383 Other fatigue: Secondary | ICD-10-CM | POA: Diagnosis not present

## 2018-12-29 DIAGNOSIS — M1990 Unspecified osteoarthritis, unspecified site: Secondary | ICD-10-CM | POA: Diagnosis not present

## 2018-12-29 DIAGNOSIS — K219 Gastro-esophageal reflux disease without esophagitis: Secondary | ICD-10-CM | POA: Diagnosis not present

## 2018-12-29 DIAGNOSIS — E785 Hyperlipidemia, unspecified: Secondary | ICD-10-CM | POA: Diagnosis not present

## 2019-01-05 DIAGNOSIS — H16223 Keratoconjunctivitis sicca, not specified as Sjogren's, bilateral: Secondary | ICD-10-CM | POA: Diagnosis not present

## 2019-02-16 ENCOUNTER — Ambulatory Visit: Payer: Medicare HMO | Admitting: Interventional Cardiology

## 2019-02-16 ENCOUNTER — Encounter: Payer: Self-pay | Admitting: General Practice

## 2019-02-16 DIAGNOSIS — K219 Gastro-esophageal reflux disease without esophagitis: Secondary | ICD-10-CM | POA: Diagnosis not present

## 2019-02-16 DIAGNOSIS — K581 Irritable bowel syndrome with constipation: Secondary | ICD-10-CM | POA: Diagnosis not present

## 2019-02-16 DIAGNOSIS — E785 Hyperlipidemia, unspecified: Secondary | ICD-10-CM | POA: Diagnosis not present

## 2019-02-16 DIAGNOSIS — R5383 Other fatigue: Secondary | ICD-10-CM | POA: Diagnosis not present

## 2019-02-16 DIAGNOSIS — Z23 Encounter for immunization: Secondary | ICD-10-CM | POA: Diagnosis not present

## 2019-02-16 DIAGNOSIS — H1013 Acute atopic conjunctivitis, bilateral: Secondary | ICD-10-CM | POA: Diagnosis not present

## 2019-02-16 DIAGNOSIS — M1990 Unspecified osteoarthritis, unspecified site: Secondary | ICD-10-CM | POA: Diagnosis not present

## 2019-02-16 DIAGNOSIS — R202 Paresthesia of skin: Secondary | ICD-10-CM | POA: Diagnosis not present

## 2019-03-18 DIAGNOSIS — E782 Mixed hyperlipidemia: Secondary | ICD-10-CM | POA: Diagnosis not present

## 2019-03-18 DIAGNOSIS — I10 Essential (primary) hypertension: Secondary | ICD-10-CM | POA: Diagnosis not present

## 2019-03-18 DIAGNOSIS — Z1211 Encounter for screening for malignant neoplasm of colon: Secondary | ICD-10-CM | POA: Diagnosis not present

## 2019-03-26 DIAGNOSIS — Z1211 Encounter for screening for malignant neoplasm of colon: Secondary | ICD-10-CM | POA: Diagnosis not present

## 2019-03-26 DIAGNOSIS — D120 Benign neoplasm of cecum: Secondary | ICD-10-CM | POA: Diagnosis not present

## 2019-03-26 DIAGNOSIS — D175 Benign lipomatous neoplasm of intra-abdominal organs: Secondary | ICD-10-CM | POA: Diagnosis not present

## 2019-03-26 DIAGNOSIS — D123 Benign neoplasm of transverse colon: Secondary | ICD-10-CM | POA: Diagnosis not present

## 2019-03-26 DIAGNOSIS — K635 Polyp of colon: Secondary | ICD-10-CM | POA: Diagnosis not present

## 2019-04-03 DIAGNOSIS — K581 Irritable bowel syndrome with constipation: Secondary | ICD-10-CM | POA: Diagnosis not present

## 2019-04-03 DIAGNOSIS — R5383 Other fatigue: Secondary | ICD-10-CM | POA: Diagnosis not present

## 2019-04-03 DIAGNOSIS — R202 Paresthesia of skin: Secondary | ICD-10-CM | POA: Diagnosis not present

## 2019-04-03 DIAGNOSIS — Z720 Tobacco use: Secondary | ICD-10-CM | POA: Diagnosis not present

## 2019-04-03 DIAGNOSIS — M1990 Unspecified osteoarthritis, unspecified site: Secondary | ICD-10-CM | POA: Diagnosis not present

## 2019-04-03 DIAGNOSIS — K219 Gastro-esophageal reflux disease without esophagitis: Secondary | ICD-10-CM | POA: Diagnosis not present

## 2019-04-03 DIAGNOSIS — E785 Hyperlipidemia, unspecified: Secondary | ICD-10-CM | POA: Diagnosis not present

## 2019-04-03 DIAGNOSIS — H1013 Acute atopic conjunctivitis, bilateral: Secondary | ICD-10-CM | POA: Diagnosis not present

## 2019-04-03 DIAGNOSIS — D229 Melanocytic nevi, unspecified: Secondary | ICD-10-CM | POA: Diagnosis not present

## 2019-04-28 DIAGNOSIS — Z20828 Contact with and (suspected) exposure to other viral communicable diseases: Secondary | ICD-10-CM | POA: Diagnosis not present

## 2019-05-15 DIAGNOSIS — Z20822 Contact with and (suspected) exposure to covid-19: Secondary | ICD-10-CM | POA: Diagnosis not present

## 2019-05-15 DIAGNOSIS — Z20828 Contact with and (suspected) exposure to other viral communicable diseases: Secondary | ICD-10-CM | POA: Diagnosis not present

## 2019-06-08 DIAGNOSIS — D485 Neoplasm of uncertain behavior of skin: Secondary | ICD-10-CM | POA: Diagnosis not present

## 2019-06-08 DIAGNOSIS — C44729 Squamous cell carcinoma of skin of left lower limb, including hip: Secondary | ICD-10-CM | POA: Diagnosis not present

## 2019-06-16 DIAGNOSIS — K581 Irritable bowel syndrome with constipation: Secondary | ICD-10-CM | POA: Diagnosis not present

## 2019-06-16 DIAGNOSIS — R202 Paresthesia of skin: Secondary | ICD-10-CM | POA: Diagnosis not present

## 2019-06-16 DIAGNOSIS — M1990 Unspecified osteoarthritis, unspecified site: Secondary | ICD-10-CM | POA: Diagnosis not present

## 2019-06-16 DIAGNOSIS — Z720 Tobacco use: Secondary | ICD-10-CM | POA: Diagnosis not present

## 2019-06-16 DIAGNOSIS — K219 Gastro-esophageal reflux disease without esophagitis: Secondary | ICD-10-CM | POA: Diagnosis not present

## 2019-06-16 DIAGNOSIS — R5383 Other fatigue: Secondary | ICD-10-CM | POA: Diagnosis not present

## 2019-06-16 DIAGNOSIS — E785 Hyperlipidemia, unspecified: Secondary | ICD-10-CM | POA: Diagnosis not present

## 2019-06-16 DIAGNOSIS — D229 Melanocytic nevi, unspecified: Secondary | ICD-10-CM | POA: Diagnosis not present

## 2019-06-16 DIAGNOSIS — H1013 Acute atopic conjunctivitis, bilateral: Secondary | ICD-10-CM | POA: Diagnosis not present

## 2019-07-10 DIAGNOSIS — L905 Scar conditions and fibrosis of skin: Secondary | ICD-10-CM | POA: Diagnosis not present

## 2019-07-10 DIAGNOSIS — C44729 Squamous cell carcinoma of skin of left lower limb, including hip: Secondary | ICD-10-CM | POA: Diagnosis not present

## 2019-08-05 DIAGNOSIS — Z85828 Personal history of other malignant neoplasm of skin: Secondary | ICD-10-CM | POA: Diagnosis not present

## 2019-08-05 DIAGNOSIS — L905 Scar conditions and fibrosis of skin: Secondary | ICD-10-CM | POA: Diagnosis not present

## 2019-08-05 DIAGNOSIS — D225 Melanocytic nevi of trunk: Secondary | ICD-10-CM | POA: Diagnosis not present

## 2019-08-05 DIAGNOSIS — L821 Other seborrheic keratosis: Secondary | ICD-10-CM | POA: Diagnosis not present

## 2019-09-01 DIAGNOSIS — Z1321 Encounter for screening for nutritional disorder: Secondary | ICD-10-CM | POA: Diagnosis not present

## 2019-09-01 DIAGNOSIS — Z01118 Encounter for examination of ears and hearing with other abnormal findings: Secondary | ICD-10-CM | POA: Diagnosis not present

## 2019-09-01 DIAGNOSIS — Z0001 Encounter for general adult medical examination with abnormal findings: Secondary | ICD-10-CM | POA: Diagnosis not present

## 2019-09-01 DIAGNOSIS — M1990 Unspecified osteoarthritis, unspecified site: Secondary | ICD-10-CM | POA: Diagnosis not present

## 2019-09-01 DIAGNOSIS — K219 Gastro-esophageal reflux disease without esophagitis: Secondary | ICD-10-CM | POA: Diagnosis not present

## 2019-09-01 DIAGNOSIS — K581 Irritable bowel syndrome with constipation: Secondary | ICD-10-CM | POA: Diagnosis not present

## 2019-09-01 DIAGNOSIS — Z1329 Encounter for screening for other suspected endocrine disorder: Secondary | ICD-10-CM | POA: Diagnosis not present

## 2019-09-01 DIAGNOSIS — E782 Mixed hyperlipidemia: Secondary | ICD-10-CM | POA: Diagnosis not present

## 2019-09-01 DIAGNOSIS — Z01021 Encounter for examination of eyes and vision following failed vision screening with abnormal findings: Secondary | ICD-10-CM | POA: Diagnosis not present

## 2019-09-01 DIAGNOSIS — Z136 Encounter for screening for cardiovascular disorders: Secondary | ICD-10-CM | POA: Diagnosis not present

## 2019-09-01 DIAGNOSIS — Z131 Encounter for screening for diabetes mellitus: Secondary | ICD-10-CM | POA: Diagnosis not present

## 2019-09-24 DIAGNOSIS — M1990 Unspecified osteoarthritis, unspecified site: Secondary | ICD-10-CM | POA: Diagnosis not present

## 2019-09-24 DIAGNOSIS — K219 Gastro-esophageal reflux disease without esophagitis: Secondary | ICD-10-CM | POA: Diagnosis not present

## 2019-09-24 DIAGNOSIS — Z720 Tobacco use: Secondary | ICD-10-CM | POA: Diagnosis not present

## 2019-09-24 DIAGNOSIS — E782 Mixed hyperlipidemia: Secondary | ICD-10-CM | POA: Diagnosis not present

## 2019-09-24 DIAGNOSIS — K581 Irritable bowel syndrome with constipation: Secondary | ICD-10-CM | POA: Diagnosis not present

## 2019-10-14 DIAGNOSIS — E782 Mixed hyperlipidemia: Secondary | ICD-10-CM | POA: Diagnosis not present

## 2019-10-14 DIAGNOSIS — K581 Irritable bowel syndrome with constipation: Secondary | ICD-10-CM | POA: Diagnosis not present

## 2019-10-14 DIAGNOSIS — Z0001 Encounter for general adult medical examination with abnormal findings: Secondary | ICD-10-CM | POA: Diagnosis not present

## 2019-10-14 DIAGNOSIS — M1990 Unspecified osteoarthritis, unspecified site: Secondary | ICD-10-CM | POA: Diagnosis not present

## 2019-10-14 DIAGNOSIS — Z720 Tobacco use: Secondary | ICD-10-CM | POA: Diagnosis not present

## 2019-10-14 DIAGNOSIS — K219 Gastro-esophageal reflux disease without esophagitis: Secondary | ICD-10-CM | POA: Diagnosis not present

## 2019-12-17 ENCOUNTER — Emergency Department (HOSPITAL_BASED_OUTPATIENT_CLINIC_OR_DEPARTMENT_OTHER)
Admission: EM | Admit: 2019-12-17 | Discharge: 2019-12-17 | Disposition: A | Discharge status: Home / Self Care | Payer: Medicare HMO | Attending: Emergency Medicine | Admitting: Emergency Medicine

## 2019-12-17 ENCOUNTER — Encounter (HOSPITAL_BASED_OUTPATIENT_CLINIC_OR_DEPARTMENT_OTHER): Payer: Self-pay | Admitting: *Deleted

## 2019-12-17 ENCOUNTER — Other Ambulatory Visit: Payer: Self-pay

## 2019-12-17 ENCOUNTER — Emergency Department (HOSPITAL_BASED_OUTPATIENT_CLINIC_OR_DEPARTMENT_OTHER): Payer: Medicare HMO

## 2019-12-17 DIAGNOSIS — R079 Chest pain, unspecified: Secondary | ICD-10-CM | POA: Diagnosis not present

## 2019-12-17 DIAGNOSIS — R0789 Other chest pain: Secondary | ICD-10-CM | POA: Diagnosis not present

## 2019-12-17 DIAGNOSIS — Z79899 Other long term (current) drug therapy: Secondary | ICD-10-CM | POA: Insufficient documentation

## 2019-12-17 DIAGNOSIS — J029 Acute pharyngitis, unspecified: Secondary | ICD-10-CM | POA: Insufficient documentation

## 2019-12-17 DIAGNOSIS — Z96652 Presence of left artificial knee joint: Secondary | ICD-10-CM | POA: Diagnosis not present

## 2019-12-17 DIAGNOSIS — Z87891 Personal history of nicotine dependence: Secondary | ICD-10-CM | POA: Diagnosis not present

## 2019-12-17 DIAGNOSIS — Z7982 Long term (current) use of aspirin: Secondary | ICD-10-CM | POA: Insufficient documentation

## 2019-12-17 DIAGNOSIS — R1310 Dysphagia, unspecified: Secondary | ICD-10-CM | POA: Insufficient documentation

## 2019-12-17 LAB — CBC WITH DIFFERENTIAL/PLATELET
Abs Immature Granulocytes: 0.01 10*3/uL (ref 0.00–0.07)
Basophils Absolute: 0 10*3/uL (ref 0.0–0.1)
Basophils Relative: 0 %
Eosinophils Absolute: 0 10*3/uL (ref 0.0–0.5)
Eosinophils Relative: 1 %
HCT: 35.1 % — ABNORMAL LOW (ref 36.0–46.0)
Hemoglobin: 11.4 g/dL — ABNORMAL LOW (ref 12.0–15.0)
Immature Granulocytes: 0 %
Lymphocytes Relative: 49 %
Lymphs Abs: 3 10*3/uL (ref 0.7–4.0)
MCH: 29.1 pg (ref 26.0–34.0)
MCHC: 32.5 g/dL (ref 30.0–36.0)
MCV: 89.5 fL (ref 80.0–100.0)
Monocytes Absolute: 0.4 10*3/uL (ref 0.1–1.0)
Monocytes Relative: 7 %
Neutro Abs: 2.6 10*3/uL (ref 1.7–7.7)
Neutrophils Relative %: 43 %
Platelets: 246 10*3/uL (ref 150–400)
RBC: 3.92 MIL/uL (ref 3.87–5.11)
RDW: 13.9 % (ref 11.5–15.5)
WBC: 6 10*3/uL (ref 4.0–10.5)
nRBC: 0 % (ref 0.0–0.2)

## 2019-12-17 LAB — COMPREHENSIVE METABOLIC PANEL
ALT: 19 U/L (ref 0–44)
AST: 25 U/L (ref 15–41)
Albumin: 3.7 g/dL (ref 3.5–5.0)
Alkaline Phosphatase: 55 U/L (ref 38–126)
Anion gap: 13 (ref 5–15)
BUN: 7 mg/dL — ABNORMAL LOW (ref 8–23)
CO2: 24 mmol/L (ref 22–32)
Calcium: 9.6 mg/dL (ref 8.9–10.3)
Chloride: 99 mmol/L (ref 98–111)
Creatinine, Ser: 0.47 mg/dL (ref 0.44–1.00)
GFR calc Af Amer: >60 mL/min (ref >60)
GFR calc non Af Amer: >60 mL/min (ref >60)
Glucose, Bld: 98 mg/dL (ref 70–99)
Potassium: 3.2 mmol/L — ABNORMAL LOW (ref 3.5–5.1)
Sodium: 136 mmol/L (ref 135–145)
Total Bilirubin: 1.2 mg/dL (ref 0.3–1.2)
Total Protein: 6.9 g/dL (ref 6.5–8.1)

## 2019-12-17 LAB — TROPONIN I (HIGH SENSITIVITY)
Troponin I (High Sensitivity): 2 ng/L (ref <18)
Troponin I (High Sensitivity): 3 ng/L (ref <18)

## 2019-12-17 LAB — LIPASE, BLOOD: Lipase: 21 U/L (ref 11–51)

## 2019-12-17 MED ORDER — ALUM & MAG HYDROXIDE-SIMETH 200-200-20 MG/5ML PO SUSP
30.0000 mL | Freq: Once | ORAL | Status: AC
  Administered 2019-12-17: 30 mL via ORAL
  Filled 2019-12-17: qty 30

## 2019-12-17 MED ORDER — ASPIRIN 81 MG PO CHEW
324.0000 mg | CHEWABLE_TABLET | Freq: Once | ORAL | Status: AC
  Administered 2019-12-17: 324 mg via ORAL
  Filled 2019-12-17: qty 4

## 2019-12-17 MED ORDER — SUCRALFATE 1 GM/10ML PO SUSP
1.0000 g | Freq: Once | ORAL | Status: AC
  Administered 2019-12-17: 1 g via ORAL
  Filled 2019-12-17: qty 10

## 2019-12-17 MED ORDER — SUCRALFATE 1 GM/10ML PO SUSP: 1.0000 g | Freq: Three times a day (TID) | ORAL | 0 refills | Status: DC

## 2019-12-17 MED ORDER — PANTOPRAZOLE SODIUM 20 MG PO TBEC: 20.0000 mg | DELAYED_RELEASE_TABLET | Freq: Every day | ORAL | 0 refills | Status: DC

## 2019-12-17 MED ORDER — PANTOPRAZOLE SODIUM 40 MG IV SOLR
40.0000 mg | Freq: Once | INTRAVENOUS | Status: AC
  Administered 2019-12-17: 40 mg via INTRAVENOUS
  Filled 2019-12-17: qty 40

## 2019-12-17 MED ORDER — FAMOTIDINE 20 MG PO TABS
20.0000 mg | ORAL_TABLET | Freq: Once | ORAL | Status: AC
  Administered 2019-12-17: 20 mg via ORAL
  Filled 2019-12-17: qty 1

## 2019-12-17 MED ORDER — POTASSIUM CHLORIDE CRYS ER 20 MEQ PO TBCR
40.0000 meq | EXTENDED_RELEASE_TABLET | Freq: Once | ORAL | Status: AC
  Administered 2019-12-17: 40 meq via ORAL
  Filled 2019-12-17: qty 2

## 2019-12-17 MED ORDER — LIDOCAINE VISCOUS HCL 2 % MT SOLN
15.0000 mL | Freq: Once | OROMUCOSAL | Status: AC
Start: 2019-12-17 — End: 2019-12-17
  Administered 2019-12-17: 15 mL via ORAL
  Filled 2019-12-17: qty 15

## 2019-12-17 NOTE — ED Notes (Signed)
Pt. Reports she needed to burp last night and she drank a hot coke and was able to.  After drinking the hot coke she felt better.  Pt. Reports she came to ER because she felt like her throat was closed but not now.

## 2019-12-17 NOTE — ED Notes (Signed)
Pt. Walked to restroom on her own.

## 2019-12-17 NOTE — ED Provider Notes (Addendum)
[facility] Provider Note   CSN: [id number] Arrival date & time: 12/17/19  1340     History Chief Complaint  Patient presents with  . Chest Pain    Sarah Moore is a 69 y.o. female.  HPI    Pt is a 69 y/o female with a h/o acid reflux, distal esophageal stenosis s/p esophageal dilation, arthritis, chest pain, hld, who presents to the ED for eval of chest pain that started last night. Describes pain as a tightness located to the center of her chest and was nonradiating. Pain last night was rated 8-9/10. States she drank some hot cocoa and burped and it improved her symptoms. She now has some mild tightness rated 6-7/10 and that she still needs to burp. Last night she had some associated nausea and mild SOB (which she states is chronic).  Denies vomiting, diaphoresis. Denies cough, hemoptysis, pleuritic pain, BLE swelling, abd pain.   EGD 2016 reviewed -  1) ? mild distal esophageal stenosis 2) Otherwise normal EGD. 3) 54 fr Maloney dilator passed, no heme - given dysphagia  Past Medical History:  Diagnosis Date  . Acid reflux   . Arthritis   . Chest pain   . Fatigue   . GERD (gastroesophageal reflux disease)   . Hyperlipidemia     Patient Active Problem List   Diagnosis Date Noted  . Chest pain 08/10/2014  . CONSTIPATION, CHRONIC 02/18/2007  . OSTEOARTHRITIS 02/18/2007  . DVT, HX OF 02/18/2007  . ARTHROSCOPY, KNEE, HX OF 02/18/2007    Past Surgical History:  Procedure Laterality Date  . ABDOMINAL HYSTERECTOMY    . COLONOSCOPY    . KNEE ARTHROPLASTY Left      OB History   No obstetric history on file.     Family History  Adopted: Yes  Problem Relation Age of Onset  . Asthma Brother   . Stomach cancer Brother   . Clotting disorder Son        blood clot    Social History   Tobacco Use  . Smoking status: Former Smoker    Packs/day: 0.25    Types: Cigarettes  . Smokeless tobacco: Never Used  Substance Use Topics  .  Alcohol use: No    Alcohol/week: 0.0 standard drinks  . Drug use: No    Home Medications Prior to Admission medications   Medication Sig Start Date End Date Taking? Authorizing Provider  aspirin EC 81 MG tablet Take 81 mg by mouth daily.   Yes [provider]  buPROPion (WELLBUTRIN XL) 150 MG 24 hr tablet  09/29/19  Yes [provider]  cycloSPORINE (RESTASIS) 0.05 % ophthalmic emulsion Place 1 drop into both eyes 2 (two) times daily as needed (for dry eyes).   Yes [provider]  doxycycline (VIBRA-TABS) 100 MG tablet  08/25/19  Yes [provider]  Multiple Vitamin (MULTIVITAMIN WITH MINERALS) TABS tablet Take 1 tablet by mouth daily.   Yes [provider]  azithromycin (ZITHROMAX) 250 MG tablet Take 250-500 mg by mouth daily. Started 06/03 for 5 days    [provider]  Diphenhydramine-PE-APAP (THERAFLU EXPRESSMAX) 12.5-5-325 MG/15ML LIQD Take 30 mLs by mouth once.    [provider]  guaiFENesin (ROBITUSSIN) 100 MG/5ML SOLN Take 5 mLs by mouth every 4 (four) hours as needed for cough or to loosen phlegm.    [provider]  lovastatin (MEVACOR) 10 MG tablet Take 1 tablet by mouth at bedtime.  10/19/14  [provider]  pantoprazole (PROTONIX) 20 MG tablet Take 1 tablet (20 mg total) by mouth daily. 12/17/19 01/16/20  Mailani Degroote S, PA-C  predniSONE (DELTASONE) 10 MG tablet Take 4 tablets (40 mg total) by mouth daily. 09/21/15   Harvel Quale, MD  sucralfate (CARAFATE) 1 GM/10ML suspension Take 10 mLs (1 g total) by mouth 4 (four) times daily -  with meals and at bedtime. 12/17/19   Suri Tafolla S, PA-C    Allergies    Codeine  Review of Systems   Review of Systems  Constitutional: Negative for chills and fever.  HENT: Positive for sore throat and trouble swallowing. Negative for ear pain.   Eyes: Negative for pain and visual disturbance.  Respiratory: Positive for shortness of breath (baseline).  Negative for cough.   Cardiovascular: Positive for chest pain. Negative for palpitations.  Gastrointestinal: Positive for nausea. Negative for abdominal pain, diarrhea and vomiting.  Genitourinary: Negative for dysuria and hematuria.  Musculoskeletal: Negative for back pain.  Skin: Negative for rash.  Neurological: Negative for headaches.  All other systems reviewed and are negative.   Physical Exam Updated Vital Signs BP (!) 160/94   Pulse (!) 50   Temp 98.7 F (37.1 C) (Oral)   Resp 19   Ht 5' (1.524 m)   Wt 51.3 kg   SpO2 95%   BMI 22.07 kg/m   Physical Exam Vitals and nursing note reviewed.  Constitutional:      General: She is not in acute distress.    Appearance: She is well-developed.  HENT:     Head: Normocephalic and atraumatic.  Eyes:     Conjunctiva/sclera: Conjunctivae normal.  Cardiovascular:     Rate and Rhythm: Normal rate and regular rhythm.     Heart sounds: Normal heart sounds. No murmur heard.   Pulmonary:     Effort: Pulmonary effort is normal. No respiratory distress.     Breath sounds: Normal breath sounds. No decreased breath sounds, wheezing, rhonchi or rales.  Abdominal:     Palpations: Abdomen is soft.     Tenderness: There is no abdominal tenderness.  Musculoskeletal:     Cervical back: Neck supple.     Right lower leg: No tenderness. No edema.     Left lower leg: No tenderness. No edema.  Skin:    General: Skin is warm and dry.  Neurological:     Mental Status: She is alert.     ED Results / Procedures / Treatments   Labs (all labs ordered are listed, but only abnormal results are displayed) Labs Reviewed  COMPREHENSIVE METABOLIC PANEL - Abnormal; Notable for the following components:      Result Value   Potassium 3.2 (*)    BUN 7 (*)    All other components within normal limits  LIPASE, BLOOD  CBC WITH DIFFERENTIAL/PLATELET  TROPONIN I (HIGH SENSITIVITY)  TROPONIN I (HIGH SENSITIVITY)    EKG EKG  Interpretation  Date/Time:  Thursday December 17 2019 13:52:41 EDT Ventricular Rate:  80 PR Interval:  198 QRS Duration: 84 QT Interval:  358 QTC Calculation: 412 R Axis:   28 Text Interpretation: Normal sinus rhythm Septal infarct , age undetermined T wave abnormality, consider inferior ischemia Abnormal ECG diffuse T wave changes new since 2016 Confirmed by Sherwood Gambler [phone]) on 12/17/2019 1:57:44 PM   EKG Interpretation  Date/Time:  Thursday December 17 2019 15:08:33 EDT Ventricular Rate:  71 PR Interval:  198 QRS Duration: 99 QT Interval:  385  QTC Calculation: 419 R Axis:   11 Text Interpretation: Sinus rhythm Prolonged PR interval RSR' in V1 or V2, right VCD or RVH Borderline T abnormalities, anterior leads T wave changes similar to earlier in the day. nonspecific ST segment in AVL only Confirmed by Sherwood Gambler [phone]) on 12/17/2019 3:51:05 PM        EKG Interpretation  Date/Time:  Thursday December 17 2019 15:08:33 EDT Ventricular Rate:  71 PR Interval:  198 QRS Duration: 99 QT Interval:  385 QTC Calculation: 419 R Axis:   11 Text Interpretation: Sinus rhythm Prolonged PR interval RSR' in V1 or V2, right VCD or RVH Borderline T abnormalities, anterior leads T wave changes similar to earlier in the day. nonspecific ST segment in AVL only Confirmed by Sherwood Gambler [phone]) on 12/17/2019 3:51:05 PM         Radiology DG Chest 2 View  Result Date: 12/17/2019 CLINICAL DATA:  chest pain in a patient with history of acid reflux. Had difficulty sign with swallowing and epigastric pain last night. EXAM: CHEST - 2 VIEW COMPARISON:  Chest x-ray 09/20/2015 FINDINGS: The heart size and mediastinal contours are within normal limits. Both lungs are clear. The visualized skeletal structures are unremarkable. IMPRESSION: No active cardiopulmonary disease. Electronically Signed   By: Iven Finn M.D.   On: 12/17/2019 14:14    Procedures Procedures (including critical  care time)  Medications Ordered in ED Medications  aspirin chewable tablet 324 mg (324 mg Oral Given 12/17/19 1620)  alum & mag hydroxide-simeth (MAALOX/MYLANTA) 200-200-20 MG/5ML suspension 30 mL (30 mLs Oral Given 12/17/19 1520)    And  lidocaine (XYLOCAINE) 2 % viscous mouth solution 15 mL (15 mLs Oral Given 12/17/19 1520)  sucralfate (CARAFATE) 1 GM/10ML suspension 1 g (1 g Oral Given 12/17/19 1519)  famotidine (PEPCID) tablet 20 mg (20 mg Oral Given 12/17/19 1521)  pantoprazole (PROTONIX) injection 40 mg (40 mg Intravenous Given 12/17/19 1830)  potassium chloride SA (KLOR-CON) CR tablet 40 mEq (40 mEq Oral Given 12/17/19 1831)    ED Course  I have reviewed the triage vital signs and the nursing notes.  Pertinent labs & imaging results that were available during my care of the patient were reviewed by me and considered in my medical decision making (see chart for details).    MDM Rules/Calculators/A&P                          69 year old female with a history of acid reflux presenting for evaluation of chest discomfort, throat pain and feeling of difficulty swallowing.  Tried Tums at home without relief.  No known cardiac history  Reviewed/interpreted labs CBC is without leukocytosis or anemia CMP without elevated creatinine, normal LFTs Initial troponin normal at 2, delta troponin pending  Multiple EKGs performed which showed some new twave abnormalities.  No dynamic EKG changes were observed during patient's greater than 4-hour ED stay.  Chest x-ray reviewed/interpreted - No active cardiopulmonary disease.  Patient was given Carafate, GI cocktail, Pepcid and Protonix.  On reassessment she states she feels much improved.  At shift change, care transitioned to Margarita Mail, PA-C to f/u on pending trop and reassessment. If trop neg, likely safe for d/c home with ppi and GI/Cards f/u.   Final Clinical Impression(s) / ED Diagnoses Final diagnoses:  Atypical chest pain    Rx / DC  Orders ED Discharge Orders         Ordered    Ambulatory  referral to Cardiology        12/17/19 1736    pantoprazole (PROTONIX) 20 MG tablet  Daily        12/17/19 1738    sucralfate (CARAFATE) 1 GM/10ML suspension  3 times daily with meals & bedtime        12/17/19 1738           Rodney Booze, PA-C 12/17/19 1831    Rodney Booze, PA-C 12/17/19 1832    Wyvonnia Dusky, MD 12/18/19 [phone]

## 2019-12-17 NOTE — ED Triage Notes (Signed)
Hx of acid reflux. Last night she had difficulty swallowing and epigastric pain. She got relief this am after drinking hot liquid.

## 2019-12-17 NOTE — ED Notes (Signed)
At 1755 PA C asked that a troponin and EKG be done at same time.

## 2019-12-17 NOTE — Discharge Instructions (Addendum)
Your work-up today was reassuring and I suspect that your symptoms may be related to your acid reflux.  I prescribed a medication to help with your symptoms and recommend that you follow-up with your gastroenterologist.  Additionally due to the chest pain that you had today, you were given an ambulatory referral to a cardiology office.  The office will reach out to you to set up an appointment for follow-up.  Please return to the emergency department for any new or worsening symptoms.

## 2019-12-23 DIAGNOSIS — Z1231 Encounter for screening mammogram for malignant neoplasm of breast: Secondary | ICD-10-CM | POA: Diagnosis not present

## 2019-12-25 DIAGNOSIS — M1990 Unspecified osteoarthritis, unspecified site: Secondary | ICD-10-CM | POA: Diagnosis not present

## 2019-12-25 DIAGNOSIS — K219 Gastro-esophageal reflux disease without esophagitis: Secondary | ICD-10-CM | POA: Diagnosis not present

## 2019-12-25 DIAGNOSIS — E782 Mixed hyperlipidemia: Secondary | ICD-10-CM | POA: Diagnosis not present

## 2019-12-25 DIAGNOSIS — Z720 Tobacco use: Secondary | ICD-10-CM | POA: Diagnosis not present

## 2019-12-25 DIAGNOSIS — K581 Irritable bowel syndrome with constipation: Secondary | ICD-10-CM | POA: Diagnosis not present

## 2020-01-13 DIAGNOSIS — D485 Neoplasm of uncertain behavior of skin: Secondary | ICD-10-CM | POA: Diagnosis not present

## 2020-01-18 ENCOUNTER — Ambulatory Visit: Payer: Medicare HMO | Admitting: Cardiovascular Disease

## 2020-01-18 DIAGNOSIS — H6121 Impacted cerumen, right ear: Secondary | ICD-10-CM | POA: Diagnosis not present

## 2020-01-22 ENCOUNTER — Ambulatory Visit: Payer: Medicare HMO | Admitting: Internal Medicine

## 2020-01-24 DIAGNOSIS — Z20822 Contact with and (suspected) exposure to covid-19: Secondary | ICD-10-CM | POA: Diagnosis not present

## 2020-01-25 DIAGNOSIS — Z720 Tobacco use: Secondary | ICD-10-CM | POA: Diagnosis not present

## 2020-01-25 DIAGNOSIS — M1990 Unspecified osteoarthritis, unspecified site: Secondary | ICD-10-CM | POA: Diagnosis not present

## 2020-01-25 DIAGNOSIS — K219 Gastro-esophageal reflux disease without esophagitis: Secondary | ICD-10-CM | POA: Diagnosis not present

## 2020-01-25 DIAGNOSIS — K581 Irritable bowel syndrome with constipation: Secondary | ICD-10-CM | POA: Diagnosis not present

## 2020-01-25 DIAGNOSIS — E782 Mixed hyperlipidemia: Secondary | ICD-10-CM | POA: Diagnosis not present

## 2020-01-25 DIAGNOSIS — R3129 Other microscopic hematuria: Secondary | ICD-10-CM | POA: Diagnosis not present

## 2020-02-12 ENCOUNTER — Ambulatory Visit: Payer: Medicare HMO | Admitting: Internal Medicine

## 2020-04-01 DIAGNOSIS — K581 Irritable bowel syndrome with constipation: Secondary | ICD-10-CM | POA: Diagnosis not present

## 2020-04-01 DIAGNOSIS — E782 Mixed hyperlipidemia: Secondary | ICD-10-CM | POA: Diagnosis not present

## 2020-04-01 DIAGNOSIS — M1990 Unspecified osteoarthritis, unspecified site: Secondary | ICD-10-CM | POA: Diagnosis not present

## 2020-04-01 DIAGNOSIS — Z720 Tobacco use: Secondary | ICD-10-CM | POA: Diagnosis not present

## 2020-04-01 DIAGNOSIS — K219 Gastro-esophageal reflux disease without esophagitis: Secondary | ICD-10-CM | POA: Diagnosis not present

## 2020-04-20 ENCOUNTER — Ambulatory Visit: Payer: Medicare HMO | Admitting: Internal Medicine

## 2020-05-23 ENCOUNTER — Ambulatory Visit (INDEPENDENT_AMBULATORY_CARE_PROVIDER_SITE_OTHER): Payer: Medicare HMO | Admitting: Internal Medicine

## 2020-05-23 ENCOUNTER — Other Ambulatory Visit: Payer: Self-pay

## 2020-05-23 VITALS — BP 98/72 | HR 91 | Ht 61.0 in | Wt 124.0 lb

## 2020-05-23 DIAGNOSIS — R072 Precordial pain: Secondary | ICD-10-CM | POA: Diagnosis not present

## 2020-05-23 DIAGNOSIS — R0600 Dyspnea, unspecified: Secondary | ICD-10-CM | POA: Diagnosis not present

## 2020-05-23 DIAGNOSIS — E785 Hyperlipidemia, unspecified: Secondary | ICD-10-CM | POA: Insufficient documentation

## 2020-05-23 DIAGNOSIS — R079 Chest pain, unspecified: Secondary | ICD-10-CM

## 2020-05-23 DIAGNOSIS — R0609 Other forms of dyspnea: Secondary | ICD-10-CM | POA: Insufficient documentation

## 2020-05-23 LAB — PRO B NATRIURETIC PEPTIDE: NT-Pro BNP: 48 pg/mL (ref 0–301)

## 2020-05-23 NOTE — Patient Instructions (Signed)
Medication Instructions:  Your physician recommends that you continue on your current medications as directed. Please refer to the Current Medication list given to you today.  *If you need a refill on your cardiac medications before your next appointment, please call your pharmacy*   Lab Work: TODAY: BNP  If you have labs (blood work) drawn today and your tests are completely normal, you will receive your results only by: Marland Kitchen MyChart Message (if you have MyChart) OR . A paper copy in the mail If you have any lab test that is abnormal or we need to change your treatment, we will call you to review the results.   Testing/Procedures: Your physician has requested that you have en exercise stress myoview.       COVID TEST--___________-- Dennis Bast will go to [facility] [place]. [place], [place] [address]  for your Covid testing.   This is a drive thru test site.   Be sure to share with the first checkpoint that you are there for pre-procedure/surgery testing. Stay in your car and the nurse team will come to your car to test you.  After you are tested please go home and self-quarantine until the day of your procedure.         Follow-Up: At [facility], you and your health needs are our priority.  As part of our continuing mission to provide you with exceptional heart care, we have created designated Provider Care Teams.  These Care Teams include your primary Cardiologist (physician) and Advanced Practice Providers (APPs -  Physician Assistants and Nurse Practitioners) who all work together to provide you with the care you need, when you need it.  We recommend signing up for the patient portal called "MyChart".  Sign up information is provided on this After Visit Summary.  MyChart is used to connect with patients for Virtual Visits (Telemedicine).  Patients are able to view lab/test results, encounter notes, upcoming appointments, etc.  Non-urgent messages can be sent to your provider as well.   To  learn more about what you can do with MyChart, go to [email or web address].    Your next appointment:   3 month(s)  The format for your next appointment:   In Person  Provider:   You may see Gasper Sells, MD or one of the following Advanced Practice Providers on your designated Care Team:    Melina Copa, PA-C  Ermalinda Barrios, PA-C

## 2020-05-23 NOTE — Addendum Note (Signed)
Addended by: Precious Gilding on: 05/23/2020 12:38 PM   Modules accepted: Orders

## 2020-05-23 NOTE — Progress Notes (Signed)
Cardiology Office Note:    Date:  05/23/2020   ID:  Sarah Moore, DOB 1950/11/05, MRN [id number]  PCP:  Trey Sailors, PA  [facility] Cardiologist:  No primary care provider on file.  [facility] Electrophysiologist:  None   Referring MD: Trey Sailors, PA   CC: Chest pain Consulted for the evaluation of chest pain at the behest of Trey Sailors, [place]  History of Present Illness:    Sarah Moore is a 70 y.o. female with a hx of GERD, esophageal stenosis and dilation in the past, HLD, who is smoke free for the past 6 months who presents for evaluation 05/23/20.  Patient notes that she is feeling OK.  She has been having an unusual chest soreness on the right side.    Patient had chest pain 12/24/2019 : Had chest pain with sudden on sent and improved with hot cocoa.  With some DOE:  Work up in ED was largely benign.  Has had no chest pressure, chest tightness, chest stinging .  Discomfort occurs activity on occasion, with food on occasion, worsens with no provocation that she can tel, and improves with spontaneously.  Patient exertion notable for scrubbing her floors and walking around with and feels no symptoms.  No shortness of breath at rest.  Notes PND and 2 pillow orthopnea.  No bendopnea, weight gain, leg swelling , or abdominal swelling.  No syncope or near syncope . Notes occasional palpitations and funny heart beats associated with the event.     No history of pre-eclampsia, early menarche, or prematurity.    Past Medical History:  Diagnosis Date  . Acid reflux   . Arthritis   . Chest pain   . Fatigue   . GERD (gastroesophageal reflux disease)   . Hyperlipidemia     Past Surgical History:  Procedure Laterality Date  . ABDOMINAL HYSTERECTOMY    . COLONOSCOPY    . KNEE ARTHROPLASTY Left     Current Medications: Current Meds  Medication Sig  . aspirin EC 81 MG tablet Take 81 mg by mouth daily.  . Cephalexin 500 MG tablet Take 500 mg by mouth  daily.  . cycloSPORINE (RESTASIS) 0.05 % ophthalmic emulsion Place 1 drop into both eyes 2 (two) times daily as needed (for dry eyes).  . Glycerin-Polysorbate 80 (REFRESH DRY EYE THERAPY OP) Apply 1 drop to eye daily as needed.     Allergies:   Codeine   Social History   Socioeconomic History  . Marital status: Single    Spouse name: Not on file  . Number of children: 3  . Years of education: Not on file  . Highest education level: Not on file  Occupational History  . Occupation: retired  Tobacco Use  . Smoking status: Former Smoker    Packs/day: 0.25    Types: Cigarettes  . Smokeless tobacco: Never Used  Substance and Sexual Activity  . Alcohol use: No    Alcohol/week: 0.0 standard drinks  . Drug use: No  . Sexual activity: Not on file  Other Topics Concern  . Not on file  Social History Narrative   She says she is single she has 3 sons and one daughter. She works part-time at [employer] but is otherwise retired.11/02/2014   Social Determinants of Health   Financial Resource Strain: Not on file  Food Insecurity: Not on file  Transportation Needs: Not on file  Physical Activity: Not on file  Stress: Not on  file  Social Connections: Not on file     Family History: The patient's family history includes Asthma in her brother; Clotting disorder in her son; Stomach cancer in her brother. She was adopted.  ROS:   Please see the history of present illness.    All other systems reviewed and are negative.  EKGs/Labs/Other Studies Reviewed:    The following studies were reviewed today:  EKG:   05/23/2020: SR Rate 88 WNL   Recent Labs: 12/17/2019: ALT 19; BUN 7; Creatinine, Ser 0.47; Hemoglobin 11.4; Platelets 246; Potassium 3.2; Sodium 136  Recent Lipid Panel    Component Value Date/Time   CHOL 212 (H) 02/10/2009 1947   TRIG 74 02/10/2009 1947   HDL 63 02/10/2009 1947   CHOLHDL 3.4 Ratio 02/10/2009 1947   VLDL 15 02/10/2009 1947   [place] 134 (H) 02/10/2009  [facility]  or Outside Clinic Studies (OSH):  Date: 09/01/2018 Cholesterol 253 HDL 85 LDL 162 Tgs 103 Creatinine 0.52 TSH 1.89 BNP NA   Risk Assessment/Calculations:     ASVCD Risk of 8%  Physical Exam:    VS:  BP 98/72   Pulse 91   Ht 5\' 1"  (1.549 m)   Wt 124 lb (56.2 kg)   BMI 23.43 kg/m     Wt Readings from Last 3 Encounters:  05/23/20 124 lb (56.2 kg)  12/17/19 113 lb (51.3 kg)  05/24/17 109 lb (49.4 kg)     GEN:  Well nourished, well developed in no acute distress HEENT: Normal NECK: No JVD; No carotid bruits LYMPHATICS: No lymphadenopathy CARDIAC: RRR, no murmurs, rubs, gallops RESPIRATORY:  Clear to auscultation without rales, wheezing or rhonchi  ABDOMEN: Soft, non-tender, non-distended MUSCULOSKELETAL:  No edema; No deformity  SKIN: Warm and dry NEUROLOGIC:  Alert and oriented x 3 PSYCHIATRIC:  Normal affect   ASSESSMENT:    1. Chest pain of uncertain etiology   2. DOE (dyspnea on exertion)   3. Hyperlipidemia, unspecified hyperlipidemia type   4. Precordial pain    PLAN:    In order of problems listed above:  Chest Pain Syndrome DOE possible angina equivalent  - The patient presents with possibly cardiac symptoms - EKG shows  without evidence of accessory pathway, ventricular pacing, digoxin use, LBBB, or baseline ST changes. - ASCVD risk estimated at 8% - Additional Blood Work:  Lipids Fasting at next visit  - Continue ASA 81 mg QD - will get BNP - Would recommend exercise nuclear medicine stress test (NPO at midnight); discussed risks, benefits, and alternatives of the diagnostic procedure including chest pain, arrhythmia, and death.  Patient amenable for testing.  Three months follow up unless new symptoms or abnormal test results warranting change in plan  Would be reasonable for  APP Follow up   Shared Decision Making/Informed Consent The risks [chest pain, shortness of breath, cardiac arrhythmias, dizziness, blood  pressure fluctuations, myocardial infarction, stroke/transient ischemic attack, nausea, vomiting, allergic reaction, radiation exposure, metallic taste sensation and life-threatening complications (estimated to be 1 in 10,000)], benefits (risk stratification, diagnosing coronary artery disease, treatment guidance) and alternatives of a nuclear stress test were discussed in detail with Sarah Moore and she agrees to proceed.       Medication Adjustments/Labs and Tests Ordered: Current medicines are reviewed at length with the patient today.  Concerns regarding medicines are outlined above.  Orders Placed This Encounter  Procedures  . B Nat Peptide  . Cardiac Stress Test: Informed Consent Details: Physician/Practitioner Attestation; Transcribe  to consent form and obtain patient signature  . MYOCARDIAL PERFUSION IMAGING  . EKG 12-Lead   No orders of the defined types were placed in this encounter.   Patient Instructions  Medication Instructions:  Your physician recommends that you continue on your current medications as directed. Please refer to the Current Medication list given to you today.  *If you need a refill on your cardiac medications before your next appointment, please call your pharmacy*   Lab Work: TODAY: BNP  If you have labs (blood work) drawn today and your tests are completely normal, you will receive your results only by: Marland Kitchen MyChart Message (if you have MyChart) OR . A paper copy in the mail If you have any lab test that is abnormal or we need to change your treatment, we will call you to review the results.   Testing/Procedures: Your physician has requested that you have en exercise stress myoview.       COVID TEST--___________-- Dennis Bast will go to [place]. [place], [place] [address]  for your Covid testing.   This is a drive thru test site.   Be sure to share with the first checkpoint that you are there for pre-procedure/surgery testing. Stay in your car and the  nurse team will come to your car to test you.  After you are tested please go home and self-quarantine until the day of your procedure.         Follow-Up: At [facility], you and your health needs are our priority.  As part of our continuing mission to provide you with exceptional heart care, we have created designated Provider Care Teams.  These Care Teams include your primary Cardiologist (physician) and Advanced Practice Providers (APPs -  Physician Assistants and Nurse Practitioners) who all work together to provide you with the care you need, when you need it.  We recommend signing up for the patient portal called "MyChart".  Sign up information is provided on this After Visit Summary.  MyChart is used to connect with patients for Virtual Visits (Telemedicine).  Patients are able to view lab/test results, encounter notes, upcoming appointments, etc.  Non-urgent messages can be sent to your provider as well.   To learn more about what you can do with MyChart, go to [email or web address].    Your next appointment:   3 month(s)  The format for your next appointment:   In Person  Provider:   You may see Gasper Sells, MD or one of the following Advanced Practice Providers on your designated Care Team:    Melina Copa, PA-C  Ermalinda Barrios, PA-C        Signed, Werner Lean, MD  05/23/2020 12:03 PM    [place]

## 2020-05-28 ENCOUNTER — Other Ambulatory Visit (HOSPITAL_COMMUNITY): Payer: Medicare HMO

## 2020-06-01 ENCOUNTER — Telehealth (HOSPITAL_COMMUNITY): Payer: Self-pay | Admitting: *Deleted

## 2020-06-01 NOTE — Telephone Encounter (Signed)
Patient given detailed instructions per Myocardial Perfusion Study Information Sheet for the test on 06/01/20. Patient notified to arrive 15 minutes early and that it is imperative to arrive on time for appointment to keep from having the test rescheduled.  If you need to cancel or reschedule your appointment, please call the office within 24 hours of your appointment. . Patient verbalized understanding. Kirstie Peri

## 2020-06-04 ENCOUNTER — Other Ambulatory Visit (HOSPITAL_COMMUNITY)
Admission: RE | Admit: 2020-06-04 | Discharge: 2020-06-04 | Disposition: A | Discharge status: Home / Self Care | Payer: Medicare HMO | Source: Ambulatory Visit | Attending: Internal Medicine | Admitting: Internal Medicine

## 2020-06-04 DIAGNOSIS — Z01812 Encounter for preprocedural laboratory examination: Secondary | ICD-10-CM | POA: Diagnosis not present

## 2020-06-04 DIAGNOSIS — Z20822 Contact with and (suspected) exposure to covid-19: Secondary | ICD-10-CM | POA: Insufficient documentation

## 2020-06-04 LAB — SARS CORONAVIRUS 2 (TAT 6-24 HRS): SARS Coronavirus 2: NEGATIVE

## 2020-06-06 ENCOUNTER — Other Ambulatory Visit: Payer: Self-pay

## 2020-06-06 ENCOUNTER — Ambulatory Visit (HOSPITAL_COMMUNITY): Payer: Medicare HMO | Attending: Internal Medicine

## 2020-06-06 DIAGNOSIS — R072 Precordial pain: Secondary | ICD-10-CM | POA: Insufficient documentation

## 2020-06-06 MED ORDER — TECHNETIUM TC 99M TETROFOSMIN IV KIT
10.5000 | PACK | Freq: Once | INTRAVENOUS | Status: AC | PRN
  Administered 2020-06-06: 10.5 via INTRAVENOUS
  Filled 2020-06-06: qty 11

## 2020-06-06 MED ORDER — TECHNETIUM TC 99M TETROFOSMIN IV KIT
32.6000 | PACK | Freq: Once | INTRAVENOUS | Status: AC | PRN
  Administered 2020-06-06: 32.6 via INTRAVENOUS
  Filled 2020-06-06: qty 33

## 2020-06-07 LAB — MYOCARDIAL PERFUSION IMAGING
Estimated workload: 5.8 METS
Exercise duration (min): 4 min
LV dias vol: 42 mL (ref 46–106)
LV sys vol: 10 mL
MPHR: 151 {beats}/min
Peak HR: 153 {beats}/min
Percent HR: 101 %
RPE: 19
Rest HR: 68 {beats}/min
SDS: 1
SRS: 0
SSS: 1
TID: 0.83

## 2020-06-08 ENCOUNTER — Telehealth: Payer: Self-pay

## 2020-06-08 NOTE — Telephone Encounter (Signed)
-----   Message from Werner Lean, MD sent at 06/07/2020  5:40 PM EST ----- Results: Negative stress test Plan: No change in meds  Werner Lean, MD

## 2020-06-08 NOTE — Telephone Encounter (Signed)
The patient has been notified of the result and verbalized understanding.  All questions (if any) were answered. Wilma Flavin, RN 06/08/2020 8:32 AM

## 2020-07-04 DIAGNOSIS — K219 Gastro-esophageal reflux disease without esophagitis: Secondary | ICD-10-CM | POA: Diagnosis not present

## 2020-07-04 DIAGNOSIS — E782 Mixed hyperlipidemia: Secondary | ICD-10-CM | POA: Diagnosis not present

## 2020-07-04 DIAGNOSIS — Z720 Tobacco use: Secondary | ICD-10-CM | POA: Diagnosis not present

## 2020-07-04 DIAGNOSIS — K581 Irritable bowel syndrome with constipation: Secondary | ICD-10-CM | POA: Diagnosis not present

## 2020-07-04 DIAGNOSIS — M1990 Unspecified osteoarthritis, unspecified site: Secondary | ICD-10-CM | POA: Diagnosis not present

## 2020-07-04 DIAGNOSIS — J302 Other seasonal allergic rhinitis: Secondary | ICD-10-CM | POA: Diagnosis not present

## 2020-09-13 ENCOUNTER — Ambulatory Visit: Payer: Medicare HMO | Admitting: Internal Medicine

## 2020-10-04 DIAGNOSIS — E782 Mixed hyperlipidemia: Secondary | ICD-10-CM | POA: Diagnosis not present

## 2020-10-04 DIAGNOSIS — K581 Irritable bowel syndrome with constipation: Secondary | ICD-10-CM | POA: Diagnosis not present

## 2020-10-04 DIAGNOSIS — Z0001 Encounter for general adult medical examination with abnormal findings: Secondary | ICD-10-CM | POA: Diagnosis not present

## 2020-10-04 DIAGNOSIS — Z720 Tobacco use: Secondary | ICD-10-CM | POA: Diagnosis not present

## 2020-10-04 DIAGNOSIS — M1990 Unspecified osteoarthritis, unspecified site: Secondary | ICD-10-CM | POA: Diagnosis not present

## 2020-10-04 DIAGNOSIS — R739 Hyperglycemia, unspecified: Secondary | ICD-10-CM | POA: Diagnosis not present

## 2020-10-04 DIAGNOSIS — K219 Gastro-esophageal reflux disease without esophagitis: Secondary | ICD-10-CM | POA: Diagnosis not present

## 2020-10-04 DIAGNOSIS — J302 Other seasonal allergic rhinitis: Secondary | ICD-10-CM | POA: Diagnosis not present

## 2020-10-09 NOTE — Progress Notes (Addendum)
Cardiology Office Note:    Date:  10/10/2020   ID:  [name], DOB [birth year], MRN [id number]  PCP:  Trey Sailors, PA  [facility] Cardiologist:  Werner Lean, MD  [place] Electrophysiologist:  None   Referring MD: Trey Sailors, PA   CC: Chest pain follow up  History of Present Illness:    Sarah Moore is a 70 y.o. female with a hx of GERD, esophageal stenosis and dilation in the past, HLD, who is smoke free for the past 6 months who presents for evaluation 05/23/20. Had CP at that assessment and given risk factors and stress test (negative but with HTN response) and negative BNP Seen 10/10/20.  Patient notes that she is doing great.  Since last visit notes feeling much better. Relevant interval testing or therapy include negative stress test.  There are no interval hospital/ED visit.    No chest pain or pressure.  No SOB/DOE and no PND/Orthopnea.  No weight gain or leg swelling.    Notes new palpitations.  These occur at night or went watching TV.  No syncope or near syncope.  In the past two weeks have felt it once last week.  Ambulatory blood pressure ~ 130/70.   Past Medical History:  Diagnosis Date   Acid reflux    Arthritis    Chest pain    Fatigue    GERD (gastroesophageal reflux disease)    Hyperlipidemia     Past Surgical History:  Procedure Laterality Date   ABDOMINAL HYSTERECTOMY     COLONOSCOPY     KNEE ARTHROPLASTY Left     Current Medications: Current Meds  Medication Sig   aspirin EC 81 MG tablet Take 81 mg by mouth daily.   cycloSPORINE (RESTASIS) 0.05 % ophthalmic emulsion Place 1 drop into both eyes 2 (two) times daily as needed (for dry eyes).   doxycycline (VIBRA-TABS) 100 MG tablet    Glycerin-Polysorbate 80 (REFRESH DRY EYE THERAPY OP) Apply 1 drop to eye daily as needed.   levocetirizine (XYZAL) 5 MG tablet as needed.   Multiple Vitamin (MULTIVITAMIN WITH MINERALS) TABS tablet Take 1 tablet by mouth daily.      Allergies:   Codeine   Social History   Socioeconomic History   Marital status: Single    Spouse name: Not on file   Number of children: 3   Years of education: Not on file   Highest education level: Not on file  Occupational History   Occupation: retired  Tobacco Use   Smoking status: Former    Packs/day: 0.25    Pack years: 0.00    Types: Cigarettes   Smokeless tobacco: Never  Substance and Sexual Activity   Alcohol use: No    Alcohol/week: 0.0 standard drinks   Drug use: No   Sexual activity: Not on file  Other Topics Concern   Not on file  Social History Narrative   She says she is single she has 3 sons and one daughter. She works part-time at [employer] but is otherwise retired.11/02/2014   Social Determinants of Health   Financial Resource Strain: Not on file  Food Insecurity: Not on file  Transportation Needs: Not on file  Physical Activity: Not on file  Stress: Not on file  Social Connections: Not on file     Family History: The patient's family history includes Asthma in her brother; Clotting disorder in her son; Stomach cancer in her brother. She was adopted.  ROS:   Please see the history of present illness.    All other systems reviewed and are negative.  EKGs/Labs/Other Studies Reviewed:    The following studies were reviewed today:  EKG:   05/23/2020: SR Rate 88 WNL   NM Stress Testing: Date: 06/06/20 Results: The left ventricular ejection fraction is hyperdynamic (>65%). Nuclear stress EF: 75%. Blood pressure demonstrated a hypertensive response to exercise. There was no ST segment deviation noted during stress. The study is normal. This is a low risk study.   Negative study for ischemia or infarction.   Recent Labs: 12/17/2019: ALT 19; BUN 7; Creatinine, Ser 0.47; Hemoglobin 11.4; Platelets 246; Potassium 3.2; Sodium 136 05/23/2020: NT-Pro BNP 48  Recent Lipid Panel    Component Value Date/Time   CHOL 212 (H) 02/10/2009 1947    TRIG 74 02/10/2009 1947   HDL 63 02/10/2009 1947   CHOLHDL 3.4 Ratio 02/10/2009 1947   VLDL 15 02/10/2009 1947   [place] 134 (H) 02/10/2009 [facility]  or Outside Clinic Studies (OSH):  Date: 09/01/2018 Cholesterol 253 HDL 85 LDL 162 Tgs 103 Creatinine 0.52 TSH 1.89 BNP NA   Risk Assessment/Calculations:     ASVCD Risk of 8%  Physical Exam:    VS:  BP 130/70   Pulse 96   Ht 5' (1.524 m)   Wt 56.2 kg   SpO2 97%   BMI 24.22 kg/m     Wt Readings from Last 3 Encounters:  10/10/20 56.2 kg  06/06/20 56.2 kg  05/23/20 56.2 kg     GEN:  Well nourished, well developed in no acute distress HEENT: Normal NECK: No JVD; No carotid bruits  LYMPHATICS: No lymphadenopathy CARDIAC: RRR, no murmurs, rubs, gallops RESPIRATORY:  Clear to auscultation without rales, wheezing or rhonchi  ABDOMEN: Soft, non-tender, non-distended MUSCULOSKELETAL:  No edema; No deformity  SKIN: Warm and dry NEUROLOGIC:  Alert and oriented x 3 PSYCHIATRIC:  Normal affect   ASSESSMENT:    1. Palpitations   2. Hyperlipidemia, unspecified hyperlipidemia type   3. Former tobacco use     PLAN:    In order of problems listed above:  Palpitations HLD Former Tobacco Abuse CP and DOE-resolved - low threshold for ZioPatch if worsening palpitations - LDL Above goal, will get labs from Ms. Vanstory at [place]; if elevated will discuss re-challenge of statin (lovastatin 10 mg) - discussed dietary changes focusing on bacon and other  - discussed coming off aspirin for primary prevention  One year follow up unless new symptoms or abnormal test results warranting change in plan  Addendum: Labs from 10/04/20 - AST 18, ALT 12 LDL 161, Tgs 222, Total 285, HDL 86 - continue dietary changes; we will be offering rosuvastatin 5 mg PO Daily and recheck in 3 months  Medication Adjustments/Labs and Tests Ordered: Current medicines are reviewed at length with the  patient today.  Concerns regarding medicines are outlined above.  No orders of the defined types were placed in this encounter.  No orders of the defined types were placed in this encounter.   There are no Patient Instructions on file for this visit.   Signed, Werner Lean, MD  10/10/2020 9:29 AM    [facility]

## 2020-10-10 ENCOUNTER — Ambulatory Visit (INDEPENDENT_AMBULATORY_CARE_PROVIDER_SITE_OTHER): Payer: Medicare HMO | Admitting: Internal Medicine

## 2020-10-10 ENCOUNTER — Encounter: Payer: Self-pay | Admitting: Internal Medicine

## 2020-10-10 ENCOUNTER — Other Ambulatory Visit: Payer: Self-pay

## 2020-10-10 VITALS — BP 130/70 | HR 96 | Ht 60.0 in | Wt 124.0 lb

## 2020-10-10 DIAGNOSIS — E785 Hyperlipidemia, unspecified: Secondary | ICD-10-CM | POA: Diagnosis not present

## 2020-10-10 DIAGNOSIS — R002 Palpitations: Secondary | ICD-10-CM | POA: Diagnosis not present

## 2020-10-10 DIAGNOSIS — Z87891 Personal history of nicotine dependence: Secondary | ICD-10-CM | POA: Diagnosis not present

## 2020-10-10 NOTE — Patient Instructions (Signed)
Medication Instructions:  Your physician has recommended you make the following change in your medication:  STOP: Aspirin  *If you need a refill on your cardiac medications before your next appointment, please call your pharmacy*   Lab Work: NONE If you have labs (blood work) drawn today and your tests are completely normal, you will receive your results only by: [place] (if you have MyChart) OR A paper copy in the mail If you have any lab test that is abnormal or we need to change your treatment, we will call you to review the results.   Testing/Procedures: NONE   Follow-Up: At [facility], you and your health needs are our priority.  As part of our continuing mission to provide you with exceptional heart care, we have created designated Provider Care Teams.  These Care Teams include your primary Cardiologist (physician) and Advanced Practice Providers (APPs -  Physician Assistants and Nurse Practitioners) who all work together to provide you with the care you need, when you need it.  We recommend signing up for the patient portal called "MyChart".  Sign up information is provided on this After Visit Summary.  MyChart is used to connect with patients for Virtual Visits (Telemedicine).  Patients are able to view lab/test results, encounter notes, upcoming appointments, etc.  Non-urgent messages can be sent to your provider as well.   To learn more about what you can do with MyChart, go to [email or web address].    Your next appointment:   12 month(s)  The format for your next appointment:   In Person  Provider:   You may see Werner Lean, MD or one of the following Advanced Practice Providers on your designated Care Team:   Melina Copa, PA-C Ermalinda Barrios, PA-C   Other Instructions Dr.Chandrasekhar will review your lab results from [place] if cholesterol medication is needed we will contact you.

## 2020-10-13 ENCOUNTER — Telehealth: Payer: Self-pay | Admitting: Internal Medicine

## 2020-10-13 NOTE — Telephone Encounter (Signed)
[facility] received back the requested medical records sent on 10/11/20. The records have been placed in Dr. Gasper Sells box to be reviewed. AO 10/13/20

## 2020-11-01 DIAGNOSIS — J302 Other seasonal allergic rhinitis: Secondary | ICD-10-CM | POA: Diagnosis not present

## 2020-11-01 DIAGNOSIS — M1990 Unspecified osteoarthritis, unspecified site: Secondary | ICD-10-CM | POA: Diagnosis not present

## 2020-11-01 DIAGNOSIS — E782 Mixed hyperlipidemia: Secondary | ICD-10-CM | POA: Diagnosis not present

## 2020-11-01 DIAGNOSIS — K581 Irritable bowel syndrome with constipation: Secondary | ICD-10-CM | POA: Diagnosis not present

## 2020-11-01 DIAGNOSIS — K219 Gastro-esophageal reflux disease without esophagitis: Secondary | ICD-10-CM | POA: Diagnosis not present

## 2020-11-01 DIAGNOSIS — Z720 Tobacco use: Secondary | ICD-10-CM | POA: Diagnosis not present

## 2020-11-01 DIAGNOSIS — Z0001 Encounter for general adult medical examination with abnormal findings: Secondary | ICD-10-CM | POA: Diagnosis not present

## 2020-11-23 DIAGNOSIS — E782 Mixed hyperlipidemia: Secondary | ICD-10-CM | POA: Diagnosis not present

## 2020-11-23 DIAGNOSIS — K219 Gastro-esophageal reflux disease without esophagitis: Secondary | ICD-10-CM | POA: Diagnosis not present

## 2020-11-23 DIAGNOSIS — Z720 Tobacco use: Secondary | ICD-10-CM | POA: Diagnosis not present

## 2020-11-23 DIAGNOSIS — M1990 Unspecified osteoarthritis, unspecified site: Secondary | ICD-10-CM | POA: Diagnosis not present

## 2020-11-23 DIAGNOSIS — J302 Other seasonal allergic rhinitis: Secondary | ICD-10-CM | POA: Diagnosis not present

## 2020-11-23 DIAGNOSIS — K130 Diseases of lips: Secondary | ICD-10-CM | POA: Diagnosis not present

## 2020-11-23 DIAGNOSIS — K581 Irritable bowel syndrome with constipation: Secondary | ICD-10-CM | POA: Diagnosis not present

## 2020-12-24 DIAGNOSIS — Z1231 Encounter for screening mammogram for malignant neoplasm of breast: Secondary | ICD-10-CM | POA: Diagnosis not present

## 2021-01-13 DIAGNOSIS — M85851 Other specified disorders of bone density and structure, right thigh: Secondary | ICD-10-CM | POA: Diagnosis not present

## 2021-01-13 DIAGNOSIS — M85852 Other specified disorders of bone density and structure, left thigh: Secondary | ICD-10-CM | POA: Diagnosis not present

## 2021-01-13 DIAGNOSIS — Z780 Asymptomatic menopausal state: Secondary | ICD-10-CM | POA: Diagnosis not present

## 2021-02-01 DIAGNOSIS — K219 Gastro-esophageal reflux disease without esophagitis: Secondary | ICD-10-CM | POA: Diagnosis not present

## 2021-02-01 DIAGNOSIS — K581 Irritable bowel syndrome with constipation: Secondary | ICD-10-CM | POA: Diagnosis not present

## 2021-02-01 DIAGNOSIS — M1990 Unspecified osteoarthritis, unspecified site: Secondary | ICD-10-CM | POA: Diagnosis not present

## 2021-02-01 DIAGNOSIS — M8589 Other specified disorders of bone density and structure, multiple sites: Secondary | ICD-10-CM | POA: Diagnosis not present

## 2021-02-01 DIAGNOSIS — J302 Other seasonal allergic rhinitis: Secondary | ICD-10-CM | POA: Diagnosis not present

## 2021-02-01 DIAGNOSIS — Z720 Tobacco use: Secondary | ICD-10-CM | POA: Diagnosis not present

## 2021-02-01 DIAGNOSIS — E782 Mixed hyperlipidemia: Secondary | ICD-10-CM | POA: Diagnosis not present

## 2021-02-01 DIAGNOSIS — Z0001 Encounter for general adult medical examination with abnormal findings: Secondary | ICD-10-CM | POA: Diagnosis not present

## 2021-04-05 DIAGNOSIS — K581 Irritable bowel syndrome with constipation: Secondary | ICD-10-CM | POA: Diagnosis not present

## 2021-04-05 DIAGNOSIS — M1990 Unspecified osteoarthritis, unspecified site: Secondary | ICD-10-CM | POA: Diagnosis not present

## 2021-04-05 DIAGNOSIS — M8589 Other specified disorders of bone density and structure, multiple sites: Secondary | ICD-10-CM | POA: Diagnosis not present

## 2021-04-05 DIAGNOSIS — J302 Other seasonal allergic rhinitis: Secondary | ICD-10-CM | POA: Diagnosis not present

## 2021-04-05 DIAGNOSIS — Z720 Tobacco use: Secondary | ICD-10-CM | POA: Diagnosis not present

## 2021-04-05 DIAGNOSIS — K219 Gastro-esophageal reflux disease without esophagitis: Secondary | ICD-10-CM | POA: Diagnosis not present

## 2021-04-05 DIAGNOSIS — E782 Mixed hyperlipidemia: Secondary | ICD-10-CM | POA: Diagnosis not present

## 2021-05-08 DIAGNOSIS — E782 Mixed hyperlipidemia: Secondary | ICD-10-CM | POA: Diagnosis not present

## 2021-05-08 DIAGNOSIS — J209 Acute bronchitis, unspecified: Secondary | ICD-10-CM | POA: Diagnosis not present

## 2021-05-13 ENCOUNTER — Emergency Department (HOSPITAL_COMMUNITY): Payer: Medicare HMO

## 2021-05-13 ENCOUNTER — Encounter (HOSPITAL_COMMUNITY): Payer: Self-pay

## 2021-05-13 ENCOUNTER — Emergency Department (HOSPITAL_COMMUNITY)
Admission: EM | Admit: 2021-05-13 | Discharge: 2021-05-13 | Disposition: A | Discharge status: Home / Self Care | Payer: Medicare HMO | Attending: Emergency Medicine | Admitting: Emergency Medicine

## 2021-05-13 ENCOUNTER — Other Ambulatory Visit: Payer: Self-pay

## 2021-05-13 DIAGNOSIS — Z20822 Contact with and (suspected) exposure to covid-19: Secondary | ICD-10-CM | POA: Diagnosis not present

## 2021-05-13 DIAGNOSIS — R0902 Hypoxemia: Secondary | ICD-10-CM | POA: Diagnosis not present

## 2021-05-13 DIAGNOSIS — J101 Influenza due to other identified influenza virus with other respiratory manifestations: Secondary | ICD-10-CM | POA: Diagnosis not present

## 2021-05-13 DIAGNOSIS — R231 Pallor: Secondary | ICD-10-CM | POA: Diagnosis not present

## 2021-05-13 DIAGNOSIS — R059 Cough, unspecified: Secondary | ICD-10-CM | POA: Diagnosis not present

## 2021-05-13 DIAGNOSIS — R55 Syncope and collapse: Secondary | ICD-10-CM | POA: Diagnosis not present

## 2021-05-13 DIAGNOSIS — I10 Essential (primary) hypertension: Secondary | ICD-10-CM | POA: Diagnosis not present

## 2021-05-13 DIAGNOSIS — J111 Influenza due to unidentified influenza virus with other respiratory manifestations: Secondary | ICD-10-CM

## 2021-05-13 DIAGNOSIS — E860 Dehydration: Secondary | ICD-10-CM | POA: Diagnosis not present

## 2021-05-13 DIAGNOSIS — E876 Hypokalemia: Secondary | ICD-10-CM | POA: Diagnosis not present

## 2021-05-13 LAB — COMPREHENSIVE METABOLIC PANEL
ALT: 22 U/L (ref 0–44)
AST: 25 U/L (ref 15–41)
Albumin: 3.3 g/dL — ABNORMAL LOW (ref 3.5–5.0)
Alkaline Phosphatase: 50 U/L (ref 38–126)
Anion gap: 8 (ref 5–15)
BUN: 16 mg/dL (ref 8–23)
CO2: 27 mmol/L (ref 22–32)
Calcium: 8.9 mg/dL (ref 8.9–10.3)
Chloride: 105 mmol/L (ref 98–111)
Creatinine, Ser: 0.6 mg/dL (ref 0.44–1.00)
GFR, Estimated: >60 mL/min (ref >60)
Glucose, Bld: 98 mg/dL (ref 70–99)
Potassium: 3.3 mmol/L — ABNORMAL LOW (ref 3.5–5.1)
Sodium: 140 mmol/L (ref 135–145)
Total Bilirubin: 1.1 mg/dL (ref 0.3–1.2)
Total Protein: 6.6 g/dL (ref 6.5–8.1)

## 2021-05-13 LAB — CBC WITH DIFFERENTIAL/PLATELET
Abs Immature Granulocytes: 0.02 10*3/uL (ref 0.00–0.07)
Basophils Absolute: 0 10*3/uL (ref 0.0–0.1)
Basophils Relative: 0 %
Eosinophils Absolute: 0 10*3/uL (ref 0.0–0.5)
Eosinophils Relative: 0 %
HCT: 34.7 % — ABNORMAL LOW (ref 36.0–46.0)
Hemoglobin: 10.9 g/dL — ABNORMAL LOW (ref 12.0–15.0)
Immature Granulocytes: 0 %
Lymphocytes Relative: 66 %
Lymphs Abs: 3.9 10*3/uL (ref 0.7–4.0)
MCH: 28.7 pg (ref 26.0–34.0)
MCHC: 31.4 g/dL (ref 30.0–36.0)
MCV: 91.3 fL (ref 80.0–100.0)
Monocytes Absolute: 0.4 10*3/uL (ref 0.1–1.0)
Monocytes Relative: 7 %
Neutro Abs: 1.6 10*3/uL — ABNORMAL LOW (ref 1.7–7.7)
Neutrophils Relative %: 27 %
Platelets: 184 10*3/uL (ref 150–400)
RBC: 3.8 MIL/uL — ABNORMAL LOW (ref 3.87–5.11)
RDW: 13.9 % (ref 11.5–15.5)
WBC: 5.9 10*3/uL (ref 4.0–10.5)
nRBC: 0 % (ref 0.0–0.2)

## 2021-05-13 LAB — RESP PANEL BY RT-PCR (FLU A&B, COVID) ARPGX2
Influenza A by PCR: POSITIVE — AB
Influenza B by PCR: NEGATIVE
SARS Coronavirus 2 by RT PCR: NEGATIVE

## 2021-05-13 MED ORDER — SODIUM CHLORIDE 0.9 % IV BOLUS
500.0000 mL | Freq: Once | INTRAVENOUS | Status: AC
  Administered 2021-05-13: 500 mL via INTRAVENOUS

## 2021-05-13 MED ORDER — POTASSIUM CHLORIDE CRYS ER 20 MEQ PO TBCR
40.0000 meq | EXTENDED_RELEASE_TABLET | Freq: Once | ORAL | Status: AC
  Administered 2021-05-13: 40 meq via ORAL
  Filled 2021-05-13: qty 2

## 2021-05-13 NOTE — ED Notes (Signed)
Pt states understanding of dc instructions, importance of follow up, and work note. Pt denies questions or concerns upon dc. Pt assisted into wheelchair and wheeled out by family.  No belongings left in room upon dc.

## 2021-05-13 NOTE — ED Provider Notes (Signed)
Sarah Moore Provider Note   CSN: [id number] Arrival date & time: 05/13/21  1318     History  Chief Complaint  Patient presents with   Near Syncope    Sarah Moore is a 71 y.o. female.  71 year old female with prior medical history as detailed below presents for evaluation.  Patient reports that she was shopping today at [place].  She got to checkout and felt suddenly lightheaded.  She denies full syncope.  Patient reports that she almost passed out.  Symptoms lasted 10-15 minutes. She denies associated chest pain or shortness of breath.  Patient reports that she feels significantly improved now.  Patient reports that roughly 1 1/2 weeks ago she developed mild cough.  She was seen by her PCP this past Monday (5 days prior).  She was diagnosed with possible bronchospasm.  She reports a negative COVID test at that time.  She reports a chest x-ray obtained on Monday of this past week was negative for pneumonia.  Her PCP gave her an inhaler and a course of steroids.  She reports that her cough is not improved.  She complains of continued fatigue and weakness for the last several days.  She denies fever.  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  The history is provided by the patient.  Near Syncope This is a new problem. The current episode started less than 1 hour ago. The problem occurs rarely. The problem has not changed since onset.Pertinent negatives include no chest pain and no abdominal pain. Nothing aggravates the symptoms. Nothing relieves the symptoms.      Home Medications Prior to Admission medications   Medication Sig Start Date End Date Taking? Authorizing Provider  cycloSPORINE (RESTASIS) 0.05 % ophthalmic emulsion Place 1 drop into both eyes 2 (two) times daily as needed (for dry eyes).    [provider]  doxycycline (VIBRA-TABS) 100 MG tablet  08/25/19   [provider]  Glycerin-Polysorbate 80  (REFRESH DRY EYE THERAPY OP) Apply 1 drop to eye daily as needed.    [provider]  levocetirizine (XYZAL) 5 MG tablet as needed. 07/04/20   [provider]  Multiple Vitamin (MULTIVITAMIN WITH MINERALS) TABS tablet Take 1 tablet by mouth daily.    [provider]      Allergies    Codeine    Review of Systems   Review of Systems  Cardiovascular:  Positive for near-syncope. Negative for chest pain.  Gastrointestinal:  Negative for abdominal pain.  All other systems reviewed and are negative.  Physical Exam Updated Vital Signs BP 129/74 (BP Location: Left Arm)    Pulse 63    Temp 97.9 F (36.6 C) (Oral)    Resp 20    Ht 5\' 2"  (1.575 m)    Wt 51.3 kg    SpO2 99%    BMI 20.67 kg/m  Physical Exam  ED Results / Procedures / Treatments   Labs (all labs ordered are listed, but only abnormal results are displayed) Labs Reviewed  RESP PANEL BY RT-PCR (FLU A&B, COVID) ARPGX2 - Abnormal; Notable for the following components:      Result Value   Influenza A by PCR POSITIVE (*)    All other components within normal limits  COMPREHENSIVE METABOLIC PANEL - Abnormal; Notable for the following components:   Potassium 3.3 (*)    Albumin 3.3 (*)    All other components within normal limits  CBC WITH DIFFERENTIAL/PLATELET -  Abnormal; Notable for the following components:   RBC 3.80 (*)    Hemoglobin 10.9 (*)    HCT 34.7 (*)    Neutro Abs 1.6 (*)    All other components within normal limits    EKG EKG Interpretation  Date/Time:  Saturday May 13 2021 14:09:25 EST Ventricular Rate:  73 PR Interval:  220 QRS Duration: 78 QT Interval:  395 QTC Calculation: 436 R Axis:   0 Text Interpretation: Sinus rhythm Prolonged PR interval Abnormal R-wave progression, early transition Confirmed by Dene Gentry [phone]) on 05/13/2021 2:20:37 PM  Radiology DG Chest Port 1 View  Result Date: 05/13/2021 CLINICAL DATA:  Cough.  Syncopal episode today. EXAM: PORTABLE CHEST  1 VIEW COMPARISON:  12/17/2019. FINDINGS: Cardiac silhouette is normal in size. Normal mediastinal and hilar contours. Clear lungs.  No pleural effusion or pneumothorax. Skeletal structures are grossly intact. IMPRESSION: No active disease. Electronically Signed   By: Lajean Manes M.D.   On: 05/13/2021 14:53    Procedures Procedures    Medications Ordered in ED Medications  sodium chloride 0.9 % bolus 500 mL (has no administration in time range)    ED Course/ Medical Decision Making/ A&P Clinical Course as of 05/13/21 1609  Sat May 13, 2021  1420 EKG 12-Lead [PM]    Clinical Course User Index [PM] Valarie Merino, MD                           Medical Decision Making Amount and/or Complexity of Data Reviewed Labs: ordered. Radiology: ordered. ECG/medicine tests:  Decision-making details documented in ED Course.  Risk Prescription drug management.    Medical Screen Complete  This patient presented to the ED with complaint of weakness, near-syncope, fatigue, cough.  This complaint involves an extensive number of treatment options. The initial differential diagnosis includes, but is not limited to, viral illness, bacterial infection, pneumonia, metabolic abnormality, AKI, hyponatremia,etc.  This presentation is: Acute, Previously Undiagnosed, Uncertain Prognosis, Complicated, Systemic Symptoms, and Threat to Life/Bodily Function  Patient presented with complaint of near syncopal event while shopping at [facility].  She reports approximately 1 week of upper respiratory symptoms concerning for possible viral infection.  Patient is testing today does reveal positive flu test.  Other work-up is without evidence of significant acute pathology  Patient does feel improved after IV fluids and replacement of potassium.  She desires DC home.  She does understand need for close follow-up.   Co morbidities that complicated the patient's evaluation  Hx of tobacco use, GERD, HLD,  Esophageal stenosis and dilation   Additional history obtained:  External records from outside sources obtained and reviewed including prior ED visits and prior Inpatient records.    Lab Tests:  I ordered and personally interpreted labs.  The pertinent results include:  cbc cmp covid/flu   Imaging Studies ordered:  I ordered imaging studies including cxr  I independently visualized and interpreted obtained imaging which showed NAD I agree with the radiologist interpretation.   Cardiac Monitoring:  The patient was maintained on a cardiac monitor.  I personally viewed and interpreted the cardiac monitor which showed an underlying rhythm of: NSR   Medicines ordered:  I ordered medication including potassium  for hypokalemia  Reevaluation of the patient after these medicines showed that the patient: improved      Problem List / ED Course:  Near syncope, URI, Influenza   Reevaluation:  After the interventions noted above, I reevaluated the  patient and found that they have: improved     Disposition:  After consideration of the diagnostic results and the patients response to treatment, I feel that the patent would benefit from close outpatient follow up.          Final Clinical Impression(s) / ED Diagnoses Final diagnoses:  Near syncope  Influenza  Hypokalemia    Rx / DC Orders ED Discharge Orders     None         Valarie Merino, MD 05/13/21 [phone]

## 2021-05-13 NOTE — Discharge Instructions (Signed)
Return for any problem.  ?

## 2021-05-13 NOTE — ED Triage Notes (Signed)
Pt bib ems for near syncope.  Pt was at [place] and got lightheaded. Pt was assisted to chair, pt did not fall, pt states "I did black out."  Pt given IV fluid bolus by ems for low bp upon ems arrival. Pt vitally stable upon arrival to ED room.

## 2021-07-03 DIAGNOSIS — K6289 Other specified diseases of anus and rectum: Secondary | ICD-10-CM | POA: Diagnosis not present

## 2021-07-03 DIAGNOSIS — K5900 Constipation, unspecified: Secondary | ICD-10-CM | POA: Diagnosis not present

## 2021-07-05 DIAGNOSIS — J302 Other seasonal allergic rhinitis: Secondary | ICD-10-CM | POA: Diagnosis not present

## 2021-07-05 DIAGNOSIS — E782 Mixed hyperlipidemia: Secondary | ICD-10-CM | POA: Diagnosis not present

## 2021-07-05 DIAGNOSIS — K581 Irritable bowel syndrome with constipation: Secondary | ICD-10-CM | POA: Diagnosis not present

## 2021-07-05 DIAGNOSIS — Z720 Tobacco use: Secondary | ICD-10-CM | POA: Diagnosis not present

## 2021-07-05 DIAGNOSIS — M1990 Unspecified osteoarthritis, unspecified site: Secondary | ICD-10-CM | POA: Diagnosis not present

## 2021-07-05 DIAGNOSIS — K219 Gastro-esophageal reflux disease without esophagitis: Secondary | ICD-10-CM | POA: Diagnosis not present

## 2021-07-10 NOTE — Progress Notes (Signed)
?Cardiology Office Note:   ? ?Date:  07/12/2021  ? ?ID:  Sarah Moore, DOB 1951-02-25, MRN [id number] ? ?PCP:  Trey Sailors, PA  ?[facility] Cardiologist:  Werner Lean, MD  ?[facility] Electrophysiologist:  None  ? ?Referring MD: Trey Sailors, PA  ? ?CC: Chest pain follow up ? ?History of Present Illness:   ? ?Sarah Moore is a 71 y.o. female with a hx of GERD, esophageal stenosis and dilation in the past, HLD, who is smoke free for the past 6 months who presented in 2022. ?2022:  Negative stress test with hypertensive response.normal BNP. ? ?Patient notes that she is doing better now.   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 ?Went to PCP and found to have hypoxia, then  ?Went to get some medication at and passed out at [place] ?ED: near syncope at [facility]. ?She notes that when she goes to any [employer], she always feels a bit stuffy. ? ?Still has some chest discomfort  No SOB/DOE and no PND/Orthopnea.  No weight gain or leg swelling.  Still has rare palpitations. ?Stopped smoking (has restarted then stopped again) ? ? ?Past Medical History:  ?Diagnosis Date  ? Acid reflux   ? Arthritis   ? Chest pain   ? Fatigue   ? GERD (gastroesophageal reflux disease)   ? Hyperlipidemia   ? ? ?Past Surgical History:  ?Procedure Laterality Date  ? ABDOMINAL HYSTERECTOMY    ? COLONOSCOPY    ? KNEE ARTHROPLASTY Left   ? ? ?Current Medications: ?Current Meds  ?Medication Sig  ? cycloSPORINE (RESTASIS) 0.05 % ophthalmic emulsion Place 1 drop into both eyes 2 (two) times daily as needed (for dry eyes).  ? doxycycline (VIBRA-TABS) 100 MG tablet   ? erythromycin ophthalmic ointment at bedtime.  ? gabapentin (NEURONTIN) 300 MG capsule as needed.  ? Glycerin-Polysorbate 80 (REFRESH DRY EYE THERAPY OP) Apply 1 drop to eye daily as needed.  ? hydrOXYzine (ATARAX) 25 MG tablet as needed.  ? levocetirizine (XYZAL) 5 MG tablet as needed.  ?  Multiple Vitamin (MULTIVITAMIN WITH MINERALS) TABS tablet Take 1 tablet by mouth daily.  ? valACYclovir (VALTREX) 500 MG tablet as needed.  ?  ? ?Allergies:   Codeine  ? ?Social History  ? ?Socioeconomic History  ? Marital status: Single  ?  Spouse name: Not on file  ? Number of children: 3  ? Years of education: Not on file  ? Highest education level: Not on file  ?Occupational History  ? Occupation: retired  ?Tobacco Use  ? Smoking status: Former  ?  Packs/day: 0.25  ?  Types: Cigarettes  ? Smokeless tobacco: Never  ?Substance and Sexual Activity  ? Alcohol use: No  ?  Alcohol/week: 0.0 standard drinks  ? Drug use: No  ? Sexual activity: Not on file  ?Other Topics Concern  ? Not on file  ?Social History Narrative  ? She says she is single she has 3 sons and one daughter. She works part-time at [employer] but is otherwise retired.11/02/2014  ? ?Social Determinants of Health  ? ?Financial Resource Strain: Not on file  ?Food Insecurity: Not on file  ?Transportation Needs: Not on file  ?Physical Activity: Not on file  ?Stress: Not on file  ?Social Connections: Not on file  ?  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 ?Family History: ?The patient's family  history includes Asthma in her brother; Clotting disorder in her son; Stomach cancer in her brother. She was adopted. ? ?ROS:   ?Please see the history of present illness.    ?All other systems reviewed and are negative. ? ?EKGs/Labs/Other Studies Reviewed:   ? ?The following studies were reviewed today: ? ?EKG:   ?05/23/2020: SR Rate 88 WNL  ? ?NM Stress Testing: ?Date: 06/06/20 ?Results: ?The left ventricular ejection fraction is hyperdynamic (>65%). ?Nuclear stress EF: 75%. ?Blood pressure demonstrated a hypertensive response to exercise. ?There was no ST segment deviation noted during stress. ?The study is normal. ?This is a low risk study. ?  ?Negative study for ischemia or infarction. ? ?Recent Labs: ?05/13/2021: ALT 22; BUN 16; Creatinine, Ser 0.60;  Hemoglobin 10.9; Platelets 184; Potassium 3.3; Sodium 140  ?Recent Lipid Panel ?   ?Component Value Date/Time  ? CHOL 212 (H) 02/10/2009 1947  ? TRIG 74 02/10/2009 1947  ? HDL 63 02/10/2009 1947  ? CHOLHDL 3.4 Ratio 02/10/2009 1947  ? VLDL 15 02/10/2009 1947  ? [place] 134 (H) 02/10/2009 1947  ? ?Outside Hospital  or Outside Clinic Studies (OSH):  ?Date: 09/01/2018 ?Cholesterol 253 ?HDL 85 ?LDL 162 ?Tgs 103 ?Creatinine 0.52 ?TSH 1.89 ?BNP NA ? ? ?Physical Exam:   ? ?VS:  BP 115/82   Pulse 85   Ht 5' (1.524 m)   Wt 118 lb (53.5 kg)   SpO2 99%   BMI 23.05 kg/m?    ? ?Wt Readings from Last 3 Encounters:  ?07/12/21 118 lb (53.5 kg)  ?05/13/21 113 lb (51.3 kg)  ?10/10/20 124 lb (56.2 kg)  ?  ? ?Gen: no distress   ?Neck: No JVD  ?Cardiac: No Rubs or Gallops, no Murmur, RRR +2 radial pulses ?Respiratory: Clear to auscultation bilaterally, normal effort, normal  respiratory rate ?GI: Soft, nontender, non-distended  ?MS: No  edema;  moves all extremities ?Integument: Skin feels warm ?Neuro:  At time of evaluation, alert and oriented to person/place/time/situation  ?Psych: Normal affect, patient feels OK ? ? ?ASSESSMENT:   ? ?1. Syncope, unspecified syncope type   ?2. Hyperlipidemia, unspecified hyperlipidemia type   ?3. Former tobacco use   ?4. Palpitations   ? ? ?PLAN:   ? ?Syncope ?Palpitations ?HLD ?Recent influenza infection ?Former Tobacco Abuse ?- No further BP elevations ?- no change in feeling of her chest as with negative stress test ?- will get echo and 14 day non live zio-patch ?- if still having chest sx at follow up or if escalation, will get CCTA ?- lipids at next visit for primary prevention ? ?APP summer me in one year ? ? ?Medication Adjustments/Labs and Tests Ordered: ?Current medicines are reviewed at length with the patient today.  Concerns regarding medicines are outlined above.  ?Orders Placed This Encounter  ?Procedures  ? LONG TERM MONITOR (3-14 DAYS)  ? ECHOCARDIOGRAM COMPLETE  ? ? ?No orders of  the defined types were placed in this encounter. ? ? ? ?Patient Instructions  ?Medication Instructions:  ?Your physician recommends that you continue on your current medications as directed. Please refer to the Current Medication list given to you today. ? ?*If you need a refill on your cardiac medications before your next appointment, please call your pharmacy* ? ? ?Lab Work: ?NONE ?If you have labs (blood work) drawn today and your tests are completely normal, you will receive your results only by: ?MyChart Message (if you have MyChart) OR ?A paper copy in the mail ?If you have  any lab test that is abnormal or we need to change your treatment, we will call you to review the results. ? ? ?Testing/Procedures: ?Your physician has requested that you have an echocardiogram. Echocardiography is a painless test that uses sound waves to create images of your heart. It provides your doctor with information about the size and shape of your heart and how well your heart?s chambers and valves are working. This procedure takes approximately one hour. There are no restrictions for this procedure. ? ?Your physician has requested that you wear a 14 day heart monitor.  ? ? ?Follow-Up: ?At [facility], you and your health needs are our priority.  As part of our continuing mission to provide you with exceptional heart care, we have created designated Provider Care Teams.  These Care Teams include your primary Cardiologist (physician) and Advanced Practice Providers (APPs -  Physician Assistants and Nurse Practitioners) who all work together to provide you with the care you need, when you need it. ? ? ?Your next appointment:   ?4-5 month(s) ? ?The format for your next appointment:   ?In Person ? ?Provider:   ?Melina Copa, PA-C, Ermalinda Barrios, PA-C, or Christen Bame, NP     Then, Werner Lean, MD will plan to see you again in 12 month(s).  ? ? ?Other Instructions ? ?ZIO XT- Long Term Monitor Instructions ? ?Your  physician has requested you wear a ZIO patch monitor for 14 days.  ?This is a single patch monitor. [employer] supplies one patch monitor per enrollment. Additional ?stickers are not available. Please do not apply patch if you wi

## 2021-07-12 ENCOUNTER — Ambulatory Visit (INDEPENDENT_AMBULATORY_CARE_PROVIDER_SITE_OTHER): Payer: Medicare HMO | Admitting: Internal Medicine

## 2021-07-12 ENCOUNTER — Encounter: Payer: Self-pay | Admitting: Internal Medicine

## 2021-07-12 ENCOUNTER — Other Ambulatory Visit: Payer: Self-pay

## 2021-07-12 ENCOUNTER — Ambulatory Visit (INDEPENDENT_AMBULATORY_CARE_PROVIDER_SITE_OTHER): Payer: Medicare HMO

## 2021-07-12 VITALS — BP 115/82 | HR 85 | Ht 60.0 in | Wt 118.0 lb

## 2021-07-12 DIAGNOSIS — R55 Syncope and collapse: Secondary | ICD-10-CM | POA: Diagnosis not present

## 2021-07-12 DIAGNOSIS — Z87891 Personal history of nicotine dependence: Secondary | ICD-10-CM

## 2021-07-12 DIAGNOSIS — R002 Palpitations: Secondary | ICD-10-CM | POA: Diagnosis not present

## 2021-07-12 DIAGNOSIS — E785 Hyperlipidemia, unspecified: Secondary | ICD-10-CM

## 2021-07-12 NOTE — Patient Instructions (Signed)
Medication Instructions:  ?Your physician recommends that you continue on your current medications as directed. Please refer to the Current Medication list given to you today. ? ?*If you need a refill on your cardiac medications before your next appointment, please call your pharmacy* ? ? ?Lab Work: ?NONE ?If you have labs (blood work) drawn today and your tests are completely normal, you will receive your results only by: ?MyChart Message (if you have MyChart) OR ?A paper copy in the mail ?If you have any lab test that is abnormal or we need to change your treatment, we will call you to review the results. ? ? ?Testing/Procedures: ?Your physician has requested that you have an echocardiogram. Echocardiography is a painless test that uses sound waves to create images of your heart. It provides your doctor with information about the size and shape of your heart and how well your heart?s chambers and valves are working. This procedure takes approximately one hour. There are no restrictions for this procedure. ? ?Your physician has requested that you wear a 14 day heart monitor.  ? ? ?Follow-Up: ?At [facility], you and your health needs are our priority.  As part of our continuing mission to provide you with exceptional heart care, we have created designated Provider Care Teams.  These Care Teams include your primary Cardiologist (physician) and Advanced Practice Providers (APPs -  Physician Assistants and Nurse Practitioners) who all work together to provide you with the care you need, when you need it. ? ? ?Your next appointment:   ?4-5 month(s) ? ?The format for your next appointment:   ?In Person ? ?Provider:   ?Melina Copa, PA-C, Ermalinda Barrios, PA-C, or Christen Bame, NP     Then, Werner Lean, MD will plan to see you again in 12 month(s).  ? ? ?Other Instructions ? ?ZIO XT- Long Term Monitor Instructions ? ?Your physician has requested you wear a ZIO patch monitor for 14 days.  ?This is a single  patch monitor. [employer] supplies one patch monitor per enrollment. Additional ?stickers are not available. Please do not apply patch if you will be having a Nuclear Stress Test,  ?Echocardiogram, Cardiac CT, MRI, or Chest Xray during the period you would be wearing the  ?monitor. The patch cannot be worn during these tests. You cannot remove and re-apply the  ?ZIO XT patch monitor.  ?Your ZIO patch monitor will be mailed 3 day USPS to your address on file. It may take 3-5 days  ?to receive your monitor after you have been enrolled.  ?Once you have received your monitor, please review the enclosed instructions. Your monitor  ?has already been registered assigning a specific monitor serial # to you. ? ?Billing and Patient Assistance Program Information ? ?We have supplied [employer] with any of your insurance information on file for billing purposes. ?[employer] offers a sliding scale Patient Assistance Program for patients that do not have  ?insurance, or whose insurance does not completely cover the cost of the ZIO monitor.  ?You must apply for the Patient Assistance Program to qualify for this discounted rate.  ?To apply, please call [employer] at [phone], select option 4, select option 2, ask to apply for  ?Patient Assistance Program. Theodore Demark will ask your household income, and how many people  ?are in your household. They will quote your out-of-pocket cost based on that information.  ?[employer] will also be able to set up a 40-month interest-free payment plan if needed. ? ?Applying the monitor ?  ?  Shave hair from upper left chest.  ?Hold abrader disc by orange tab. Rub abrader in 40 strokes over the upper left chest as  ?indicated in your monitor instructions.  ?Clean area with 4 enclosed alcohol pads. Let dry.  ?Apply patch as indicated in monitor instructions. Patch will be placed under collarbone on left  ?side of chest with arrow pointing upward.  ?Rub patch adhesive wings for 2 minutes. Remove white label marked  "1". Remove the white  ?label marked "2". Rub patch adhesive wings for 2 additional minutes.  ?While looking in a mirror, press and release button in center of patch. A small green light will  ?flash 3-4 times. This will be your only indicator that the monitor has been turned on.  ?Do not shower for the first 24 hours. You may shower after the first 24 hours.  ?Press the button if you feel a symptom. You will hear a small click. Record Date, Time and  ?Symptom in the Patient Logbook.  ?When you are ready to remove the patch, follow instructions on the last 2 pages of Patient  ?Logbook. Stick patch monitor onto the last page of Patient Logbook.  ?Place Patient Logbook in the blue and white box. Use locking tab on box and tape box closed  ?securely. The blue and white box has prepaid postage on it. Please place it in the mailbox as  ?soon as possible. Your physician should have your test results approximately 7 days after the  ?monitor has been mailed back to [facility].  ?Call [facility] at [phone] if you have questions regarding  ?your ZIO XT patch monitor. Call them immediately if you see an orange light blinking on your  ?monitor.  ?If your monitor falls off in less than 4 days, contact our Monitor department at [phone].  ?If your monitor becomes loose or falls off after 4 days call [employer] at [phone] for  ?suggestions on securing your monitor   ?

## 2021-07-12 NOTE — Progress Notes (Unsigned)
Applied a 14 day Zio XT monitor to patient in the office ?

## 2021-07-31 DIAGNOSIS — K5900 Constipation, unspecified: Secondary | ICD-10-CM | POA: Diagnosis not present

## 2021-08-01 ENCOUNTER — Ambulatory Visit (HOSPITAL_COMMUNITY): Payer: Medicare HMO | Attending: Cardiology

## 2021-08-01 DIAGNOSIS — R55 Syncope and collapse: Secondary | ICD-10-CM

## 2021-08-01 LAB — ECHOCARDIOGRAM COMPLETE
Area-P 1/2: 3.72 cm2
S' Lateral: 2.25 cm

## 2021-08-09 ENCOUNTER — Ambulatory Visit: Payer: Medicare HMO | Admitting: Physician Assistant

## 2021-08-17 DIAGNOSIS — R55 Syncope and collapse: Secondary | ICD-10-CM | POA: Diagnosis not present

## 2021-08-23 DIAGNOSIS — R208 Other disturbances of skin sensation: Secondary | ICD-10-CM | POA: Diagnosis not present

## 2021-08-23 DIAGNOSIS — L298 Other pruritus: Secondary | ICD-10-CM | POA: Diagnosis not present

## 2021-08-23 DIAGNOSIS — L820 Inflamed seborrheic keratosis: Secondary | ICD-10-CM | POA: Diagnosis not present

## 2021-08-23 DIAGNOSIS — L538 Other specified erythematous conditions: Secondary | ICD-10-CM | POA: Diagnosis not present

## 2021-08-23 DIAGNOSIS — Z789 Other specified health status: Secondary | ICD-10-CM | POA: Diagnosis not present

## 2021-09-15 DIAGNOSIS — D23112 Other benign neoplasm of skin of right lower eyelid, including canthus: Secondary | ICD-10-CM | POA: Diagnosis not present

## 2021-09-15 DIAGNOSIS — D485 Neoplasm of uncertain behavior of skin: Secondary | ICD-10-CM | POA: Diagnosis not present

## 2021-11-02 DIAGNOSIS — K581 Irritable bowel syndrome with constipation: Secondary | ICD-10-CM | POA: Diagnosis not present

## 2021-11-02 DIAGNOSIS — Z720 Tobacco use: Secondary | ICD-10-CM | POA: Diagnosis not present

## 2021-11-02 DIAGNOSIS — J302 Other seasonal allergic rhinitis: Secondary | ICD-10-CM | POA: Diagnosis not present

## 2021-11-02 DIAGNOSIS — E782 Mixed hyperlipidemia: Secondary | ICD-10-CM | POA: Diagnosis not present

## 2021-11-02 DIAGNOSIS — R7309 Other abnormal glucose: Secondary | ICD-10-CM | POA: Diagnosis not present

## 2021-11-02 DIAGNOSIS — Z0000 Encounter for general adult medical examination without abnormal findings: Secondary | ICD-10-CM | POA: Diagnosis not present

## 2021-11-02 DIAGNOSIS — Z131 Encounter for screening for diabetes mellitus: Secondary | ICD-10-CM | POA: Diagnosis not present

## 2021-11-02 DIAGNOSIS — K219 Gastro-esophageal reflux disease without esophagitis: Secondary | ICD-10-CM | POA: Diagnosis not present

## 2021-11-02 DIAGNOSIS — M1990 Unspecified osteoarthritis, unspecified site: Secondary | ICD-10-CM | POA: Diagnosis not present

## 2021-11-14 DIAGNOSIS — Z0001 Encounter for general adult medical examination with abnormal findings: Secondary | ICD-10-CM | POA: Diagnosis not present

## 2021-11-14 DIAGNOSIS — K219 Gastro-esophageal reflux disease without esophagitis: Secondary | ICD-10-CM | POA: Diagnosis not present

## 2021-11-14 DIAGNOSIS — Z720 Tobacco use: Secondary | ICD-10-CM | POA: Diagnosis not present

## 2021-11-14 DIAGNOSIS — K581 Irritable bowel syndrome with constipation: Secondary | ICD-10-CM | POA: Diagnosis not present

## 2021-11-14 DIAGNOSIS — E782 Mixed hyperlipidemia: Secondary | ICD-10-CM | POA: Diagnosis not present

## 2021-11-14 DIAGNOSIS — J302 Other seasonal allergic rhinitis: Secondary | ICD-10-CM | POA: Diagnosis not present

## 2021-11-14 DIAGNOSIS — M1990 Unspecified osteoarthritis, unspecified site: Secondary | ICD-10-CM | POA: Diagnosis not present

## 2021-12-05 ENCOUNTER — Ambulatory Visit: Payer: Medicare HMO | Admitting: Physician Assistant

## 2021-12-12 DIAGNOSIS — E782 Mixed hyperlipidemia: Secondary | ICD-10-CM | POA: Diagnosis not present

## 2021-12-12 DIAGNOSIS — J302 Other seasonal allergic rhinitis: Secondary | ICD-10-CM | POA: Diagnosis not present

## 2021-12-12 DIAGNOSIS — K219 Gastro-esophageal reflux disease without esophagitis: Secondary | ICD-10-CM | POA: Diagnosis not present

## 2021-12-12 DIAGNOSIS — M1990 Unspecified osteoarthritis, unspecified site: Secondary | ICD-10-CM | POA: Diagnosis not present

## 2021-12-12 DIAGNOSIS — K581 Irritable bowel syndrome with constipation: Secondary | ICD-10-CM | POA: Diagnosis not present

## 2021-12-12 DIAGNOSIS — Z720 Tobacco use: Secondary | ICD-10-CM | POA: Diagnosis not present

## 2022-02-28 DIAGNOSIS — H2513 Age-related nuclear cataract, bilateral: Secondary | ICD-10-CM | POA: Diagnosis not present

## 2022-03-31 DIAGNOSIS — Z1231 Encounter for screening mammogram for malignant neoplasm of breast: Secondary | ICD-10-CM | POA: Diagnosis not present

## 2022-04-13 DIAGNOSIS — K219 Gastro-esophageal reflux disease without esophagitis: Secondary | ICD-10-CM | POA: Diagnosis not present

## 2022-04-13 DIAGNOSIS — K581 Irritable bowel syndrome with constipation: Secondary | ICD-10-CM | POA: Diagnosis not present

## 2022-04-13 DIAGNOSIS — R051 Acute cough: Secondary | ICD-10-CM | POA: Diagnosis not present

## 2022-04-13 DIAGNOSIS — M1990 Unspecified osteoarthritis, unspecified site: Secondary | ICD-10-CM | POA: Diagnosis not present

## 2022-04-13 DIAGNOSIS — E782 Mixed hyperlipidemia: Secondary | ICD-10-CM | POA: Diagnosis not present

## 2022-04-13 DIAGNOSIS — Z720 Tobacco use: Secondary | ICD-10-CM | POA: Diagnosis not present

## 2022-04-13 DIAGNOSIS — J302 Other seasonal allergic rhinitis: Secondary | ICD-10-CM | POA: Diagnosis not present

## 2022-05-01 DIAGNOSIS — E782 Mixed hyperlipidemia: Secondary | ICD-10-CM | POA: Diagnosis not present

## 2022-05-01 DIAGNOSIS — K219 Gastro-esophageal reflux disease without esophagitis: Secondary | ICD-10-CM | POA: Diagnosis not present

## 2022-05-01 DIAGNOSIS — K581 Irritable bowel syndrome with constipation: Secondary | ICD-10-CM | POA: Diagnosis not present

## 2022-05-01 DIAGNOSIS — Z720 Tobacco use: Secondary | ICD-10-CM | POA: Diagnosis not present

## 2022-05-01 DIAGNOSIS — M1990 Unspecified osteoarthritis, unspecified site: Secondary | ICD-10-CM | POA: Diagnosis not present

## 2022-05-01 DIAGNOSIS — J302 Other seasonal allergic rhinitis: Secondary | ICD-10-CM | POA: Diagnosis not present

## 2022-05-01 DIAGNOSIS — R7309 Other abnormal glucose: Secondary | ICD-10-CM | POA: Diagnosis not present

## 2022-05-01 DIAGNOSIS — R55 Syncope and collapse: Secondary | ICD-10-CM | POA: Diagnosis not present

## 2022-05-15 DIAGNOSIS — K219 Gastro-esophageal reflux disease without esophagitis: Secondary | ICD-10-CM | POA: Diagnosis not present

## 2022-05-15 DIAGNOSIS — K581 Irritable bowel syndrome with constipation: Secondary | ICD-10-CM | POA: Diagnosis not present

## 2022-05-15 DIAGNOSIS — J302 Other seasonal allergic rhinitis: Secondary | ICD-10-CM | POA: Diagnosis not present

## 2022-05-15 DIAGNOSIS — Z720 Tobacco use: Secondary | ICD-10-CM | POA: Diagnosis not present

## 2022-05-15 DIAGNOSIS — E782 Mixed hyperlipidemia: Secondary | ICD-10-CM | POA: Diagnosis not present

## 2022-05-15 DIAGNOSIS — M1990 Unspecified osteoarthritis, unspecified site: Secondary | ICD-10-CM | POA: Diagnosis not present

## 2022-07-09 DIAGNOSIS — Z0100 Encounter for examination of eyes and vision without abnormal findings: Secondary | ICD-10-CM | POA: Diagnosis not present

## 2022-07-09 DIAGNOSIS — R634 Abnormal weight loss: Secondary | ICD-10-CM | POA: Diagnosis not present

## 2022-07-09 DIAGNOSIS — K625 Hemorrhage of anus and rectum: Secondary | ICD-10-CM | POA: Diagnosis not present

## 2022-07-09 DIAGNOSIS — K5900 Constipation, unspecified: Secondary | ICD-10-CM | POA: Diagnosis not present

## 2022-07-24 DIAGNOSIS — K921 Melena: Secondary | ICD-10-CM | POA: Diagnosis not present

## 2022-07-24 DIAGNOSIS — K625 Hemorrhage of anus and rectum: Secondary | ICD-10-CM | POA: Diagnosis not present

## 2022-07-30 ENCOUNTER — Other Ambulatory Visit (HOSPITAL_COMMUNITY): Payer: Self-pay | Admitting: Gastroenterology

## 2022-07-30 DIAGNOSIS — K600 Acute anal fissure: Secondary | ICD-10-CM | POA: Diagnosis not present

## 2022-07-30 DIAGNOSIS — R634 Abnormal weight loss: Secondary | ICD-10-CM | POA: Diagnosis not present

## 2022-07-30 DIAGNOSIS — K6289 Other specified diseases of anus and rectum: Secondary | ICD-10-CM | POA: Diagnosis not present

## 2022-07-30 DIAGNOSIS — K625 Hemorrhage of anus and rectum: Secondary | ICD-10-CM | POA: Diagnosis not present

## 2022-07-30 DIAGNOSIS — K5900 Constipation, unspecified: Secondary | ICD-10-CM | POA: Diagnosis not present

## 2022-08-02 ENCOUNTER — Ambulatory Visit (INDEPENDENT_AMBULATORY_CARE_PROVIDER_SITE_OTHER): Payer: Medicare HMO

## 2022-08-02 ENCOUNTER — Telehealth (HOSPITAL_COMMUNITY): Payer: Self-pay | Admitting: Urgent Care

## 2022-08-02 ENCOUNTER — Ambulatory Visit (HOSPITAL_COMMUNITY)
Admission: RE | Admit: 2022-08-02 | Discharge: 2022-08-02 | Disposition: A | Discharge status: Home / Self Care | Payer: Medicare HMO | Source: Ambulatory Visit | Attending: Urgent Care | Admitting: Urgent Care

## 2022-08-02 ENCOUNTER — Encounter (HOSPITAL_COMMUNITY): Payer: Self-pay

## 2022-08-02 VITALS — BP 109/77 | HR 95 | Temp 98.1°F | Resp 18

## 2022-08-02 DIAGNOSIS — K602 Anal fissure, unspecified: Secondary | ICD-10-CM | POA: Diagnosis not present

## 2022-08-02 DIAGNOSIS — R109 Unspecified abdominal pain: Secondary | ICD-10-CM

## 2022-08-02 DIAGNOSIS — K5900 Constipation, unspecified: Secondary | ICD-10-CM | POA: Insufficient documentation

## 2022-08-02 DIAGNOSIS — R1032 Left lower quadrant pain: Secondary | ICD-10-CM | POA: Diagnosis not present

## 2022-08-02 LAB — CBC WITH DIFFERENTIAL/PLATELET
Abs Immature Granulocytes: 0.02 10*3/uL (ref 0.00–0.07)
Basophils Absolute: 0 10*3/uL (ref 0.0–0.1)
Basophils Relative: 0 %
Eosinophils Absolute: 0.1 10*3/uL (ref 0.0–0.5)
Eosinophils Relative: 1 %
HCT: 35.8 % — ABNORMAL LOW (ref 36.0–46.0)
Hemoglobin: 11.8 g/dL — ABNORMAL LOW (ref 12.0–15.0)
Immature Granulocytes: 0 %
Lymphocytes Relative: 45 %
Lymphs Abs: 2.9 10*3/uL (ref 0.7–4.0)
MCH: 29.1 pg (ref 26.0–34.0)
MCHC: 33 g/dL (ref 30.0–36.0)
MCV: 88.2 fL (ref 80.0–100.0)
Monocytes Absolute: 0.4 10*3/uL (ref 0.1–1.0)
Monocytes Relative: 6 %
Neutro Abs: 3.1 10*3/uL (ref 1.7–7.7)
Neutrophils Relative %: 48 %
Platelets: 254 10*3/uL (ref 150–400)
RBC: 4.06 MIL/uL (ref 3.87–5.11)
RDW: 13.7 % (ref 11.5–15.5)
WBC: 6.4 10*3/uL (ref 4.0–10.5)
nRBC: 0 % (ref 0.0–0.2)

## 2022-08-02 LAB — BASIC METABOLIC PANEL
Anion gap: 12 (ref 5–15)
BUN: 8 mg/dL (ref 8–23)
CO2: 27 mmol/L (ref 22–32)
Calcium: 9.8 mg/dL (ref 8.9–10.3)
Chloride: 102 mmol/L (ref 98–111)
Creatinine, Ser: 0.55 mg/dL (ref 0.44–1.00)
GFR, Estimated: >60 mL/min (ref >60)
Glucose, Bld: 86 mg/dL (ref 70–99)
Potassium: 3.1 mmol/L — ABNORMAL LOW (ref 3.5–5.1)
Sodium: 141 mmol/L (ref 135–145)

## 2022-08-02 MED ORDER — LACTULOSE 10 GM/15ML PO SOLN: 10.0000 g | Freq: Two times a day (BID) | ORAL | 0 refills | Status: DC | PRN

## 2022-08-02 NOTE — ED Triage Notes (Signed)
Pt c/o hx abdominal with constipation and bleeding for over a month. States saw her PCP and had a flex procedure and dx with hemorrhoids. States has a schedule CT scan for 4/29 and would like to have it done sooner. States saw her PCP on Monday and was given cream from hemorrhoids.

## 2022-08-02 NOTE — ED Provider Notes (Signed)
[facility]    CSN: [id number] Arrival date & time: 08/02/22  4098      History   Chief Complaint Chief Complaint  Patient presents with   Abdominal Pain    Entered by patient   Constipation    HPI Sarah Moore is a 72 y.o. female.   72 year old female presents today due to concerns of continued abdominal discomfort and constipation.  Chart review shows that patient has had this constipation issue for at least over a year, for which she follows with Dr. Jeani Hawking, gastroenterology.  Patient states however in the past 1 month, she has had worsening constipation and left lower quadrant abdominal pain.  Within the past week, she had a flexible sigmoidoscopy which was normal.  She states every time she eats she gets severe pain to the point where she is scared to eat.  She reports weight loss due to decreased appetite.  She has been taking numerous medications to help with her constipation, including samples of Linzess.  Patient states all of them have been ineffective.  She also reports using over-the-counter Fleet enema.  She has been doing this daily.  She was also recently seen due to hematochezia by her gastroenterologist, and was diagnosed with an internal hemorrhoid and an anal fissure.  Over the past few days she has been using topical nitroglycerin ointment without adverse effects. She currently has a CT scan scheduled on 08/12/22.   Abdominal Pain Associated symptoms: constipation   Constipation Associated symptoms: abdominal pain     Past Medical History:  Diagnosis Date   Acid reflux    Arthritis    Chest pain    Fatigue    GERD (gastroesophageal reflux disease)    Hyperlipidemia     Patient Active Problem List   Diagnosis Date Noted   Syncope 07/12/2021   Palpitations 10/10/2020   Former tobacco use 10/10/2020   Hyperlipidemia 05/23/2020   CONSTIPATION, CHRONIC 02/18/2007   OSTEOARTHRITIS 02/18/2007   DVT, HX OF 02/18/2007   ARTHROSCOPY, KNEE,  HX OF 02/18/2007    Past Surgical History:  Procedure Laterality Date   ABDOMINAL HYSTERECTOMY     COLONOSCOPY     KNEE ARTHROPLASTY Left     OB History   No obstetric history on file.      Home Medications    Prior to Admission medications   Medication Sig Start Date End Date Taking? Authorizing Provider  lactulose (CHRONULAC) 10 GM/15ML solution Take 15 mLs (10 g total) by mouth 2 (two) times daily as needed for moderate constipation or severe constipation. 08/02/22  Yes Maral Lampe L, PA  cycloSPORINE (RESTASIS) 0.05 % ophthalmic emulsion Place 1 drop into both eyes 2 (two) times daily as needed (for dry eyes).    [provider]  erythromycin ophthalmic ointment at bedtime. 03/27/21   [provider]  gabapentin (NEURONTIN) 300 MG capsule as needed.    [provider]  Glycerin-Polysorbate 80 (REFRESH DRY EYE THERAPY OP) Apply 1 drop to eye daily as needed.    [provider]  hydrOXYzine (ATARAX) 25 MG tablet as needed.    [provider]  levocetirizine (XYZAL) 5 MG tablet as needed. 07/04/20   [provider]  Multiple Vitamin (MULTIVITAMIN WITH MINERALS) TABS tablet Take 1 tablet by mouth daily.    [provider]  valACYclovir (VALTREX) 500 MG tablet as needed. 11/23/20   [provider]    Family History Family History  Adopted: Yes  Problem  Relation Age of Onset   Asthma Brother    Stomach cancer Brother    Clotting disorder Son        blood clot    Social History Social History   Tobacco Use   Smoking status: Former    Packs/day: .25    Types: Cigarettes   Smokeless tobacco: Never  Substance Use Topics   Alcohol use: No    Alcohol/week: 0.0 standard drinks of alcohol   Drug use: No     Allergies   Codeine   Review of Systems Review of Systems  Gastrointestinal:  Positive for abdominal pain and constipation.     Physical Exam Triage Vital Signs ED Triage Vitals  Enc  Vitals Group     BP 08/02/22 1010 109/77     Pulse Rate 08/02/22 1010 95     Resp 08/02/22 1010 18     Temp 08/02/22 1010 98.1 F (36.7 C)     Temp Source 08/02/22 1010 Oral     SpO2 08/02/22 1010 96 %     Weight --      Height --      Head Circumference --      Peak Flow --      Pain Score 08/02/22 1014 0     Pain Loc --      Pain Edu? --      Excl. in GC? --    No data found.  Updated Vital Signs BP 109/77 (BP Location: Left Arm)   Pulse 95   Temp 98.1 F (36.7 C) (Oral)   Resp 18   SpO2 96%   Visual Acuity Right Eye Distance:   Left Eye Distance:   Bilateral Distance:    Right Eye Near:   Left Eye Near:    Bilateral Near:     Physical Exam Vitals and nursing note reviewed.  Constitutional:      General: She is not in acute distress.    Appearance: She is well-developed and normal weight. She is not ill-appearing, toxic-appearing or diaphoretic.     Comments: thin  HENT:     Head: Normocephalic and atraumatic.     Mouth/Throat:     Mouth: Mucous membranes are moist.     Pharynx: Oropharynx is clear. No pharyngeal swelling or oropharyngeal exudate.  Eyes:     General: No scleral icterus.    Extraocular Movements: Extraocular movements intact.     Pupils: Pupils are equal, round, and reactive to light.     Comments: Some conjunctival pallor  Cardiovascular:     Rate and Rhythm: Normal rate and regular rhythm.  Pulmonary:     Effort: Pulmonary effort is normal. No respiratory distress.     Breath sounds: Normal breath sounds. No stridor.  Abdominal:     General: Abdomen is flat.     Tenderness: There is generalized abdominal tenderness (worse in the LLQ; dullness to percussion of LLQ). There is no rebound. Negative signs include Murphy's sign and Rovsing's sign.     Hernia: No hernia is present.  Skin:    General: Skin is warm and dry.     Coloration: Skin is not jaundiced.     Findings: No erythema or rash.  Neurological:     General: No focal deficit  present.     Mental Status: She is alert and oriented to person, place, and time.  Psychiatric:        Mood and Affect: Mood normal.  Behavior: Behavior normal.      UC Treatments / Results  Labs (all labs ordered are listed, but only abnormal results are displayed) Labs Reviewed  CBC WITH DIFFERENTIAL/PLATELET - Abnormal; Notable for the following components:      Result Value   Hemoglobin 11.8 (*)    HCT 35.8 (*)    All other components within normal limits  BASIC METABOLIC PANEL    EKG   Radiology DG Abd 2 Views  Result Date: 08/02/2022 CLINICAL DATA:  Abdominal pain. EXAM: ABDOMEN - 2 VIEW COMPARISON:  None Available. FINDINGS: The bowel gas pattern is normal without gaseous distention. No free intraperitoneal air. No radio-opaque calculi or other significant radiographic abnormality are seen. Increased stool noted consistent with constipation. IMPRESSION: Constipation.  No evidence of obstruction or free air. Electronically Signed   By: Layla Maw M.D.   On: 08/02/2022 11:21    Procedures Procedures (including critical care time)  Medications Ordered in UC Medications - No data to display  Initial Impression / Assessment and Plan / UC Course  I have reviewed the triage vital signs and the nursing notes.  Pertinent labs & imaging results that were available during my care of the patient were reviewed by me and considered in my medical decision making (see chart for details).     LLQ pain -for the past month.  On exam, she sounds to have some generalized discomfort.  She did have a lower flexible sigmoidoscopy recently which was normal.  Given her history of DVT and clots, I question chronic mesenteric ischemia.  I did call and speak with Dr. Haywood Pao medical assistant to see if he may consider further evaluation for this possible differential. Pt has no red flag findings on exam that would warrant emergency room evaluation Constipation -this appears chronic  over the past year.  She has failed with numerous over-the-counter medications and Linzess.  I will give her a short course of lactulose.  I recommend starting with 1 daily to see if this would produce a bowel movement, increase to 2 if not. Anal fissure -recently started topical nitroglycerin.  I did encourage her to stop her daily insertion of a Fleet enema as I suspect some of her bleeding may be secondary to anal trauma from the enema tip.    Final Clinical Impressions(s) / UC Diagnoses   Final diagnoses:  Left lower quadrant abdominal pain  Constipation, unspecified constipation type  Anal fissure     Discharge Instructions      Please stop using any enemas, as I question if this is worsening the anal fissure and bleeding.  Please switch to lactulose.  Take 15 mL's today.  If no bowel movement in the next 24 hours, you may start taking twice daily.  I will call with the results of your blood work if it requires ER evaluation. I will call and discuss my findings with your gastroenterologist to consider an additional test.  If any acutely worsening symptoms, head to the Emergency Room for further evaluation.    ED Prescriptions     Medication Sig Dispense Auth. Provider   lactulose (CHRONULAC) 10 GM/15ML solution Take 15 mLs (10 g total) by mouth 2 (two) times daily as needed for moderate constipation or severe constipation. 236 mL Elissia Spiewak L, PA      PDMP not reviewed this encounter.   Maretta Bees, [place] 08/02/22 1458

## 2022-08-02 NOTE — Discharge Instructions (Addendum)
Please stop using any enemas, as I question if this is worsening the anal fissure and bleeding.  Please switch to lactulose.  Take 15 mL's today.  If no bowel movement in the next 24 hours, you may start taking twice daily.  I will call with the results of your blood work if it requires ER evaluation. I will call and discuss my findings with your gastroenterologist to consider an additional test.  If any acutely worsening symptoms, head to the Emergency Room for further evaluation.

## 2022-08-02 NOTE — Telephone Encounter (Signed)
Late Entry: Provider Eleonor Ocon called pt around 3:00pm this afternoon to notify of lab results. Pt did not answer. Voicemail was left for patient to return our call to discuss. No call returned to date. Hgb improved from prior. K has been between near the results today on her last two readings -  I would recommend 10 meq K replacement with recheck in one week by PCP/ GI. I also called and spoke with Dr. Elnoria Howard medical assistant who will review labs.

## 2022-08-03 ENCOUNTER — Ambulatory Visit (HOSPITAL_COMMUNITY): Payer: Self-pay

## 2022-08-03 ENCOUNTER — Telehealth: Payer: Medicare HMO | Admitting: Physician Assistant

## 2022-08-03 DIAGNOSIS — R1084 Generalized abdominal pain: Secondary | ICD-10-CM

## 2022-08-03 DIAGNOSIS — K5900 Constipation, unspecified: Secondary | ICD-10-CM

## 2022-08-03 NOTE — Patient Instructions (Signed)
  Sarah Moore, thank you for joining [name], PA-C for today's virtual visit.  While this provider is not your primary care provider (PCP), if your PCP is located in our provider database this encounter information will be shared with them immediately following your visit.   A [facility] MyChart account gives you access to today's visit and all your visits, tests, and labs performed at [facility] " click here if you don't have a [facility] MyChart account or go to [email or web address]  Consent: (Patient) Sarah Moore provided verbal consent for this virtual visit at the beginning of the encounter.  Current Medications:  Current Outpatient Medications:    cycloSPORINE (RESTASIS) 0.05 % ophthalmic emulsion, Place 1 drop into both eyes 2 (two) times daily as needed (for dry eyes)., Disp: , Rfl:    erythromycin ophthalmic ointment, at bedtime., Disp: , Rfl:    gabapentin (NEURONTIN) 300 MG capsule, as needed., Disp: , Rfl:    Glycerin-Polysorbate 80 (REFRESH DRY EYE THERAPY OP), Apply 1 drop to eye daily as needed., Disp: , Rfl:    hydrOXYzine (ATARAX) 25 MG tablet, as needed., Disp: , Rfl:    lactulose (CHRONULAC) 10 GM/15ML solution, Take 15 mLs (10 g total) by mouth 2 (two) times daily as needed for moderate constipation or severe constipation., Disp: 236 mL, Rfl: 0   levocetirizine (XYZAL) 5 MG tablet, as needed., Disp: , Rfl:    Multiple Vitamin (MULTIVITAMIN WITH MINERALS) TABS tablet, Take 1 tablet by mouth daily., Disp: , Rfl:    valACYclovir (VALTREX) 500 MG tablet, as needed., Disp: , Rfl:    Medications ordered in this encounter:  No orders of the defined types were placed in this encounter.    *If you need refills on other medications prior to your next appointment, please contact your pharmacy*  Follow-Up: Call back or seek an in-person evaluation if the symptoms worsen or if the condition fails to improve as anticipated.  [facility] [facility] [phone]  Other Instructions Please be seen at nearest ER ASAP as discussed.   If you have been instructed to have an in-person evaluation today at a local Urgent Care facility, please use the link below. It will take you to a list of all of our available [facility], including address, phone number and hours of operation. Please do not delay care.  [facility]  If you or a family member do not have a primary care provider, use the link below to schedule a visit and establish care. When you choose a [facility] primary care physician or advanced practice provider, you gain a long-term partner in health. Find a Primary Care Provider  Learn more about [facility]'s in-office and virtual care options:  - Get Care Now

## 2022-08-03 NOTE — Progress Notes (Signed)
Virtual Visit Consent   Sarah Moore, you are scheduled for a virtual visit with a [facility] provider today. Just as with appointments in the office, your consent must be obtained to participate. Your consent will be active for this visit and any virtual visit you may have with one of our providers in the next 365 days. If you have a MyChart account, a copy of this consent can be sent to you electronically.  As this is a virtual visit, video technology does not allow for your provider to perform a traditional examination. This may limit your provider's ability to fully assess your condition. If your provider identifies any concerns that need to be evaluated in person or the need to arrange testing (such as labs, EKG, etc.), we will make arrangements to do so. Although advances in technology are sophisticated, we cannot ensure that it will always work on either your end or our end. If the connection with a video visit is poor, the visit may have to be switched to a telephone visit. With either a video or telephone visit, we are not always able to ensure that we have a secure connection.  By engaging in this virtual visit, you consent to the provision of healthcare and authorize for your insurance to be billed (if applicable) for the services provided during this visit. Depending on your insurance coverage, you may receive a charge related to this service.  I need to obtain your verbal consent now. Are you willing to proceed with your visit today? Sarah Moore has provided verbal consent on 08/03/2022 for a virtual visit (video or telephone). Sarah Moore, [place]  Date: 08/03/2022 1:29 PM  Virtual Visit via Video Note   I, Sarah Moore, connected with  FELICITY PENIX  ([id number], 02-15-1951) on 08/03/22 at  1:15 PM EDT by a video-enabled telemedicine application and verified that I am speaking with the correct person using two identifiers.  Location: Patient: Virtual Visit Location  Patient: Home Provider: Virtual Visit Location Provider: Home Office   I discussed the limitations of evaluation and management by telemedicine and the availability of in person appointments. The patient expressed understanding and agreed to proceed.    History of Present Illness: Sarah Moore is a 72 y.o. who identifies as a female who was assigned female at birth, and is being seen today for worsening of abdominal pain with continued inability to have a bowel movement despite use of lactulose prescribed at yesterday's in-person UC visit. Workup at that time including plain films revealing constipation without any sign of obstruction, CBC with borderline anemia(unchanged overall from prior checks) and mild hypokalemia that seems to be chronic. Was started on klor con 10 mEq (has not gotten yet). Denies fever or vomiting. Pain is substantial. Still noting BRBPR when wiping (diagnosed with fissure)  HPI: HPI  Problems:  Patient Active Problem List   Diagnosis Date Noted   Syncope 07/12/2021   Palpitations 10/10/2020   Former tobacco use 10/10/2020   Hyperlipidemia 05/23/2020   CONSTIPATION, CHRONIC 02/18/2007   OSTEOARTHRITIS 02/18/2007   DVT, HX OF 02/18/2007   ARTHROSCOPY, KNEE, HX OF 02/18/2007    Allergies:  Allergies  Allergen Reactions   Codeine Palpitations   Medications:  Current Outpatient Medications:    cycloSPORINE (RESTASIS) 0.05 % ophthalmic emulsion, Place 1 drop into both eyes 2 (two) times daily as needed (for dry eyes)., Disp: , Rfl:    erythromycin ophthalmic ointment, at bedtime., Disp: , Rfl:  gabapentin (NEURONTIN) 300 MG capsule, as needed., Disp: , Rfl:    Glycerin-Polysorbate 80 (REFRESH DRY EYE THERAPY OP), Apply 1 drop to eye daily as needed., Disp: , Rfl:    hydrOXYzine (ATARAX) 25 MG tablet, as needed., Disp: , Rfl:    lactulose (CHRONULAC) 10 GM/15ML solution, Take 15 mLs (10 g total) by mouth 2 (two) times daily as needed for moderate constipation or  severe constipation., Disp: 236 mL, Rfl: 0   levocetirizine (XYZAL) 5 MG tablet, as needed., Disp: , Rfl:    Multiple Vitamin (MULTIVITAMIN WITH MINERALS) TABS tablet, Take 1 tablet by mouth daily., Disp: , Rfl:    valACYclovir (VALTREX) 500 MG tablet, as needed., Disp: , Rfl:   Observations/Objective: Patient is well-developed, well-nourished in no acute distress.  Resting on bed at home.  Head is normocephalic, atraumatic.  No labored breathing.  Speech is clear and coherent with logical content.  Patient is alert and oriented at baseline.   Assessment and Plan: 1. Constipation, unspecified constipation type  2. Generalized abdominal pain  Deterioration in symptoms despite treatment at [facility]. Unable to get in with her GI provider. Giving inability to pass stool and severity if abdominal pain, she is to be evaluated at nearest ER. Niece is going to take her now.   Follow Up Instructions: I discussed the assessment and treatment plan with the patient. The patient was provided an opportunity to ask questions and all were answered. The patient agreed with the plan and demonstrated an understanding of the instructions.  A copy of instructions were sent to the patient via MyChart unless otherwise noted below.   The patient was advised to call back or seek an in-person evaluation if the symptoms worsen or if the condition fails to improve as anticipated.  Time:  I spent 10 minutes with the patient via telehealth technology discussing the above problems/concerns.    Sarah Climes, PA-C

## 2022-08-06 ENCOUNTER — Encounter (HOSPITAL_COMMUNITY): Payer: Self-pay

## 2022-08-06 ENCOUNTER — Other Ambulatory Visit: Payer: Self-pay

## 2022-08-06 ENCOUNTER — Emergency Department (HOSPITAL_COMMUNITY): Payer: Medicare HMO

## 2022-08-06 ENCOUNTER — Inpatient Hospital Stay (HOSPITAL_COMMUNITY)
Admission: EM | Admit: 2022-08-06 | Discharge: 2022-08-28 | DRG: 853 | Disposition: A | Discharge status: Skilled Nursing Facility | Payer: Medicare HMO | Attending: Family Medicine | Admitting: Family Medicine

## 2022-08-06 DIAGNOSIS — E162 Hypoglycemia, unspecified: Secondary | ICD-10-CM | POA: Diagnosis not present

## 2022-08-06 DIAGNOSIS — J90 Pleural effusion, not elsewhere classified: Secondary | ICD-10-CM | POA: Diagnosis not present

## 2022-08-06 DIAGNOSIS — R1310 Dysphagia, unspecified: Secondary | ICD-10-CM | POA: Diagnosis not present

## 2022-08-06 DIAGNOSIS — K7460 Unspecified cirrhosis of liver: Secondary | ICD-10-CM | POA: Diagnosis present

## 2022-08-06 DIAGNOSIS — R578 Other shock: Secondary | ICD-10-CM | POA: Diagnosis present

## 2022-08-06 DIAGNOSIS — K5900 Constipation, unspecified: Secondary | ICD-10-CM | POA: Diagnosis not present

## 2022-08-06 DIAGNOSIS — I6523 Occlusion and stenosis of bilateral carotid arteries: Secondary | ICD-10-CM | POA: Diagnosis not present

## 2022-08-06 DIAGNOSIS — K7682 Hepatic encephalopathy: Secondary | ICD-10-CM | POA: Diagnosis not present

## 2022-08-06 DIAGNOSIS — K921 Melena: Secondary | ICD-10-CM | POA: Diagnosis not present

## 2022-08-06 DIAGNOSIS — K56609 Unspecified intestinal obstruction, unspecified as to partial versus complete obstruction: Secondary | ICD-10-CM | POA: Diagnosis present

## 2022-08-06 DIAGNOSIS — E785 Hyperlipidemia, unspecified: Secondary | ICD-10-CM

## 2022-08-06 DIAGNOSIS — D62 Acute posthemorrhagic anemia: Secondary | ICD-10-CM | POA: Diagnosis not present

## 2022-08-06 DIAGNOSIS — G9341 Metabolic encephalopathy: Secondary | ICD-10-CM

## 2022-08-06 DIAGNOSIS — I714 Abdominal aortic aneurysm, without rupture, unspecified: Secondary | ICD-10-CM | POA: Diagnosis present

## 2022-08-06 DIAGNOSIS — K7200 Acute and subacute hepatic failure without coma: Secondary | ICD-10-CM | POA: Diagnosis not present

## 2022-08-06 DIAGNOSIS — Z79899 Other long term (current) drug therapy: Secondary | ICD-10-CM

## 2022-08-06 DIAGNOSIS — Z6821 Body mass index (BMI) 21.0-21.9, adult: Secondary | ICD-10-CM

## 2022-08-06 DIAGNOSIS — E43 Unspecified severe protein-calorie malnutrition: Secondary | ICD-10-CM | POA: Diagnosis present

## 2022-08-06 DIAGNOSIS — D1803 Hemangioma of intra-abdominal structures: Secondary | ICD-10-CM | POA: Diagnosis not present

## 2022-08-06 DIAGNOSIS — K5669 Other partial intestinal obstruction: Secondary | ICD-10-CM | POA: Diagnosis not present

## 2022-08-06 DIAGNOSIS — K5901 Slow transit constipation: Secondary | ICD-10-CM | POA: Diagnosis present

## 2022-08-06 DIAGNOSIS — R188 Other ascites: Secondary | ICD-10-CM | POA: Diagnosis not present

## 2022-08-06 DIAGNOSIS — R54 Age-related physical debility: Secondary | ICD-10-CM | POA: Diagnosis present

## 2022-08-06 DIAGNOSIS — M6281 Muscle weakness (generalized): Secondary | ICD-10-CM | POA: Diagnosis not present

## 2022-08-06 DIAGNOSIS — G928 Other toxic encephalopathy: Secondary | ICD-10-CM | POA: Diagnosis not present

## 2022-08-06 DIAGNOSIS — Z825 Family history of asthma and other chronic lower respiratory diseases: Secondary | ICD-10-CM

## 2022-08-06 DIAGNOSIS — Z800 Family history of malignant neoplasm of digestive organs: Secondary | ICD-10-CM

## 2022-08-06 DIAGNOSIS — K602 Anal fissure, unspecified: Secondary | ICD-10-CM | POA: Diagnosis present

## 2022-08-06 DIAGNOSIS — Z87891 Personal history of nicotine dependence: Secondary | ICD-10-CM

## 2022-08-06 DIAGNOSIS — K828 Other specified diseases of gallbladder: Secondary | ICD-10-CM | POA: Diagnosis not present

## 2022-08-06 DIAGNOSIS — D649 Anemia, unspecified: Secondary | ICD-10-CM | POA: Diagnosis not present

## 2022-08-06 DIAGNOSIS — R414 Neurologic neglect syndrome: Secondary | ICD-10-CM | POA: Diagnosis not present

## 2022-08-06 DIAGNOSIS — R5381 Other malaise: Secondary | ICD-10-CM | POA: Diagnosis not present

## 2022-08-06 DIAGNOSIS — K561 Intussusception: Principal | ICD-10-CM | POA: Diagnosis present

## 2022-08-06 DIAGNOSIS — K3189 Other diseases of stomach and duodenum: Secondary | ICD-10-CM | POA: Diagnosis not present

## 2022-08-06 DIAGNOSIS — K824 Cholesterolosis of gallbladder: Secondary | ICD-10-CM | POA: Diagnosis not present

## 2022-08-06 DIAGNOSIS — A419 Sepsis, unspecified organism: Secondary | ICD-10-CM | POA: Diagnosis not present

## 2022-08-06 DIAGNOSIS — K648 Other hemorrhoids: Secondary | ICD-10-CM | POA: Diagnosis present

## 2022-08-06 DIAGNOSIS — J9601 Acute respiratory failure with hypoxia: Secondary | ICD-10-CM | POA: Diagnosis not present

## 2022-08-06 DIAGNOSIS — R1312 Dysphagia, oropharyngeal phase: Secondary | ICD-10-CM | POA: Diagnosis not present

## 2022-08-06 DIAGNOSIS — E876 Hypokalemia: Secondary | ICD-10-CM | POA: Diagnosis not present

## 2022-08-06 DIAGNOSIS — R410 Disorientation, unspecified: Secondary | ICD-10-CM | POA: Diagnosis not present

## 2022-08-06 DIAGNOSIS — Z66 Do not resuscitate: Secondary | ICD-10-CM | POA: Diagnosis not present

## 2022-08-06 DIAGNOSIS — R0602 Shortness of breath: Secondary | ICD-10-CM | POA: Diagnosis not present

## 2022-08-06 DIAGNOSIS — K760 Fatty (change of) liver, not elsewhere classified: Secondary | ICD-10-CM | POA: Diagnosis present

## 2022-08-06 DIAGNOSIS — K219 Gastro-esophageal reflux disease without esophagitis: Secondary | ICD-10-CM | POA: Diagnosis present

## 2022-08-06 DIAGNOSIS — K633 Ulcer of intestine: Secondary | ICD-10-CM | POA: Diagnosis not present

## 2022-08-06 DIAGNOSIS — M1990 Unspecified osteoarthritis, unspecified site: Secondary | ICD-10-CM | POA: Diagnosis present

## 2022-08-06 DIAGNOSIS — Z96652 Presence of left artificial knee joint: Secondary | ICD-10-CM | POA: Diagnosis present

## 2022-08-06 DIAGNOSIS — R601 Generalized edema: Secondary | ICD-10-CM | POA: Diagnosis not present

## 2022-08-06 DIAGNOSIS — I2489 Other forms of acute ischemic heart disease: Secondary | ICD-10-CM | POA: Diagnosis not present

## 2022-08-06 DIAGNOSIS — R579 Shock, unspecified: Secondary | ICD-10-CM | POA: Diagnosis not present

## 2022-08-06 DIAGNOSIS — R9431 Abnormal electrocardiogram [ECG] [EKG]: Secondary | ICD-10-CM | POA: Diagnosis not present

## 2022-08-06 DIAGNOSIS — R29810 Facial weakness: Secondary | ICD-10-CM | POA: Diagnosis not present

## 2022-08-06 DIAGNOSIS — R531 Weakness: Secondary | ICD-10-CM | POA: Diagnosis not present

## 2022-08-06 DIAGNOSIS — Z86718 Personal history of other venous thrombosis and embolism: Secondary | ICD-10-CM | POA: Diagnosis not present

## 2022-08-06 DIAGNOSIS — D179 Benign lipomatous neoplasm, unspecified: Secondary | ICD-10-CM | POA: Diagnosis present

## 2022-08-06 DIAGNOSIS — R2681 Unsteadiness on feet: Secondary | ICD-10-CM | POA: Diagnosis not present

## 2022-08-06 DIAGNOSIS — I69354 Hemiplegia and hemiparesis following cerebral infarction affecting left non-dominant side: Secondary | ICD-10-CM | POA: Diagnosis not present

## 2022-08-06 DIAGNOSIS — I639 Cerebral infarction, unspecified: Secondary | ICD-10-CM | POA: Diagnosis not present

## 2022-08-06 DIAGNOSIS — E872 Acidosis, unspecified: Secondary | ICD-10-CM | POA: Diagnosis present

## 2022-08-06 DIAGNOSIS — R319 Hematuria, unspecified: Secondary | ICD-10-CM | POA: Diagnosis not present

## 2022-08-06 DIAGNOSIS — R569 Unspecified convulsions: Secondary | ICD-10-CM | POA: Diagnosis not present

## 2022-08-06 DIAGNOSIS — K660 Peritoneal adhesions (postprocedural) (postinfection): Secondary | ICD-10-CM | POA: Diagnosis present

## 2022-08-06 DIAGNOSIS — Z433 Encounter for attention to colostomy: Secondary | ICD-10-CM | POA: Diagnosis not present

## 2022-08-06 DIAGNOSIS — R739 Hyperglycemia, unspecified: Secondary | ICD-10-CM | POA: Diagnosis not present

## 2022-08-06 DIAGNOSIS — K9131 Postprocedural partial intestinal obstruction: Secondary | ICD-10-CM | POA: Diagnosis not present

## 2022-08-06 DIAGNOSIS — Z832 Family history of diseases of the blood and blood-forming organs and certain disorders involving the immune mechanism: Secondary | ICD-10-CM

## 2022-08-06 DIAGNOSIS — Z885 Allergy status to narcotic agent status: Secondary | ICD-10-CM

## 2022-08-06 DIAGNOSIS — R471 Dysarthria and anarthria: Secondary | ICD-10-CM | POA: Diagnosis not present

## 2022-08-06 DIAGNOSIS — J690 Pneumonitis due to inhalation of food and vomit: Secondary | ICD-10-CM | POA: Diagnosis present

## 2022-08-06 DIAGNOSIS — E8770 Fluid overload, unspecified: Secondary | ICD-10-CM | POA: Diagnosis not present

## 2022-08-06 DIAGNOSIS — K831 Obstruction of bile duct: Secondary | ICD-10-CM | POA: Diagnosis not present

## 2022-08-06 DIAGNOSIS — K5909 Other constipation: Secondary | ICD-10-CM | POA: Diagnosis not present

## 2022-08-06 DIAGNOSIS — D509 Iron deficiency anemia, unspecified: Secondary | ICD-10-CM | POA: Diagnosis not present

## 2022-08-06 DIAGNOSIS — E8809 Other disorders of plasma-protein metabolism, not elsewhere classified: Secondary | ICD-10-CM | POA: Diagnosis not present

## 2022-08-06 DIAGNOSIS — Z7401 Bed confinement status: Secondary | ICD-10-CM | POA: Diagnosis not present

## 2022-08-06 DIAGNOSIS — R29818 Other symptoms and signs involving the nervous system: Secondary | ICD-10-CM | POA: Diagnosis not present

## 2022-08-06 DIAGNOSIS — Z452 Encounter for adjustment and management of vascular access device: Secondary | ICD-10-CM | POA: Diagnosis not present

## 2022-08-06 DIAGNOSIS — N170 Acute kidney failure with tubular necrosis: Secondary | ICD-10-CM | POA: Diagnosis present

## 2022-08-06 DIAGNOSIS — R64 Cachexia: Secondary | ICD-10-CM | POA: Diagnosis present

## 2022-08-06 DIAGNOSIS — E869 Volume depletion, unspecified: Secondary | ICD-10-CM | POA: Diagnosis not present

## 2022-08-06 DIAGNOSIS — K7201 Acute and subacute hepatic failure with coma: Secondary | ICD-10-CM | POA: Diagnosis not present

## 2022-08-06 DIAGNOSIS — I700 Atherosclerosis of aorta: Secondary | ICD-10-CM | POA: Diagnosis not present

## 2022-08-06 DIAGNOSIS — R935 Abnormal findings on diagnostic imaging of other abdominal regions, including retroperitoneum: Secondary | ICD-10-CM | POA: Diagnosis not present

## 2022-08-06 DIAGNOSIS — R6521 Severe sepsis with septic shock: Secondary | ICD-10-CM | POA: Diagnosis not present

## 2022-08-06 DIAGNOSIS — Z515 Encounter for palliative care: Secondary | ICD-10-CM | POA: Diagnosis not present

## 2022-08-06 DIAGNOSIS — R4182 Altered mental status, unspecified: Secondary | ICD-10-CM | POA: Diagnosis not present

## 2022-08-06 DIAGNOSIS — R109 Unspecified abdominal pain: Secondary | ICD-10-CM | POA: Diagnosis not present

## 2022-08-06 DIAGNOSIS — J9690 Respiratory failure, unspecified, unspecified whether with hypoxia or hypercapnia: Secondary | ICD-10-CM | POA: Diagnosis not present

## 2022-08-06 DIAGNOSIS — R2689 Other abnormalities of gait and mobility: Secondary | ICD-10-CM | POA: Diagnosis not present

## 2022-08-06 DIAGNOSIS — R41841 Cognitive communication deficit: Secondary | ICD-10-CM | POA: Diagnosis not present

## 2022-08-06 DIAGNOSIS — R570 Cardiogenic shock: Secondary | ICD-10-CM | POA: Diagnosis not present

## 2022-08-06 DIAGNOSIS — Z7189 Other specified counseling: Secondary | ICD-10-CM | POA: Diagnosis not present

## 2022-08-06 DIAGNOSIS — K6389 Other specified diseases of intestine: Secondary | ICD-10-CM | POA: Diagnosis not present

## 2022-08-06 LAB — CBC WITH DIFFERENTIAL/PLATELET
Abs Immature Granulocytes: 0.02 10*3/uL (ref 0.00–0.07)
Basophils Absolute: 0 10*3/uL (ref 0.0–0.1)
Basophils Relative: 0 %
Eosinophils Absolute: 0.1 10*3/uL (ref 0.0–0.5)
Eosinophils Relative: 1 %
HCT: 33.8 % — ABNORMAL LOW (ref 36.0–46.0)
Hemoglobin: 10.9 g/dL — ABNORMAL LOW (ref 12.0–15.0)
Immature Granulocytes: 0 %
Lymphocytes Relative: 36 %
Lymphs Abs: 2.6 10*3/uL (ref 0.7–4.0)
MCH: 29.2 pg (ref 26.0–34.0)
MCHC: 32.2 g/dL (ref 30.0–36.0)
MCV: 90.6 fL (ref 80.0–100.0)
Monocytes Absolute: 0.4 10*3/uL (ref 0.1–1.0)
Monocytes Relative: 6 %
Neutro Abs: 4 10*3/uL (ref 1.7–7.7)
Neutrophils Relative %: 57 %
Platelets: 303 10*3/uL (ref 150–400)
RBC: 3.73 MIL/uL — ABNORMAL LOW (ref 3.87–5.11)
RDW: 14.1 % (ref 11.5–15.5)
WBC: 7.1 10*3/uL (ref 4.0–10.5)
nRBC: 0 % (ref 0.0–0.2)

## 2022-08-06 LAB — URINALYSIS, ROUTINE W REFLEX MICROSCOPIC
Bacteria, UA: NONE SEEN
Bilirubin Urine: NEGATIVE
Glucose, UA: NEGATIVE mg/dL
Ketones, ur: 5 mg/dL — AB
Leukocytes,Ua: NEGATIVE
Nitrite: NEGATIVE
Protein, ur: NEGATIVE mg/dL
Specific Gravity, Urine: 1.012 (ref 1.005–1.030)
pH: 7 (ref 5.0–8.0)

## 2022-08-06 LAB — COMPREHENSIVE METABOLIC PANEL
ALT: 10 U/L (ref 0–44)
AST: 14 U/L — ABNORMAL LOW (ref 15–41)
Albumin: 3.3 g/dL — ABNORMAL LOW (ref 3.5–5.0)
Alkaline Phosphatase: 55 U/L (ref 38–126)
Anion gap: 10 (ref 5–15)
BUN: 8 mg/dL (ref 8–23)
CO2: 28 mmol/L (ref 22–32)
Calcium: 9.1 mg/dL (ref 8.9–10.3)
Chloride: 100 mmol/L (ref 98–111)
Creatinine, Ser: 0.36 mg/dL — ABNORMAL LOW (ref 0.44–1.00)
GFR, Estimated: >60 mL/min (ref >60)
Glucose, Bld: 96 mg/dL (ref 70–99)
Potassium: 2.5 mmol/L — CL (ref 3.5–5.1)
Sodium: 138 mmol/L (ref 135–145)
Total Bilirubin: 1.3 mg/dL — ABNORMAL HIGH (ref 0.3–1.2)
Total Protein: 7.2 g/dL (ref 6.5–8.1)

## 2022-08-06 LAB — LIPASE, BLOOD: Lipase: 24 U/L (ref 11–51)

## 2022-08-06 MED ORDER — FENTANYL CITRATE PF 50 MCG/ML IJ SOSY: 12.5000 ug | PREFILLED_SYRINGE | INTRAMUSCULAR | Status: DC | PRN

## 2022-08-06 MED ORDER — SODIUM CHLORIDE 0.9 % IV SOLN: INTRAVENOUS | Status: AC

## 2022-08-06 MED ORDER — OXYCODONE-ACETAMINOPHEN 5-325 MG PO TABS
2.0000 | ORAL_TABLET | Freq: Once | ORAL | Status: AC
  Administered 2022-08-06: 2 via ORAL
  Filled 2022-08-06: qty 2

## 2022-08-06 MED ORDER — POTASSIUM CHLORIDE 20 MEQ PO PACK
80.0000 meq | PACK | Freq: Once | ORAL | Status: AC
  Administered 2022-08-06: 80 meq via ORAL
  Filled 2022-08-06: qty 4

## 2022-08-06 MED ORDER — FENTANYL CITRATE PF 50 MCG/ML IJ SOSY
12.5000 ug | PREFILLED_SYRINGE | Freq: Four times a day (QID) | INTRAMUSCULAR | Status: DC | PRN
  Filled 2022-08-06: qty 1

## 2022-08-06 MED ORDER — MAGNESIUM OXIDE -MG SUPPLEMENT 400 (240 MG) MG PO TABS
400.0000 mg | ORAL_TABLET | Freq: Once | ORAL | Status: AC
  Administered 2022-08-06: 400 mg via ORAL
  Filled 2022-08-06: qty 1

## 2022-08-06 MED ORDER — ONDANSETRON HCL 4 MG/2ML IJ SOLN
4.0000 mg | Freq: Once | INTRAMUSCULAR | Status: AC
  Administered 2022-08-06: 4 mg via INTRAVENOUS
  Filled 2022-08-06: qty 2

## 2022-08-06 MED ORDER — IOHEXOL 300 MG/ML  SOLN
80.0000 mL | Freq: Once | INTRAMUSCULAR | Status: AC | PRN
  Administered 2022-08-06: 80 mL via INTRAVENOUS

## 2022-08-06 MED ORDER — POTASSIUM CHLORIDE 10 MEQ/100ML IV SOLN
10.0000 meq | INTRAVENOUS | Status: AC
  Administered 2022-08-06 (×2): 10 meq via INTRAVENOUS
  Filled 2022-08-06 (×2): qty 100

## 2022-08-06 MED ORDER — SODIUM CHLORIDE 0.9 % IV BOLUS
1000.0000 mL | Freq: Once | INTRAVENOUS | Status: AC
  Administered 2022-08-06: 1000 mL via INTRAVENOUS

## 2022-08-06 NOTE — ED Provider Notes (Signed)
[place] [facility]ERGENC[facility] DEPARTMENT AT [facility] Provider Note   CSN: [id number] Arrival date & time: 08/06/22  1010     History  Chief Complaint  Patient presents with   Constipation    Sarah Moore is a 72 y.o. female with past medical history hyperlipidemia, GERD, osteoarthritis who presents to the ED for evaluation of constipation.  Patient reports chronic history of constipation and is followed by Dr. Elnoria Howard with GI.  Scheduled for outpatient CT scan later this week.  Recently evaluated at urgent care for the same.  Patient had flex sig by GI earlier this month which showed internal hemorrhoid and anal fissure but was otherwise normal.  She was started on Linzess and topical nitroglycerin for symptomatic treatment.  Evaluated in urgent care on 4/18 and started on short course of lactulose.  Reports minimal improvement with this.  Last bowel movement patient estimates was 1 week ago and was minimal.  Associated lower abdominal pain and discomfort.  She denies associated fever, chills, nausea, vomiting, diarrhea, chest pain, or shortness of breath.  States that she has had decreased p.o. intake secondary to abdominal discomfort.  No history of malignancy.  No history of bowel obstruction.  Patient denies previous abdominal surgeries.      Home Medications Prior to Admission medications   Medication Sig Start Date End Date Taking? Authorizing Provider  cycloSPORINE (RESTASIS) 0.05 % ophthalmic emulsion Place 1 drop into both eyes 2 (two) times daily as needed (for dry eyes).    [provider]  erythromycin ophthalmic ointment at bedtime. 03/27/21   [provider]  gabapentin (NEURONTIN) 300 MG capsule as needed.    [provider]  Glycerin-Polysorbate 80 (REFRESH DRY EYE THERAPY OP) Apply 1 drop to eye daily as needed.    [provider]  hydrOXYzine (ATARAX) 25 MG tablet as needed.    [provider]  lactulose (CHRONULAC) 10  GM/15ML solution Take 15 mLs (10 g total) by mouth 2 (two) times daily as needed for moderate constipation or severe constipation. 08/02/22   Crain, Whitney L, PA  levocetirizine (XYZAL) 5 MG tablet as needed. 07/04/20   [provider]  Multiple Vitamin (MULTIVITAMIN WITH MINERALS) TABS tablet Take 1 tablet by mouth daily.    [provider]  valACYclovir (VALTREX) 500 MG tablet as needed. 11/23/20   [provider]      Allergies    Codeine    Review of Systems   Review of Systems  All other systems reviewed and are negative.   Physical Exam Updated Vital Signs BP (!) 140/82   Pulse 76   Temp 98.1 F (36.7 C) (Oral)   Resp 14   Ht 5' (1.524 m)   Wt 52.2 kg   SpO2 99%   BMI 22.46 kg/m  Physical Exam Vitals and nursing note reviewed.  Constitutional:      General: She is not in acute distress.    Appearance: Normal appearance. She is not ill-appearing, toxic-appearing or diaphoretic.  HENT:     Head: Normocephalic and atraumatic.     Mouth/Throat:     Mouth: Mucous membranes are moist.  Eyes:     General: No scleral icterus.    Conjunctiva/sclera: Conjunctivae normal.  Cardiovascular:     Rate and Rhythm: Normal rate and regular rhythm.     Heart sounds: No murmur heard. Pulmonary:     Effort: Pulmonary effort is normal. No respiratory distress.     Breath sounds:  Normal breath sounds. No stridor. No wheezing, rhonchi or rales.  Abdominal:     General: Abdomen is flat. There is distension (mild).     Palpations: Abdomen is soft. There is no mass.     Tenderness: There is abdominal tenderness (moderate BLQ). There is guarding. There is no right CVA tenderness, left CVA tenderness or rebound.  Musculoskeletal:        General: Normal range of motion.     Cervical back: Normal range of motion and neck supple. No rigidity.     Right lower leg: No edema.     Left lower leg: No edema.  Skin:    General: Skin is warm and dry.     Capillary  Refill: Capillary refill takes less than 2 seconds.     Coloration: Skin is not jaundiced or pale.     Findings: No rash.  Neurological:     Mental Status: She is alert. Mental status is at baseline.  Psychiatric:        Behavior: Behavior normal.     ED Results / Procedures / Treatments   Labs (all labs ordered are listed, but only abnormal results are displayed) Labs Reviewed  CBC WITH DIFFERENTIAL/PLATELET - Abnormal; Notable for the following components:      Result Value   RBC 3.73 (*)    Hemoglobin 10.9 (*)    HCT 33.8 (*)    All other components within normal limits  COMPREHENSIVE METABOLIC PANEL - Abnormal; Notable for the following components:   Potassium 2.5 (*)    Creatinine, Ser 0.36 (*)    Albumin 3.3 (*)    AST 14 (*)    Total Bilirubin 1.3 (*)    All other components within normal limits  LIPASE, BLOOD  URINALYSIS, ROUTINE W REFLEX MICROSCOPIC    EKG EKG Interpretation  Date/Time:  Monday August 06 2022 11:53:22 EDT Ventricular Rate:  71 PR Interval:  231 QRS Duration: 84 QT Interval:  409 QTC Calculation: 445 R Axis:   -3 Text Interpretation: Sinus rhythm Prolonged PR interval Abnormal R-wave progression, early transition Confirmed by Kristine Royal [phone]) on 08/06/2022 1:25:58 PM  Radiology No results found.  Procedures Procedures    Medications Ordered in ED Medications  potassium chloride 10 mEq in 100 mL IVPB (10 mEq Intravenous New Bag/Given 08/06/22 1504)  oxyCODONE-acetaminophen (PERCOCET/ROXICET) 5-325 MG per tablet 2 tablet (2 tablets Oral Given 08/06/22 1227)  sodium chloride 0.9 % bolus 1,000 mL (1,000 mLs Intravenous New Bag/Given 08/06/22 1220)  ondansetron (ZOFRAN) injection 4 mg (4 mg Intravenous Given 08/06/22 1226)  potassium chloride (KLOR-CON) packet 80 mEq (80 mEq Oral Given 08/06/22 1506)  magnesium oxide (MAG-OX) tablet 400 mg (400 mg Oral Given 08/06/22 1434)    ED Course/ Medical Decision Making/ A&P                              Medical Decision Making Amount and/or Complexity of Data Reviewed Labs: ordered. Decision-making details documented in ED Course. Radiology: ordered. Decision-making details documented in ED Course. ECG/medicine tests: ordered. Decision-making details documented in ED Course.  Risk OTC drugs. Prescription drug management.   Medical Decision Making:   Sarah Moore is a 72 y.o. female who presented to the ED today with abdominal pain detailed above.    Patient's presentation is complicated by their history of HLD, chronic constipation.  Complete initial physical exam performed, notably the patient  was in no  acute distress.  Moderate diffuse lower abdominal tenderness with guarding.  Some minimal distention.  No CVA tenderness.  Vital signs unremarkable.  Patient nontoxic-appearing.    Reviewed and confirmed nursing documentation for past medical history, family history, social history.    Initial Assessment:   With the patient's presentation of abdominal pain, differential diagnosis includes but is not limited to AAA, mesenteric ischemia, appendicitis, diverticulitis, DKA, gastritis, gastroenteritis, AMI, nephrolithiasis, pancreatitis, peritonitis, adrenal insufficiency, intestinal ischemia, constipation, UTI, SBO/LBO, splenic rupture, biliary disease, IBD, IBS, PUD, hepatitis, STD, ovarian/testicular torsion, electrolyte disturbance, DKA, dehydration, acute kidney injury, renal failure, cholecystitis, cholelithiasis, choledocholithiasis, abdominal pain of  unknown etiology.   Initial Plan:  Screening labs including CBC and Metabolic panel to evaluate for infectious or metabolic etiology of disease.  Lipase to evaluate for pancreatitis Urinalysis with reflex culture ordered to evaluate for UTI or relevant urologic/nephrologic pathology.  CT abd/pelvis to evaluate for intra-abdominal pathology EKG to evaluate for cardiac pathology Symptomatic management Objective evaluation as  reviewed   Initial Study Results:   Laboratory  All laboratory results reviewed without evidence of clinically relevant pathology.   Exceptions include: K2.5, hemoglobin 10.9  EKG EKG was reviewed independently. ST segments without concerns for elevations.   EKG: normal sinus rhythm.   Radiology:  All images reviewed independently. Agree with radiology report at this time.   DG Abd 2 Views  Result Date: 08/02/2022 CLINICAL DATA:  Abdominal pain. EXAM: ABDOMEN - 2 VIEW COMPARISON:  None Available. FINDINGS: The bowel gas pattern is normal without gaseous distention. No free intraperitoneal air. No radio-opaque calculi or other significant radiographic abnormality are seen. Increased stool noted consistent with constipation. IMPRESSION: Constipation.  No evidence of obstruction or free air. Electronically Signed   By: Layla Maw M.D.   On: 08/02/2022 11:21     Final Assessment and Plan:   72 year old female here for constipation, lower abdominal pain. Hx chronic constipation, seen by GI, UC previously. No improvement with Linzess and lactulose course. LBM 1 week ago. Flex-sig by GI earlier this month for same which was essentially normal, found to have internal hemorrhoid and anal fissure. Pt denies significant bleeding. Lower abdominal distension and tenderness. Workup ordered as above for further assessment to rule out bowel obstruction/acute abdomen. Labs revealed hypokalemia at 2.5, K and Mg ordered for repletion. Pending imaging at shift change and care transitioned to Army Melia, PA-C, pending this and disposition with anticipated discharge and follow up with GI if negative.     Clinical Impression:  1. Slow transit constipation   2. Hypokalemia      Data Unavailable           Final Clinical Impression(s) / ED Diagnoses Final diagnoses:  Slow transit constipation  Hypokalemia    Rx / DC Orders ED Discharge Orders     None         Tonette Lederer,  PA-C 08/06/22 1547    Wynetta Fines, MD 08/06/22 [phone]

## 2022-08-06 NOTE — Consult Note (Signed)
Reason for Consult: Large bowel obstruction/intussusception/colonic mass Referring Physician: Cyndia Bent MD  Sarah Moore is an 72 y.o. female.  HPI: 72 year old female admitted with a 1 month history of lower abdominal pain.  History of chronic constipation with with last bowel movement over 7 days ago.  Developed worsening lower abdominal pain.  Also had hematuria.  CT scan showed colonic obstruction of the descending colon and sigmoid colon with possible intussusception from intraluminal mass.  Pain is in the mid abdomen.  She is not passing flatus.  No nausea vomiting.  She has had a very poor appetite for well over a week.  She had a recent flexible sigmoidoscopy which showed hemorrhoids.  I do not have the report to review though.  History of hemorrhoids.  She has passed some blood from her stool the last few weeks. Past Medical History:  Diagnosis Date   Acid reflux    Arthritis    Chest pain    Fatigue    GERD (gastroesophageal reflux disease)    Hyperlipidemia     Past Surgical History:  Procedure Laterality Date   ABDOMINAL HYSTERECTOMY     COLONOSCOPY     KNEE ARTHROPLASTY Left     Family History  Adopted: Yes  Problem Relation Age of Onset   Asthma Brother    Stomach cancer Brother    Clotting disorder Son        blood clot    Social History:  reports that she has quit smoking. Her smoking use included cigarettes. She smoked an average of .25 packs per day. She has never used smokeless tobacco. She reports that she does not drink alcohol and does not use drugs.  Allergies:  Allergies  Allergen Reactions   Codeine Palpitations    Medications: I have reviewed the patient's current medications.  Results for orders placed or performed during the hospital encounter of 08/06/22 (from the past 48 hour(s))  CBC with Differential     Status: Abnormal   Collection Time: 08/06/22 12:30 PM  Result Value Ref Range   WBC 7.1 4.0 - 10.5 K/uL   RBC 3.73 (L) 3.87 - 5.11 MIL/uL    Hemoglobin 10.9 (L) 12.0 - 15.0 g/dL   HCT 16.1 (L) 09.6 - 04.5 %   MCV 90.6 80.0 - 100.0 fL   MCH 29.2 26.0 - 34.0 pg   MCHC 32.2 30.0 - 36.0 g/dL   RDW 40.9 81.1 - 91.4 %   Platelets 303 150 - 400 K/uL    Comment: SPECIMEN CHECKED FOR CLOTS REPEATED TO VERIFY    nRBC 0.0 0.0 - 0.2 %   Neutrophils Relative % 57 %   Neutro Abs 4.0 1.7 - 7.7 K/uL   Lymphocytes Relative 36 %   Lymphs Abs 2.6 0.7 - 4.0 K/uL   Monocytes Relative 6 %   Monocytes Absolute 0.4 0.1 - 1.0 K/uL   Eosinophils Relative 1 %   Eosinophils Absolute 0.1 0.0 - 0.5 K/uL   Basophils Relative 0 %   Basophils Absolute 0.0 0.0 - 0.1 K/uL   Immature Granulocytes 0 %   Abs Immature Granulocytes 0.02 0.00 - 0.07 K/uL    Comment: Performed at [facility], [address] [place], [place] [address]  Comprehensive metabolic panel     Status: Abnormal   Collection Time: 08/06/22 12:30 PM  Result Value Ref Range   Sodium 138 135 - 145 mmol/L   Potassium 2.5 (LL) 3.5 - 5.1 mmol/L    Comment:  CRITICAL RESULT CALLED TO, READ BACK BY AND VERIFIED WITH ABRU,D. RN AT 1330 08/06/22 MULLINS,T    Chloride 100 98 - 111 mmol/L   CO2 28 22 - 32 mmol/L   Glucose, Bld 96 70 - 99 mg/dL    Comment: Glucose reference range applies only to samples taken after fasting for at least 8 hours.   BUN 8 8 - 23 mg/dL   Creatinine, Ser 1.61 (L) 0.44 - 1.00 mg/dL   Calcium 9.1 8.9 - 09.6 mg/dL   Total Protein 7.2 6.5 - 8.1 g/dL   Albumin 3.3 (L) 3.5 - 5.0 g/dL   AST 14 (L) 15 - 41 U/L   ALT 10 0 - 44 U/L   Alkaline Phosphatase 55 38 - 126 U/L   Total Bilirubin 1.3 (H) 0.3 - 1.2 mg/dL   GFR, Estimated >04 >54 mL/min    Comment: (NOTE) Calculated using the CKD-EPI Creatinine Equation (2021)    Anion gap 10 5 - 15    Comment: Performed at [facility], [address], [place], [place] [address]  Lipase, blood     Status: None   Collection Time: 08/06/22 12:30 PM  Result Value Ref Range   Lipase 24 11  - 51 U/L    Comment: Performed at [facility], [address] [place], [place] [address]  Urinalysis, Routine w reflex microscopic -Urine, Clean Catch     Status: Abnormal   Collection Time: 08/06/22  3:12 PM  Result Value Ref Range   Color, Urine YELLOW YELLOW   APPearance CLEAR CLEAR   Specific Gravity, Urine 1.012 1.005 - 1.030   pH 7.0 5.0 - 8.0   Glucose, UA NEGATIVE NEGATIVE mg/dL   Hgb urine dipstick SMALL (A) NEGATIVE   Bilirubin Urine NEGATIVE NEGATIVE   Ketones, ur 5 (A) NEGATIVE mg/dL   Protein, ur NEGATIVE NEGATIVE mg/dL   Nitrite NEGATIVE NEGATIVE   Leukocytes,Ua NEGATIVE NEGATIVE   RBC / HPF 0-5 0 - 5 RBC/hpf   WBC, UA 0-5 0 - 5 WBC/hpf   Bacteria, UA NONE SEEN NONE SEEN   Squamous Epithelial / HPF 0-5 0 - 5 /HPF   Mucus PRESENT    Budding Yeast PRESENT     Comment: Performed at [facility], [address], [place], [place] [address]    CT ABDOMEN PELVIS W CONTRAST  Result Date: 08/06/2022 CLINICAL DATA:  Hematuria, suspected bowel obstruction EXAM: CT ABDOMEN AND PELVIS WITH CONTRAST TECHNIQUE: Multidetector CT imaging of the abdomen and pelvis was performed using the standard protocol following bolus administration of intravenous contrast. RADIATION DOSE REDUCTION: This exam was performed according to the departmental dose-optimization program which includes automated exposure control, adjustment of the mA and/or kV according to patient size and/or use of iterative reconstruction technique. CONTRAST:  80mL OMNIPAQUE IOHEXOL 300 MG/ML  SOLN COMPARISON:  08/02/2022, 07/02/2012 FINDINGS: Lower chest: No acute pleural or parenchymal lung disease. Hepatobiliary: Involution of the cavernous hemangioma seen within the left lobe liver on prior exam. This now measures up to 2.3 cm in maximal dimension, previously measuring 5.6 cm. No acute liver abnormality. The gallbladder is unremarkable. No biliary duct dilation. Pancreas: Moderate  pancreatic atrophy. No acute inflammatory changes or pancreatic duct dilation. Spleen: Normal in size without focal abnormality. Adrenals/Urinary Tract: The kidneys enhance normally and symmetrically. No urinary tract calculi or obstructive uropathy. Bladder is decompressed, the bladder is minimally distended, without focal abnormality. The adrenals are unremarkable. Stomach/Bowel: There is a colo-colic  intussusception beginning at the junction of the descending and sigmoid colon. Lead point is likely a large intraluminal lipoma, which has increased in size since prior study measuring 6.4 x 4.5 cm on today's exam. There is mild mural thickening of the intussusceptum, without frank evidence of bowel ischemia. Moderate gaseous distension of the colon proximal to the intussusception could reflect partial functional colonic obstruction. Gas is seen within the rectal vault. There is no evidence of small-bowel obstruction. Normal appendix right lower quadrant. Vascular/Lymphatic: There is marked eccentric atheromatous plaque within the aorta at the diaphragmatic hiatus, with approximately 50% stenosis in that region. Atherosclerosis throughout the remainder of the visualized thoracoabdominal aorta without evidence of aneurysm or dissection. No pathologic adenopathy. Reproductive: Status post hysterectomy. No adnexal masses. Other: No free fluid or free intraperitoneal gas. No abdominal wall hernia. Musculoskeletal: There are no acute or destructive bony lesions. Reconstructed images demonstrate no additional findings. IMPRESSION: 1. Colo-colic intussusception at the junction of the descending and sigmoid colon, likely due to a large intraluminal lipoma serving as the lead point. There is mild mural thickening of the intussusceptum, without frank evidence of bowel ischemia. Likely partial functional colonic obstruction with moderate gaseous distension of the proximal colon. Gas is seen within the rectal vault. Surgical  consultation recommended. 2. Aortic Atherosclerosis (ICD10-I70.0). Moderate eccentric plaque located at the diaphragmatic hiatus, with approximately 50% narrowing of the aortic lumen in that region. 3. Significant involution of the cavernous hemangioma within the left lobe liver as above. Critical Value/emergent results were called by telephone at the time of interpretation on 08/06/2022 at 5:06 pm to provider [facility] G[place] , who verbally acknowledged these results. Electronically Signed   By: Sharlet Salina M.D.   On: 08/06/2022 17:08    Review of Systems  Constitutional:  Positive for appetite change, fatigue and unexpected weight change.  Respiratory: Negative.    Gastrointestinal:  Positive for abdominal pain, blood in stool and constipation.  Musculoskeletal: Negative.   Neurological: Negative.   Hematological: Negative.   Psychiatric/Behavioral: Negative.     Blood pressure (!) 148/84, pulse 75, temperature 98.1 F (36.7 C), temperature source Oral, resp. rate 16, height 5' (1.524 m), weight 52.2 kg, SpO2 100 %. Physical Exam Constitutional:      Appearance: Normal appearance.  HENT:     Head: Normocephalic.     Nose: Nose normal.  Eyes:     Pupils: Pupils are equal, round, and reactive to light.  Cardiovascular:     Rate and Rhythm: Normal rate.  Abdominal:     General: There is distension.     Tenderness: There is abdominal tenderness. There is no guarding or rebound.  Musculoskeletal:     Cervical back: Normal range of motion.  Skin:    General: Skin is warm.  Neurological:     Mental Status: She is alert.  Psychiatric:        Mood and Affect: Mood normal.        Behavior: Behavior normal.     Assessment/Plan: Large bowel obstruction with chronic constipation history and questionable intussusception of descending colon sigmoid colon with intra-colonic mass suspected to be lipoma cannot exclude malignancy  Severe hypokalemia  She has no peritonitis at this point time  but will need colonic resection of this during the admission.  Recommend n.p.o. for tonight and resuscitate with IV fluids.  This appears to be a chronic problem and has been going on for some time.  Primary team to see in a.m. and decide  on timing of surgical procedure.  I discussed some of this with her tonight and she voiced understanding.  Sarah Moore 08/06/2022, 9:51 PM    Moderate complexity

## 2022-08-06 NOTE — ED Triage Notes (Signed)
Patient feels constipated. Stated when she tries to have a bowel movement she has a hemorrhoid and blood comes out. Was told she needs CT scan by urgent care. Last bowel movement over 1.5 weeks ago. Loss of appetite.

## 2022-08-06 NOTE — ED Notes (Signed)
ED TO INPATIENT HANDOFF REPORT  Name/Age/Gender Sarah Moore 72 y.o. female  Code Status   Home/SNF/Other Home  Chief Complaint Colonic intussusception [K56.1]  Level of Care/Admitting Diagnosis ED Disposition     ED Disposition  Admit   Condition  --   Comment  Hospital Area: [facility] [[id number]]  Level of Care: Med-Surg [16]  May admit patient to [facility] or [facility] if equivalent level of care is available:: No  Covid Evaluation: Asymptomatic - no recent exposure (last 10 days) testing not required  Diagnosis: Colonic intussusception [255819]  Admitting Physician: Anselm Jungling [[id number]]  Attending Physician: Anselm Jungling [[id number]]  Certification:: I certify this patient will need inpatient services for at least 2 midnights  Estimated Length of Stay: 2          Medical History Past Medical History:  Diagnosis Date   Acid reflux    Arthritis    Chest pain    Fatigue    GERD (gastroesophageal reflux disease)    Hyperlipidemia     Allergies Allergies  Allergen Reactions   Codeine Palpitations    IV Location/Drains/Wounds Patient Lines/Drains/Airways Status     Active Line/Drains/Airways     Name Placement date Placement time Site Days   Peripheral IV 08/06/22 20 G Right Antecubital 08/06/22  1220  Antecubital  less than 1            Labs/Imaging Results for orders placed or performed during the hospital encounter of 08/06/22 (from the past 48 hour(s))  CBC with Differential     Status: Abnormal   Collection Time: 08/06/22 12:30 PM  Result Value Ref Range   WBC 7.1 4.0 - 10.5 K/uL   RBC 3.73 (L) 3.87 - 5.11 MIL/uL   Hemoglobin 10.9 (L) 12.0 - 15.0 g/dL   HCT 19.1 (L) 47.8 - 29.5 %   MCV 90.6 80.0 - 100.0 fL   MCH 29.2 26.0 - 34.0 pg   MCHC 32.2 30.0 - 36.0 g/dL   RDW 62.1 30.8 - 65.7 %   Platelets 303 150 - 400 K/uL    Comment: SPECIMEN CHECKED FOR CLOTS REPEATED TO VERIFY    nRBC 0.0 0.0 - 0.2 %    Neutrophils Relative % 57 %   Neutro Abs 4.0 1.7 - 7.7 K/uL   Lymphocytes Relative 36 %   Lymphs Abs 2.6 0.7 - 4.0 K/uL   Monocytes Relative 6 %   Monocytes Absolute 0.4 0.1 - 1.0 K/uL   Eosinophils Relative 1 %   Eosinophils Absolute 0.1 0.0 - 0.5 K/uL   Basophils Relative 0 %   Basophils Absolute 0.0 0.0 - 0.1 K/uL   Immature Granulocytes 0 %   Abs Immature Granulocytes 0.02 0.00 - 0.07 K/uL    Comment: Performed at [facility], [address], [place], [place] [address]  Comprehensive metabolic panel     Status: Abnormal   Collection Time: 08/06/22 12:30 PM  Result Value Ref Range   Sodium 138 135 - 145 mmol/L   Potassium 2.5 (LL) 3.5 - 5.1 mmol/L    Comment: CRITICAL RESULT CALLED TO, READ BACK BY AND VERIFIED WITH ABRU,D. RN AT 1330 08/06/22 MULLINS,T    Chloride 100 98 - 111 mmol/L   CO2 28 22 - 32 mmol/L   Glucose, Bld 96 70 - 99 mg/dL    Comment: Glucose reference range applies only to samples taken after fasting for at least 8 hours.   BUN 8  8 - 23 mg/dL   Creatinine, Ser 1.61 (L) 0.44 - 1.00 mg/dL   Calcium 9.1 8.9 - 09.6 mg/dL   Total Protein 7.2 6.5 - 8.1 g/dL   Albumin 3.3 (L) 3.5 - 5.0 g/dL   AST 14 (L) 15 - 41 U/L   ALT 10 0 - 44 U/L   Alkaline Phosphatase 55 38 - 126 U/L   Total Bilirubin 1.3 (H) 0.3 - 1.2 mg/dL   GFR, Estimated >04 >54 mL/min    Comment: (NOTE) Calculated using the CKD-EPI Creatinine Equation (2021)    Anion gap 10 5 - 15    Comment: Performed at [facility], [address], [place], [place] [address]  Lipase, blood     Status: None   Collection Time: 08/06/22 12:30 PM  Result Value Ref Range   Lipase 24 11 - 51 U/L    Comment: Performed at [facility], [address], [place], [place] [address]  Urinalysis, Routine w reflex microscopic -Urine, Clean Catch     Status: Abnormal   Collection Time: 08/06/22  3:12 PM  Result Value Ref Range   Color, Urine YELLOW YELLOW    APPearance CLEAR CLEAR   Specific Gravity, Urine 1.012 1.005 - 1.030   pH 7.0 5.0 - 8.0   Glucose, UA NEGATIVE NEGATIVE mg/dL   Hgb urine dipstick SMALL (A) NEGATIVE   Bilirubin Urine NEGATIVE NEGATIVE   Ketones, ur 5 (A) NEGATIVE mg/dL   Protein, ur NEGATIVE NEGATIVE mg/dL   Nitrite NEGATIVE NEGATIVE   Leukocytes,Ua NEGATIVE NEGATIVE   RBC / HPF 0-5 0 - 5 RBC/hpf   WBC, UA 0-5 0 - 5 WBC/hpf   Bacteria, UA NONE SEEN NONE SEEN   Squamous Epithelial / HPF 0-5 0 - 5 /HPF   Mucus PRESENT    Budding Yeast PRESENT     Comment: Performed at [facility], [address], [place], [place] [address]   CT ABDOMEN PELVIS W CONTRAST  Result Date: 08/06/2022 CLINICAL DATA:  Hematuria, suspected bowel obstruction EXAM: CT ABDOMEN AND PELVIS WITH CONTRAST TECHNIQUE: Multidetector CT imaging of the abdomen and pelvis was performed using the standard protocol following bolus administration of intravenous contrast. RADIATION DOSE REDUCTION: This exam was performed according to the departmental dose-optimization program which includes automated exposure control, adjustment of the mA and/or kV according to patient size and/or use of iterative reconstruction technique. CONTRAST:  80mL OMNIPAQUE IOHEXOL 300 MG/ML  SOLN COMPARISON:  08/02/2022, 07/02/2012 FINDINGS: Lower chest: No acute pleural or parenchymal lung disease. Hepatobiliary: Involution of the cavernous hemangioma seen within the left lobe liver on prior exam. This now measures up to 2.3 cm in maximal dimension, previously measuring 5.6 cm. No acute liver abnormality. The gallbladder is unremarkable. No biliary duct dilation. Pancreas: Moderate pancreatic atrophy. No acute inflammatory changes or pancreatic duct dilation. Spleen: Normal in size without focal abnormality. Adrenals/Urinary Tract: The kidneys enhance normally and symmetrically. No urinary tract calculi or obstructive uropathy. Bladder is decompressed, the bladder is minimally  distended, without focal abnormality. The adrenals are unremarkable. Stomach/Bowel: There is a colo-colic intussusception beginning at the junction of the descending and sigmoid colon. Lead point is likely a large intraluminal lipoma, which has increased in size since prior study measuring 6.4 x 4.5 cm on today's exam. There is mild mural thickening of the intussusceptum, without frank evidence of bowel ischemia. Moderate gaseous distension of the colon proximal to the intussusception could reflect partial functional colonic obstruction. Gas  is seen within the rectal vault. There is no evidence of small-bowel obstruction. Normal appendix right lower quadrant. Vascular/Lymphatic: There is marked eccentric atheromatous plaque within the aorta at the diaphragmatic hiatus, with approximately 50% stenosis in that region. Atherosclerosis throughout the remainder of the visualized thoracoabdominal aorta without evidence of aneurysm or dissection. No pathologic adenopathy. Reproductive: Status post hysterectomy. No adnexal masses. Other: No free fluid or free intraperitoneal gas. No abdominal wall hernia. Musculoskeletal: There are no acute or destructive bony lesions. Reconstructed images demonstrate no additional findings. IMPRESSION: 1. Colo-colic intussusception at the junction of the descending and sigmoid colon, likely due to a large intraluminal lipoma serving as the lead point. There is mild mural thickening of the intussusceptum, without frank evidence of bowel ischemia. Likely partial functional colonic obstruction with moderate gaseous distension of the proximal colon. Gas is seen within the rectal vault. Surgical consultation recommended. 2. Aortic Atherosclerosis (ICD10-I70.0). Moderate eccentric plaque located at the diaphragmatic hiatus, with approximately 50% narrowing of the aortic lumen in that region. 3. Significant involution of the cavernous hemangioma within the left lobe liver as above. Critical  Value/emergent results were called by telephone at the time of interpretation on 08/06/2022 at 5:06 pm to provider [facility] , who verbally acknowledged these results. Electronically Signed   By: Sharlet Salina M.D.   On: 08/06/2022 17:08    Pending Labs Unresulted Labs (From admission, onward)    None       Vitals/Pain Today's Vitals   08/06/22 1500 08/06/22 1501 08/06/22 1813 08/06/22 1813  BP: 138/78   138/78  Pulse: 77   70  Resp: 16   16  Temp:  98.1 F (36.7 C) 98.1 F (36.7 C)   TempSrc:  Oral Oral   SpO2: 99%   100%  Weight:      Height:      PainSc:        Isolation Precautions No active isolations  Medications Medications  oxyCODONE-acetaminophen (PERCOCET/ROXICET) 5-325 MG per tablet 2 tablet (2 tablets Oral Given 08/06/22 1227)  sodium chloride 0.9 % bolus 1,000 mL (0 mLs Intravenous Stopped 08/06/22 1635)  ondansetron (ZOFRAN) injection 4 mg (4 mg Intravenous Given 08/06/22 1226)  potassium chloride (KLOR-CON) packet 80 mEq (80 mEq Oral Given 08/06/22 1506)  potassium chloride 10 mEq in 100 mL IVPB (0 mEq Intravenous Stopped 08/06/22 1635)  magnesium oxide (MAG-OX) tablet 400 mg (400 mg Oral Given 08/06/22 1434)  iohexol (OMNIPAQUE) 300 MG/ML solution 80 mL (80 mLs Intravenous Contrast Given 08/06/22 1637)    Mobility walks with person assist

## 2022-08-06 NOTE — Assessment & Plan Note (Signed)
-  administered IV and oral potassium. Also given oral Mg in ED. -follow repeat K in the morning

## 2022-08-06 NOTE — H&P (Signed)
History and Physical    Patient: Sarah Moore NWG:[id number] DOB: [birth year] DOA: 08/06/2022 DOS: the patient was seen and examined on 08/06/2022 PCP: Norm Salt, PA  Patient coming from: Home  Chief Complaint:  Chief Complaint  Patient presents with   Constipation   HPI: Sarah Moore is a 72 y.o. female with medical history significant of remote DVT not on anticoagulation, HLD, chronic constipation who presents with constipation, abdominal pain.   She was evaluated at Urgent care 2 days ago for continual abdominal discomfort and constipation despite Linzess and fleet enema. Follows with Dr. Jeani Hawking with gastroenterology for chronic constipation. Has also been having hematochezia due to anal fissure. Had flex-sigmoidoscopy on 4/9 that was reportedly normal-not able to see records in [place].Last colonoscopy reported in February that had only polyps.  She was advised to start Lactulose which she has been taking.   Still continue to have worsening lower abdomen pain. Last BM was at least a week ago and it was minimal even at that point. No nausea or vomiting. No hx of abdominal surgery.   In the ED, CT abd/pelvis revealed colo-colic intussusception beginning at the junction of the descending and sigmoid colon. Lead point is likely a large intraluminal lipoma, which has increased in size since prior study measuring 6.4 x 4.5 cm on today's exam. There is mild mural thickening of the intussusceptum, without frank evidence of bowel ischemia. Moderate gaseous distension of the colon proximal to the intussusception could reflect partial functional colonic obstruction. Gas is seen within the rectal vault. There is no evidence of small-bowel obstruction.  ED PA discussed with GI Dr. Loreta Ave who recommended General surgery consult. Dr. Latricia Heft was consulted and advised keeping NPO and will consult in the morning.   Review of Systems: As mentioned in the history of present illness. All other  systems reviewed and are negative. Past Medical History:  Diagnosis Date   Acid reflux    Arthritis    Chest pain    Fatigue    GERD (gastroesophageal reflux disease)    Hyperlipidemia    Past Surgical History:  Procedure Laterality Date   ABDOMINAL HYSTERECTOMY     COLONOSCOPY     KNEE ARTHROPLASTY Left    Social History:  reports that she has quit smoking. Her smoking use included cigarettes. She smoked an average of .25 packs per day. She has never used smokeless tobacco. She reports that she does not drink alcohol and does not use drugs.  Allergies  Allergen Reactions   Codeine Palpitations    Family History  Adopted: Yes  Problem Relation Age of Onset   Asthma Brother    Stomach cancer Brother    Clotting disorder Son        blood clot    Prior to Admission medications   Medication Sig Start Date End Date Taking? Authorizing Provider  cycloSPORINE (RESTASIS) 0.05 % ophthalmic emulsion Place 1 drop into both eyes 2 (two) times daily as needed (for dry eyes).    [provider]  erythromycin ophthalmic ointment at bedtime. 03/27/21   [provider]  gabapentin (NEURONTIN) 300 MG capsule as needed.    [provider]  Glycerin-Polysorbate 80 (REFRESH DRY EYE THERAPY OP) Apply 1 drop to eye daily as needed.    [provider]  hydrOXYzine (ATARAX) 25 MG tablet as needed.    [provider]  lactulose (CHRONULAC) 10 GM/15ML solution Take 15 mLs (10 g total) by mouth 2 (two)  times daily as needed for moderate constipation or severe constipation. 08/02/22   Crain, Whitney L, PA  levocetirizine (XYZAL) 5 MG tablet as needed. 07/04/20   [provider]  Multiple Vitamin (MULTIVITAMIN WITH MINERALS) TABS tablet Take 1 tablet by mouth daily.    [provider]  valACYclovir (VALTREX) 500 MG tablet as needed. 11/23/20   [provider]    Physical Exam: Vitals:   08/06/22 1813 08/06/22 1813 08/06/22 1830  08/06/22 1941  BP:  138/78 (!) 158/87 (!) 148/84  Pulse:  70 77 75  Resp:  Temp: 98.1 F (36.7 C)   98.1 F (36.7 C)  TempSrc: Oral   Oral  SpO2:  100% 100%   Weight:      Height:       Constitutional: NAD, calm, comfortable, thin elderly female laying flat in bed Eyes: lids and conjunctivae normal ENMT: Mucous membranes are moist.  Neck: normal, supple Respiratory: clear to auscultation bilaterally, no wheezing, no crackles. Normal respiratory effort. No accessory muscle use.  Cardiovascular: Regular rate and rhythm, no murmurs / rubs / gallops. No extremity edema.  Abdomen: Soft, moderately distended, mild tenderness to LLQ, no masses palpated. No rebound tenderness, guarding or rigidity. No Bowel sounds.  Musculoskeletal: no clubbing / cyanosis. No joint deformity upper and lower extremities. Normal muscle tone.  Skin: no rashes, lesions, ulcers.  Neurologic: CN 2-12 grossly intact.  Psychiatric: Normal judgment and insight. Alert and oriented x 3. Normal mood. Data Reviewed:  See HPI  Assessment and Plan: * Colonic intussusception -CT abd/pelvis revealed colo-colic intussusception beginning at the junction of the descending and sigmoid colon. Lead point is likely a large intraluminal lipoma, which has increased in size since prior study measuring 6.4 x 4.5 cm on today's exam. There is mild mural thickening of the intussusceptum, without frank evidence of bowel ischemia. Moderate gaseous distension of the colon proximal to the intussusception could reflect partial functional colonic obstruction. Gas is seen within the rectal vault. There is no evidence of small-bowel obstruction. -Reportedly last had colonoscopy in February with just polyps-unable to see records in [facility] - Keep NPO and keep on gentle IV fluids overnight-general surgery to evaluate in the morning  -follows with Dr. Elnoria Howard GI for chronic constipation  HLD (hyperlipidemia) -hold statin while  NPO  Hypokalemia -administered IV and oral potassium. Also given oral Mg in ED. -follow repeat K in the morning  DVT, HX OF -remote and has been off anticoagulation      Advance Care Planning: Full  Consults: GI can be re-consulted PRN. General surgery consulted and will evaluation in the morning.   Family Communication: son at bedside  Severity of Illness: The appropriate patient status for this patient is INPATIENT. Inpatient status is judged to be reasonable and necessary in order to provide the required intensity of service to ensure the patient's safety. The patient's presenting symptoms, physical exam findings, and initial radiographic and laboratory data in the context of their chronic comorbidities is felt to place them at high risk for further clinical deterioration. Furthermore, it is not anticipated that the patient will be medically stable for discharge from the hospital within 2 midnights of admission.   * I certify that at the point of admission it is my clinical judgment that the patient will require inpatient hospital care spanning beyond 2 midnights from the point of admission due to high intensity of service, high risk for further deterioration and high frequency of surveillance  required.*  Author: Anselm Jungling, DO 08/06/2022 8:24 PM  For on call review [email or web address].

## 2022-08-06 NOTE — Assessment & Plan Note (Signed)
-  remote and has been off anticoagulation

## 2022-08-06 NOTE — Assessment & Plan Note (Signed)
-  CT abd/pelvis revealed colo-colic intussusception beginning at the junction of the descending and sigmoid colon. Lead point is likely a large intraluminal lipoma, which has increased in size since prior study measuring 6.4 x 4.5 cm on today's exam. There is mild mural thickening of the intussusceptum, without frank evidence of bowel ischemia. Moderate gaseous distension of the colon proximal to the intussusception could reflect partial functional colonic obstruction. Gas is seen within the rectal vault. There is no evidence of small-bowel obstruction. -Reportedly last had colonoscopy in February with just polyps-unable to see records in [facility] - Keep NPO and keep on gentle IV fluids overnight-general surgery to evaluate in the morning  -follows with Dr. Elnoria Howard GI for chronic constipation

## 2022-08-06 NOTE — Assessment & Plan Note (Signed)
-  hold statin while NPO

## 2022-08-06 NOTE — ED Provider Notes (Signed)
72 yo female with abdominal pain, constipated, last bowel movement 1 week ago. Had flex sig earlier in April, normal. Followed by GI for constipation and abd pain (Dr. Elnoria Howard). Pending CT Found to have hypo K, given K and Mg.  Physical Exam  BP 138/78   Pulse 70   Temp 98.1 F (36.7 C) (Oral)   Resp 16   Ht 5' (1.524 m)   Wt 52.2 kg   SpO2 100%   BMI 22.46 kg/m   Physical Exam Abdominal:     Tenderness: There is abdominal tenderness.     Procedures  Procedures  ED Course / MDM    Medical Decision Making Amount and/or Complexity of Data Reviewed Labs: ordered. Radiology: ordered.  Risk OTC drugs. Prescription drug management. Decision regarding hospitalization.   CT shows concern for intussusception beginning at the junction of the descending and sigmoid colon, lead point likely a large intraluminal lipoma, discussed with Dr. Loreta Ave, on-call for Dr. Elnoria Howard who requests general surgery to manage patient, GI available if needed.  Case discussed with Dr. Luisa Hart, on-call with general surgery, requests hospitalist admit, general surgery will see tomorrow,  requests n.p.o.  Case discussed with Dr. Cyndia Bent with [facility] who will consult for admission.    Jeannie Fend, PA-C 08/06/22 1841    Terald Sleeper, MD 08/06/22 [phone]

## 2022-08-07 ENCOUNTER — Encounter (HOSPITAL_COMMUNITY)
Admission: EM | Disposition: A | Discharge status: Skilled Nursing Facility | Payer: Self-pay | Source: Home / Self Care | Attending: Internal Medicine

## 2022-08-07 ENCOUNTER — Inpatient Hospital Stay (HOSPITAL_COMMUNITY): Payer: Medicare HMO | Admitting: Certified Registered Nurse Anesthetist

## 2022-08-07 ENCOUNTER — Other Ambulatory Visit: Payer: Self-pay

## 2022-08-07 ENCOUNTER — Encounter (HOSPITAL_COMMUNITY): Payer: Self-pay | Admitting: Family Medicine

## 2022-08-07 DIAGNOSIS — K5909 Other constipation: Secondary | ICD-10-CM | POA: Diagnosis not present

## 2022-08-07 DIAGNOSIS — Z87891 Personal history of nicotine dependence: Secondary | ICD-10-CM

## 2022-08-07 DIAGNOSIS — K561 Intussusception: Secondary | ICD-10-CM

## 2022-08-07 DIAGNOSIS — K5669 Other partial intestinal obstruction: Secondary | ICD-10-CM | POA: Diagnosis not present

## 2022-08-07 DIAGNOSIS — D649 Anemia, unspecified: Secondary | ICD-10-CM | POA: Diagnosis not present

## 2022-08-07 HISTORY — PX: LAPAROTOMY: SHX154

## 2022-08-07 LAB — BASIC METABOLIC PANEL
Anion gap: 11 (ref 5–15)
BUN: <5 mg/dL — ABNORMAL LOW (ref 8–23)
CO2: 24 mmol/L (ref 22–32)
Calcium: 8.4 mg/dL — ABNORMAL LOW (ref 8.9–10.3)
Chloride: 103 mmol/L (ref 98–111)
Creatinine, Ser: 0.45 mg/dL (ref 0.44–1.00)
GFR, Estimated: >60 mL/min (ref >60)
Glucose, Bld: 76 mg/dL (ref 70–99)
Potassium: 3.3 mmol/L — ABNORMAL LOW (ref 3.5–5.1)
Sodium: 138 mmol/L (ref 135–145)

## 2022-08-07 LAB — TYPE AND SCREEN
ABO/RH(D): B POS
Antibody Screen: NEGATIVE

## 2022-08-07 LAB — ABO/RH: ABO/RH(D): B POS

## 2022-08-07 SURGERY — LAPAROTOMY, EXPLORATORY
Anesthesia: General

## 2022-08-07 MED ORDER — ACETAMINOPHEN 500 MG PO TABS
1000.0000 mg | ORAL_TABLET | Freq: Four times a day (QID) | ORAL | Status: DC
  Administered 2022-08-07 – 2022-08-11 (×11): 1000 mg via ORAL
  Filled 2022-08-07 (×13): qty 2

## 2022-08-07 MED ORDER — MIDAZOLAM HCL 5 MG/5ML IJ SOLN
INTRAMUSCULAR | Status: DC | PRN
  Administered 2022-08-07: 2 mg via INTRAVENOUS

## 2022-08-07 MED ORDER — SUGAMMADEX SODIUM 200 MG/2ML IV SOLN
INTRAVENOUS | Status: DC | PRN
  Administered 2022-08-07: 200 mg via INTRAVENOUS

## 2022-08-07 MED ORDER — HEPARIN SODIUM (PORCINE) 5000 UNIT/ML IJ SOLN
5000.0000 [IU] | Freq: Once | INTRAMUSCULAR | Status: DC
  Administered 2022-08-07: 5000 [IU] via SUBCUTANEOUS
  Filled 2022-08-07: qty 1

## 2022-08-07 MED ORDER — LIDOCAINE 2% (20 MG/ML) 5 ML SYRINGE
INTRAMUSCULAR | Status: DC | PRN
  Administered 2022-08-07: 80 mg via INTRAVENOUS

## 2022-08-07 MED ORDER — FENTANYL CITRATE (PF) 100 MCG/2ML IJ SOLN
INTRAMUSCULAR | Status: AC
  Filled 2022-08-07: qty 2

## 2022-08-07 MED ORDER — ONDANSETRON HCL 4 MG/2ML IJ SOLN
4.0000 mg | Freq: Four times a day (QID) | INTRAMUSCULAR | Status: DC | PRN
  Administered 2022-08-09 – 2022-08-10 (×2): 4 mg via INTRAVENOUS
  Filled 2022-08-07 (×2): qty 2

## 2022-08-07 MED ORDER — OXYCODONE HCL 5 MG PO TABS: 5.0000 mg | ORAL_TABLET | Freq: Once | ORAL | Status: DC | PRN

## 2022-08-07 MED ORDER — BUPIVACAINE-EPINEPHRINE (PF) 0.5% -1:200000 IJ SOLN
INTRAMUSCULAR | Status: AC
  Filled 2022-08-07: qty 30

## 2022-08-07 MED ORDER — SODIUM CHLORIDE 0.9 % IV SOLN
2.0000 g | INTRAVENOUS | Status: DC
  Filled 2022-08-07: qty 2

## 2022-08-07 MED ORDER — OXYCODONE HCL 5 MG/5ML PO SOLN: 5.0000 mg | Freq: Once | ORAL | Status: DC | PRN

## 2022-08-07 MED ORDER — ONDANSETRON HCL 4 MG/2ML IJ SOLN
INTRAMUSCULAR | Status: AC
  Filled 2022-08-07: qty 2

## 2022-08-07 MED ORDER — PROPOFOL 10 MG/ML IV BOLUS
INTRAVENOUS | Status: DC | PRN
  Administered 2022-08-07: 110 mg via INTRAVENOUS

## 2022-08-07 MED ORDER — PROPOFOL 10 MG/ML IV BOLUS
INTRAVENOUS | Status: AC
  Filled 2022-08-07: qty 20

## 2022-08-07 MED ORDER — ACETAMINOPHEN 500 MG PO TABS
1000.0000 mg | ORAL_TABLET | ORAL | Status: DC
  Administered 2022-08-07: 1000 mg via ORAL
  Filled 2022-08-07: qty 2

## 2022-08-07 MED ORDER — DEXAMETHASONE SODIUM PHOSPHATE 10 MG/ML IJ SOLN
INTRAMUSCULAR | Status: AC
  Filled 2022-08-07: qty 1

## 2022-08-07 MED ORDER — OXYCODONE HCL 5 MG PO TABS: 5.0000 mg | ORAL_TABLET | ORAL | Status: DC | PRN

## 2022-08-07 MED ORDER — HYDROMORPHONE HCL 1 MG/ML IJ SOLN
0.5000 mg | INTRAMUSCULAR | Status: DC | PRN
  Administered 2022-08-07 – 2022-08-08 (×2): 1 mg via INTRAVENOUS
  Filled 2022-08-07 (×2): qty 1

## 2022-08-07 MED ORDER — KETAMINE HCL 10 MG/ML IJ SOLN
INTRAMUSCULAR | Status: DC | PRN
  Administered 2022-08-07: 20 mg via INTRAVENOUS

## 2022-08-07 MED ORDER — LACTATED RINGERS IV SOLN: INTRAVENOUS | Status: DC

## 2022-08-07 MED ORDER — LORAZEPAM 2 MG/ML IJ SOLN
0.5000 mg | Freq: Four times a day (QID) | INTRAMUSCULAR | Status: DC | PRN
  Administered 2022-08-07: 0.5 mg via INTRAVENOUS
  Filled 2022-08-07: qty 1

## 2022-08-07 MED ORDER — ROCURONIUM BROMIDE 10 MG/ML (PF) SYRINGE
PREFILLED_SYRINGE | INTRAVENOUS | Status: AC
  Filled 2022-08-07: qty 10

## 2022-08-07 MED ORDER — CHLORHEXIDINE GLUCONATE 0.12 % MT SOLN
15.0000 mL | Freq: Once | OROMUCOSAL | Status: AC
  Administered 2022-08-07: 15 mL via OROMUCOSAL

## 2022-08-07 MED ORDER — POTASSIUM CHLORIDE 10 MEQ/100ML IV SOLN: 10.0000 meq | INTRAVENOUS | Status: DC

## 2022-08-07 MED ORDER — LIDOCAINE HCL (PF) 2 % IJ SOLN
INTRAMUSCULAR | Status: AC
  Filled 2022-08-07: qty 5

## 2022-08-07 MED ORDER — BUPIVACAINE LIPOSOME 1.3 % IJ SUSP: 20.0000 mL | Freq: Once | INTRAMUSCULAR | Status: DC

## 2022-08-07 MED ORDER — POTASSIUM CHLORIDE 10 MEQ/100ML IV SOLN
10.0000 meq | INTRAVENOUS | Status: AC
  Administered 2022-08-07: 10 meq via INTRAVENOUS
  Filled 2022-08-07: qty 100

## 2022-08-07 MED ORDER — ALVIMOPAN 12 MG PO CAPS
12.0000 mg | ORAL_CAPSULE | ORAL | Status: DC
  Administered 2022-08-07: 12 mg via ORAL
  Filled 2022-08-07: qty 1

## 2022-08-07 MED ORDER — FENTANYL CITRATE (PF) 250 MCG/5ML IJ SOLN
INTRAMUSCULAR | Status: AC
  Filled 2022-08-07: qty 5

## 2022-08-07 MED ORDER — ONDANSETRON HCL 4 MG/2ML IJ SOLN
INTRAMUSCULAR | Status: DC | PRN
  Administered 2022-08-07: 4 mg via INTRAVENOUS

## 2022-08-07 MED ORDER — 0.9 % SODIUM CHLORIDE (POUR BTL) OPTIME
TOPICAL | Status: DC | PRN
  Administered 2022-08-07: 2000 mL

## 2022-08-07 MED ORDER — SODIUM CHLORIDE 0.9 % IV SOLN: INTRAVENOUS | Status: DC | PRN

## 2022-08-07 MED ORDER — FENTANYL CITRATE (PF) 100 MCG/2ML IJ SOLN
INTRAMUSCULAR | Status: DC | PRN
  Administered 2022-08-07: 100 ug via INTRAVENOUS
  Administered 2022-08-07: 50 ug via INTRAVENOUS

## 2022-08-07 MED ORDER — BUPIVACAINE-EPINEPHRINE (PF) 0.5% -1:200000 IJ SOLN
INTRAMUSCULAR | Status: DC | PRN
  Administered 2022-08-07: 10 mL via PERINEURAL

## 2022-08-07 MED ORDER — KETAMINE HCL 50 MG/5ML IJ SOSY
PREFILLED_SYRINGE | INTRAMUSCULAR | Status: AC
  Filled 2022-08-07: qty 5

## 2022-08-07 MED ORDER — KETOROLAC TROMETHAMINE 30 MG/ML IJ SOLN: 15.0000 mg | Freq: Once | INTRAMUSCULAR | Status: DC | PRN

## 2022-08-07 MED ORDER — SODIUM CHLORIDE 0.9 % IV SOLN
INTRAVENOUS | Status: DC | PRN
  Administered 2022-08-07: 2 g via INTRAVENOUS

## 2022-08-07 MED ORDER — ENOXAPARIN SODIUM 30 MG/0.3ML IJ SOSY
30.0000 mg | PREFILLED_SYRINGE | Freq: Two times a day (BID) | INTRAMUSCULAR | Status: DC
  Administered 2022-08-09 – 2022-08-11 (×4): 30 mg via SUBCUTANEOUS
  Filled 2022-08-07 (×7): qty 0.3

## 2022-08-07 MED ORDER — ENOXAPARIN SODIUM 30 MG/0.3ML IJ SOSY: 30.0000 mg | PREFILLED_SYRINGE | Freq: Two times a day (BID) | INTRAMUSCULAR | Status: DC

## 2022-08-07 MED ORDER — ROCURONIUM BROMIDE 10 MG/ML (PF) SYRINGE
PREFILLED_SYRINGE | INTRAVENOUS | Status: DC | PRN
  Administered 2022-08-07: 40 mg via INTRAVENOUS

## 2022-08-07 MED ORDER — HYDROMORPHONE HCL 1 MG/ML IJ SOLN: 0.2500 mg | INTRAMUSCULAR | Status: DC | PRN

## 2022-08-07 MED ORDER — DEXAMETHASONE SODIUM PHOSPHATE 4 MG/ML IJ SOLN
INTRAMUSCULAR | Status: DC | PRN
  Administered 2022-08-07: 5 mg via INTRAVENOUS

## 2022-08-07 MED ORDER — METHOCARBAMOL 500 MG PO TABS
500.0000 mg | ORAL_TABLET | Freq: Three times a day (TID) | ORAL | Status: DC
  Administered 2022-08-07 – 2022-08-09 (×8): 500 mg via ORAL
  Filled 2022-08-07 (×8): qty 1

## 2022-08-07 MED ORDER — MIDAZOLAM HCL 2 MG/2ML IJ SOLN
INTRAMUSCULAR | Status: AC
  Filled 2022-08-07: qty 2

## 2022-08-07 MED ORDER — CHLORHEXIDINE GLUCONATE 4 % EX SOLN: 1.0000 | Freq: Once | CUTANEOUS | Status: DC

## 2022-08-07 SURGICAL SUPPLY — 49 items
BAG COUNTER SPONGE SURGICOUNT (BAG) IMPLANT
CHLORAPREP W/TINT 26 (MISCELLANEOUS) ×1 IMPLANT
COVER MAYO STAND STRL (DRAPES) ×1 IMPLANT
COVER SURGICAL LIGHT HANDLE (MISCELLANEOUS) ×1 IMPLANT
DRAPE LAPAROSCOPIC ABDOMINAL (DRAPES) ×1 IMPLANT
DRAPE UTILITY XL STRL (DRAPES) ×1 IMPLANT
DRAPE WARM FLUID 44X44 (DRAPES) ×1 IMPLANT
DRSG OPSITE POSTOP 4X10 (GAUZE/BANDAGES/DRESSINGS) IMPLANT
DRSG OPSITE POSTOP 4X6 (GAUZE/BANDAGES/DRESSINGS) IMPLANT
DRSG OPSITE POSTOP 4X8 (GAUZE/BANDAGES/DRESSINGS) IMPLANT
ELECT REM PT RETURN 15FT ADLT (MISCELLANEOUS) ×1 IMPLANT
GLOVE BIOGEL PI IND STRL 7.0 (GLOVE) ×1 IMPLANT
GLOVE SURG SS PI 7.0 STRL IVOR (GLOVE) ×1 IMPLANT
GOWN STRL REUS W/ TWL LRG LVL3 (GOWN DISPOSABLE) ×1 IMPLANT
GOWN STRL REUS W/ TWL XL LVL3 (GOWN DISPOSABLE) IMPLANT
GOWN STRL REUS W/TWL LRG LVL3 (GOWN DISPOSABLE) ×1
GOWN STRL REUS W/TWL XL LVL3 (GOWN DISPOSABLE)
HANDLE SUCTION POOLE (INSTRUMENTS) ×1 IMPLANT
KIT BASIN OR (CUSTOM PROCEDURE TRAY) ×1 IMPLANT
KIT TURNOVER KIT A (KITS) IMPLANT
LIGASURE IMPACT 36 18CM CVD LR (INSTRUMENTS) IMPLANT
PACK GENERAL/GYN (CUSTOM PROCEDURE TRAY) ×1 IMPLANT
RELOAD BL CONTOUR (ENDOMECHANICALS) ×1 IMPLANT
RELOAD PROXIMATE 100 BLUE (MISCELLANEOUS) IMPLANT
RELOAD PROXIMATE 100MM BLUE (MISCELLANEOUS)
RELOAD STAPLE 100 3.8 BLU REG (MISCELLANEOUS) IMPLANT
RELOAD STAPLE 40 BLU REG (ENDOMECHANICALS) IMPLANT
STAPLER CVD CUT BL 40 RELOAD (ENDOMECHANICALS) ×1 IMPLANT
STAPLER CVD CUT BLU 40 RELOAD (ENDOMECHANICALS) IMPLANT
STAPLER PROXIMATE 100MM BLUE (MISCELLANEOUS) IMPLANT
STAPLER VISISTAT 35W (STAPLE) ×1 IMPLANT
SUCTION POOLE HANDLE (INSTRUMENTS) ×1
SUT MNCRL AB 4-0 PS2 18 (SUTURE) IMPLANT
SUT PDS AB 0 CT1 36 (SUTURE) IMPLANT
SUT PROLENE 2 0 SH DA (SUTURE) IMPLANT
SUT SILK 2 0 (SUTURE) ×1
SUT SILK 2 0 SH CR/8 (SUTURE) ×1 IMPLANT
SUT SILK 2-0 18XBRD TIE 12 (SUTURE) ×1 IMPLANT
SUT SILK 3 0 (SUTURE) ×1
SUT SILK 3 0 SH CR/8 (SUTURE) ×1 IMPLANT
SUT SILK 3-0 18XBRD TIE 12 (SUTURE) ×1 IMPLANT
SUT VIC AB 2-0 SH 27 (SUTURE) ×1
SUT VIC AB 2-0 SH 27X BRD (SUTURE) IMPLANT
SUT VIC AB 3-0 SH 18 (SUTURE) IMPLANT
TOWEL OR 17X26 10 PK STRL BLUE (TOWEL DISPOSABLE) ×1 IMPLANT
TOWEL OR NON WOVEN STRL DISP B (DISPOSABLE) ×1 IMPLANT
TRAY FOLEY MTR SLVR 14FR STAT (SET/KITS/TRAYS/PACK) ×1 IMPLANT
TRAY FOLEY MTR SLVR 16FR STAT (SET/KITS/TRAYS/PACK) ×1 IMPLANT
YANKAUER SUCT BULB TIP NO VENT (SUCTIONS) ×1 IMPLANT

## 2022-08-07 NOTE — Progress Notes (Signed)
PROGRESS NOTE    Sarah Moore  ZOX:[id number] DOB: 16-Oct-1950 DOA: 08/06/2022 PCP: Norm Salt, PA    Brief Narrative:   Sarah Moore is a 72 y.o. female with past medical history significant for hyperlipidemia, chronic constipation, history of remote DVT not on anticoagulation who presented to [facility] ED on 4/22 with complaints of abdominal pain.  Patient reports last bowel movement 1.5 weeks ago.  Also endorses loss of appetite.  Patient was seen by urgent care 2 days prior to admission and was given Linzess and Fleet enema without improvement.  Denies nausea or vomiting.  No previous history of abdominal surgery.  Patient follows with GI outpatient, Dr. Elnoria Howard.  Recent flex sigmoidoscopy 4/9 that was reported as normal and colonoscopy February 2024 with only polyps noted.  At that time patient was advised to start taking lactulose.  In the ED, temperature 98.1 F, HR 85, RR 16, BP 123/106, SpO2 98% on room air.  WBC 7.1, hemoglobin 10.9, platelets 303.  Sodium 138, potassium 2.5, chloride 100, CO2 28, glucose 96, BUN 8, creatinine 0.36.  Lipase 24.  AST 14, ALT 10, total bili 1.3.  Urinalysis unrevealing.  CT abdomen/pelvis with colocolonic intussusception at the junction of the descending and sigmoid colon likely due to a large intraluminal lipoma serving as a lead point, mild mural thickening of the into the septum without frank evidence of bowel ischemia concerning for partial functional colonic obstruction with moderate gaseous distention of the proximal colon.  EDP discussed with GI, Dr. Loreta Ave who recommended general surgery consult.  Dr. Luisa Hart was consulted who advised maintain n.p.o. status and recommended hospitalist admission.  TRH consulted for admission for further evaluation management of large bowel obstruction.  Assessment & Plan:   Large bowel obstruction/colonic intussusception History of chronic constipation The patient presented to ED with persistent  abdominal pain.  History of chronic constipation, last reported bowel movement 1.5 weeks prior to admission.  Follows with GI outpatient, Dr. Elnoria Howard.  Patient is afebrile without leukocytosis.  CT abdomen/pelvis on admission with findings of colo-colonic intussusception at the junction of the descending and sigmoid colon likely due to a large intraluminal lipoma serving as a lead point, mild mural thickening of the into the septum without frank evidence of bowel ischemia concerning for partial functional colonic obstruction with moderate gaseous distention of the proximal colon. -- General Surgery following, appreciate assistance -- LR at 75 mL/h -- N.p.o. for planned ex lap with colostomy today  Hypokalemia Potassium 2.5 on admission, likely secondary to poor oral intake in the days preceding hospitalization.  Repleted. -- Repeat BMP in a.m. to include magnesium  Hyperlipidemia Currently not on statin outpatient.   DVT prophylaxis: enoxaparin (LOVENOX) injection 30 mg Start: 08/07/22 1000 SCDs Start: 08/06/22 1950    Code Status: Full Code Family Communication: No family present at bedside this morning  Disposition Plan:  Level of care: Med-Surg Status is: Inpatient Remains inpatient appropriate because: Pending operative management for large bowel obstruction    Consultants:  General surgery  Procedures:  None  Antimicrobials:  None   Subjective: Patient seen examined bedside, lying in bed.  Continues to complain of abdominal pain.  Reports passing flatus but no bowel movement.  Seen by general surgery this morning with plan for ex lap with colostomy this afternoon.  No other questions or concerns at this time.  Denies headache, no dizziness, no chest pain, no palpitations, no shortness of breath, no fever/chills/night sweats, no nausea/vomiting/diarrhea, no  focal weakness, no fatigue, no paresthesias.  No acute events overnight per nursing.  Objective: Vitals:   08/06/22 1941  08/07/22 0154 08/07/22 0955 08/07/22 1014  BP: (!) 148/84 135/84 121/82   Pulse: 75 77 86   Resp: Temp: 98.1 F (36.7 C) 98.4 F (36.9 C) 98 F (36.7 C)   TempSrc: Oral Oral    SpO2: 100% 98% 96%   Weight:    52.2 kg  Height:     (1.499 m)    Intake/Output Summary (Last 24 hours) at 08/07/2022 1211 Last data filed at 08/07/2022 1205 Gross per 24 hour  Intake 258.16 ml  Output --  Net 258.16 ml   Filed Weights   08/06/22 1020 08/07/22 1014  Weight: 52.2 kg 52.2 kg    Examination:  Physical Exam: GEN: NAD, alert and oriented x 3, ill in appearance HEENT: NCAT, PERRL, EOMI, sclera clear, MMM PULM: CTAB w/o wheezes/crackles, normal respiratory effort, on room air CV: RRR w/o M/G/R GI: abd soft, mild distention, slight TTP epigastric region, faint bowel sounds, no R/G/M MSK: no peripheral edema, moves all EXTR independently NEURO: No focal neurological deficits appreciated PSYCH: normal mood/affect Integumentary: No rashes/lesions/wounds noted on exposed skin surfaces    Data Reviewed: I have personally reviewed following labs and imaging studies  CBC: Recent Labs  Lab 08/02/22 1033 08/06/22 1230  WBC 6.4 7.1  NEUTROABS 3.1 4.0  HGB 11.8* 10.9*  HCT 35.8* 33.8*  MCV 88.2 90.6  PLT 254 303   Basic Metabolic Panel: Recent Labs  Lab 08/02/22 1033 08/06/22 1230 08/07/22 0653  NA 141 138 138  K 3.1* 2.5* 3.3*  CL 102 100 103  CO2 GLUCOSE 86 96 76  BUN 8 8 <5*  CREATININE 0.55 0.36* 0.45  CALCIUM 9.8 9.1 8.4*   GFR: Estimated Creatinine Clearance: 47.7 mL/min (by C-G formula based on SCr of 0.45 mg/dL). Liver Function Tests: Recent Labs  Lab 08/06/22 1230  AST 14*  ALT 10  ALKPHOS 55  BILITOT 1.3*  PROT 7.2  ALBUMIN 3.3*   Recent Labs  Lab 08/06/22 1230  LIPASE 24   No results for input(s): "AMMONIA" in the last 168 hours. Coagulation Profile: No results for input(s): "INR", "PROTIME" in the last 168  hours. Cardiac Enzymes: No results for input(s): "CKTOTAL", "CKMB", "CKMBINDEX", "TROPONINI" in the last 168 hours. BNP (last 3 results) No results for input(s): "PROBNP" in the last 8760 hours. HbA1C: No results for input(s): "HGBA1C" in the last 72 hours. CBG: No results for input(s): "GLUCAP" in the last 168 hours. Lipid Profile: No results for input(s): "CHOL", "HDL", "LDLCALC", "TRIG", "CHOLHDL", "LDLDIRECT" in the last 72 hours. Thyroid Function Tests: No results for input(s): "TSH", "T4TOTAL", "FREET4", "T3FREE", "THYROIDAB" in the last 72 hours. Anemia Panel: No results for input(s): "VITAMINB12", "FOLATE", "FERRITIN", "TIBC", "IRON", "RETICCTPCT" in the last 72 hours. Sepsis Labs: No results for input(s): "PROCALCITON", "LATICACIDVEN" in the last 168 hours.  No results found for this or any previous visit (from the past 240 hour(s)).       Radiology Studies: CT ABDOMEN PELVIS W CONTRAST  Result Date: 08/06/2022 CLINICAL DATA:  Hematuria, suspected bowel obstruction EXAM: CT ABDOMEN AND PELVIS WITH CONTRAST TECHNIQUE: Multidetector CT imaging of the abdomen and pelvis was performed using the standard protocol following bolus administration of intravenous contrast. RADIATION DOSE REDUCTION: This exam was performed according to the departmental dose-optimization program which includes automated exposure control, adjustment of  the mA and/or kV according to patient size and/or use of iterative reconstruction technique. CONTRAST:  80mL OMNIPAQUE IOHEXOL 300 MG/ML  SOLN COMPARISON:  08/02/2022, 07/02/2012 FINDINGS: Lower chest: No acute pleural or parenchymal lung disease. Hepatobiliary: Involution of the cavernous hemangioma seen within the left lobe liver on prior exam. This now measures up to 2.3 cm in maximal dimension, previously measuring 5.6 cm. No acute liver abnormality. The gallbladder is unremarkable. No biliary duct dilation. Pancreas: Moderate pancreatic atrophy. No acute  inflammatory changes or pancreatic duct dilation. Spleen: Normal in size without focal abnormality. Adrenals/Urinary Tract: The kidneys enhance normally and symmetrically. No urinary tract calculi or obstructive uropathy. Bladder is decompressed, the bladder is minimally distended, without focal abnormality. The adrenals are unremarkable. Stomach/Bowel: There is a colo-colic intussusception beginning at the junction of the descending and sigmoid colon. Lead point is likely a large intraluminal lipoma, which has increased in size since prior study measuring 6.4 x 4.5 cm on today's exam. There is mild mural thickening of the intussusceptum, without frank evidence of bowel ischemia. Moderate gaseous distension of the colon proximal to the intussusception could reflect partial functional colonic obstruction. Gas is seen within the rectal vault. There is no evidence of small-bowel obstruction. Normal appendix right lower quadrant. Vascular/Lymphatic: There is marked eccentric atheromatous plaque within the aorta at the diaphragmatic hiatus, with approximately 50% stenosis in that region. Atherosclerosis throughout the remainder of the visualized thoracoabdominal aorta without evidence of aneurysm or dissection. No pathologic adenopathy. Reproductive: Status post hysterectomy. No adnexal masses. Other: No free fluid or free intraperitoneal gas. No abdominal wall hernia. Musculoskeletal: There are no acute or destructive bony lesions. Reconstructed images demonstrate no additional findings. IMPRESSION: 1. Colo-colic intussusception at the junction of the descending and sigmoid colon, likely due to a large intraluminal lipoma serving as the lead point. There is mild mural thickening of the intussusceptum, without frank evidence of bowel ischemia. Likely partial functional colonic obstruction with moderate gaseous distension of the proximal colon. Gas is seen within the rectal vault. Surgical consultation recommended. 2.  Aortic Atherosclerosis (ICD10-I70.0). Moderate eccentric plaque located at the diaphragmatic hiatus, with approximately 50% narrowing of the aortic lumen in that region. 3. Significant involution of the cavernous hemangioma within the left lobe liver as above. Critical Value/emergent results were called by telephone at the time of interpretation on 08/06/2022 at 5:06 pm to provider [facility] , who verbally acknowledged these results. Electronically Signed   By: Sharlet Salina M.D.   On: 08/06/2022 17:08        Scheduled Meds:  enoxaparin (LOVENOX) injection  30 mg Subcutaneous Q12H   Continuous Infusions:  lactated ringers 75 mL/hr at 08/07/22 1149   potassium chloride       LOS: 1 day    Time spent: 52 minutes spent on chart review, discussion with nursing staff, consultants, updating family and interview/physical exam; more than 50% of that time was spent in counseling and/or coordination of care.    Alvira Philips [place], DO [facility] Available via Epic secure chat 7am-7pm After these hours, please refer to coverage provider listed on [email or web address] 08/07/2022, 12:11 PM

## 2022-08-07 NOTE — Progress Notes (Signed)
  Transition of Care [facility]) Screening Note   Patient Details  Name: Sarah Moore Date of Birth: [birth year]   Transition of Care [facility]) CM/SW Contact:    Otelia Santee, LCSW Phone Number: 08/07/2022, 10:38 AM    Transition of Care Department [facility]) has reviewed patient and no TOC needs have been identified at this time. We will continue to monitor patient advancement through interdisciplinary progression rounds. If new patient transition needs arise, please place a TOC consult.

## 2022-08-07 NOTE — Progress Notes (Signed)
  Day of Surgery   Chief Complaint/Subjective: Nervous, came in for abdominal pain, vomiting, and blood per rectum  Objective: Vital signs in last 24 hours: Temp:  [98.1 F (36.7 C)-98.4 F (36.9 C)] 98.4 F (36.9 C) (04/23 0154) Pulse Rate:  [70-85] 77 (04/23 0154) Resp:  [14-18] 18 (04/23 0154) BP: (123-158)/(78-106) 135/84 (04/23 0154) SpO2:  [98 %-100 %] 98 % (04/23 0154) Weight:  [52.2 kg] 52.2 kg (04/22 1020)   Intake/Output from previous day: 04/22 0701 - 04/23 0700 In: 158.2 [I.V.:158.2] Out: -   PE: Gen: NAD Resp: nonlabored Card: RRR Abd: soft, NT, ND  Lab Results:  Recent Labs    08/06/22 1230  WBC 7.1  HGB 10.9*  HCT 33.8*  PLT 303   Recent Labs    08/06/22 1230 08/07/22 0653  NA 138 138  K 2.5* 3.3*  CL 100 103  CO2 28 24  GLUCOSE 96 76  BUN 8 <5*  CREATININE 0.36* 0.45  CALCIUM 9.1 8.4*   No results for input(s): "LABPROT", "INR" in the last 72 hours.    Component Value Date/Time   NA 138 08/07/2022 0653   NA 144 10/11/2008 1245   K 3.3 (L) 08/07/2022 0653   K 4.4 10/11/2008 1245   CL 103 08/07/2022 0653   CL 105 10/11/2008 1245   CO2 24 08/07/2022 0653   CO2 28 10/11/2008 1245   GLUCOSE 76 08/07/2022 0653   GLUCOSE 123 (H) 10/11/2008 1245   BUN <5 (L) 08/07/2022 0653   BUN 15 10/11/2008 1245   CREATININE 0.45 08/07/2022 0653   CREATININE 0.6 10/11/2008 1245   CALCIUM 8.4 (L) 08/07/2022 0653   CALCIUM 10.3 10/11/2008 1245   PROT 7.2 08/06/2022 1230   PROT 7.6 10/11/2008 1245   ALBUMIN 3.3 (L) 08/06/2022 1230   ALBUMIN 3.2 (L) 10/11/2008 1245   AST 14 (L) 08/06/2022 1230   AST 29 10/11/2008 1245   ALT 10 08/06/2022 1230   ALT 39 10/11/2008 1245   ALKPHOS 55 08/06/2022 1230   ALKPHOS 48 10/11/2008 1245   BILITOT 1.3 (H) 08/06/2022 1230   BILITOT 1.40 10/11/2008 1245   GFRNONAA >60 08/07/2022 0653   GFRAA >60 12/17/2019 1530    Assessment/Plan  s/p Procedure(s): EXPLORATORY LAPAROTOMY WITH COLOSTOMY 08/07/2022   FEN -  NPO VTE - lovenox ID - periop Disposition - OR today for partial colectomy with ostomy for colonic intussusception vs mass -I discussed the details of the surgery, living with an ostomy, and risks of bleeding, infection, wound infection, abscess, injury to surrounding structures including intestine and ureter. She showed good understanding and wanted to proceed.   LOS: 1 day   I reviewed last 24 h vitals and pain scores, last 48 h intake and output, last 24 h labs and trends, and last 24 h imaging results.  This care required high  level of medical decision making.   De Blanch [facility] at [facility] 08/07/2022, 9:29 AM Please see Amion for pager number during day hours 7:00am-4:30pm or 7:00am -11:30am on weekends

## 2022-08-07 NOTE — Op Note (Signed)
Preoperative diagnosis: colonic intussusception  Postoperative diagnosis: same   Procedure: partial colectomy with end colostomy  Surgeon: Feliciana Rossetti, M.D.  Asst: Barnetta Chapel, [facility]  Anesthesia: GETA  Indications for procedure: Sarah Moore is a 72 y.o. year old female with symptoms of abdominal pain and vomiting. CT scan showing lipoma with intussusception of the left colon into the sigmoid colon.  Description of procedure: The patient was brought into the operative suite. Anesthesia was administered with General endotracheal anesthesia. WHO checklist was applied. The patient was then placed in supine position. The area was prepped and draped in the usual sterile fashion.  A lower midline incision was made.  Cautery was used dissect down through subcutaneous tissues and the fascia was entered in the midline.  There were adhesions of the omentum to the abdominal wall these were taken down with cautery.  The sigmoid colon and left colon were inspected, there was no intussusception.  There was a large palpable intraluminal mass of the distal left colon.  The left colon was mobilized by dividing the white line of Toldt with cautery.  Portion of the distal sigmoid was chosen for transection.  A blue load contour was used to transect this place.  2-0 Prolene sutures were used to mark the stump staple line.  LigaSure was used to divide the mesentery of the sigmoid colon.  Care was taken to stay out of the retroperitoneum and the ureter was identified safely.  Dissection of the mesentery was continued beyond the mass.  A blue load contour was chosen approximately 6 cm proximal to the mass to divide the left colon.  Left colon was further mobilized to allow for appropriate colostomy.  A small circular incision was made in the left upper quadrant cautery was used to dissect down through subcutaneous tissues and the rectus muscle was entered and split.  The end of the left colon was then brought up  through this area and sutured in place.  Next, 2-0 Vicryl was used to close peritoneum.  0 PDS was used to close the fascia in running fashion.  Next, the left colon and was opened and matured into an ostomy with 3-0 Vicryl in standard fashion.  Staples were used to close skin.  Dressing was put in place.  All counts were correct.  Findings: large intraluminal mass of the distal left colon  Specimen: sigmoid colon  Implant: none   Blood loss: 100 ml  Local anesthesia:  10 ml marcaine   Complications: none  Feliciana Rossetti, M.D. General, Bariatric, & Minimally Invasive Surgery [facility]

## 2022-08-07 NOTE — Plan of Care (Signed)

## 2022-08-07 NOTE — Anesthesia Preprocedure Evaluation (Addendum)
Anesthesia Evaluation  Patient identified by MRN, date of birth, ID band Patient awake    Reviewed: Allergy & Precautions, NPO status , Patient's Chart, lab work & pertinent test results  Airway Mallampati: II  TM Distance: >3 FB Neck ROM: Full    Dental no notable dental hx. (+) Teeth Intact, Dental Advisory Given   Pulmonary former smoker   Pulmonary exam normal breath sounds clear to auscultation       Cardiovascular Normal cardiovascular exam Rhythm:Regular Rate:Normal     Neuro/Psych  negative psych ROS   GI/Hepatic ,GERD  ,,Chronic constipation   Endo/Other    Renal/GU Lab Results      Component                Value               Date                      CREATININE               0.45                08/07/2022                BUN                      <5 (L)              08/07/2022                NA                       138                 08/07/2022                K                        3.3 (L)             08/07/2022                   Musculoskeletal  (+) Arthritis ,    Abdominal   Peds  Hematology  (+) Blood dyscrasia, anemia Lab Results      Component                Value               Date                      WBC                      7.1                 08/06/2022                HGB                      10.9 (L)            08/06/2022                HCT                      33.8 (L)  08/06/2022                MCV                      90.6                08/06/2022                PLT                      303                 08/06/2022              Anesthesia Other Findings All: Codeine  Reproductive/Obstetrics                             Anesthesia Physical Anesthesia Plan  ASA: 3  Anesthesia Plan: General   Post-op Pain Management: Ketamine IV* and Ofirmev IV (intra-op)*   Induction: Intravenous  PONV Risk Score and Plan: 4 or greater and Treatment may  vary due to age or medical condition, Ondansetron, Midazolam and Dexamethasone  Airway Management Planned: Oral ETT  Additional Equipment: None  Intra-op Plan:   Post-operative Plan: Extubation in OR  Informed Consent: I have reviewed the patients History and Physical, chart, labs and discussed the procedure including the risks, benefits and alternatives for the proposed anesthesia with the patient or authorized representative who has indicated his/her understanding and acceptance.     Dental advisory given  Plan Discussed with: CRNA, Anesthesiologist and Surgeon  Anesthesia Plan Comments:        Anesthesia Quick Evaluation

## 2022-08-07 NOTE — Consult Note (Signed)
WOC received consult for this patient with new end colostomy.  Patient marked by [facility] team earlier in day.    WOC team will follow for ostomy education and support.   Thank you,    Priscella Mann MSN, RN-BC, [employer] [phone]

## 2022-08-07 NOTE — Anesthesia Postprocedure Evaluation (Signed)
Anesthesia Post Note  Patient: Sarah Moore  Procedure(s) Performed: EXPLORATORY LAPAROTOMY, sigmoid colectomy WITH COLOSTOMY     Patient location during evaluation: PACU Anesthesia Type: General Level of consciousness: awake and alert Pain management: pain level controlled Vital Signs Assessment: post-procedure vital signs reviewed and stable Respiratory status: spontaneous breathing, nonlabored ventilation, respiratory function stable and patient connected to nasal cannula oxygen Cardiovascular status: blood pressure returned to baseline and stable Postop Assessment: no apparent nausea or vomiting Anesthetic complications: no  No notable events documented.  Last Vitals:  Vitals:   08/07/22 1951 08/07/22 1951  BP: 138/81 138/81  Pulse: 88 90  Resp: 16 16  Temp: 36.9 C 36.9 C  SpO2: 100% 100%    Last Pain:  Vitals:   08/07/22 1951  TempSrc: Oral  PainSc:                  Trevor Iha

## 2022-08-07 NOTE — Anesthesia Procedure Notes (Signed)
Procedure Name: Intubation Date/Time: 08/07/2022 12:00 PM  Performed by: Vanessa Lantana, CRNAPre-anesthesia Checklist: Patient identified, Emergency Drugs available, Suction available and Patient being monitored Patient Re-evaluated:Patient Re-evaluated prior to induction Oxygen Delivery Method: Circle system utilized Preoxygenation: Pre-oxygenation with 100% oxygen Induction Type: IV induction Ventilation: Mask ventilation without difficulty Laryngoscope Size: 2 and Miller Grade View: Grade I Tube type: Oral Tube size: 7.0 mm Number of attempts: 1 Airway Equipment and Method: Stylet Placement Confirmation: ETT inserted through vocal cords under direct vision, positive ETCO2 and breath sounds checked- equal and bilateral Secured at: 21 cm Tube secured with: Tape Dental Injury: Teeth and Oropharynx as per pre-operative assessment

## 2022-08-07 NOTE — Transfer of Care (Signed)
Immediate Anesthesia Transfer of Care Note  Patient: Sarah Moore  Procedure(s) Performed: EXPLORATORY LAPAROTOMY, sigmoid colectomy WITH COLOSTOMY  Patient Location: PACU  Anesthesia Type:General  Level of Consciousness: drowsy  Airway & Oxygen Therapy: Patient Spontanous Breathing and Patient connected to face mask  Post-op Assessment: Report given to RN and Post -op Vital signs reviewed and stable  Post vital signs: Reviewed and stable  Last Vitals:  Vitals Value Taken Time  BP 148/90 08/07/22 1324  Temp 35.8 C 08/07/22 1324  Pulse 73 08/07/22 1330  Resp 18 08/07/22 1330  SpO2 100 % 08/07/22 1330    Last Pain:  Vitals:   08/07/22 1014  TempSrc:   PainSc: 0-No pain         Complications: No notable events documented.

## 2022-08-07 NOTE — Consult Note (Signed)
WOC Nurse requested for preoperative stoma site marking  Discussed surgical procedure and stoma creation with patient.  Explained role of the WOC nurse team.  Provided the patient with educational booklet and provided samples of pouching options.  Answered patient  questions.   Examined patient lying, sitting, and standing in order to place the marking in the patient's visual field, away from any creases or abdominal contour issues and within the rectus muscle.  Attempted to mark below the patient's belt line.   Marked for colostomy in the LLQ  5 cm to the left of the umbilicus and 4.5cm below the umbilicus.  Marked for ileostomy in the RLQ  4 cm to the right of the umbilicus and  4.5 cm below the umbilicus.  Patient has significant crease when sitting right at level of umbilicus, this area was marked in hopes surgeon can avoid ostomy in this area.  Patient's abdomen cleansed with CHG wipes at site markings, allowed to air dry prior to marking.Covered mark with thin film transparent dressing to preserve mark until date of surgery.   WOC Nurse team will follow up with patient after surgery for continue ostomy care and teaching.   Thank you    Priscella Mann MSN, RN-BC, [employer] [phone]

## 2022-08-08 ENCOUNTER — Encounter (HOSPITAL_COMMUNITY): Payer: Self-pay | Admitting: General Surgery

## 2022-08-08 DIAGNOSIS — K561 Intussusception: Secondary | ICD-10-CM | POA: Diagnosis not present

## 2022-08-08 LAB — CBC
HCT: 30.4 % — ABNORMAL LOW (ref 36.0–46.0)
Hemoglobin: 9.8 g/dL — ABNORMAL LOW (ref 12.0–15.0)
MCH: 29.1 pg (ref 26.0–34.0)
MCHC: 32.2 g/dL (ref 30.0–36.0)
MCV: 90.2 fL (ref 80.0–100.0)
Platelets: 296 10*3/uL (ref 150–400)
RBC: 3.37 MIL/uL — ABNORMAL LOW (ref 3.87–5.11)
RDW: 13.6 % (ref 11.5–15.5)
WBC: 11.6 10*3/uL — ABNORMAL HIGH (ref 4.0–10.5)
nRBC: 0 % (ref 0.0–0.2)

## 2022-08-08 LAB — COMPREHENSIVE METABOLIC PANEL
ALT: 9 U/L (ref 0–44)
AST: 14 U/L — ABNORMAL LOW (ref 15–41)
Albumin: 2.8 g/dL — ABNORMAL LOW (ref 3.5–5.0)
Alkaline Phosphatase: 51 U/L (ref 38–126)
Anion gap: 14 (ref 5–15)
BUN: <5 mg/dL — ABNORMAL LOW (ref 8–23)
CO2: 22 mmol/L (ref 22–32)
Calcium: 9.2 mg/dL (ref 8.9–10.3)
Chloride: 100 mmol/L (ref 98–111)
Creatinine, Ser: 0.55 mg/dL (ref 0.44–1.00)
GFR, Estimated: >60 mL/min (ref >60)
Glucose, Bld: 108 mg/dL — ABNORMAL HIGH (ref 70–99)
Potassium: 3.4 mmol/L — ABNORMAL LOW (ref 3.5–5.1)
Sodium: 136 mmol/L (ref 135–145)
Total Bilirubin: 1.3 mg/dL — ABNORMAL HIGH (ref 0.3–1.2)
Total Protein: 6.3 g/dL — ABNORMAL LOW (ref 6.5–8.1)

## 2022-08-08 LAB — MAGNESIUM: Magnesium: 2 mg/dL (ref 1.7–2.4)

## 2022-08-08 MED ORDER — POTASSIUM CHLORIDE 20 MEQ PO PACK
40.0000 meq | PACK | Freq: Once | ORAL | Status: AC
  Administered 2022-08-08: 40 meq via ORAL
  Filled 2022-08-08: qty 2

## 2022-08-08 NOTE — Progress Notes (Signed)
  1 Day Post-Op   Chief Complaint/Subjective: Minimal pain, tolerated some liquid this morning  Objective: Vital signs in last 24 hours: Temp:  [96.5 F (35.8 C)-98.7 F (37.1 C)] 98.1 F (36.7 C) (04/24 0600) Pulse Rate:  [70-90] 86 (04/24 0600) Resp:  [15-20] 15 (04/24 0600) BP: (121-169)/(77-90) 134/83 (04/24 0600) SpO2:  [96 %-100 %] 98 % (04/24 0600) Weight:  [52.2 kg] 52.2 kg (04/23 1014)   Intake/Output from previous day: 04/23 0701 - 04/24 0700 In: 1300.3 [P.O.:100; I.V.:1000.3; IV Piggyback:200] Out: 1720 [Urine:1700; Blood:20]  PE: Gen: NAD Resp: nonlabored Card: RRR Abd: soft, incision c/d/I, ostomy red without drainage  Lab Results:  Recent Labs    08/06/22 1230 08/08/22 0536  WBC 7.1 11.6*  HGB 10.9* 9.8*  HCT 33.8* 30.4*  PLT 303 296   Recent Labs    08/07/22 0653 08/08/22 0536  NA 138 136  K 3.3* 3.4*  CL 103 100  CO2 24 22  GLUCOSE 76 108*  BUN <5* <5*  CREATININE 0.45 0.55  CALCIUM 8.4* 9.2   No results for input(s): "LABPROT", "INR" in the last 72 hours.    Component Value Date/Time   NA 136 08/08/2022 0536   NA 144 10/11/2008 1245   K 3.4 (L) 08/08/2022 0536   K 4.4 10/11/2008 1245   CL 100 08/08/2022 0536   CL 105 10/11/2008 1245   CO2 22 08/08/2022 0536   CO2 28 10/11/2008 1245   GLUCOSE 108 (H) 08/08/2022 0536   GLUCOSE 123 (H) 10/11/2008 1245   BUN <5 (L) 08/08/2022 0536   BUN 15 10/11/2008 1245   CREATININE 0.55 08/08/2022 0536   CREATININE 0.6 10/11/2008 1245   CALCIUM 9.2 08/08/2022 0536   CALCIUM 10.3 10/11/2008 1245   PROT 6.3 (L) 08/08/2022 0536   PROT 7.6 10/11/2008 1245   ALBUMIN 2.8 (L) 08/08/2022 0536   ALBUMIN 3.2 (L) 10/11/2008 1245   AST 14 (L) 08/08/2022 0536   AST 29 10/11/2008 1245   ALT 9 08/08/2022 0536   ALT 39 10/11/2008 1245   ALKPHOS 51 08/08/2022 0536   ALKPHOS 48 10/11/2008 1245   BILITOT 1.3 (H) 08/08/2022 0536   BILITOT 1.40 10/11/2008 1245   GFRNONAA >60 08/08/2022 0536   GFRAA >60  12/17/2019 1530    Assessment/Plan  s/p Procedure(s): EXPLORATORY LAPAROTOMY, sigmoid colectomy WITH COLOSTOMY 08/07/2022   FEN - clear liquids VTE - lovenox ID - periop abx Disposition - inpatient, PT eval, remove foley   LOS: 2 days   I reviewed last 24 h vitals and pain scores, last 48 h intake and output, last 24 h labs and trends, and last 24 h imaging results.  This care required moderate level of medical decision making.   De Blanch [facility] 08/08/2022, 9:44 AM Please see Amion for pager number during day hours 7:00am-4:30pm or 7:00am -11:30am on weekends

## 2022-08-08 NOTE — TOC Progression Note (Signed)
Transition of Care [facility]) - Progression Note    Patient Details  Name: Sarah Moore MRN: [id number] Date of Birth: 03/15/1951  Transition of Care [facility]) CM/SW Contact  Huston Foley Jacklynn Ganong, RN Phone Number: 08/08/2022, 2:19 PM  Clinical Narrative:    Patient is a 72 yr old female, s/p exploratory lap, colectomy with colostomy.  Case manager spoke with patient concerning recommendation for Home Health services. Patient has no agency preference, agreeable with referral being called to [facility]. Patient state she lives alone but has a daughter and niece that will be assisting as needed. Youth walker has been requested from [facility], will b e delivered to patient room.    Expected Discharge Plan: Home w Home Health Services Barriers to Discharge: Continued Medical Work up  Expected Discharge Plan and Services   Discharge Planning Services: CM Consult Post Acute Care Choice: Durable Medical Equipment, Home Health Living arrangements for the past 2 months: Apartment                 DME Arranged: Dan Humphreys youth DME Agency: [employer] Date DME Agency Contacted: 08/08/22 Time DME Agency Contacted: [phone] Representative spoke with at DME Agency: Shaune Leeks HH Arranged: RN, PT [facility] Agency: [facility] Date [facility] Agency Contacted: 08/08/22 Time HH Agency Contacted: [phone] Representative spoke with at [facility] Agency: Ephriam Knuckles   Social Determinants of Health (SDOH) Interventions SDOH Screenings   Food Insecurity: No Food Insecurity (08/06/2022)  Housing: Low Risk  (08/06/2022)  Transportation Needs: No Transportation Needs (08/06/2022)  Utilities: Not At Risk (08/06/2022)  Tobacco Use: Medium Risk (08/08/2022)    Readmission Risk Interventions    08/07/2022   10:37 AM  Readmission Risk Prevention Plan  Post Dischage Appt Complete  Medication Screening Complete  Transportation Screening Complete

## 2022-08-08 NOTE — Evaluation (Signed)
Physical Therapy Evaluation Patient Details Name: Sarah Moore MRN: [id number] DOB: 12-16-1950 Today's Date: 08/08/2022  History of Present Illness  72 y.o. female with past medical history significant for hyperlipidemia, chronic constipation, history of remote DVT not on anticoagulation who presented to [facility] ED on 4/22 with complaints of abdominal pain. Dx of colonic intussuseption. s/p partial colectomy with end colostomy 08/07/22.  Clinical Impression  Pt admitted with above diagnosis. Pt was rather lethargic during PT session. Min assist for log roll and sidelying to sit. Min assist to stand, pt took several steps to recliner with RW. Mobility limited by lethargy.  Pt currently with functional limitations due to the deficits listed below (see PT Problem List). Pt will benefit from acute skilled PT to increase their independence and safety with mobility to allow discharge.          Recommendations for follow up therapy are one component of a multi-disciplinary discharge planning process, led by the attending physician.  Recommendations may be updated based on patient status, additional functional criteria and insurance authorization.  Follow Up Recommendations       Assistance Recommended at Discharge Intermittent Supervision/Assistance  Patient can return home with the following  A little help with walking and/or transfers;A little help with bathing/dressing/bathroom;Assistance with cooking/housework;Assist for transportation;Direct supervision/assist for medications management;Help with stairs or ramp for entrance    Equipment Recommendations Rolling walker (2 wheels)  Recommendations for Other Services       Functional Status Assessment Patient has had a recent decline in their functional status and demonstrates the ability to make significant improvements in function in a reasonable and predictable amount of time.     Precautions / Restrictions  Precautions Precautions: Fall Precaution Comments: abdominal surgery; instructed pt in log roll Restrictions Weight Bearing Restrictions: No      Mobility  Bed Mobility Overal bed mobility: Needs Assistance Bed Mobility: Rolling, Sidelying to Sit Rolling: Min assist Sidelying to sit: Min assist       General bed mobility comments: VCs for log roll technique, min A to initiate roll and to raise trunk, increased time    Transfers Overall transfer level: Needs assistance Equipment used: Rolling walker (2 wheels) Transfers: Sit to/from Stand Sit to Stand: Min guard           General transfer comment: increased time, VCs hand placement    Ambulation/Gait Ambulation/Gait assistance: Min guard Gait Distance (Feet): 5 Feet Assistive device: Rolling walker (2 wheels) Gait Pattern/deviations: Step-through pattern, Decreased stride length Gait velocity: decr     General Gait Details: slow but steady, distance limited by lethargy  Stairs            Wheelchair Mobility    Modified Rankin (Stroke Patients Only)       Balance Overall balance assessment: Modified Independent                                           Pertinent Vitals/Pain Pain Assessment Pain Assessment: 0-10 Pain Score: 4  Pain Location: abdomen Pain Descriptors / Indicators: Grimacing, Guarding Pain Intervention(s): Limited activity within patient's tolerance, Monitored during session, Patient requesting pain meds-RN notified, RN gave pain meds during session    Home Living Family/patient expects to be discharged to:: Private residence Living Arrangements: Alone Available Help at Discharge: Family;Available PRN/intermittently   Home Access: Level entry  Home Layout: One level Home Equipment: Cane - quad      Prior Function Prior Level of Function : Independent/Modified Independent             Mobility Comments: no falls in past 6 months ADLs Comments:  independent     Hand Dominance        Extremity/Trunk Assessment   Upper Extremity Assessment Upper Extremity Assessment: Overall WFL for tasks assessed    Lower Extremity Assessment Lower Extremity Assessment: Overall WFL for tasks assessed    Cervical / Trunk Assessment Cervical / Trunk Assessment: Normal  Communication   Communication: No difficulties  Cognition Arousal/Alertness: Lethargic, Suspect due to medications Behavior During Therapy: WFL for tasks assessed/performed Overall Cognitive Status: Within Functional Limits for tasks assessed                                          General Comments      Exercises     Assessment/Plan    PT Assessment Patient needs continued PT services  PT Problem List Decreased activity tolerance;Decreased mobility;Decreased balance       PT Treatment Interventions DME instruction;Patient/family education;Gait training;Therapeutic exercise    PT Goals (Current goals can be found in the Care Plan section)  Acute Rehab PT Goals Patient Stated Goal: return home PT Goal Formulation: With patient Time For Goal Achievement: 08/22/22 Potential to Achieve Goals: Good    Frequency Min 1X/week     Co-evaluation               AM-PAC PT "6 Clicks" Mobility  Outcome Measure Help needed turning from your back to your side while in a flat bed without using bedrails?: A Little Help needed moving from lying on your back to sitting on the side of a flat bed without using bedrails?: A Little Help needed moving to and from a bed to a chair (including a wheelchair)?: A Little Help needed standing up from a chair using your arms (e.g., wheelchair or bedside chair)?: A Little Help needed to walk in hospital room?: A Little Help needed climbing 3-5 steps with a railing? : A Lot 6 Click Score: 17    End of Session Equipment Utilized During Treatment: Gait belt Activity Tolerance: Patient limited by  lethargy;Patient limited by pain Patient left: in chair;with call bell/phone within reach Nurse Communication: Mobility status PT Visit Diagnosis: Difficulty in walking, not elsewhere classified (R26.2);Pain    Time: 1140-1159 PT Time Calculation (min) (ACUTE ONLY): 19 min   Charges:   PT Evaluation $PT Eval Moderate Complexity: 1 Mod         Tamala Ser PT 08/08/2022  Acute Rehabilitation Services  Office [phone]

## 2022-08-08 NOTE — Progress Notes (Signed)
PROGRESS NOTE    Sarah Moore  WUJ:[id number] DOB: 09-Mar-1951 DOA: 08/06/2022 PCP: Norm Salt, PA    Brief Narrative:   Sarah Moore is a 72 y.o. female with past medical history significant for hyperlipidemia, chronic constipation, history of remote DVT not on anticoagulation who presented to [facility] ED on 4/22 with complaints of abdominal pain.  Patient reports last bowel movement 1.5 weeks ago.  Also endorses loss of appetite.  Patient was seen by urgent care 2 days prior to admission and was given Linzess and Fleet enema without improvement.  Denies nausea or vomiting.  No previous history of abdominal surgery.  Patient follows with GI outpatient, Dr. Elnoria Howard.  Recent flex sigmoidoscopy 4/9 that was reported as normal and colonoscopy February 2024 with only polyps noted.  At that time patient was advised to start taking lactulose.  In the ED, temperature 98.1 F, HR 85, RR 16, BP 123/106, SpO2 98% on room air.  WBC 7.1, hemoglobin 10.9, platelets 303.  Sodium 138, potassium 2.5, chloride 100, CO2 28, glucose 96, BUN 8, creatinine 0.36.  Lipase 24.  AST 14, ALT 10, total bili 1.3.  Urinalysis unrevealing.  CT abdomen/pelvis with colocolonic intussusception at the junction of the descending and sigmoid colon likely due to a large intraluminal lipoma serving as a lead point, mild mural thickening of the into the septum without frank evidence of bowel ischemia concerning for partial functional colonic obstruction with moderate gaseous distention of the proximal colon.  EDP discussed with GI, Dr. Loreta Ave who recommended general surgery consult.  Dr. Luisa Hart was consulted who advised maintain n.p.o. status and recommended hospitalist admission.  TRH consulted for admission for further evaluation management of large bowel obstruction.  Assessment & Plan:   Large bowel obstruction 2/2 intraluminal colonic mass s/p Ex lap with partial colectomy and end colostomy The patient presented to  ED with persistent abdominal pain.  History of chronic constipation, last reported bowel movement 1.5 weeks prior to admission.  Follows with GI outpatient, Dr. Elnoria Howard.  Patient is afebrile without leukocytosis.  CT abdomen/pelvis on admission with findings of colo-colonic intussusception at the junction of the descending and sigmoid colon likely due to a large intraluminal lipoma serving as a lead point, mild mural thickening of the into the septum without frank evidence of bowel ischemia concerning for partial functional colonic obstruction with moderate gaseous distention of the proximal colon.  General surgery was consulted and patient underwent ex lap with partial colectomy with end colostomy by Dr. Alvan Dame on 08/07/2022.  Findings of large intraluminal mass of the distal left colon -- General Surgery following, appreciate assistance -- Clear liquid diet -- Surgical pathology: Pending -- Further per general surgery  Hypokalemia Potassium 2.5 on admission, likely secondary to poor oral intake in the days preceding hospitalization.  Repleted. -- Repeat potassium this morning, 3.4, will continue repletion. -- Repeat BMP in a.m. to include magnesium  Hyperlipidemia Currently not on statin outpatient.   DVT prophylaxis: enoxaparin (LOVENOX) injection 30 mg Start: 08/08/22 1000 SCDs Start: 08/06/22 1950    Code Status: Full Code Family Communication: No family present at bedside this morning  Disposition Plan:  Level of care: Med-Surg Status is: Inpatient Remains inpatient appropriate because: Needs further advancement and bowel functions return before stable for discharge home    Consultants:  General surgery  Procedures:  Ex lap with partial colectomy and end colostomy, Dr. Sheliah Hatch 4/23  Antimicrobials:  Perioperative cefotetan   Subjective: Patient seen examined  bedside, lying in bed.  Abdominal pain much improved today.  Underwent ex lap with partial colectomy and colostomy  yesterday by general surgery.  Denies flatus.  Seen by general surgery's morning, started on a clear liquid diet.  No other specific questions or concerns at this time.  Denies headache, no dizziness, no chest pain, no palpitations, no shortness of breath, no fever/chills/night sweats, no nausea/vomiting/diarrhea, no focal weakness, no fatigue, no paresthesias.  No acute events overnight per nursing.  Objective: Vitals:   08/07/22 1732 08/07/22 1951 08/07/22 1951 08/08/22 0600  BP: (!) 142/90 138/81 138/81 134/83  Pulse: 83 88 90 86  Resp: 17 16 16 15   Temp: 98.7 F (37.1 C) 98.4 F (36.9 C) 98.4 F (36.9 C) 98.1 F (36.7 C)  TempSrc: Oral Oral Oral Oral  SpO2: 100% 100% 100% 98%  Weight:      Height:        Intake/Output Summary (Last 24 hours) at 08/08/2022 1324 Last data filed at 08/08/2022 1000 Gross per 24 hour  Intake 518.34 ml  Output 1700 ml  Net -1181.66 ml   Filed Weights   08/06/22 1020 08/07/22 1014  Weight: 52.2 kg 52.2 kg    Examination:  Physical Exam: GEN: NAD, alert and oriented x 3, elderly in appearance HEENT: NCAT, PERRL, EOMI, sclera clear, MMM PULM: CTAB w/o wheezes/crackles, normal respiratory effort, on room air CV: RRR w/o M/G/R GI: abd soft, noted colostomy without drainage, surgical incision with honeycomb in place, clean/dry/intact, faint bowel sounds.   MSK: no peripheral edema, moves all extremities independently with preserved muscle strength NEURO: No focal neurological deficits appreciated PSYCH: normal mood/affect Integumentary: Noted colostomy with midline abdominal surgical incision as above, otherwise no other concerning rashes/lesions/wounds noted on exposed skin surfaces    Data Reviewed: I have personally reviewed following labs and imaging studies  CBC: Recent Labs  Lab 08/02/22 1033 08/06/22 1230 08/08/22 0536  WBC 6.4 7.1 11.6*  NEUTROABS 3.1 4.0  --   HGB 11.8* 10.9* 9.8*  HCT 35.8* 33.8* 30.4*  MCV 88.2 90.6 90.2   PLT 254 303 296   Basic Metabolic Panel: Recent Labs  Lab 08/02/22 1033 08/06/22 1230 08/07/22 0653 08/08/22 0536  NA 141 138 138 136  K 3.1* 2.5* 3.3* 3.4*  CL 102 100 103 100  CO2 27 28 24 22   GLUCOSE 86 96 76 108*  BUN 8 8 <5* <5*  CREATININE 0.55 0.36* 0.45 0.55  CALCIUM 9.8 9.1 8.4* 9.2  MG  --   --   --  2.0   GFR: Estimated Creatinine Clearance: 47.7 mL/min (by C-G formula based on SCr of 0.55 mg/dL). Liver Function Tests: Recent Labs  Lab 08/06/22 1230 08/08/22 0536  AST 14* 14*  ALT 10 9  ALKPHOS 55 51  BILITOT 1.3* 1.3*  PROT 7.2 6.3*  ALBUMIN 3.3* 2.8*   Recent Labs  Lab 08/06/22 1230  LIPASE 24   No results for input(s): "AMMONIA" in the last 168 hours. Coagulation Profile: No results for input(s): "INR", "PROTIME" in the last 168 hours. Cardiac Enzymes: No results for input(s): "CKTOTAL", "CKMB", "CKMBINDEX", "TROPONINI" in the last 168 hours. BNP (last 3 results) No results for input(s): "PROBNP" in the last 8760 hours. HbA1C: No results for input(s): "HGBA1C" in the last 72 hours. CBG: No results for input(s): "GLUCAP" in the last 168 hours. Lipid Profile: No results for input(s): "CHOL", "HDL", "LDLCALC", "TRIG", "CHOLHDL", "LDLDIRECT" in the last 72 hours. Thyroid Function Tests: No results  for input(s): "TSH", "T4TOTAL", "FREET4", "T3FREE", "THYROIDAB" in the last 72 hours. Anemia Panel: No results for input(s): "VITAMINB12", "FOLATE", "FERRITIN", "TIBC", "IRON", "RETICCTPCT" in the last 72 hours. Sepsis Labs: No results for input(s): "PROCALCITON", "LATICACIDVEN" in the last 168 hours.  No results found for this or any previous visit (from the past 240 hour(s)).       Radiology Studies: CT ABDOMEN PELVIS W CONTRAST  Result Date: 08/06/2022 CLINICAL DATA:  Hematuria, suspected bowel obstruction EXAM: CT ABDOMEN AND PELVIS WITH CONTRAST TECHNIQUE: Multidetector CT imaging of the abdomen and pelvis was performed using the standard  protocol following bolus administration of intravenous contrast. RADIATION DOSE REDUCTION: This exam was performed according to the departmental dose-optimization program which includes automated exposure control, adjustment of the mA and/or kV according to patient size and/or use of iterative reconstruction technique. CONTRAST:  80mL OMNIPAQUE IOHEXOL 300 MG/ML  SOLN COMPARISON:  08/02/2022, 07/02/2012 FINDINGS: Lower chest: No acute pleural or parenchymal lung disease. Hepatobiliary: Involution of the cavernous hemangioma seen within the left lobe liver on prior exam. This now measures up to 2.3 cm in maximal dimension, previously measuring 5.6 cm. No acute liver abnormality. The gallbladder is unremarkable. No biliary duct dilation. Pancreas: Moderate pancreatic atrophy. No acute inflammatory changes or pancreatic duct dilation. Spleen: Normal in size without focal abnormality. Adrenals/Urinary Tract: The kidneys enhance normally and symmetrically. No urinary tract calculi or obstructive uropathy. Bladder is decompressed, the bladder is minimally distended, without focal abnormality. The adrenals are unremarkable. Stomach/Bowel: There is a colo-colic intussusception beginning at the junction of the descending and sigmoid colon. Lead point is likely a large intraluminal lipoma, which has increased in size since prior study measuring 6.4 x 4.5 cm on today's exam. There is mild mural thickening of the intussusceptum, without frank evidence of bowel ischemia. Moderate gaseous distension of the colon proximal to the intussusception could reflect partial functional colonic obstruction. Gas is seen within the rectal vault. There is no evidence of small-bowel obstruction. Normal appendix right lower quadrant. Vascular/Lymphatic: There is marked eccentric atheromatous plaque within the aorta at the diaphragmatic hiatus, with approximately 50% stenosis in that region. Atherosclerosis throughout the remainder of the  visualized thoracoabdominal aorta without evidence of aneurysm or dissection. No pathologic adenopathy. Reproductive: Status post hysterectomy. No adnexal masses. Other: No free fluid or free intraperitoneal gas. No abdominal wall hernia. Musculoskeletal: There are no acute or destructive bony lesions. Reconstructed images demonstrate no additional findings. IMPRESSION: 1. Colo-colic intussusception at the junction of the descending and sigmoid colon, likely due to a large intraluminal lipoma serving as the lead point. There is mild mural thickening of the intussusceptum, without frank evidence of bowel ischemia. Likely partial functional colonic obstruction with moderate gaseous distension of the proximal colon. Gas is seen within the rectal vault. Surgical consultation recommended. 2. Aortic Atherosclerosis (ICD10-I70.0). Moderate eccentric plaque located at the diaphragmatic hiatus, with approximately 50% narrowing of the aortic lumen in that region. 3. Significant involution of the cavernous hemangioma within the left lobe liver as above. Critical Value/emergent results were called by telephone at the time of interpretation on 08/06/2022 at 5:06 pm to provider [facility] , who verbally acknowledged these results. Electronically Signed   By: Sharlet Salina M.D.   On: 08/06/2022 17:08        Scheduled Meds:  acetaminophen  1,000 mg Oral Q6H   enoxaparin (LOVENOX) injection  30 mg Subcutaneous Q12H   methocarbamol  500 mg Oral TID   Continuous Infusions:  sodium  chloride Stopped (08/07/22 1738)   lactated ringers 75 mL/hr at 08/07/22 1444     LOS: 2 days    Time spent: 52 minutes spent on chart review, discussion with nursing staff, consultants, updating family and interview/physical exam; more than 50% of that time was spent in counseling and/or coordination of care.    Alvira Philips [place], DO [facility] Available via Epic secure chat 7am-7pm After these hours, please refer to  coverage provider listed on [email or web address] 08/08/2022, 1:24 PM

## 2022-08-09 DIAGNOSIS — K561 Intussusception: Secondary | ICD-10-CM | POA: Diagnosis not present

## 2022-08-09 LAB — CBC
HCT: 28.8 % — ABNORMAL LOW (ref 36.0–46.0)
Hemoglobin: 9.5 g/dL — ABNORMAL LOW (ref 12.0–15.0)
MCH: 29.6 pg (ref 26.0–34.0)
MCHC: 33 g/dL (ref 30.0–36.0)
MCV: 89.7 fL (ref 80.0–100.0)
Platelets: 311 10*3/uL (ref 150–400)
RBC: 3.21 MIL/uL — ABNORMAL LOW (ref 3.87–5.11)
RDW: 13.5 % (ref 11.5–15.5)
WBC: 8.3 10*3/uL (ref 4.0–10.5)
nRBC: 0 % (ref 0.0–0.2)

## 2022-08-09 LAB — BASIC METABOLIC PANEL
Anion gap: 11 (ref 5–15)
BUN: 8 mg/dL (ref 8–23)
CO2: 26 mmol/L (ref 22–32)
Calcium: 8.8 mg/dL — ABNORMAL LOW (ref 8.9–10.3)
Chloride: 99 mmol/L (ref 98–111)
Creatinine, Ser: 0.45 mg/dL (ref 0.44–1.00)
GFR, Estimated: >60 mL/min (ref >60)
Glucose, Bld: 176 mg/dL — ABNORMAL HIGH (ref 70–99)
Potassium: 2.6 mmol/L — CL (ref 3.5–5.1)
Sodium: 136 mmol/L (ref 135–145)

## 2022-08-09 LAB — SURGICAL PATHOLOGY

## 2022-08-09 LAB — MAGNESIUM: Magnesium: 2 mg/dL (ref 1.7–2.4)

## 2022-08-09 MED ORDER — POTASSIUM CHLORIDE 20 MEQ PO PACK
20.0000 meq | PACK | ORAL | Status: AC
  Administered 2022-08-09 (×4): 20 meq via ORAL
  Filled 2022-08-09 (×4): qty 1

## 2022-08-09 MED ORDER — POTASSIUM CHLORIDE 20 MEQ PO PACK: 80.0000 meq | PACK | Freq: Once | ORAL | Status: DC

## 2022-08-09 MED ORDER — POTASSIUM CHLORIDE 10 MEQ/100ML IV SOLN
10.0000 meq | Freq: Once | INTRAVENOUS | Status: AC
  Administered 2022-08-09: 10 meq via INTRAVENOUS
  Filled 2022-08-09: qty 100

## 2022-08-09 NOTE — Progress Notes (Signed)
  2 Days Post-Op   Chief Complaint/Subjective: Drank some clear liquids yesterday and had significant nausea overnight. No vomiting. Still nauseas this morning though antiemetics help. Having mild abdominal pain. No output into ostomy. Voiding after foley removal  Objective: Vital signs in last 24 hours: Temp:  [97.4 F (36.3 C)-98.4 F (36.9 C)] 97.9 F (36.6 C) (04/25 0502) Pulse Rate:  [72-82] 72 (04/25 0502) Resp:  [16-18] 18 (04/25 0502) BP: (94-133)/(55-85) 94/55 (04/25 0502) SpO2:  [97 %-100 %] 97 % (04/25 0502)   Intake/Output from previous day: 04/24 0701 - 04/25 0700 In: 318 [P.O.:318] Out: 0   PE: Gen: NAD Resp: nonlabored on room air Abd: soft, incision c/d/I, stoma red - no drainage  Lab Results:  Recent Labs    08/08/22 0536 08/09/22 0537  WBC 11.6* 8.3  HGB 9.8* 9.5*  HCT 30.4* 28.8*  PLT 296 311    Recent Labs    08/08/22 0536 08/09/22 0537  NA 136 136  K 3.4* 2.6*  CL 100 99  CO2 22 26  GLUCOSE 108* 176*  BUN <5* 8  CREATININE 0.55 0.45  CALCIUM 9.2 8.8*    No results for input(s): "LABPROT", "INR" in the last 72 hours.    Component Value Date/Time   NA 136 08/09/2022 0537   NA 144 10/11/2008 1245   K 2.6 (LL) 08/09/2022 0537   K 4.4 10/11/2008 1245   CL 99 08/09/2022 0537   CL 105 10/11/2008 1245   CO2 26 08/09/2022 0537   CO2 28 10/11/2008 1245   GLUCOSE 176 (H) 08/09/2022 0537   GLUCOSE 123 (H) 10/11/2008 1245   BUN 8 08/09/2022 0537   BUN 15 10/11/2008 1245   CREATININE 0.45 08/09/2022 0537   CREATININE 0.6 10/11/2008 1245   CALCIUM 8.8 (L) 08/09/2022 0537   CALCIUM 10.3 10/11/2008 1245   PROT 6.3 (L) 08/08/2022 0536   PROT 7.6 10/11/2008 1245   ALBUMIN 2.8 (L) 08/08/2022 0536   ALBUMIN 3.2 (L) 10/11/2008 1245   AST 14 (L) 08/08/2022 0536   AST 29 10/11/2008 1245   ALT 9 08/08/2022 0536   ALT 39 10/11/2008 1245   ALKPHOS 51 08/08/2022 0536   ALKPHOS 48 10/11/2008 1245   BILITOT 1.3 (H) 08/08/2022 0536   BILITOT 1.40  10/11/2008 1245   GFRNONAA >60 08/09/2022 0537   GFRAA >60 12/17/2019 1530    Assessment/Plan Colonic intussusception POD2 s/p EXPLORATORY LAPAROTOMY, sigmoid colectomy WITH COLOSTOMY 08/07/2022 Dr. Sheliah Hatch  - surgical path pending - nausea with CLD and no bowel function. Back down to sips of clears - encouraged ambulation  FEN - sips of clears, hypokalemia - IV and PO K VTE - lovenox ID - periop abx Foley - periop. Dc 4/24, voiding  Disposition - inpatient, PT eval   LOS: 3 days   Eric Form, [facility] 08/09/2022, 9:04 AM Please see Amion for pager number during day hours 7:00am-4:30pm

## 2022-08-09 NOTE — Consult Note (Addendum)
WOC Nurse ostomy consult note; exp lap sigmoid colectomy w/end colostomy 08/07/2022 Dr. Sheliah Hatch  Stoma type/location: LUQ end colostomy  Stomal assessment/size: 7/8", oval, retracted, red moist  Peristomal assessment: patient with dip in skin at 9 o'clock; midline incision to right of ostomy   Treatment options for stomal/peristomal skin: 2" barrier ring  Output minimal bloody effluent in bag  Ostomy pouching: 1pc convex Hart [place] [phone] Education provided: Patient is awake, states she is cold but did engage in teaching session.  Discussed with patient the need to empty pouch when 1/3 to 1/2 full. Patient was able to perform emptying of pouch (simulated as nothing in pouch) with lock and roll mechanism and utilized toilet paper wick to clean spout. I did demonstrate to patient how to remove her current pouching system with a push and pull technique.  We discussed that the pouching system should be changed 2 times weekly and if leakage occurs.  Demonstrated to patient how to measure stoma utilizing stoma sizing guide, stoma measures at 7/8" today.  Discussed how size may change as edema subsides and stoma should be sized with each pouch change for the first 4 weeks.  Demonstrated to patient how to cut skin barrier on one piece pouch to fit stoma.  Discussed use of 2" barrier ring to help fill in crease at 9 o'clock and provide extra skin protection around stoma.  Placed 2" skin barrier and new one piece ostomy pouch as well.  Patient was engaged at beginning of session, less so as session went on. Patient states "they will have to get someone to come to my house to take care of this".  I educated patient that even if she does have home health they will be coming in to teach her how to care for her own ostomy.  I explained our role as ostomy nurses was to help her learn how to manage her own ostomy. I did ask patient if there were a family member or friend we could meet for education as well and she said no.     We discussed that since her ostomy is retracted in a crease we will order an ostomy belt to help with securing pouch.  Also discussed and included information on ostomy clinic as patients ostomy may prove to be difficult to pouch.  Secure chat sent to [place], CCS PA regarding referral to ostomy clinic.    Reviewed educational materials in room with patient including handout that demonstrates step by step one piece pouching change.  Patient will need to perform pouch change independently at next visit.  Patient states she is very cold and prefers to stay wrapped up at all times.    Supplies ordered for room at this visit:  1 piece flexible convex 1 1/2" Hart [place] #[id number] 2" barrier ring Hart [place] [phone])  Stoma powder and no sting barrier wipes  Medium ostomy belt Hart [place] [phone])    Enrolled patient in [facility] DC program: Yes  WOC team will continue to follow for ostomy support and education.    Thank you,    Priscella Mann MSN, RN-BC, [employer] [phone]

## 2022-08-09 NOTE — Care Management Important Message (Signed)
Important Message  Patient Details IM Letter given. Name: Sarah Moore MRN: [id number] Date of Birth: 03/22/1951   Medicare Important Message Given:  Yes     Caren Macadam 08/09/2022, 10:01 AM

## 2022-08-09 NOTE — Progress Notes (Signed)
PROGRESS NOTE    Sarah Moore  FUX:[id number] DOB: 1950/12/02 DOA: 08/06/2022 PCP: Norm Salt, PA    Brief Narrative:   Sarah Moore is a 72 y.o. female with past medical history significant for hyperlipidemia, chronic constipation, history of remote DVT not on anticoagulation who presented to [facility] ED on 4/22 with complaints of abdominal pain.  Patient reports last bowel movement 1.5 weeks ago.  Also endorses loss of appetite.  Patient was seen by urgent care 2 days prior to admission and was given Linzess and Fleet enema without improvement.  Denies nausea or vomiting.  No previous history of abdominal surgery.  Patient follows with GI outpatient, Dr. Elnoria Howard.  Recent flex sigmoidoscopy 4/9 that was reported as normal and colonoscopy February 2024 with only polyps noted.  At that time patient was advised to start taking lactulose.  In the ED, temperature 98.1 F, HR 85, RR 16, BP 123/106, SpO2 98% on room air.  WBC 7.1, hemoglobin 10.9, platelets 303.  Sodium 138, potassium 2.5, chloride 100, CO2 28, glucose 96, BUN 8, creatinine 0.36.  Lipase 24.  AST 14, ALT 10, total bili 1.3.  Urinalysis unrevealing.  CT abdomen/pelvis with colocolonic intussusception at the junction of the descending and sigmoid colon likely due to a large intraluminal lipoma serving as a lead point, mild mural thickening of the into the septum without frank evidence of bowel ischemia concerning for partial functional colonic obstruction with moderate gaseous distention of the proximal colon.  EDP discussed with GI, Dr. Loreta Ave who recommended general surgery consult.  Dr. Luisa Hart was consulted who advised maintain n.p.o. status and recommended hospitalist admission.  TRH consulted for admission for further evaluation management of large bowel obstruction.  Assessment & Plan:   Large bowel obstruction 2/2 intraluminal colonic mass s/p Ex lap with partial colectomy and end colostomy The patient presented to  ED with persistent abdominal pain.  History of chronic constipation, last reported bowel movement 1.5 weeks prior to admission.  Follows with GI outpatient, Dr. Elnoria Howard.  Patient is afebrile without leukocytosis.  CT abdomen/pelvis on admission with findings of colo-colonic intussusception at the junction of the descending and sigmoid colon likely due to a large intraluminal lipoma serving as a lead point, mild mural thickening of the into the septum without frank evidence of bowel ischemia concerning for partial functional colonic obstruction with moderate gaseous distention of the proximal colon.  General surgery was consulted and patient underwent ex lap with partial colectomy with end colostomy by Dr. Alvan Dame on 08/07/2022.  Findings of large intraluminal mass of the distal left colon -- General Surgery following, appreciate assistance -- Sips of clears -- Surgical pathology: Pending -- Further per general surgery  Hypokalemia Potassium 2.5 on admission, likely secondary to poor oral intake in the days preceding hospitalization.  Repleted. -- Repeat potassium this morning, 2.6, will continue repletion with IV and oral potassium. -- Repeat BMP in a.m. to include magnesium  Hyperlipidemia Currently not on statin outpatient.   DVT prophylaxis: enoxaparin (LOVENOX) injection 30 mg Start: 08/08/22 1000 SCDs Start: 08/06/22 1950    Code Status: Full Code Family Communication: No family present at bedside this morning  Disposition Plan:  Level of care: Med-Surg Status is: Inpatient Remains inpatient appropriate because: Needs further advancement and bowel functions return before stable for discharge home    Consultants:  General surgery  Procedures:  Ex lap with partial colectomy and end colostomy, Dr. Sheliah Hatch 4/23  Antimicrobials:  Perioperative cefotetan  Subjective: Patient seen examined bedside, lying in bed.  Reports nausea and mild abdominal pain.  RN reports gas within  colostomy bag today.  No output otherwise in colostomy.  Seen by general surgery and backing down from clear liquid diet to sips of clears today.  Potassium low, will replete with IV/oral potassium.  No other specific questions or concerns at this time.  Denies headache, no dizziness, no chest pain, no palpitations, no shortness of breath, no fever/chills/night sweats, no nausea/vomiting/diarrhea, no focal weakness, no fatigue, no paresthesias.  No acute events overnight per nursing.  Objective: Vitals:   08/08/22 0600 08/08/22 1330 08/08/22 1937 08/09/22 0502  BP: 134/83 133/85 123/84 (!) 94/55  Pulse: 86 74 82 72  Resp: Temp: 98.1 F (36.7 C) (!) 97.4 F (36.3 C) 98.4 F (36.9 C) 97.9 F (36.6 C)  TempSrc: Oral Oral Oral Oral  SpO2: 98% 100% 97% 97%  Weight:      Height:        Intake/Output Summary (Last 24 hours) at 08/09/2022 1126 Last data filed at 08/09/2022 0551 Gross per 24 hour  Intake --  Output 0 ml  Net 0 ml   Filed Weights   08/06/22 1020 08/07/22 1014  Weight: 52.2 kg 52.2 kg    Examination:  Physical Exam: GEN: NAD, alert and oriented x 3, elderly in appearance HEENT: NCAT, PERRL, EOMI, sclera clear, MMM PULM: CTAB w/o wheezes/crackles, normal respiratory effort, on room air CV: RRR w/o M/G/R GI: abd soft, noted colostomy without output but with gas in bag, surgical incision with honeycomb in place, clean/dry/intact, faint bowel sounds.   MSK: no peripheral edema, moves all extremities independently with preserved muscle strength NEURO: No focal neurological deficits appreciated PSYCH: normal mood/affect Integumentary: Noted colostomy with midline abdominal surgical incision as above, otherwise no other concerning rashes/lesions/wounds noted on exposed skin surfaces    Data Reviewed: I have personally reviewed following labs and imaging studies  CBC: Recent Labs  Lab 08/06/22 1230 08/08/22 0536 08/09/22 0537  WBC 7.1 11.6* 8.3   NEUTROABS 4.0  --   --   HGB 10.9* 9.8* 9.5*  HCT 33.8* 30.4* 28.8*  MCV 90.6 90.2 89.7  PLT 303 296 311   Basic Metabolic Panel: Recent Labs  Lab 08/06/22 1230 08/07/22 0653 08/08/22 0536 08/09/22 0537  NA 138 138 136 136  K 2.5* 3.3* 3.4* 2.6*  CL 100 103 100 99  CO2 GLUCOSE 96 76 108* 176*  BUN 8 <5* <5* 8  CREATININE 0.36* 0.45 0.55 0.45  CALCIUM 9.1 8.4* 9.2 8.8*  MG  --   --  2.0 2.0   GFR: Estimated Creatinine Clearance: 47.7 mL/min (by C-G formula based on SCr of 0.45 mg/dL). Liver Function Tests: Recent Labs  Lab 08/06/22 1230 08/08/22 0536  AST 14* 14*  ALT 10 9  ALKPHOS 55 51  BILITOT 1.3* 1.3*  PROT 7.2 6.3*  ALBUMIN 3.3* 2.8*   Recent Labs  Lab 08/06/22 1230  LIPASE 24   No results for input(s): "AMMONIA" in the last 168 hours. Coagulation Profile: No results for input(s): "INR", "PROTIME" in the last 168 hours. Cardiac Enzymes: No results for input(s): "CKTOTAL", "CKMB", "CKMBINDEX", "TROPONINI" in the last 168 hours. BNP (last 3 results) No results for input(s): "PROBNP" in the last 8760 hours. HbA1C: No results for input(s): "HGBA1C" in the last 72 hours. CBG: No results for input(s): "GLUCAP" in the last 168 hours. Lipid  Profile: No results for input(s): "CHOL", "HDL", "LDLCALC", "TRIG", "CHOLHDL", "LDLDIRECT" in the last 72 hours. Thyroid Function Tests: No results for input(s): "TSH", "T4TOTAL", "FREET4", "T3FREE", "THYROIDAB" in the last 72 hours. Anemia Panel: No results for input(s): "VITAMINB12", "FOLATE", "FERRITIN", "TIBC", "IRON", "RETICCTPCT" in the last 72 hours. Sepsis Labs: No results for input(s): "PROCALCITON", "LATICACIDVEN" in the last 168 hours.  No results found for this or any previous visit (from the past 240 hour(s)).       Radiology Studies: No results found.      Scheduled Meds:  acetaminophen  1,000 mg Oral Q6H   enoxaparin (LOVENOX) injection  30 mg Subcutaneous Q12H   methocarbamol   500 mg Oral TID   potassium chloride  20 mEq Oral Q2H   Continuous Infusions:  sodium chloride Stopped (08/07/22 1738)   lactated ringers 75 mL/hr at 08/09/22 0819     LOS: 3 days    Time spent: 52 minutes spent on chart review, discussion with nursing staff, consultants, updating family and interview/physical exam; more than 50% of that time was spent in counseling and/or coordination of care.    Alvira Philips [place], DO [facility] Available via Epic secure chat 7am-7pm After these hours, please refer to coverage provider listed on [email or web address] 08/09/2022, 11:26 AM

## 2022-08-09 NOTE — Plan of Care (Signed)
  Problem: Education: Goal: Knowledge of General Education information will improve Description: Including pain rating scale, medication(s)/side effects and non-pharmacologic comfort measures Outcome: Progressing   Problem: Health Behavior/Discharge Planning: Goal: Ability to manage health-related needs will improve Outcome: Progressing   Problem: Clinical Measurements: Goal: Ability to maintain clinical measurements within normal limits will improve Outcome: Progressing Goal: Will remain free from infection Outcome: Progressing Goal: Diagnostic test results will improve Outcome: Progressing Goal: Respiratory complications will improve Outcome: Progressing Goal: Cardiovascular complication will be avoided Outcome: Progressing   Problem: Activity: Goal: Risk for activity intolerance will decrease Outcome: Progressing   Problem: Nutrition: Goal: Adequate nutrition will be maintained Outcome: Progressing   Problem: Coping: Goal: Level of anxiety will decrease Outcome: Progressing   Problem: Elimination: Goal: Will not experience complications related to bowel motility Outcome: Progressing Goal: Will not experience complications related to urinary retention Outcome: Progressing   Problem: Pain Managment: Goal: General experience of comfort will improve Outcome: Progressing   Problem: Safety: Goal: Ability to remain free from injury will improve Outcome: Progressing   Problem: Skin Integrity: Goal: Risk for impaired skin integrity will decrease Outcome: Progressing   Problem: Education: Goal: Understanding of discharge needs will improve Outcome: Progressing Goal: Verbalization of understanding of the causes of altered bowel function will improve Outcome: Progressing   Problem: Activity: Goal: Ability to tolerate increased activity will improve Outcome: Progressing   Problem: Bowel/Gastric: Goal: Gastrointestinal status for postoperative course will  improve Outcome: Progressing   Problem: Health Behavior/Discharge Planning: Goal: Identification of community resources to assist with postoperative recovery needs will improve Outcome: Progressing   Problem: Nutritional: Goal: Will attain and maintain optimal nutritional status will improve Outcome: Progressing   Problem: Clinical Measurements: Goal: Postoperative complications will be avoided or minimized Outcome: Progressing   Problem: Respiratory: Goal: Respiratory status will improve Outcome: Progressing   Problem: Skin Integrity: Goal: Will show signs of wound healing Outcome: Progressing   

## 2022-08-10 ENCOUNTER — Inpatient Hospital Stay (HOSPITAL_COMMUNITY): Payer: Medicare HMO

## 2022-08-10 DIAGNOSIS — G9341 Metabolic encephalopathy: Secondary | ICD-10-CM | POA: Diagnosis not present

## 2022-08-10 DIAGNOSIS — K561 Intussusception: Secondary | ICD-10-CM | POA: Diagnosis not present

## 2022-08-10 DIAGNOSIS — E872 Acidosis, unspecified: Secondary | ICD-10-CM

## 2022-08-10 DIAGNOSIS — K3189 Other diseases of stomach and duodenum: Secondary | ICD-10-CM | POA: Diagnosis not present

## 2022-08-10 DIAGNOSIS — J9690 Respiratory failure, unspecified, unspecified whether with hypoxia or hypercapnia: Secondary | ICD-10-CM | POA: Diagnosis not present

## 2022-08-10 DIAGNOSIS — R579 Shock, unspecified: Secondary | ICD-10-CM

## 2022-08-10 DIAGNOSIS — E162 Hypoglycemia, unspecified: Secondary | ICD-10-CM

## 2022-08-10 DIAGNOSIS — A419 Sepsis, unspecified organism: Secondary | ICD-10-CM | POA: Insufficient documentation

## 2022-08-10 DIAGNOSIS — Z452 Encounter for adjustment and management of vascular access device: Secondary | ICD-10-CM | POA: Diagnosis not present

## 2022-08-10 LAB — COMPREHENSIVE METABOLIC PANEL
ALT: 3651 U/L — ABNORMAL HIGH (ref 0–44)
AST: 7482 U/L — ABNORMAL HIGH (ref 15–41)
Albumin: 2.1 g/dL — ABNORMAL LOW (ref 3.5–5.0)
Alkaline Phosphatase: 142 U/L — ABNORMAL HIGH (ref 38–126)
BUN: 12 mg/dL (ref 8–23)
CO2: <7 mmol/L — ABNORMAL LOW (ref 22–32)
Calcium: 8.2 mg/dL — ABNORMAL LOW (ref 8.9–10.3)
Chloride: 103 mmol/L (ref 98–111)
Creatinine, Ser: 1.18 mg/dL — ABNORMAL HIGH (ref 0.44–1.00)
GFR, Estimated: 49 mL/min — ABNORMAL LOW (ref >60)
Glucose, Bld: 272 mg/dL — ABNORMAL HIGH (ref 70–99)
Potassium: 4.5 mmol/L (ref 3.5–5.1)
Sodium: 133 mmol/L — ABNORMAL LOW (ref 135–145)
Total Bilirubin: 6 mg/dL — ABNORMAL HIGH (ref 0.3–1.2)
Total Protein: 4.7 g/dL — ABNORMAL LOW (ref 6.5–8.1)

## 2022-08-10 LAB — GLUCOSE, CAPILLARY
Glucose-Capillary: 17 mg/dL — CL (ref 70–99)
Glucose-Capillary: 133 mg/dL — ABNORMAL HIGH (ref 70–99)
Glucose-Capillary: 129 mg/dL — ABNORMAL HIGH (ref 70–99)
Glucose-Capillary: 189 mg/dL — ABNORMAL HIGH (ref 70–99)
Glucose-Capillary: 210 mg/dL — ABNORMAL HIGH (ref 70–99)
Glucose-Capillary: 238 mg/dL — ABNORMAL HIGH (ref 70–99)
Glucose-Capillary: 249 mg/dL — ABNORMAL HIGH (ref 70–99)

## 2022-08-10 LAB — COOXEMETRY PANEL
Carboxyhemoglobin: 1.6 % — ABNORMAL HIGH (ref 0.5–1.5)
Methemoglobin: 1.4 % (ref 0.0–1.5)
O2 Saturation: 87.3 %
Total hemoglobin: 8.1 g/dL — ABNORMAL LOW (ref 12.0–16.0)

## 2022-08-10 LAB — BASIC METABOLIC PANEL
Anion gap: 15 (ref 5–15)
BUN: 8 mg/dL (ref 8–23)
CO2: 18 mmol/L — ABNORMAL LOW (ref 22–32)
Calcium: 8.9 mg/dL (ref 8.9–10.3)
Chloride: 103 mmol/L (ref 98–111)
Creatinine, Ser: 0.57 mg/dL (ref 0.44–1.00)
GFR, Estimated: >60 mL/min (ref >60)
Glucose, Bld: 70 mg/dL (ref 70–99)
Potassium: 4.4 mmol/L (ref 3.5–5.1)
Sodium: 136 mmol/L (ref 135–145)

## 2022-08-10 LAB — LACTIC ACID, PLASMA
Lactic Acid, Venous: >9 mmol/L (ref 0.5–1.9)
Lactic Acid, Venous: >9 mmol/L (ref 0.5–1.9)
Lactic Acid, Venous: >9 mmol/L (ref 0.5–1.9)

## 2022-08-10 LAB — CBC
HCT: 29.2 % — ABNORMAL LOW (ref 36.0–46.0)
Hemoglobin: 8.6 g/dL — ABNORMAL LOW (ref 12.0–15.0)
MCH: 28.8 pg (ref 26.0–34.0)
MCHC: 29.5 g/dL — ABNORMAL LOW (ref 30.0–36.0)
MCV: 97.7 fL (ref 80.0–100.0)
Platelets: 332 10*3/uL (ref 150–400)
RBC: 2.99 MIL/uL — ABNORMAL LOW (ref 3.87–5.11)
RDW: 13.8 % (ref 11.5–15.5)
WBC: 13.8 10*3/uL — ABNORMAL HIGH (ref 4.0–10.5)
nRBC: 0 % (ref 0.0–0.2)

## 2022-08-10 LAB — BLOOD GAS, VENOUS
Acid-base deficit: 22.8 mmol/L — ABNORMAL HIGH (ref 0.0–2.0)
Bicarbonate: 5.3 mmol/L — ABNORMAL LOW (ref 20.0–28.0)
O2 Saturation: 69.6 %
Patient temperature: 36.1
pCO2, Ven: <18 mmHg — CL (ref 44–60)
pH, Ven: 7.09 — CL (ref 7.25–7.43)
pO2, Ven: 42 mmHg (ref 32–45)

## 2022-08-10 LAB — MRSA NEXT GEN BY PCR, NASAL: MRSA by PCR Next Gen: NOT DETECTED

## 2022-08-10 LAB — MAGNESIUM: Magnesium: 2 mg/dL (ref 1.7–2.4)

## 2022-08-10 MED ORDER — PIPERACILLIN-TAZOBACTAM 3.375 G IVPB
3.3750 g | Freq: Three times a day (TID) | INTRAVENOUS | Status: AC
  Administered 2022-08-10 – 2022-08-16 (×19): 3.375 g via INTRAVENOUS
  Filled 2022-08-10 (×20): qty 50

## 2022-08-10 MED ORDER — VANCOMYCIN HCL IN DEXTROSE 1-5 GM/200ML-% IV SOLN
1000.0000 mg | Freq: Once | INTRAVENOUS | Status: AC
  Administered 2022-08-10: 1000 mg via INTRAVENOUS
  Filled 2022-08-10: qty 200

## 2022-08-10 MED ORDER — EPINEPHRINE 1 MG/10ML IJ SOSY
PREFILLED_SYRINGE | INTRAMUSCULAR | Status: AC
  Filled 2022-08-10: qty 10

## 2022-08-10 MED ORDER — LACTATED RINGERS IV BOLUS
500.0000 mL | Freq: Once | INTRAVENOUS | Status: AC
  Administered 2022-08-10: 500 mL via INTRAVENOUS

## 2022-08-10 MED ORDER — DEXTROSE IN LACTATED RINGERS 5 % IV SOLN: INTRAVENOUS | Status: DC

## 2022-08-10 MED ORDER — VANCOMYCIN HCL 750 MG/150ML IV SOLN: 750.0000 mg | INTRAVENOUS | Status: DC

## 2022-08-10 MED ORDER — SODIUM CHLORIDE 0.9 % IV BOLUS
500.0000 mL | Freq: Once | INTRAVENOUS | Status: AC
  Administered 2022-08-10: 500 mL via INTRAVENOUS

## 2022-08-10 MED ORDER — PIPERACILLIN-TAZOBACTAM 3.375 G IVPB 30 MIN
3.3750 g | Freq: Once | INTRAVENOUS | Status: AC
  Administered 2022-08-10: 3.375 g via INTRAVENOUS
  Filled 2022-08-10 (×2): qty 50

## 2022-08-10 MED ORDER — HYDROMORPHONE HCL 1 MG/ML IJ SOLN
0.5000 mg | INTRAMUSCULAR | Status: DC | PRN
  Administered 2022-08-12 – 2022-08-13 (×2): 0.5 mg via INTRAVENOUS
  Filled 2022-08-10 (×2): qty 1

## 2022-08-10 MED ORDER — SODIUM CHLORIDE 0.9 % IV SOLN: INTRAVENOUS | Status: DC

## 2022-08-10 MED ORDER — DEXTROSE-NACL 5-0.9 % IV SOLN: INTRAVENOUS | Status: DC

## 2022-08-10 MED ORDER — NOREPINEPHRINE 4 MG/250ML-% IV SOLN
2.0000 ug/min | INTRAVENOUS | Status: DC
  Administered 2022-08-10: 4 ug/min via INTRAVENOUS

## 2022-08-10 MED ORDER — SODIUM BICARBONATE 8.4 % IV SOLN
INTRAVENOUS | Status: AC
  Filled 2022-08-10: qty 50

## 2022-08-10 MED ORDER — CHLORHEXIDINE GLUCONATE CLOTH 2 % EX PADS
6.0000 | MEDICATED_PAD | Freq: Every day | CUTANEOUS | Status: DC
  Administered 2022-08-10 – 2022-08-26 (×18): 6 via TOPICAL

## 2022-08-10 MED ORDER — ORAL CARE MOUTH RINSE: 15.0000 mL | OROMUCOSAL | Status: DC | PRN

## 2022-08-10 MED ORDER — DEXTROSE 50 % IV SOLN
1.0000 | INTRAVENOUS | Status: AC
  Administered 2022-08-10: 50 mL via INTRAVENOUS

## 2022-08-10 MED ORDER — DEXTROSE 50 % IV SOLN
INTRAVENOUS | Status: AC
  Filled 2022-08-10: qty 50

## 2022-08-10 MED ORDER — IOHEXOL 300 MG/ML  SOLN
80.0000 mL | Freq: Once | INTRAMUSCULAR | Status: AC | PRN
  Administered 2022-08-10: 80 mL via INTRAVENOUS

## 2022-08-10 MED ORDER — LACTATED RINGERS IV BOLUS
1000.0000 mL | Freq: Once | INTRAVENOUS | Status: AC
  Administered 2022-08-10: 1000 mL via INTRAVENOUS

## 2022-08-10 MED ORDER — SODIUM CHLORIDE 0.9 % IV SOLN: 250.0000 mL | INTRAVENOUS | Status: DC

## 2022-08-10 MED ORDER — SODIUM BICARBONATE 8.4 % IV SOLN
INTRAVENOUS | Status: DC
  Filled 2022-08-10: qty 1000
  Filled 2022-08-10 (×3): qty 150
  Filled 2022-08-10: qty 1000
  Filled 2022-08-10 (×2): qty 150

## 2022-08-10 MED ORDER — SODIUM CHLORIDE 0.9 % IV BOLUS
1000.0000 mL | Freq: Once | INTRAVENOUS | Status: AC
  Administered 2022-08-10: 1000 mL via INTRAVENOUS

## 2022-08-10 MED ORDER — MIDODRINE HCL 5 MG PO TABS
2.5000 mg | ORAL_TABLET | Freq: Three times a day (TID) | ORAL | Status: DC
  Administered 2022-08-10: 2.5 mg via ORAL
  Filled 2022-08-10: qty 1

## 2022-08-10 NOTE — Progress Notes (Signed)
NE requirements rising More confused CVP placed and positioning verified by myself to assess CVP and ensure we are meeting her end-organ needs CT abd/pelvis ordered and transport pending to evaluate unexplained rising lactate  Simonne Martinet ACNP-BC [facility] Pulmonary/Critical Care Pager # [phone] OR # [phone] if no answer

## 2022-08-10 NOTE — Progress Notes (Signed)
   08/10/22 1610  Assess: MEWS Score  Temp 97.6 F (36.4 C)  BP (!) 76/50  MAP (mmHg) (!) 59  Pulse Rate 89  Resp 18  SpO2 100 %  O2 Device Room Air  Assess: MEWS Score  MEWS Temp 0  MEWS Systolic 2  MEWS Pulse 0  MEWS RR 0  MEWS LOC 0  MEWS Score 2  MEWS Score Color Yellow  Assess: if the MEWS score is Yellow or Red  Were vital signs taken at a resting state? Yes  Focused Assessment No change from prior assessment  Does the patient meet 2 or more of the SIRS criteria? No  MEWS guidelines implemented  Yes, yellow  Treat  MEWS Interventions Considered administering scheduled or prn medications/treatments as ordered  Take Vital Signs  Increase Vital Sign Frequency  Yellow: Q2hr x1, continue Q4hrs until patient remains green for 12hrs  Escalate  MEWS: Escalate Yellow: Discuss with charge nurse and consider notifying provider and/or RRT  Notify: Charge Nurse/RN  Name of Charge Nurse/RN Notified Tom RN  Provider Notification  Provider Name/Title Virgel Manifold NP  Date Provider Notified 08/10/22  Time Provider Notified [phone]  Method of Notification Page  Notification Reason Other (Comment) (hypotension)  Provider response See new orders  Notify: Rapid Response  Name of Rapid Response RN Notified Mandy RN  Date Rapid Response Notified 08/10/22  Time Rapid Response Notified 0639  Assess: SIRS CRITERIA  SIRS Temperature  0  SIRS Pulse 0  SIRS Respirations  0  SIRS WBC 0  SIRS Score Sum  0

## 2022-08-10 NOTE — Progress Notes (Signed)
Abg results obtained Adding bicarb as temporizing agent w/ her Ph 7.09  Simonne Martinet ACNP-BC [facility] Pulmonary/Critical Care Pager # [phone] OR # [phone] if no answer

## 2022-08-10 NOTE — Consult Note (Signed)
Patient moved to ICU due to hypotension/hypoglycemia. Pouch leaking, secure chat from RN asked that I come and help manage ostomy and midline incision.   WOC Nurse Consult Note: Reason for Consult:stool underneath dressing  Wound type:surgical  Pressure Injury POA: NA  Measurement: intact midline incision with staples in place   Drainage (amount, consistency, odor) none  Periwound:some stool noted on honeycomb dressing, ostomy directly to left of incision with leaking from right upper corner draining thin brown stool into incision  Dressing procedure/placement/frequency: Removed old honeycomb and Tegaderm, cleaned incision with NS, replaced Tegaderm dressing.    WOC Nurse ostomy follow up Stoma type/location: LUQ end colostomy  Stomal assessment/size: 7/8" oval, retracted, red moist  Peristomal assessment: skin intact, deep crease at 9 o'clock Treatment options for stomal/peristomal skin: 2" barrier ring cut in 2 pieces to help fill in crease at 9 o'clock where stool had leaked and went into incision; also added a piece of waterproof tape to area between incision and crease at right upper corner of skin barrier to aid in preventing stool from migrating into incision  Output small amount of brown thin stool  Ostomy pouching: 1pc. #[id number] Education provided: no education at this session. Patient is confused, restless.  Critical care NP assessing.    Enrolled patient in [place] Secure Start Discharge program: Yes  WOC team will continue to work with patient to educate her in care of stoma when she is medically stable.   Thank you,    Priscella Mann MSN, RN-BC, [employer] [phone]

## 2022-08-10 NOTE — Progress Notes (Signed)
Pharmacy Antibiotic Note  Sarah Moore is a 72 y.o. female admitted on 08/06/2022 with sepsis secondary to possible aspiration pneumonia and/or intraabdominal source of infection.  Pharmacy has been consulted for Vancomycin and Piperacillin/Tazobactam dosing.  Plan: - Piperacillin/Tazobactam 3.375 gm IV every 8 hours - Vancomycin 1 gm x 1 dose, then 750mg  IV every 24 hours (round Scr to 0.8, estimated AUC: 479) - Measure Vancomycin levels as needed. Goal AUC: 400-550 - Follow up renal function, culture results, and clinical course.  Height: 5' (152.4 cm) Weight: 50.8 kg (111 lb 15.9 oz) IBW/kg (Calculated) : 45.5  Temp (24hrs), Avg:97.2 F (36.2 C), Min:95 F (35 C), Max:98.1 F (36.7 C)  Recent Labs  Lab 08/06/22 1230 08/07/22 0653 08/08/22 0536 08/09/22 0537 08/10/22 0546 08/10/22 1010 08/10/22 1201  WBC 7.1  --  11.6* 8.3  --  13.8*  --   CREATININE 0.36* 0.45 0.55 0.45 0.57  --   --   LATICACIDVEN  --   --   --   --   --   --  >9.0*    Estimated Creatinine Clearance: 46.3 mL/min (by C-G formula based on SCr of 0.57 mg/dL).    Allergies  Allergen Reactions   Codeine Palpitations    Antimicrobials this admission: Piperacillin/Tazobactam 4/26 >>  Vancomycin 4/26  >>   Microbiology results: 4/26  MRSA PCR: negative  Thank you for allowing pharmacy to be a part of this patient's care.  Adrinne Sze Tylene Fantasia 08/10/2022 1:40 PM

## 2022-08-10 NOTE — Procedures (Signed)
Central Venous Catheter Insertion Procedure Note  EMY ANGEVINE  [id number]  November 06, 1950  Date:08/10/22  Time:5:01 PM   Provider Performing:Pete Bea Laura Tanja Port   Procedure: Insertion of Non-tunneled Central Venous [phone]) with US guidance ([id number])   Indication(s) Medication administration and Difficult access  Consent Risks of the procedure as well as the alternatives and risks of each were explained to the patient and/or caregiver.  Consent for the procedure was obtained and is signed in the bedside chart  Anesthesia Topical only with 1% lidocaine   Timeout Verified patient identification, verified procedure, site/side was marked, verified correct patient position, special equipment/implants available, medications/allergies/relevant history reviewed, required imaging and test results available.  Sterile Technique Maximal sterile technique was not able to be achieved due to emergent nature of procedure.  Procedure Description Area of catheter insertion was cleaned with chlorhexidine and draped in sterile fashion.  With real-time ultrasound guidance a central venous catheter was placed into the right internal jugular vein. Nonpulsatile blood flow and easy flushing noted in all ports.  The catheter was sutured in place and sterile dressing applied.  Complications/Tolerance None; patient tolerated the procedure well. Chest X-ray is ordered to verify placement for internal jugular or subclavian cannulation.   Chest x-ray is not ordered for femoral cannulation.  EBL Minimal  Specimen(s) None   Simonne Martinet ACNP-BC [facility] Pulmonary/Critical Care Pager # [phone] OR # [phone] if no answer

## 2022-08-10 NOTE — Significant Event (Signed)
Rapid Response Event Note   Reason for Call :  Hypotensive    Initial Focused Assessment:  Symptomatic hypotension after 1 liter bolus  B/P 76/46. Patient is confused, alert to self.   Interventions:  EKG Transfer to SD  Labs CBC     Event Summary: After arrival to SD. CBG of 17 followed Hypoglycemic standing orders.  Dr. [place] notified per Primary nurse.   MD Notified:  [place]  Call Time: 1040  Arrival Time: 1043  End Time: 1112   Sharyn Lull Edlin Ford, RN

## 2022-08-10 NOTE — Progress Notes (Signed)
PROGRESS NOTE    Sarah Moore  WUJ:[id number] DOB: [birth year] DOA: 08/06/2022 PCP: Norm Salt, PA    Brief Narrative:   Sarah Moore is a 72 y.o. female with past medical history significant for hyperlipidemia, chronic constipation, history of remote DVT not on anticoagulation who presented to [facility] ED on 4/22 with complaints of abdominal pain.  Patient reports last bowel movement 1.5 weeks ago.  Also endorses loss of appetite.  Patient was seen by urgent care 2 days prior to admission and was given Linzess and Fleet enema without improvement.  Denies nausea or vomiting.  No previous history of abdominal surgery.  Patient follows with GI outpatient, Dr. Elnoria Howard.  Recent flex sigmoidoscopy 4/9 that was reported as normal and colonoscopy February 2024 with only polyps noted.  At that time patient was advised to start taking lactulose.  In the ED, temperature 98.1 F, HR 85, RR 16, BP 123/106, SpO2 98% on room air.  WBC 7.1, hemoglobin 10.9, platelets 303.  Sodium 138, potassium 2.5, chloride 100, CO2 28, glucose 96, BUN 8, creatinine 0.36.  Lipase 24.  AST 14, ALT 10, total bili 1.3.  Urinalysis unrevealing.  CT abdomen/pelvis with colocolonic intussusception at the junction of the descending and sigmoid colon likely due to a large intraluminal lipoma serving as a lead point, mild mural thickening of the into the septum without frank evidence of bowel ischemia concerning for partial functional colonic obstruction with moderate gaseous distention of the proximal colon.  EDP discussed with GI, Dr. Loreta Ave who recommended general surgery consult.  Dr. Luisa Hart was consulted who advised maintain n.p.o. status and recommended hospitalist admission.  TRH consulted for admission for further evaluation management of large bowel obstruction.  Assessment & Plan:   Hypotension Overnight, patient with hypotension and confusion.  Received 1.5 L fluid bolus with poor response.  Unclear etiology  other than poor oral intake over the last several days.  Repeat CBC pending this morning. -- Transfer to ICU, start peripheral Levophed -- Discussed with PCCM  Large bowel obstruction 2/2 intraluminal colonic mass s/p Ex lap with partial colectomy and end colostomy The patient presented to ED with persistent abdominal pain.  History of chronic constipation, last reported bowel movement 1.5 weeks prior to admission.  Follows with GI outpatient, Dr. Elnoria Howard.  Patient is afebrile without leukocytosis.  CT abdomen/pelvis on admission with findings of colo-colonic intussusception at the junction of the descending and sigmoid colon likely due to a large intraluminal lipoma serving as a lead point, mild mural thickening of the into the septum without frank evidence of bowel ischemia concerning for partial functional colonic obstruction with moderate gaseous distention of the proximal colon.  General surgery was consulted and patient underwent ex lap with partial colectomy with end colostomy by Dr. Alvan Dame on 08/07/2022.  Findings of large intraluminal mass of the distal left colon; pathology with no dysplasia or malignancy. -- General Surgery following, appreciate assistance -- Sips of clears -- Surgical pathology: Pending -- Further per general surgery  Hypokalemia Potassium 2.5 on admission, likely secondary to poor oral intake in the days preceding hospitalization.  Repleted. -- Repeat potassium this morning, 2.6, will continue repletion with IV and oral potassium. -- Repeat BMP in a.m. to include magnesium  Hyperlipidemia Currently not on statin outpatient.   DVT prophylaxis: enoxaparin (LOVENOX) injection 30 mg Start: 08/08/22 1000 SCDs Start: 08/06/22 1950    Code Status: Full Code Family Communication: No family present at bedside this morning  Disposition Plan:  Level of care: ICU Status is: Inpatient Remains inpatient appropriate because: Transferring to ICU for hypotension     Consultants:  General surgery PCCM  Procedures:  Ex lap with partial colectomy and end colostomy, Dr. Sheliah Hatch 4/23  Antimicrobials:  Perioperative cefotetan   Subjective: Patient seen examined bedside, lying in bed.  Patient now hypotensive overnight, not responsive to IV fluid boluses.  Patient more confused.  Discussed with general surgery and will transfer to ICU.  Discussed with PCCM, starting peripheral Levophed.  Patient with mild abdominal discomfort and now with small amount of brown liquid stool in colostomy.    Objective: Vitals:   08/10/22 0648 08/10/22 0710 08/10/22 0757 08/10/22 1030  BP: (!) 77/45 (!) 76/46 (!) 70/45 (!) 77/40  Pulse:  98 (!) 106 (!) 124  Resp:   18   Temp:   (!) 97.5 F (36.4 C) 97.8 F (36.6 C)  TempSrc:   Oral Oral  SpO2:    100%  Weight:      Height:        Intake/Output Summary (Last 24 hours) at 08/10/2022 1056 Last data filed at 08/10/2022 1610 Gross per 24 hour  Intake --  Output 0 ml  Net 0 ml   Filed Weights   08/06/22 1020 08/07/22 1014  Weight: 52.2 kg 52.2 kg    Examination:  Physical Exam: GEN: NAD, alert and oriented to Place [facility]), Person (President: Sarah Moore), not oriented to time, elderly in appearance; ill in appearance HEENT: NCAT, PERRL, EOMI, sclera clear, dry mucous membranes PULM: CTAB w/o wheezes/crackles, normal respiratory effort, on room air CV: RRR w/o M/G/R GI: abd soft, noted colostomy with small amount of brown liquid stool in colostomy bag.  Surgical incision with honeycomb in place, clean/dry/intact, faint bowel sounds.   MSK: no peripheral edema, moves all extremities independently with preserved muscle strength NEURO: Slightly confused, otherwise no focal neurological deficits appreciated PSYCH: Depressed mood, flat affect Integumentary: Noted colostomy with midline abdominal surgical incision as above, otherwise no other concerning rashes/lesions/wounds noted on exposed skin  surfaces    Data Reviewed: I have personally reviewed following labs and imaging studies  CBC: Recent Labs  Lab 08/06/22 1230 08/08/22 0536 08/09/22 0537 08/10/22 1010  WBC 7.1 11.6* 8.3 13.8*  NEUTROABS 4.0  --   --   --   HGB 10.9* 9.8* 9.5* 8.6*  HCT 33.8* 30.4* 28.8* 29.2*  MCV 90.6 90.2 89.7 97.7  PLT 303 296 311 332   Basic Metabolic Panel: Recent Labs  Lab 08/06/22 1230 08/07/22 0653 08/08/22 0536 08/09/22 0537 08/10/22 0546  NA 138 138 136 136 136  K 2.5* 3.3* 3.4* 2.6* 4.4  CL 100 103 100 99 103  CO2 28 24 22 26  18*  GLUCOSE 96 76 108* 176* 70  BUN 8 <5* <5* 8 8  CREATININE 0.36* 0.45 0.55 0.45 0.57  CALCIUM 9.1 8.4* 9.2 8.8* 8.9  MG  --   --  2.0 2.0 2.0   GFR: Estimated Creatinine Clearance: 47.7 mL/min (by C-G formula based on SCr of 0.57 mg/dL). Liver Function Tests: Recent Labs  Lab 08/06/22 1230 08/08/22 0536  AST 14* 14*  ALT 10 9  ALKPHOS 55 51  BILITOT 1.3* 1.3*  PROT 7.2 6.3*  ALBUMIN 3.3* 2.8*   Recent Labs  Lab 08/06/22 1230  LIPASE 24   No results for input(s): "AMMONIA" in the last 168 hours. Coagulation Profile: No results for input(s): "INR", "PROTIME" in the last  168 hours. Cardiac Enzymes: No results for input(s): "CKTOTAL", "CKMB", "CKMBINDEX", "TROPONINI" in the last 168 hours. BNP (last 3 results) No results for input(s): "PROBNP" in the last 8760 hours. HbA1C: No results for input(s): "HGBA1C" in the last 72 hours. CBG: No results for input(s): "GLUCAP" in the last 168 hours. Lipid Profile: No results for input(s): "CHOL", "HDL", "LDLCALC", "TRIG", "CHOLHDL", "LDLDIRECT" in the last 72 hours. Thyroid Function Tests: No results for input(s): "TSH", "T4TOTAL", "FREET4", "T3FREE", "THYROIDAB" in the last 72 hours. Anemia Panel: No results for input(s): "VITAMINB12", "FOLATE", "FERRITIN", "TIBC", "IRON", "RETICCTPCT" in the last 72 hours. Sepsis Labs: No results for input(s): "PROCALCITON", "LATICACIDVEN" in the  last 168 hours.  No results found for this or any previous visit (from the past 240 hour(s)).       Radiology Studies: No results found.      Scheduled Meds:  acetaminophen  1,000 mg Oral Q6H   enoxaparin (LOVENOX) injection  30 mg Subcutaneous Q12H   methocarbamol  500 mg Oral TID   midodrine  2.5 mg Oral TID WC   Continuous Infusions:  sodium chloride Stopped (08/07/22 1738)   sodium chloride 125 mL/hr at 08/10/22 0810   sodium chloride     norepinephrine (LEVOPHED) Adult infusion       LOS: 4 days    Time spent: 60 minutes spent on chart review, discussion with nursing staff, consultants, updating family and interview/physical exam; more than 50% of that time was spent in counseling and/or coordination of care.    Alvira Philips [place], DO [facility] Available via Epic secure chat 7am-7pm After these hours, please refer to coverage provider listed on [email or web address] 08/10/2022, 10:56 AM

## 2022-08-10 NOTE — Progress Notes (Signed)
An USGPIV (ultrasound guided PIV) 22G x 1.75" on LFA has been placed for short-term vasopressor infusion. A correctly placed ivWatch must be used when administering Vasopressors. Should this treatment be needed beyond 72 hours, central line access should be obtained.  It will be the responsibility of the bedside nurse to follow best practice to prevent extravasations.   

## 2022-08-10 NOTE — Progress Notes (Signed)
Lactic acid still up  BP still labile.  Intermittently confused.  Plan Will get CT abd. She may need central line for CVp guidance  Will also add cortisol  Case discussed w/ surgery earlier today .   Simonne Martinet ACNP-BC [facility] Pulmonary/Critical Care Pager # [phone] OR # [phone] if no answer

## 2022-08-10 NOTE — Progress Notes (Addendum)
    Patient Name: Sarah ARSENEAULT           DOB: 1950/09/02  MRN: [id number]      Admission Date: 08/06/2022  Attending Provider: [place], Eric J, DO  Primary Diagnosis: Colonic intussusception [facility])   Level of care: Med-Surg    CROSS COVER NOTE   Date of Service   08/10/2022   [name], 72 y.o. female, was admitted on 08/06/2022 for Colonic intussusception [facility]).    HPI/Events of Note   RN reports hypotensive episode SBP 70-80s with MAP 60's.   Afebrile, other vital stable, on room air. Asymptomatic. No bleeding reported.  Patient remains alert and oriented x 4. Currently NPO. Appears dry.   Orders placed for fluid bolus.   Interventions/ Plan   1L NS bolus        Anthoney Harada, DNP, [employer] [facility] [place]

## 2022-08-10 NOTE — Progress Notes (Signed)
RT NOTE:  RT attempted to obtain ABG x2 and was unsuccessful. RT notified [place], MD. Verbal order for cancellation of ABG and place new order for VBG.

## 2022-08-10 NOTE — Progress Notes (Signed)
  3 Days Post-Op   Chief Complaint/Subjective: She is alert but altered mental status in comparison to my assessment yesterday. She is able to tell me her birth month and day but not year. She knows she is in the hospital but cannot tell my why or the year. She repeatedly states her birthday despite attempts at redirection  Objective: Vital signs in last 24 hours: Temp:  [97.4 F (36.3 C)-98.1 F (36.7 C)] 97.8 F (36.6 C) (04/26 1030) Pulse Rate:  [84-124] 124 (04/26 1030) Resp:  [14-18] 18 (04/26 0757) BP: (70-138)/(40-78) 77/40 (04/26 1030) SpO2:  [99 %-100 %] 100 % (04/26 1030)   Intake/Output from previous day: No intake/output data recorded.  PE: Gen: NAD Resp: nonlabored on room air Abd: soft, incision with honeycomb dressing in place and some stool leaking under dressing from colostomy which is productive with thin stool  Lab Results:  Recent Labs    08/08/22 0536 08/09/22 0537  WBC 11.6* 8.3  HGB 9.8* 9.5*  HCT 30.4* 28.8*  PLT 296 311    Recent Labs    08/09/22 0537 08/10/22 0546  NA 136 136  K 2.6* 4.4  CL 99 103  CO2 26 18*  GLUCOSE 176* 70  BUN 8 8  CREATININE 0.45 0.57  CALCIUM 8.8* 8.9    No results for input(s): "LABPROT", "INR" in the last 72 hours.    Component Value Date/Time   NA 136 08/10/2022 0546   NA 144 10/11/2008 1245   K 4.4 08/10/2022 0546   K 4.4 10/11/2008 1245   CL 103 08/10/2022 0546   CL 105 10/11/2008 1245   CO2 18 (L) 08/10/2022 0546   CO2 28 10/11/2008 1245   GLUCOSE 70 08/10/2022 0546   GLUCOSE 123 (H) 10/11/2008 1245   BUN 8 08/10/2022 0546   BUN 15 10/11/2008 1245   CREATININE 0.57 08/10/2022 0546   CREATININE 0.6 10/11/2008 1245   CALCIUM 8.9 08/10/2022 0546   CALCIUM 10.3 10/11/2008 1245   PROT 6.3 (L) 08/08/2022 0536   PROT 7.6 10/11/2008 1245   ALBUMIN 2.8 (L) 08/08/2022 0536   ALBUMIN 3.2 (L) 10/11/2008 1245   AST 14 (L) 08/08/2022 0536   AST 29 10/11/2008 1245   ALT 9 08/08/2022 0536   ALT 39  10/11/2008 1245   ALKPHOS 51 08/08/2022 0536   ALKPHOS 48 10/11/2008 1245   BILITOT 1.3 (H) 08/08/2022 0536   BILITOT 1.40 10/11/2008 1245   GFRNONAA >60 08/10/2022 0546   GFRAA >60 12/17/2019 1530    Assessment/Plan Colonic intussusception POD3 s/p EXPLORATORY LAPAROTOMY, sigmoid colectomy WITH COLOSTOMY 08/07/2022 Dr. Sheliah Hatch  - surgical path pending - AMS today and have discussed with medicine team - hypotensive and tachycardic - stat CBC ordered - having bowel function but given mental status will hold off on diet progression - discussed with RN this am to change bandage and ostomy pouch - encouraged ambulation  FEN - sips of clears VTE - lovenox ID - periop abx Foley - periop. Dc 4/24, voiding    LOS: 4 days   Eric Form, [facility] Surgery 08/10/2022, 10:51 AM Please see Amion for pager number during day hours 7:00am-4:30pm

## 2022-08-10 NOTE — Progress Notes (Signed)
Spoke to surg who has reviewed imaging.  Nothing acute from their standpoint Will cont CVP monitoring  Volume resuscitation  Ck SCVO2 Bicarb infusion and abx  Simonne Martinet ACNP-BC [facility] Pulmonary/Critical Care Pager # [phone] OR # [phone] if no answer

## 2022-08-10 NOTE — Progress Notes (Signed)
PT Cancellation Note  Patient Details Name: Sarah Moore MRN: [id number] DOB: Sep 29, 1950   Cancelled Treatment:     pt transferred to ICU this morning.  Will hold off Physical Therapy and see another day as schedule permits.  Pt has been evaluated.  Will continue to monitor for mobility/function.   Felecia Shelling  PTA Acute  Rehabilitation Services Office M-F          [phone]

## 2022-08-10 NOTE — Consult Note (Addendum)
NAME:  Sarah Moore, MRN:  [id number], DOB:  August 07, 1950, LOS: 4 ADMISSION DATE:  08/06/2022, CONSULTATION DATE:  4/26 REFERRING MD:  [place] , CHIEF COMPLAINT:  hypotension and altered mental status    History of Present Illness:  72 year old female w/ hx as outlined below. Presented on 4/22 to ER w/ cc abd pain, constipation and decreased appetite. Had been seen in urgent care 2 d prior and treated w/ Linzess and fleet enema. CT abd/pelvis: showed colocolonic intussusception @ the jxn of descending and sigmoid colon felt d/t intraluminal lipoma. Surgery was consulted. Initial recs: bowel rest, NPO w/ plan to bring to OR for bowel resxn.  Went to OR 4/23 where she underwent partial colectomy and end colostomy.  Hospital course  4/24 tolerating some clear liquids 4/25 nausea over night. Having some relief from this 4/26 rapid response called. Hypotensive w/ SBP 70s.  NaCl bolus administered. This was around 0630. On surgical and medical rounds later that morning pt noted to be confused, and still hypotensive after a total of 1.5 liters in resuscitation efforts. Stat CBC was obtained: hgb 9.5 to 8.6 wbc ct up to 13.8. lactate ordered and moved to ICU & PCCM consulted. Of note on arrival to ICU capillary blood glucose was noted at 17 & she was given 1/5 amp d50 and started on dextrose in her MIVFs. She did show improvement in her mental status correlating w/ improvement in blood sugar f/u at 133 (oriented X 3 but still slow to respond and sp a little slurred)  Pertinent  Medical History  Chronic constipation, remote DVT (no longer on [facility]), HL Significant Hospital Events: Including procedures, antibiotic start and stop dates in addition to other pertinent events   4/22 admit 4/23 partial colectomy and end colostomy 4/24-4/25 nausea w/ clears 4/26 confused, hypotensive moved to ICU. 1.5 liters saline. Started on NE gtt. Gluc 17, treated. Improved Mental status some. NO sig abd pain on exam rated pain  at 3. Surgical site clean and unremarkable   Interim History / Subjective:  Pain at 3 More awake   Objective   Blood pressure (Abnormal) 101/44, pulse (Abnormal) 115, temperature (Abnormal) 95 F (35 C), temperature source Rectal, resp. rate (Abnormal) 24, height 5' (1.524 m), weight 50.8 kg, SpO2 98 %.        Intake/Output Summary (Last 24 hours) at 08/10/2022 1217 Last data filed at 08/10/2022 7829 Gross per 24 hour  Intake no documentation  Output 0 ml  Net 0 ml   Filed Weights   08/06/22 1020 08/07/22 1014 08/10/22 1107  Weight: 52.2 kg 52.2 kg 50.8 kg    Examination: General: acutely ill appearing 72 year old female resting in bed. Her RR is slightly elevated but I do not see accessory use or nasal flare HENT: NCAT sclera non-icteric MMM no JVD  Lungs: clear no accessory use  Cardiovascular: tachy RRR no MRG  Abdomen: soft, ostomy pink. Staples intact Extremities: warm and dry  Neuro: awake, oriented x 3 now (person/place and date) moves all ext. She is a little slow to respond.  GU: voids   Resolved Hospital Problem list     Assessment & Plan:  Circulatory shock w/ lactic acidosis (undifferentiated shock). Think volume depletion BUT sepsis on ddx: aspiration vs abd source (although abd seems pretty benign). Latate > 9  Plan Cont IVF  Cont NE for MAP > 65 Blood cultures  Start empiric abx zosyn and vanc  Repeat lactic acid  If  lactate not clearing I think we need to consider repeat CT abd  Will get CXR looking for PNA Ck PCT  Respiratory distress Can't get pulse ox. RR is elevated Could be acidosis driving this Plan Abg Cxr  Symptomatic hypoglycemia  -gluc was 17. Certainly contributing to encephalopathy. Improved w/ glucose replacement  Plan Cont dextrose in IVFs Cont q 4 hr CBGs  Acute metabolic encephalopathy  -suspect mix of acidosis and hypoglycemia  Plan Keep euglycemic  Treat shock R/o infection  Supportive  care  Leukocytosis Plan Monitor   Post-operative anemia  Plan Cont to trend CBC  Trigger for transfusion < 7   Best Practice (right click and "Reselect all SmartList Selections" daily)   Diet/type: Regular consistency (see orders) DVT prophylaxis: LMWH GI prophylaxis: N/A Lines: N/A Foley:  N/A Code Status:  full code Last date of multidisciplinary goals of care discussion [pending]  Labs   CBC: Recent Labs  Lab 08/06/22 1230 08/08/22 0536 08/09/22 0537 08/10/22 1010  WBC 7.1 11.6* 8.3 13.8*  NEUTROABS 4.0  --   --   --   HGB 10.9* 9.8* 9.5* 8.6*  HCT 33.8* 30.4* 28.8* 29.2*  MCV 90.6 90.2 89.7 97.7  PLT 303 296 311 332    Basic Metabolic Panel: Recent Labs  Lab 08/06/22 1230 08/07/22 0653 08/08/22 0536 08/09/22 0537 08/10/22 0546  NA 138 138 136 136 136  K 2.5* 3.3* 3.4* 2.6* 4.4  CL 100 103 100 99 103  CO2 28 24 22 26  18*  GLUCOSE 96 76 108* 176* 70  BUN 8 <5* <5* 8 8  CREATININE 0.36* 0.45 0.55 0.45 0.57  CALCIUM 9.1 8.4* 9.2 8.8* 8.9  MG  --   --  2.0 2.0 2.0   GFR: Estimated Creatinine Clearance: 46.3 mL/min (by C-G formula based on SCr of 0.57 mg/dL). Recent Labs  Lab 08/06/22 1230 08/08/22 0536 08/09/22 0537 08/10/22 1010  WBC 7.1 11.6* 8.3 13.8*    Liver Function Tests: Recent Labs  Lab 08/06/22 1230 08/08/22 0536  AST 14* 14*  ALT 10 9  ALKPHOS 55 51  BILITOT 1.3* 1.3*  PROT 7.2 6.3*  ALBUMIN 3.3* 2.8*   Recent Labs  Lab 08/06/22 1230  LIPASE 24   No results for input(s): "AMMONIA" in the last 168 hours.  ABG No results found for: "PHART", "PCO2ART", "PO2ART", "HCO3", "TCO2", "ACIDBASEDEF", "O2SAT"   Coagulation Profile: No results for input(s): "INR", "PROTIME" in the last 168 hours.  Cardiac Enzymes: No results for input(s): "CKTOTAL", "CKMB", "CKMBINDEX", "TROPONINI" in the last 168 hours.  HbA1C: Hgb A1c MFr Bld  Date/Time Value Ref Range Status  12/12/2009 07:29 PM 5.3 <5.7 % Final    Comment:    See lab  report for associated comment(s)    CBG: Recent Labs  Lab 08/10/22 1125 08/10/22 1142  GLUCAP 17* 133*    Review of Systems:   Not able  Past Medical History:  She,  has a past medical history of Acid reflux, Arthritis, Chest pain, Fatigue, GERD (gastroesophageal reflux disease), and Hyperlipidemia.   Surgical History:   Past Surgical History:  Procedure Laterality Date   ABDOMINAL HYSTERECTOMY     COLONOSCOPY     KNEE ARTHROPLASTY Left    LAPAROTOMY N/A 08/07/2022   Procedure: EXPLORATORY LAPAROTOMY, sigmoid colectomy WITH COLOSTOMY;  Surgeon: Kinsinger, De Blanch, MD;  Location: [facility] ORS;  Service: General;  Laterality: N/A;     Social History:   reports that she has quit smoking. Her smoking  use included cigarettes. She smoked an average of .25 packs per day. She has never used smokeless tobacco. She reports that she does not drink alcohol and does not use drugs.   Family History:  Her family history includes Asthma in her brother; Clotting disorder in her son; Stomach cancer in her brother. She was adopted.   Allergies Allergies  Allergen Reactions   Codeine Palpitations     Home Medications  Prior to Admission medications   Medication Sig Start Date End Date Taking? Authorizing Provider  cycloSPORINE (RESTASIS) 0.05 % ophthalmic emulsion Place 1 drop into both eyes as needed (for dry eyes).   Yes [provider]  Glycerin-Polysorbate 80 (REFRESH DRY EYE THERAPY OP) Apply 1 drop to eye daily as needed.   Yes [provider]  lactulose (CHRONULAC) 10 GM/15ML solution Take 15 mLs (10 g total) by mouth 2 (two) times daily as needed for moderate constipation or severe constipation. 08/02/22  Yes Crain, Whitney L, PA  Multiple Vitamin (MULTIVITAMIN WITH MINERALS) TABS tablet Take 2 tablets by mouth daily.   Yes [provider]  PRESCRIPTION MEDICATION Place 1 Application rectally as needed ("after trips to the restroom" hemorrhoids/bleeding).  Nitroglycerin 0.125 % ointment   Yes [provider]  atorvastatin (LIPITOR) 20 MG tablet Take 20 mg by mouth daily. Patient not taking: Reported on 08/08/2022 05/10/22   [provider]  gabapentin (NEURONTIN) 300 MG capsule as needed. Patient not taking: Reported on 08/08/2022    [provider]  hydrOXYzine (ATARAX) 25 MG tablet as needed. Patient not taking: Reported on 08/08/2022    [provider]  omeprazole (PRILOSEC) 40 MG capsule Take 40 mg by mouth daily. Patient not taking: Reported on 08/08/2022 04/13/22   [provider]     Critical care time: 32 min     Simonne Martinet ACNP-BC [place] Pulmonary/Critical Care Pager # [phone] OR # [phone] if no answer

## 2022-08-11 ENCOUNTER — Telehealth: Payer: Self-pay | Admitting: Internal Medicine

## 2022-08-11 ENCOUNTER — Inpatient Hospital Stay (HOSPITAL_COMMUNITY): Payer: Medicare HMO

## 2022-08-11 DIAGNOSIS — A419 Sepsis, unspecified organism: Secondary | ICD-10-CM | POA: Diagnosis not present

## 2022-08-11 DIAGNOSIS — R6521 Severe sepsis with septic shock: Secondary | ICD-10-CM

## 2022-08-11 DIAGNOSIS — R570 Cardiogenic shock: Secondary | ICD-10-CM

## 2022-08-11 DIAGNOSIS — K561 Intussusception: Secondary | ICD-10-CM | POA: Diagnosis not present

## 2022-08-11 LAB — GLUCOSE, CAPILLARY
Glucose-Capillary: 205 mg/dL — ABNORMAL HIGH (ref 70–99)
Glucose-Capillary: 179 mg/dL — ABNORMAL HIGH (ref 70–99)
Glucose-Capillary: 182 mg/dL — ABNORMAL HIGH (ref 70–99)
Glucose-Capillary: 171 mg/dL — ABNORMAL HIGH (ref 70–99)
Glucose-Capillary: 190 mg/dL — ABNORMAL HIGH (ref 70–99)
Glucose-Capillary: 185 mg/dL — ABNORMAL HIGH (ref 70–99)

## 2022-08-11 LAB — ECHOCARDIOGRAM COMPLETE
AR max vel: 2.03 cm2
AV Area VTI: 2.06 cm2
AV Area mean vel: 1.97 cm2
AV Mean grad: 4 mmHg
AV Peak grad: 8.2 mmHg
Ao pk vel: 1.44 m/s
Area-P 1/2: 3.28 cm2
Calc EF: 57.1 %
Height: 60 in
MV VTI: 1.89 cm2
S' Lateral: 2.3 cm
Single Plane A2C EF: 54.7 %
Single Plane A4C EF: 59.5 %
Weight: 1791.9 oz

## 2022-08-11 LAB — COMPREHENSIVE METABOLIC PANEL
ALT: 3363 U/L — ABNORMAL HIGH (ref 0–44)
AST: 5054 U/L — ABNORMAL HIGH (ref 15–41)
Albumin: 1.9 g/dL — ABNORMAL LOW (ref 3.5–5.0)
Alkaline Phosphatase: 140 U/L — ABNORMAL HIGH (ref 38–126)
Anion gap: 15 (ref 5–15)
BUN: 11 mg/dL (ref 8–23)
CO2: 21 mmol/L — ABNORMAL LOW (ref 22–32)
Calcium: 7.8 mg/dL — ABNORMAL LOW (ref 8.9–10.3)
Chloride: 98 mmol/L (ref 98–111)
Creatinine, Ser: 1.26 mg/dL — ABNORMAL HIGH (ref 0.44–1.00)
GFR, Estimated: 46 mL/min — ABNORMAL LOW (ref >60)
Glucose, Bld: 228 mg/dL — ABNORMAL HIGH (ref 70–99)
Potassium: 3.2 mmol/L — ABNORMAL LOW (ref 3.5–5.1)
Sodium: 134 mmol/L — ABNORMAL LOW (ref 135–145)
Total Bilirubin: 5.7 mg/dL — ABNORMAL HIGH (ref 0.3–1.2)
Total Protein: 4.2 g/dL — ABNORMAL LOW (ref 6.5–8.1)

## 2022-08-11 LAB — CBC
HCT: 23.1 % — ABNORMAL LOW (ref 36.0–46.0)
Hemoglobin: 7.6 g/dL — ABNORMAL LOW (ref 12.0–15.0)
MCH: 29.5 pg (ref 26.0–34.0)
MCHC: 32.9 g/dL (ref 30.0–36.0)
MCV: 89.5 fL (ref 80.0–100.0)
Platelets: 293 10*3/uL (ref 150–400)
RBC: 2.58 MIL/uL — ABNORMAL LOW (ref 3.87–5.11)
RDW: 14.6 % (ref 11.5–15.5)
WBC: 12.3 10*3/uL — ABNORMAL HIGH (ref 4.0–10.5)
nRBC: 0.3 % — ABNORMAL HIGH (ref 0.0–0.2)

## 2022-08-11 LAB — LACTIC ACID, PLASMA
Lactic Acid, Venous: 7.1 mmol/L (ref 0.5–1.9)
Lactic Acid, Venous: 6 mmol/L (ref 0.5–1.9)
Lactic Acid, Venous: 4.4 mmol/L (ref 0.5–1.9)

## 2022-08-11 LAB — LIPASE, BLOOD: Lipase: 44 U/L (ref 11–51)

## 2022-08-11 LAB — MAGNESIUM: Magnesium: 1.7 mg/dL (ref 1.7–2.4)

## 2022-08-11 LAB — CK TOTAL AND CKMB (NOT AT ARMC)
CK, MB: 5.3 ng/mL — ABNORMAL HIGH (ref 0.5–5.0)
Total CK: 31 U/L — ABNORMAL LOW (ref 38–234)

## 2022-08-11 LAB — TROPONIN I (HIGH SENSITIVITY): Troponin I (High Sensitivity): 780 ng/L (ref <18)

## 2022-08-11 LAB — CK: Total CK: 28 U/L — ABNORMAL LOW (ref 38–234)

## 2022-08-11 MED ORDER — ENOXAPARIN SODIUM 30 MG/0.3ML IJ SOSY: 30.0000 mg | PREFILLED_SYRINGE | INTRAMUSCULAR | Status: DC

## 2022-08-11 MED ORDER — LACTATED RINGERS IV BOLUS
1000.0000 mL | Freq: Once | INTRAVENOUS | Status: AC
  Administered 2022-08-11: 1000 mL via INTRAVENOUS

## 2022-08-11 MED ORDER — HEPARIN SODIUM (PORCINE) 5000 UNIT/ML IJ SOLN: 5000.0000 [IU] | Freq: Three times a day (TID) | INTRAMUSCULAR | Status: DC

## 2022-08-11 MED ORDER — VANCOMYCIN VARIABLE DOSE PER UNSTABLE RENAL FUNCTION (PHARMACIST DOSING): Status: DC

## 2022-08-11 MED ORDER — POTASSIUM CHLORIDE CRYS ER 20 MEQ PO TBCR
20.0000 meq | EXTENDED_RELEASE_TABLET | ORAL | Status: DC
  Administered 2022-08-11: 20 meq via ORAL
  Filled 2022-08-11: qty 1

## 2022-08-11 MED ORDER — MAGNESIUM SULFATE 2 GM/50ML IV SOLN
2.0000 g | Freq: Once | INTRAVENOUS | Status: AC
  Administered 2022-08-11: 2 g via INTRAVENOUS
  Filled 2022-08-11: qty 50

## 2022-08-11 MED ORDER — POTASSIUM CHLORIDE 10 MEQ/50ML IV SOLN
10.0000 meq | INTRAVENOUS | Status: AC
  Administered 2022-08-11 (×4): 10 meq via INTRAVENOUS
  Filled 2022-08-11 (×4): qty 50

## 2022-08-11 MED ORDER — LACTATED RINGERS IV BOLUS
500.0000 mL | Freq: Once | INTRAVENOUS | Status: AC
  Administered 2022-08-11: 500 mL via INTRAVENOUS

## 2022-08-11 NOTE — Progress Notes (Signed)
4 Days Post-Op   Subjective/Chief Complaint: Better this am overall   Objective: Vital signs in last 24 hours: Temp:  [95 F (35 C)-100 F (37.8 C)] 98.6 F (37 C) (04/27 0800) Pulse Rate:  [87-126] 87 (04/27 0800) Resp:  [17-34] 19 (04/27 0800) BP: (74-145)/(38-120) 116/66 (04/27 0800) SpO2:  [90 %-100 %] 93 % (04/27 0800) Weight:  [50.8 kg] 50.8 kg (04/26 1107) Last BM Date :  (PTA)  Intake/Output from previous day: 04/26 0701 - 04/27 0700 In: 3242.2 [I.V.:1381.2; IV Piggyback:1861.1] Out: 800 [Urine:800] Intake/Output this shift: Total I/O In: 88.6 [IV Piggyback:88.6] Out: -   Ab soft approp tender nothing in bag this am  Lab Results:  Recent Labs    08/10/22 1010 08/11/22 0445  WBC 13.8* 12.3*  HGB 8.6* 7.6*  HCT 29.2* 23.1*  PLT 332 293   BMET Recent Labs    08/10/22 1740 08/11/22 0445  NA 133* 134*  K 4.5 3.2*  CL 103 98  CO2 <7* 21*  GLUCOSE 272* 228*  BUN 12 11  CREATININE 1.18* 1.26*  CALCIUM 8.2* 7.8*   PT/INR No results for input(s): "LABPROT", "INR" in the last 72 hours. ABG Recent Labs    08/10/22 1529  HCO3 5.3*    Studies/Results: CT ABDOMEN PELVIS W CONTRAST  Result Date: 08/10/2022 CLINICAL DATA:  72 year old with postop abdominal pain. History of colectomy and colostomy. EXAM: CT ABDOMEN AND PELVIS WITH CONTRAST TECHNIQUE: Multidetector CT imaging of the abdomen and pelvis was performed using the standard protocol following bolus administration of intravenous contrast. RADIATION DOSE REDUCTION: This exam was performed according to the departmental dose-optimization program which includes automated exposure control, adjustment of the mA and/or kV according to patient size and/or use of iterative reconstruction technique. CONTRAST:  80mL OMNIPAQUE IOHEXOL 300 MG/ML  SOLN COMPARISON:  CT abdomen and pelvis 08/06/2022 FINDINGS: Lower chest: 3 mm nodular density along the right major fissure on the image 16/6 was probably present in 2014  and suggestive for a benign finding. Otherwise, the lung bases are clear. Hepatobiliary: High-density material in the gallbladder without distension or inflammatory changes. Main portal venous system is patent. No biliary dilatation. Stable appearance of the liver. Patient has a hemangioma involving the left hepatic lobe which is poorly characterized on this exam and markedly decreased in size compared to the exam in 2014. Pancreas: Unremarkable. No pancreatic ductal dilatation or surrounding inflammatory changes. Spleen: Normal in size without focal abnormality. Adrenals/Urinary Tract: Normal adrenal glands. Normal appearance of both kidneys without hydronephrosis. Normal appearance of the urinary bladder. Stomach/Bowel: Partial colectomy with resection of at least a portion of the sigmoid colon. Patient has a Hartmann's pouch. There is a colostomy involving the descending colon. Mild distention of the stomach with air-fluid level. No small bowel distention or obstruction. Mild wall thickening along the proximal aspect of the Hartmann's pouch. There appears to be a normal appendix. Vascular/Lymphatic: Aorta at the hiatus measures up to 3.4 cm with eccentric plaque occupying approximately 50% of the lumen. Diffuse atherosclerotic calcifications involving the abdominal aorta. No significant lymph node enlargement in the abdomen or pelvis. Reproductive: Prostate is unremarkable. Other: Small amount of slightly dense fluid along the right side of the pelvis on image 63/2. Suspect this represents postoperative fluid with blood products. Small air-fluid collections near the Hartmann's pouch on image 62/2 are likely associated with small bowel loops. Multiple pockets of gas along the lateral left abdomen and upper pelvic region are compatible with recent postoperative changes. Small  amount of free air in the upper abdomen. No suspicious postoperative fluid collections. Mild mesenteric edema. Surgical skin staples along  the anterior abdominal wall. Musculoskeletal: No acute bone abnormality. IMPRESSION: 1. Expected postoperative changes following a partial colectomy and colostomy creation. Multiple small pockets of air within the abdomen are compatible with recent surgery. Small amount of slightly dense fluid in the pelvis could represent blood products. No suspicious postoperative fluid collections at this time. 2. 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 Recommend follow-up ultrasound every 3 years. This recommendation follows ACR consensus guidelines: White Paper of the ACR Incidental Findings Committee II on Vascular Findings. J Am Coll Radiol 2013; 16:109-604. 3.  Aortic Atherosclerosis (ICD10-I70.0). Electronically Signed   By: Richarda Overlie M.D.   On: 08/10/2022 17:23   DG CHEST PORT 1 VIEW  Result Date: 08/10/2022 CLINICAL DATA:  Central line placement. EXAM: PORTABLE CHEST 1 VIEW COMPARISON:  One-view chest x-ray 08/10/2022 at 1:55 p.m. FINDINGS: Heart size is normal. A right IJ line has been placed. The tip is at the. Rule junction. No pneumothorax is present. Lung volumes are low. No edema or effusion is present. No focal airspace disease is present. The visualized soft tissues and bony thorax are unremarkable. IMPRESSION: Interval placement of right IJ line. The tip is at the cavoatrial junction. No pneumothorax. Electronically Signed   By: Marin Roberts M.D.   On: 08/10/2022 16:22   DG CHEST PORT 1 VIEW  Result Date: 08/10/2022 CLINICAL DATA:  Respiratory failure EXAM: PORTABLE CHEST 1 VIEW COMPARISON:  05/13/2021 FINDINGS: Cardiac size is within normal limits. Thoracic aorta is ectatic. There are no signs of pulmonary edema or focal pulmonary consolidation. There is no pleural effusion or pneumothorax. IMPRESSION: No active disease. Electronically Signed   By: Ernie Avena M.D.   On: 08/10/2022 14:10    Anti-infectives: Anti-infectives (From admission, onward)    Start      Dose/Rate Route Frequency Ordered Stop   08/11/22 1500  vancomycin (VANCOREADY) IVPB 750 mg/150 mL  Status:  Discontinued        750 mg 150 mL/hr over 60 Minutes Intravenous Every 24 hours 08/10/22 1340 08/11/22 0518   08/11/22 0518  vancomycin variable dose per unstable renal function (pharmacist dosing)         Does not apply See admin instructions 08/11/22 0518     08/10/22 2200  piperacillin-tazobactam (ZOSYN) IVPB 3.375 g        3.375 g 12.5 mL/hr over 240 Minutes Intravenous Every 8 hours 08/10/22 1334     08/10/22 1430  piperacillin-tazobactam (ZOSYN) IVPB 3.375 g        3.375 g 100 mL/hr over 30 Minutes Intravenous  Once 08/10/22 1333 08/10/22 1451   08/10/22 1430  vancomycin (VANCOCIN) IVPB 1000 mg/200 mL premix        1,000 mg 200 mL/hr over 60 Minutes Intravenous  Once 08/10/22 1338 08/10/22 1507   08/07/22 1000  cefoTEtan (CEFOTAN) 2 g in sodium chloride 0.9 % 100 mL IVPB  Status:  Discontinued        2 g 200 mL/hr over 30 Minutes Intravenous On call to O.R. 08/07/22 0946 08/07/22 1005       Assessment/Plan: Colonic intussusception POD 4 s/p sigmoid colectomy/colostomy-LK   - surgical path benign - became ill yesterday but better today, ct as expected postop, not hemorrhage, better with volume, will continue to follow FEN - sips of clears VTE - lovenox -appreciate ICU care  Emelia Loron 08/11/2022

## 2022-08-11 NOTE — Progress Notes (Signed)
   Despite clinical improvement and coming off pressors, lactate clearance is poor. Ur OP is sluggis  Recent Labs  Lab 08/08/22 0536 08/09/22 0537 08/10/22 0546 08/10/22 1740 08/11/22 0445  CREATININE 0.55 0.45 0.57 1.18* 1.26*   Recent Labs  Lab 08/10/22 1351 08/10/22 1746 08/11/22 0915  LATICACIDVEN >9.0* >9.0* 7.1*    Trop 700s Echo results pending  Lipase normal  CK result penidng  Unable to find source for lactate  Plan  - cont bic gtt - 1.5L LR Bolus earlier (d;w RN) - recheck lactate  0 await CK    SIGNATURE    Dr. Kalman Shan, M.D., F.C.C.P,  Pulmonary and Critical Care Medicine Staff Physician, [facility] Center Director - Interstitial Lung Disease  Program  Medical Director - [facility] Pulmonary Fibrosis [facility] Network at [facility] [place], [place], [address]   Pager: [phone], If no answer  -> Check AMION or Try [phone] Telephone (clinical office): [phone] Telephone (research): [phone]  12:07 PM 08/11/2022

## 2022-08-11 NOTE — Progress Notes (Signed)
Pharmacy Antibiotic Note  Sarah Moore is a 72 y.o. female admitted on 08/06/2022 with sepsis secondary to possible aspiration pneumonia and/or intraabdominal source of infection.  Pharmacy has been consulted for Vancomycin and Piperacillin/Tazobactam dosing.  4/26: MRSA PCR negative Scr increased 0.36 >>1.18 since admission 24h I/O +2.7 L   Plan: - Piperacillin/Tazobactam 3.375 gm IV every 8 hours - D/C standing Vancomycin order.  Check random level on 4/28 if needs to continue. - Follow up renal function, culture results, and clinical course.  Height: 5' (152.4 cm) Weight: 50.8 kg (111 lb 15.9 oz) IBW/kg (Calculated) : 45.5  Temp (24hrs), Avg:98.5 F (36.9 C), Min:95 F (35 C), Max:100 F (37.8 C)  Recent Labs  Lab 08/06/22 1230 08/07/22 0653 08/08/22 0536 08/09/22 0537 08/10/22 0546 08/10/22 1010 08/10/22 1201 08/10/22 1351 08/10/22 1740 08/10/22 1746  WBC 7.1  --  11.6* 8.3  --  13.8*  --   --   --   --   CREATININE 0.36* 0.45 0.55 0.45 0.57  --   --   --  1.18*  --   LATICACIDVEN  --   --   --   --   --   --  >9.0* >9.0*  --  >9.0*     Estimated Creatinine Clearance: 31.4 mL/min (A) (by C-G formula based on SCr of 1.18 mg/dL (H)).    Allergies  Allergen Reactions   Codeine Palpitations    Antimicrobials this admission: Piperacillin/Tazobactam 4/26 >>  Vancomycin 4/26  >>   Microbiology results: 4/26  MRSA PCR: negative  Thank you for allowing pharmacy to be a part of this patient's care.  Junita Push PharmD 08/11/2022 5:14 AM

## 2022-08-11 NOTE — Progress Notes (Signed)
   Recent Labs  Lab 08/10/22 1746 08/11/22 0915 08/11/22 1443  LATICACIDVEN >9.0* 7.1* 6.0*    Lactate responding to fluids CK/Liapse ok  Plan  Repeat 1L LR    SIGNATURE    Dr. Kalman Shan, M.D., F.C.C.P,  Pulmonary and Critical Care Medicine Staff Physician, [facility] Center Director - Interstitial Lung Disease  Program  Medical Director - [facility] Pulmonary Fibrosis [facility] at [facility] [place], [place], [address]   Pager: [phone], If no answer  -> Check AMION or Try [phone] Telephone (clinical office): [phone] Telephone (research): [phone]  4:42 PM 08/11/2022

## 2022-08-11 NOTE — Progress Notes (Signed)
NAME:  Sarah Moore, MRN:  [id number], DOB:  Jan 14, 1951, LOS: 5 ADMISSION DATE:  08/06/2022, CONSULTATION DATE:  4/26 REFERRING MD:  [place] , CHIEF COMPLAINT:  hypotension and altered mental status    BRIEF  72 year old female w/ hx as outlined below. Presented on 4/22 to ER w/ cc abd pain, constipation and decreased appetite. Had been seen in urgent care 2 d prior and treated w/ Linzess and fleet enema. CT abd/pelvis: showed colocolonic intussusception @ the jxn of descending and sigmoid colon felt d/t intraluminal lipoma. Surgery was consulted. Initial recs: bowel rest, NPO w/ plan to bring to OR for bowel resxn.  Went to OR 4/23 where she underwent partial colectomy and end colostomy.  Hospital course  4/24 tolerating some clear liquids 4/25 nausea over night. Having some relief from this 4/26 rapid response called. Hypotensive w/ SBP 70s.  NaCl bolus administered. This was around 0630. On surgical and medical rounds later that morning pt noted to be confused, and still hypotensive after a total of 1.5 liters in resuscitation efforts. Stat CBC was obtained: hgb 9.5 to 8.6 wbc ct up to 13.8. lactate ordered and moved to ICU & PCCM consulted. Of note on arrival to ICU capillary blood glucose was noted at 17 & she was given 1/5 amp d50 and started on dextrose in her MIVFs. She did show improvement in her mental status correlating w/ improvement in blood sugar f/u at 133 (oriented X 3 but still slow to respond and sp a little slurred)  Pertinent  Medical History  Chronic constipation, remote DVT (no longer on [facility]), HL   has a past medical history of Acid reflux, Arthritis, Chest pain, Fatigue, GERD (gastroesophageal reflux disease), and Hyperlipidemia.   has a past surgical history that includes Knee Arthroplasty (Left); Abdominal hysterectomy; Colonoscopy; and laparotomy (N/A, 08/07/2022).   Significant Hospital Events: Including procedures, antibiotic start and stop dates in addition to other  pertinent events   4/22 admit 4/23 partial colectomy and end colostomy 4/24-4/25 nausea w/ clears 4/26 confused, hypotensive moved to ICU. 1.5 liters saline. Started on NE gtt. Gluc 17, treated. Improved Mental status some. NO sig abd pain on exam rated pain at 3. Surgical site clean and unremarkable  CVL placed Lactic acid 9 CTAbd - non diagnostic STart bic gtt  Interim History / Subjective:    08/11/22  - cvp low yesteray. And given fluids. CVP 11 today. Lactate pending. Off levophed.  l ow grade fee with high wbc today.  Off bair hugger. On bic gtt 125cc/h -> Ur OP 225cc/12h  Objective   Blood pressure 116/66, pulse 87, temperature 98.6 F (37 C), temperature source Bladder, resp. rate 19, height 5' (1.524 m), weight 50.8 kg, SpO2 93 %. CVP:  [5 mmHg-77 mmHg] 11 mmHg      Intake/Output Summary (Last 24 hours) at 08/11/2022 0813 Last data filed at 08/11/2022 0800 Gross per 24 hour  Intake 3330.78 ml  Output 800 ml  Net 2530.78 ml   Filed Weights   08/06/22 1020 08/07/22 1014 08/10/22 1107  Weight: 52.2 kg 52.2 kg 50.8 kg    Examination:  General Appearance:  Looks cachectic and deconditoined. ECHO in progress Head:  Normocephalic, without obvious abnormality, atraumatic Eyes:  PERRL - yes, conjunctiva/corneas - muddy     Ears:  Normal external ear canals, both ears Nose:  G tube - no Throat:  ETT TUBE - no , OG tube - no Neck:  Supple,  No enlargement/tenderness/nodules  Lungs: Clear to auscultation bilaterally, Heart:  S1 and S2 normal, no murmur, CVP - 11.  Pressors - off Abdomen:  Soft, no masses, no organomegaly. OSTOMY + Genitalia / Rectal:  Not done Extremities:  Extremities- intact Skin:  ntact in exposed areas . Sacral area - not examined Neurologic:  Sedation - none -> RASS - 0. Moves all 4s - lying quiet but can. CAM-ICU - answers simple question . Orientation - unable toa sses      Resolved Hospital Problem list     Assessment & Plan:  Status post  laparotomy for colonic intussusception with ostomy/23/24 for interval lipoma  Plan  - per Surgery  Circulatory shock w/ lactic acidosis (undifferentiated shock). Think volume depletion BUT sepsis on ddx: aspiration vs abd source (although abd seems pretty benign). Latate > 9   08/11/22: Resolved shock.  Off Levophed.  Lactate results pending.  Cause likely volume depletion  Plan Cont IVF via bicarbonate Blood cultures  Continue empiric abx zosyn Discontinue vanc [MRSA PCR - 08/10/2022]  Await lactic acid Check amylase, lipase, CK and procalcitonin and troponin Await echocardiogram results  Acute kidney injury [baseline creatinine 0.5 mg percent] -onset 08/10/2022 creatinine 1.18 mg With metabolic acidosis  08/11/2022: Creatinine 1.26 mg percent and worse.  Making some urine.  Probably due to volume depletion and septic shock  Plan - Continue bicarb - Fluid bolus - Monitor    Symptomatic hypoglycemia 08/10/2022: -gluc was 17. Certainly contributing to encephalopathy. Improved w/ glucose replacement.  Likely due to etiology of circulatory shock  08/11/2022: Improved blood glucose levels   Plan Cont dextrose in IVFs Cont q 4 hr CBGs  Acute metabolic encephalopathy  - -suspect mix of acidosis and hypoglycemia 08/10/2022  08/11/2022: Improved  Plan Keep euglycemic  Treat shock R/o infection  Supportive care  Vomiting 08/10/22  08/11/2022 still happening.  CT abdomen/pelvis noncontributory  Plan  - NG tube to low intermittent suction Place -Await troponin, amylase, lipase, CK and lactic acid and procalcitonin -Zofran as needed -N.p.o. except clears and meds  Shock liver 08/10/22  08/11/2022 improving  Plan  - monitor  Electrolyte imbalance  08/11/2022: Mild low magnesium and potassium  Plan - Replete via intravenous route  Post-operative anemia  Plan Cont to trend CBC  Trigger for transfusion < 7   Best Practice (right click and "Reselect all SmartList  Selections" daily)   Diet/type: Regular consistency (see orders) DVT prophylaxis: LMWH GI prophylaxis: N/A Lines: N/A Foley:  N/A Code Status:  full code Last date of multidisciplinary goals of care discussion [pending   4/27 - Son Karenann Mcgrory [phone] called and updated. He wanted to know how long the ostomy will stay in place.  Redirected him to request the answer from the surgeon.  Disposition: Moved to stepdown unit.  Hospitalist to take over tomorrow 08/12/2022   ATTESTATION & SIGNATURE   The patient Sarah Moore is critically ill with multiple organ systems failure and requires high complexity decision making for assessment and support, frequent evaluation and titration of therapies, application of advanced monitoring technologies and extensive interpretation of multiple databases and discussion with other appropriate health care personnel such as bedside nurses, social workers, case Production designer, theatre/television/film, consultants, respiratory therapists, nutritionists, secretaries etc.,  Critical care time includes but is not restricted to just documentation time. Documentation can happen in parallel or sequential to care time depending on case mix urgency and priorities for the shift. So, overall critical Care Time devoted to patient care services  described in this note is  30  Minutes.   This time reflects time of care of this signee Dr Kalman Shan which includ does not reflect procedure time, or teaching time or supervisory time of PA/NP/Med student/Med Resident etc but could involve care discussion time     Dr. Kalman Shan, M.D., [facility].C.P Pulmonary and Critical Care Medicine Medical Director - [facility] ICU Staff Physician, [place] [place] [facility] Pager: [phone], If no answer or between  15:00h - 7:00h: call [phone]  08/11/2022 8:13 AM     LABS    PULMONARY Recent Labs  Lab 08/10/22 1529 08/10/22 1739  HCO3 5.3*  --    O2SAT 69.6 87.3    CBC Recent Labs  Lab 08/09/22 0537 08/10/22 1010 08/11/22 0445  HGB 9.5* 8.6* 7.6*  HCT 28.8* 29.2* 23.1*  WBC 8.3 13.8* 12.3*  PLT 311 332 293    COAGULATION No results for input(s): "INR" in the last 168 hours.  CARDIAC  No results for input(s): "TROPONINI" in the last 168 hours. No results for input(s): "PROBNP" in the last 168 hours.   CHEMISTRY Recent Labs  Lab 08/08/22 0536 08/09/22 0537 08/10/22 0546 08/10/22 1740 08/11/22 0445  NA 136 136 136 133* 134*  K 3.4* 2.6* 4.4 4.5 3.2*  CL 100 99 103 103 98  CO2 22 26 18* <7* 21*  GLUCOSE 108* 176* 70 272* 228*  BUN <5* 8 8 12 11   CREATININE 0.55 0.45 0.57 1.18* 1.26*  CALCIUM 9.2 8.8* 8.9 8.2* 7.8*  MG 2.0 2.0 2.0  --  1.7   Estimated Creatinine Clearance: 29.4 mL/min (A) (by C-G formula based on SCr of 1.26 mg/dL (H)).   LIVER Recent Labs  Lab 08/06/22 1230 08/08/22 0536 08/10/22 1740 08/11/22 0445  AST 14* 14* 7,482* 5,054*  ALT 10 9 3,651* 3,363*  ALKPHOS 55 51 142* 140*  BILITOT 1.3* 1.3* 6.0* 5.7*  PROT 7.2 6.3* 4.7* 4.2*  ALBUMIN 3.3* 2.8* 2.1* 1.9*     INFECTIOUS Recent Labs  Lab 08/10/22 1201 08/10/22 1351 08/10/22 1746  LATICACIDVEN >9.0* >9.0* >9.0*     ENDOCRINE CBG (last 3)  Recent Labs    08/10/22 2319 08/11/22 0403 08/11/22 0724  GLUCAP 249* 205* 179*         IMAGING x48h  - image(s) personally visualized  -   highlighted in bold CT ABDOMEN PELVIS W CONTRAST  Result Date: 08/10/2022 CLINICAL DATA:  72 year old with postop abdominal pain. History of colectomy and colostomy. EXAM: CT ABDOMEN AND PELVIS WITH CONTRAST TECHNIQUE: Multidetector CT imaging of the abdomen and pelvis was performed using the standard protocol following bolus administration of intravenous contrast. RADIATION DOSE REDUCTION: This exam was performed according to the departmental dose-optimization program which includes automated exposure control, adjustment of the mA and/or  kV according to patient size and/or use of iterative reconstruction technique. CONTRAST:  80mL OMNIPAQUE IOHEXOL 300 MG/ML  SOLN COMPARISON:  CT abdomen and pelvis 08/06/2022 FINDINGS: Lower chest: 3 mm nodular density along the right major fissure on the image 16/6 was probably present in 2014 and suggestive for a benign finding. Otherwise, the lung bases are clear. Hepatobiliary: High-density material in the gallbladder without distension or inflammatory changes. Main portal venous system is patent. No biliary dilatation. Stable appearance of the liver. Patient has a hemangioma involving the left hepatic lobe which is poorly characterized on this exam and markedly decreased in size compared to  the exam in 2014. Pancreas: Unremarkable. No pancreatic ductal dilatation or surrounding inflammatory changes. Spleen: Normal in size without focal abnormality. Adrenals/Urinary Tract: Normal adrenal glands. Normal appearance of both kidneys without hydronephrosis. Normal appearance of the urinary bladder. Stomach/Bowel: Partial colectomy with resection of at least a portion of the sigmoid colon. Patient has a Hartmann's pouch. There is a colostomy involving the descending colon. Mild distention of the stomach with air-fluid level. No small bowel distention or obstruction. Mild wall thickening along the proximal aspect of the Hartmann's pouch. There appears to be a normal appendix. Vascular/Lymphatic: Aorta at the hiatus measures up to 3.4 cm with eccentric plaque occupying approximately 50% of the lumen. Diffuse atherosclerotic calcifications involving the abdominal aorta. No significant lymph node enlargement in the abdomen or pelvis. Reproductive: Prostate is unremarkable. Other: Small amount of slightly dense fluid along the right side of the pelvis on image 63/2. Suspect this represents postoperative fluid with blood products. Small air-fluid collections near the Hartmann's pouch on image 62/2 are likely associated with  small bowel loops. Multiple pockets of gas along the lateral left abdomen and upper pelvic region are compatible with recent postoperative changes. Small amount of free air in the upper abdomen. No suspicious postoperative fluid collections. Mild mesenteric edema. Surgical skin staples along the anterior abdominal wall. Musculoskeletal: No acute bone abnormality. IMPRESSION: 1. Expected postoperative changes following a partial colectomy and colostomy creation. Multiple small pockets of air within the abdomen are compatible with recent surgery. Small amount of slightly dense fluid in the pelvis could represent blood products. No suspicious postoperative fluid collections at this time. 2. 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 Recommend follow-up ultrasound every 3 years. This recommendation follows ACR consensus guidelines: White Paper of the ACR Incidental Findings Committee II on Vascular Findings. J Am Coll Radiol 2013; 16:109-604. 3.  Aortic Atherosclerosis (ICD10-I70.0). Electronically Signed   By: Richarda Overlie M.D.   On: 08/10/2022 17:23   DG CHEST PORT 1 VIEW  Result Date: 08/10/2022 CLINICAL DATA:  Central line placement. EXAM: PORTABLE CHEST 1 VIEW COMPARISON:  One-view chest x-ray 08/10/2022 at 1:55 p.m. FINDINGS: Heart size is normal. A right IJ line has been placed. The tip is at the. Rule junction. No pneumothorax is present. Lung volumes are low. No edema or effusion is present. No focal airspace disease is present. The visualized soft tissues and bony thorax are unremarkable. IMPRESSION: Interval placement of right IJ line. The tip is at the cavoatrial junction. No pneumothorax. Electronically Signed   By: Marin Roberts M.D.   On: 08/10/2022 16:22   DG CHEST PORT 1 VIEW  Result Date: 08/10/2022 CLINICAL DATA:  Respiratory failure EXAM: PORTABLE CHEST 1 VIEW COMPARISON:  05/13/2021 FINDINGS: Cardiac size is within normal limits. Thoracic aorta is ectatic. There are  no signs of pulmonary edema or focal pulmonary consolidation. There is no pleural effusion or pneumothorax. IMPRESSION: No active disease. Electronically Signed   By: Ernie Avena M.D.   On: 08/10/2022 14:10

## 2022-08-11 NOTE — Progress Notes (Signed)
[facility] ADULT ICU REPLACEMENT PROTOCOL   The patient does apply for the [facility] Adult ICU Electrolyte Replacment Protocol based on the criteria listed below:   1.Exclusion criteria: TCTS, ECMO, Dialysis, and Myasthenia Gravis patients 2. Is GFR >/= 30 ml/min? Yes.    Patient's GFR today is 46 3. Is SCr </= 2? Yes.   Patient's SCr is 1.26 mg/dL 4. Did SCr increase >/= 0.5 in 24 hours? No. 5.Pt's weight >40kg  Yes.   6. Abnormal electrolyte(s):   K 3.2, Mg 1.7  7. Electrolytes replaced per protocol 8.  Call MD STAT for K+ </= 2.5, Phos </= 1, or Mag </= 1 Physician:  Alfonso Ramus R Channelle Bottger 08/11/2022 5:52 AM

## 2022-08-12 ENCOUNTER — Inpatient Hospital Stay (HOSPITAL_COMMUNITY): Payer: Medicare HMO

## 2022-08-12 DIAGNOSIS — K561 Intussusception: Secondary | ICD-10-CM | POA: Diagnosis not present

## 2022-08-12 DIAGNOSIS — K824 Cholesterolosis of gallbladder: Secondary | ICD-10-CM | POA: Diagnosis not present

## 2022-08-12 DIAGNOSIS — R109 Unspecified abdominal pain: Secondary | ICD-10-CM | POA: Diagnosis not present

## 2022-08-12 DIAGNOSIS — R0602 Shortness of breath: Secondary | ICD-10-CM | POA: Diagnosis not present

## 2022-08-12 DIAGNOSIS — K760 Fatty (change of) liver, not elsewhere classified: Secondary | ICD-10-CM | POA: Diagnosis not present

## 2022-08-12 DIAGNOSIS — J90 Pleural effusion, not elsewhere classified: Secondary | ICD-10-CM | POA: Diagnosis not present

## 2022-08-12 LAB — CBC
HCT: 24.7 % — ABNORMAL LOW (ref 36.0–46.0)
Hemoglobin: 8.2 g/dL — ABNORMAL LOW (ref 12.0–15.0)
MCH: 28.7 pg (ref 26.0–34.0)
MCHC: 33.2 g/dL (ref 30.0–36.0)
MCV: 86.4 fL (ref 80.0–100.0)
Platelets: 311 10*3/uL (ref 150–400)
RBC: 2.86 MIL/uL — ABNORMAL LOW (ref 3.87–5.11)
RDW: 13.6 % (ref 11.5–15.5)
WBC: 9.9 10*3/uL (ref 4.0–10.5)
nRBC: 2 % — ABNORMAL HIGH (ref 0.0–0.2)

## 2022-08-12 LAB — COMPREHENSIVE METABOLIC PANEL
ALT: 2353 U/L — ABNORMAL HIGH (ref 0–44)
AST: 1862 U/L — ABNORMAL HIGH (ref 15–41)
Albumin: 1.6 g/dL — ABNORMAL LOW (ref 3.5–5.0)
Alkaline Phosphatase: 144 U/L — ABNORMAL HIGH (ref 38–126)
Anion gap: 12 (ref 5–15)
BUN: 13 mg/dL (ref 8–23)
CO2: 34 mmol/L — ABNORMAL HIGH (ref 22–32)
Calcium: 7.2 mg/dL — ABNORMAL LOW (ref 8.9–10.3)
Chloride: 89 mmol/L — ABNORMAL LOW (ref 98–111)
Creatinine, Ser: 1.32 mg/dL — ABNORMAL HIGH (ref 0.44–1.00)
GFR, Estimated: 43 mL/min — ABNORMAL LOW (ref >60)
Glucose, Bld: 214 mg/dL — ABNORMAL HIGH (ref 70–99)
Potassium: 3 mmol/L — ABNORMAL LOW (ref 3.5–5.1)
Sodium: 135 mmol/L (ref 135–145)
Total Bilirubin: 8.4 mg/dL — ABNORMAL HIGH (ref 0.3–1.2)
Total Protein: 3.8 g/dL — ABNORMAL LOW (ref 6.5–8.1)

## 2022-08-12 LAB — OCCULT BLOOD X 1 CARD TO LAB, STOOL: Fecal Occult Bld: POSITIVE — AB

## 2022-08-12 LAB — GLUCOSE, CAPILLARY
Glucose-Capillary: 141 mg/dL — ABNORMAL HIGH (ref 70–99)
Glucose-Capillary: 157 mg/dL — ABNORMAL HIGH (ref 70–99)
Glucose-Capillary: 158 mg/dL — ABNORMAL HIGH (ref 70–99)
Glucose-Capillary: 179 mg/dL — ABNORMAL HIGH (ref 70–99)
Glucose-Capillary: 179 mg/dL — ABNORMAL HIGH (ref 70–99)
Glucose-Capillary: 192 mg/dL — ABNORMAL HIGH (ref 70–99)

## 2022-08-12 LAB — CK TOTAL AND CKMB (NOT AT ARMC)
CK, MB: 2.2 ng/mL (ref 0.5–5.0)
Total CK: 21 U/L — ABNORMAL LOW (ref 38–234)

## 2022-08-12 LAB — LACTIC ACID, PLASMA: Lactic Acid, Venous: 3.7 mmol/L (ref 0.5–1.9)

## 2022-08-12 LAB — MAGNESIUM: Magnesium: 2.8 mg/dL — ABNORMAL HIGH (ref 1.7–2.4)

## 2022-08-12 LAB — PROCALCITONIN: Procalcitonin: 10.96 ng/mL

## 2022-08-12 LAB — HEMOGLOBIN AND HEMATOCRIT, BLOOD
HCT: 26.4 % — ABNORMAL LOW (ref 36.0–46.0)
Hemoglobin: 8.9 g/dL — ABNORMAL LOW (ref 12.0–15.0)

## 2022-08-12 LAB — PROTIME-INR
INR: 3.6 — ABNORMAL HIGH (ref 0.8–1.2)
Prothrombin Time: 35.7 seconds — ABNORMAL HIGH (ref 11.4–15.2)

## 2022-08-12 LAB — TROPONIN I (HIGH SENSITIVITY): Troponin I (High Sensitivity): 461 ng/L (ref <18)

## 2022-08-12 MED ORDER — POTASSIUM CHLORIDE 10 MEQ/100ML IV SOLN
10.0000 meq | INTRAVENOUS | Status: DC
  Administered 2022-08-12 (×2): 10 meq via INTRAVENOUS
  Filled 2022-08-12 (×2): qty 100

## 2022-08-12 MED ORDER — PANTOPRAZOLE SODIUM 40 MG IV SOLR
40.0000 mg | Freq: Two times a day (BID) | INTRAVENOUS | Status: DC
  Administered 2022-08-12 – 2022-08-23 (×23): 40 mg via INTRAVENOUS
  Filled 2022-08-12 (×23): qty 10

## 2022-08-12 MED ORDER — KCL IN DEXTROSE-NACL 20-5-0.9 MEQ/L-%-% IV SOLN
INTRAVENOUS | Status: AC
  Filled 2022-08-12 (×6): qty 1000

## 2022-08-12 MED ORDER — POTASSIUM CHLORIDE 10 MEQ/50ML IV SOLN
10.0000 meq | INTRAVENOUS | Status: AC
  Administered 2022-08-12 (×4): 10 meq via INTRAVENOUS
  Filled 2022-08-12 (×4): qty 50

## 2022-08-12 NOTE — Progress Notes (Signed)
   D/w Dr [place] of [place]  Lactate slowly better but creat some bette Off presspr   Recent Labs  Lab 08/11/22 1443 08/11/22 2205 08/12/22 0621 08/12/22 0622  LATICACIDVEN 6.0* 4.4* 3.7*  --   PROCALCITON  --   --   --  10.96    Recent Labs  Lab 08/06/22 1230 08/08/22 0536 08/10/22 1740 08/11/22 0445 08/12/22 0622  AST 14* 14* 7,482* 5,054* 1,862*  ALT 10 9 3,651* 3,363* 2,353*  ALKPHOS 55 51 142* 140* 144*  BILITOT 1.3* 1.3* 6.0* 5.7* 8.4*  PROT 7.2 6.3* 4.7* 4.2* 3.8*  ALBUMIN 3.3* 2.8* 2.1* 1.9* 1.6*  INR  --   --   --   --  3.6*    CCM can sign off H ewill call if needed     SIGNATURE    Dr. Kalman Shan, M.D., F.C.C.P,  Pulmonary and Critical Care Medicine Staff Physician, [facility] Center Director - Interstitial Lung Disease  Program  Medical Director - [facility] Pulmonary Fibrosis [facility] at [facility] [place], [place], [address]   Pager: [phone], If no answer  -> Check AMION or Try [phone] Telephone (clinical office): [phone] Telephone (research): [phone]  9:51 AM 08/12/2022

## 2022-08-12 NOTE — Progress Notes (Signed)
5 Days Post-Op   Subjective/Chief Complaint: Alert and conversing some this am, had a large bm via stoma   Objective: Vital signs in last 24 hours: Temp:  [96.6 F (35.9 C)-98.8 F (37.1 C)] 97.7 F (36.5 C) (04/28 0600) Pulse Rate:  [79-100] 85 (04/28 0600) Resp:  [14-22] 14 (04/28 0600) BP: (97-145)/(59-91) 126/72 (04/28 0600) SpO2:  [89 %-94 %] 93 % (04/28 0600) Last BM Date :  (PTA)  Intake/Output from previous day: 04/27 0701 - 04/28 0700 In: 2920.5 [I.V.:1800; IV Piggyback:1120.5] Out: 975 [Urine:975] Intake/Output this shift: No intake/output data recorded.  Ab stoma pink and functional (I saw with appliance off this am), wound without infection- staples intact, approp tender  Lab Results:  Recent Labs    08/11/22 0445 08/12/22 0622  WBC 12.3* 9.9  HGB 7.6* 8.2*  HCT 23.1* 24.7*  PLT 293 311   BMET Recent Labs    08/11/22 0445 08/12/22 0622  NA 134* 135  K 3.2* 3.0*  CL 98 89*  CO2 21* 34*  GLUCOSE 228* 214*  BUN 11 13  CREATININE 1.26* 1.32*  CALCIUM 7.8* 7.2*   PT/INR Recent Labs    08/12/22 0622  LABPROT 35.7*  INR 3.6*   ABG Recent Labs    08/10/22 1529  HCO3 5.3*    Studies/Results: ECHOCARDIOGRAM COMPLETE  Result Date: 08/11/2022    ECHOCARDIOGRAM REPORT   Patient Name:   Sarah Moore Tallman Date of Exam: 08/11/2022 Medical Rec #:  [id number]     Height:       60.0 in Accession #:    [id number]    Weight:       112.0 lb Date of Birth:  08-May-1950    BSA:          1.459 m Patient Age:    71 years      BP:           116/66 mmHg Patient Gender: F             HR:           87 bpm. Exam Location:  Inpatient Procedure: 2D Echo, Cardiac Doppler and Color Doppler Indications:    Shock  History:        Patient has prior history of Echocardiogram examinations, most                 recent 08/01/2021. Risk Factors:Dyslipidemia.  Sonographer:    Wallie Char Referring Phys: [id number] MURALI RAMASWAMY IMPRESSIONS  1. Left ventricular ejection fraction, by  estimation, is 60 to 65%. The left ventricle has normal function. The left ventricle has no regional wall motion abnormalities. Left ventricular diastolic parameters are consistent with Grade I diastolic dysfunction (impaired relaxation).  2. Right ventricular systolic function is normal. The right ventricular size is normal. There is moderately elevated pulmonary artery systolic pressure. The estimated right ventricular systolic pressure is 50.3 mmHg.  3. The mitral valve is normal in structure. No evidence of mitral valve regurgitation. No evidence of mitral stenosis.  4. The aortic valve is normal in structure. Aortic valve regurgitation is not visualized. No aortic stenosis is present.  5. The inferior vena cava is dilated in size with <50% respiratory variability, suggesting right atrial pressure of 15 mmHg. Comparison(s): Prior images reviewed side by side. The left ventricular function is unchanged. Increased right atrial pressure and pulmonary artery pressure is now seen, but right ventricular function remains normal. FINDINGS  Left Ventricle: Left ventricular ejection fraction, by estimation,  is 60 to 65%. The left ventricle has normal function. The left ventricle has no regional wall motion abnormalities. The left ventricular internal cavity size was normal in size. There is  no left ventricular hypertrophy. Left ventricular diastolic parameters are consistent with Grade I diastolic dysfunction (impaired relaxation). Indeterminate filling pressures. Right Ventricle: The right ventricular size is normal. No increase in right ventricular wall thickness. Right ventricular systolic function is normal. There is moderately elevated pulmonary artery systolic pressure. The tricuspid regurgitant velocity is 2.97 m/s, and with an assumed right atrial pressure of 15 mmHg, the estimated right ventricular systolic pressure is 50.3 mmHg. Left Atrium: Left atrial size was normal in size. Right Atrium: Right atrial size  was normal in size. Pericardium: There is no evidence of pericardial effusion. Mitral Valve: The mitral valve is normal in structure. No evidence of mitral valve regurgitation. No evidence of mitral valve stenosis. MV peak gradient, 7.7 mmHg. The mean mitral valve gradient is 3.0 mmHg. Tricuspid Valve: The tricuspid valve is normal in structure. Tricuspid valve regurgitation is mild . No evidence of tricuspid stenosis. Aortic Valve: The aortic valve is normal in structure. Aortic valve regurgitation is not visualized. No aortic stenosis is present. Aortic valve mean gradient measures 4.0 mmHg. Aortic valve peak gradient measures 8.2 mmHg. Aortic valve area, by VTI measures 2.06 cm. Pulmonic Valve: The pulmonic valve was normal in structure. Pulmonic valve regurgitation is mild. No evidence of pulmonic stenosis. Aorta: The aortic root is normal in size and structure. Venous: The inferior vena cava is dilated in size with less than 50% respiratory variability, suggesting right atrial pressure of 15 mmHg. IAS/Shunts: No atrial level shunt detected by color flow Doppler.  LEFT VENTRICLE PLAX 2D LVIDd:         3.30 cm     Diastology LVIDs:         2.30 cm     LV e' medial:    7.19 cm/s LV PW:         1.10 cm     LV E/e' medial:  12.1 LV IVS:        1.10 cm     LV e' lateral:   8.97 cm/s LVOT diam:     1.90 cm     LV E/e' lateral: 9.7 LV SV:         51 LV SV Index:   35 LVOT Area:     2.84 cm  LV Volumes (MOD) LV vol d, MOD A2C: 57.6 ml LV vol d, MOD A4C: 68.6 ml LV vol s, MOD A2C: 26.1 ml LV vol s, MOD A4C: 27.8 ml LV SV MOD A2C:     31.5 ml LV SV MOD A4C:     68.6 ml LV SV MOD BP:      36.3 ml RIGHT VENTRICLE             IVC RV Basal diam:  3.00 cm     IVC diam: 2.50 cm RV S prime:     14.50 cm/s TAPSE (M-mode): 2.3 cm LEFT ATRIUM             Index        RIGHT ATRIUM           Index LA diam:        3.10 cm 2.12 cm/m   RA Area:     10.70 cm LA Vol (A2C):   23.2 ml 15.90 ml/m  RA Volume:   21.00 ml  14.39  ml/m LA  Vol (A4C):   20.9 ml 14.33 ml/m LA Biplane Vol: 22.1 ml 15.15 ml/m  AORTIC VALVE AV Area (Vmax):    2.03 cm AV Area (Vmean):   1.97 cm AV Area (VTI):     2.06 cm AV Vmax:           143.50 cm/s AV Vmean:          97.150 cm/s AV VTI:            0.249 m AV Peak Grad:      8.2 mmHg AV Mean Grad:      4.0 mmHg LVOT Vmax:         102.50 cm/s LVOT Vmean:        67.550 cm/s LVOT VTI:          0.180 m LVOT/AV VTI ratio: 0.72  AORTA Ao Root diam: 2.90 cm Ao Asc diam:  3.10 cm MITRAL VALVE                TRICUSPID VALVE MV Area (PHT): 3.28 cm     TR Peak grad:   35.3 mmHg MV Area VTI:   1.89 cm     TR Vmax:        297.00 cm/s MV Peak grad:  7.7 mmHg MV Mean grad:  3.0 mmHg     SHUNTS MV Vmax:       1.39 m/s     Systemic VTI:  0.18 m MV Vmean:      77.6 cm/s    Systemic Diam: 1.90 cm MV Decel Time: 231 msec MV E velocity: 87.20 cm/s MV A velocity: 120.00 cm/s MV E/A ratio:  0.73 Mihai Croitoru MD Electronically signed by Thurmon Fair MD Signature Date/Time: 08/11/2022/11:18:35 AM    Final    CT ABDOMEN PELVIS W CONTRAST  Result Date: 08/10/2022 CLINICAL DATA:  72 year old with postop abdominal pain. History of colectomy and colostomy. EXAM: CT ABDOMEN AND PELVIS WITH CONTRAST TECHNIQUE: Multidetector CT imaging of the abdomen and pelvis was performed using the standard protocol following bolus administration of intravenous contrast. RADIATION DOSE REDUCTION: This exam was performed according to the departmental dose-optimization program which includes automated exposure control, adjustment of the mA and/or kV according to patient size and/or use of iterative reconstruction technique. CONTRAST:  80mL OMNIPAQUE IOHEXOL 300 MG/ML  SOLN COMPARISON:  CT abdomen and pelvis 08/06/2022 FINDINGS: Lower chest: 3 mm nodular density along the right major fissure on the image 16/6 was probably present in 2014 and suggestive for a benign finding. Otherwise, the lung bases are clear. Hepatobiliary: High-density material in the  gallbladder without distension or inflammatory changes. Main portal venous system is patent. No biliary dilatation. Stable appearance of the liver. Patient has a hemangioma involving the left hepatic lobe which is poorly characterized on this exam and markedly decreased in size compared to the exam in 2014. Pancreas: Unremarkable. No pancreatic ductal dilatation or surrounding inflammatory changes. Spleen: Normal in size without focal abnormality. Adrenals/Urinary Tract: Normal adrenal glands. Normal appearance of both kidneys without hydronephrosis. Normal appearance of the urinary bladder. Stomach/Bowel: Partial colectomy with resection of at least a portion of the sigmoid colon. Patient has a Hartmann's pouch. There is a colostomy involving the descending colon. Mild distention of the stomach with air-fluid level. No small bowel distention or obstruction. Mild wall thickening along the proximal aspect of the Hartmann's pouch. There appears to be a normal appendix. Vascular/Lymphatic: Aorta at the hiatus measures up to 3.4 cm with  eccentric plaque occupying approximately 50% of the lumen. Diffuse atherosclerotic calcifications involving the abdominal aorta. No significant lymph node enlargement in the abdomen or pelvis. Reproductive: Prostate is unremarkable. Other: Small amount of slightly dense fluid along the right side of the pelvis on image 63/2. Suspect this represents postoperative fluid with blood products. Small air-fluid collections near the Hartmann's pouch on image 62/2 are likely associated with small bowel loops. Multiple pockets of gas along the lateral left abdomen and upper pelvic region are compatible with recent postoperative changes. Small amount of free air in the upper abdomen. No suspicious postoperative fluid collections. Mild mesenteric edema. Surgical skin staples along the anterior abdominal wall. Musculoskeletal: No acute bone abnormality. IMPRESSION: 1. Expected postoperative changes  following a partial colectomy and colostomy creation. Multiple small pockets of air within the abdomen are compatible with recent surgery. Small amount of slightly dense fluid in the pelvis could represent blood products. No suspicious postoperative fluid collections at this time. 2. 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 Recommend follow-up ultrasound every 3 years. This recommendation follows ACR consensus guidelines: White Paper of the ACR Incidental Findings Committee II on Vascular Findings. J Am Coll Radiol 2013; 13:086-578. 3.  Aortic Atherosclerosis (ICD10-I70.0). Electronically Signed   By: Richarda Overlie M.D.   On: 08/10/2022 17:23   DG CHEST PORT 1 VIEW  Result Date: 08/10/2022 CLINICAL DATA:  Central line placement. EXAM: PORTABLE CHEST 1 VIEW COMPARISON:  One-view chest x-ray 08/10/2022 at 1:55 p.m. FINDINGS: Heart size is normal. A right IJ line has been placed. The tip is at the. Rule junction. No pneumothorax is present. Lung volumes are low. No edema or effusion is present. No focal airspace disease is present. The visualized soft tissues and bony thorax are unremarkable. IMPRESSION: Interval placement of right IJ line. The tip is at the cavoatrial junction. No pneumothorax. Electronically Signed   By: Marin Roberts M.D.   On: 08/10/2022 16:22   DG CHEST PORT 1 VIEW  Result Date: 08/10/2022 CLINICAL DATA:  Respiratory failure EXAM: PORTABLE CHEST 1 VIEW COMPARISON:  05/13/2021 FINDINGS: Cardiac size is within normal limits. Thoracic aorta is ectatic. There are no signs of pulmonary edema or focal pulmonary consolidation. There is no pleural effusion or pneumothorax. IMPRESSION: No active disease. Electronically Signed   By: Ernie Avena M.D.   On: 08/10/2022 14:10    Anti-infectives: Anti-infectives (From admission, onward)    Start     Dose/Rate Route Frequency Ordered Stop   08/11/22 1500  vancomycin (VANCOREADY) IVPB 750 mg/150 mL  Status:   Discontinued        750 mg 150 mL/hr over 60 Minutes Intravenous Every 24 hours 08/10/22 1340 08/11/22 0518   08/11/22 0518  vancomycin variable dose per unstable renal function (pharmacist dosing)  Status:  Discontinued         Does not apply See admin instructions 08/11/22 0518 08/11/22 0850   08/10/22 2200  piperacillin-tazobactam (ZOSYN) IVPB 3.375 g        3.375 g 12.5 mL/hr over 240 Minutes Intravenous Every 8 hours 08/10/22 1334     08/10/22 1430  piperacillin-tazobactam (ZOSYN) IVPB 3.375 g        3.375 g 100 mL/hr over 30 Minutes Intravenous  Once 08/10/22 1333 08/10/22 1451   08/10/22 1430  vancomycin (VANCOCIN) IVPB 1000 mg/200 mL premix        1,000 mg 200 mL/hr over 60 Minutes Intravenous  Once 08/10/22 1338 08/10/22 1507   08/07/22  1000  cefoTEtan (CEFOTAN) 2 g in sodium chloride 0.9 % 100 mL IVPB  Status:  Discontinued        2 g 200 mL/hr over 30 Minutes Intravenous On call to O.R. 08/07/22 0946 08/07/22 1005       Assessment/Plan: Colonic intussusception POD 5 s/p sigmoid colectomy/colostomy-LK   - surgical path benign - became ill POD 3, ct as expected postop, not hemorrhage, better with volume, will continue to follow, she is slow to clear lactate but clinically is better- I have no reason to suspect she has ongoing intraabdominal process right now that is the source of this FEN - sips of clears VTE - lovenox -appreciate ICU care   Emelia Loron 08/12/2022

## 2022-08-12 NOTE — Progress Notes (Signed)
eLink Physician-Brief Progress Note Patient Name: Sarah Moore DOB: 09-11-1950 MRN: [id number]   Date of Service  08/12/2022  HPI/Events of Note  Bedside RN check CVP on patient and it was 6.  Blood pressure is 126/72, heart rate is 85.  Urine output was 750 cc overnight. She wants to know if a fluid bolus is warranted for low CVP.  eICU Interventions  No indication for fluid bolus with patient showing good evidence of renal perfusion.  Clinically stable on camera exam.  Updated bedside RN.     Intervention Category Evaluation Type: Other  Carilyn Goodpasture 08/12/2022, 6:56 AM

## 2022-08-12 NOTE — Progress Notes (Addendum)
PROGRESS NOTE    Sarah Moore  ZOX:[id number] DOB: 27-Apr-1950 DOA: 08/06/2022 PCP: Norm Salt, PA    Brief Narrative:   Sarah Moore is a 72 y.o. female with past medical history significant for hyperlipidemia, chronic constipation, history of remote DVT not on anticoagulation who presented to [facility] ED on 4/22 with complaints of abdominal pain.  Patient reports last bowel movement 1.5 weeks ago.  Also endorses loss of appetite.  Patient was seen by urgent care 2 days prior to admission and was given Linzess and Fleet enema without improvement.  Denies nausea or vomiting.  No previous history of abdominal surgery.  Patient follows with GI outpatient, Dr. Elnoria Howard.  Recent flex sigmoidoscopy 4/9 that was reported as normal and colonoscopy February 2024 with only polyps noted.  At that time patient was advised to start taking lactulose.  In the ED, temperature 98.1 F, HR 85, RR 16, BP 123/106, SpO2 98% on room air.  WBC 7.1, hemoglobin 10.9, platelets 303.  Sodium 138, potassium 2.5, chloride 100, CO2 28, glucose 96, BUN 8, creatinine 0.36.  Lipase 24.  AST 14, ALT 10, total bili 1.3.  Urinalysis unrevealing.  CT abdomen/pelvis with colocolonic intussusception at the junction of the descending and sigmoid colon likely due to a large intraluminal lipoma serving as a lead point, mild mural thickening of the into the septum without frank evidence of bowel ischemia concerning for partial functional colonic obstruction with moderate gaseous distention of the proximal colon.  EDP discussed with GI, Dr. Loreta Ave who recommended general surgery consult.  Dr. Luisa Hart was consulted who advised maintain n.p.o. status and recommended hospitalist admission.  TRH consulted for admission for further evaluation management of large bowel obstruction.  Significant Hospital events: 4/22 admit 4/23 partial colectomy and end colostomy 4/24-4/25 nausea w/ clears 4/26 confused, hypotensive moved to ICU.  1.5 liters saline. Started on NE gtt. glucose 17 and treated. 4/27: Titrated off of Levophed and off of Bair hugger.  Started on bicarbonate drip. 4/28: Transferred back to hospitalist service, LFTs improved but T bili rising.  Obtaining RUQ ultrasound  Assessment & Plan:   Circulatory shock with lactic acidosis; undifferentiated: Resolved Metabolic acidosis: resolved On 08/10/2022, patient became confused notably hypotensive that was not responsive to IV fluid resuscitation.  Patient was transferred to the intensive care unit under the critical care service and started on vasopressors.  Lactic acid was greater than 9.  Chest x-ray unrevealing, CT abdomen/pelvis with no significant findings.  Consideration of severe volume depletion. -- Vasopressors now titrated off -- Lactic acid >9--->>3.7 -- Procalcitonin elevated 10.96 -- Continue Zosyn -- Discontinue sodium bicarb drip and change to D5NS with 20 mEq of KCl at 165mL/h -- Monitor BP closely, repeat lactic acid procalcitonin in a.m.  Large bowel obstruction 2/2 intraluminal colonic mass s/p Ex lap with partial colectomy and end colostomy The patient presented to ED with persistent abdominal pain.  History of chronic constipation, last reported bowel movement 1.5 weeks prior to admission.  Follows with GI outpatient, Dr. Elnoria Howard.  Patient is afebrile without leukocytosis.  CT abdomen/pelvis on admission with findings of colo-colonic intussusception at the junction of the descending and sigmoid colon likely due to a large intraluminal lipoma serving as a lead point, mild mural thickening of the into the septum without frank evidence of bowel ischemia concerning for partial functional colonic obstruction with moderate gaseous distention of the proximal colon.  General surgery was consulted and patient underwent ex lap with partial  colectomy with end colostomy by Dr. Alvan Dame on 08/07/2022.  Findings of large intraluminal mass of the distal left colon;  pathology with no dysplasia or malignancy. -- General Surgery following, appreciate assistance -- N.p.o. with sips of clears from floor stock -- Further per general surgery  Elevated LFTs likely secondary to shock liver Patient with notably elevated LFTs, likely secondary to shock liver from circulatory shock/hypotension from severe volume depletion.  Initially requiring vasopressors and significant IV fluid resuscitation.  Vasopressors now titrated off. -- AST 14>14>7482>5054>1862 -- ALT 10>9>3651>3363>2353 -- Total bilirubin 1.3>1.3>6.0>6.0>5.7>8.4 -- Check right upper quadrant ultrasound -- Avoid hepatotoxins -- LFTs daily  Acute hypoxic respiratory failure, not POA Patient yesterday now needing supplemental oxygen, up to 4 L nasal cannula.  Chest x-ray this morning with no focal consolidation.  Suspect etiology from aggressive IV fluid resuscitation, atelectasis. -- On IV antibiotics as above -- Incentive spirometry -- Continue supplemental oxygen, maintain SpO2 greater than 92%  Acute renal failure Etiology likely secondary to ATN from hypotension/circulatory shock as above. -- Cr 0.36>>0.57>1.18>1.26>1.32 -- Continue IV fluid hydration -- Avoid nephrotoxins -- Closely monitor urine output; Foley catheter placed -- Follow renal function daily  Elevated troponin Troponin elevated on 4/27 780 followed by 461. TTE 4/27 with LVEF 60 to 65%, no regional wall motion abnormalities.  Etiology likely secondary to type II demand ischemia in the setting of circulatory shock as above.  Denies chest pain.  --Continue to monitor on telemetry.  Questionable GI bleed Patient with large BM and ostomy this morning with dark tarry appearing stool. -- Hgb 10.9>9.8>9.5>8.6>7.6>8.2 -- Discontinue subcutaneous heparin today -- Protonix 40 mg IV every 12 hours -- Continue monitor H&H every 12 hours -- send hemocult -- CBC in a.m.  Hypokalemia Potassium this morning, 3.0, magnesium 2.8.  Will  continue repletion with IV and oral potassium. -- Repeat BMP in a.m. to include magnesium  Hyperlipidemia Currently not on statin outpatient.   DVT prophylaxis: SCDs Start: 08/06/22 1950    Code Status: Full Code Family Communication: No family present at bedside this morning  Disposition Plan:  Level of care: Stepdown Status is: Inpatient Remains inpatient appropriate because: Remains n.p.o., needs initiation of diet with advancement before stable for discharge, continues on IV antibiotics    Consultants:  General surgery PCCM  Procedures:  Ex lap with partial colectomy and end colostomy, Dr. Sheliah Hatch 4/23 Right IJ central line placed 4/26  Antimicrobials:  Perioperative cefotetan Zosyn 4/26>>   Subjective: Patient seen examined bedside, lying in bed.  RN present.  Patient with large bowel movement with some areas of concern for blood with dark tarry stool as well.  Patient was transferred back to the hospital service today now off of vasopressors.  Remains on IV antibiotics.  Patient is slow to respond but alert and orientated and appropriate.  AST/ALT improving but total bilirubin increased, suspect shock liver from hypotension but will check right upper quadrant ultrasound.  Will also check stool for blood and start IV Protonix.  Patient with no other questions or concerns at this time.  Denies headache, no dizziness, no chest pain, no shortness of breath, no significant abdominal pain, no fever, no nausea/vomiting.  No other acute concerns overnight per nursing staff.  Objective: Vitals:   08/12/22 0800 08/12/22 0815 08/12/22 0830 08/12/22 0900  BP: 136/80  104/66 122/74  Pulse: 81  82 82  Resp: 17  16 14   Temp: 98.1 F (36.7 C) 97.9 F (36.6 C) 97.9 F (36.6 C) 97.9  F (36.6 C)  TempSrc:   Bladder Bladder  SpO2: 94%  92% 93%  Weight:      Height:        Intake/Output Summary (Last 24 hours) at 08/12/2022 0948 Last data filed at 08/12/2022 7829 Gross per 24  hour  Intake 3034.34 ml  Output 990 ml  Net 2044.34 ml   Filed Weights   08/06/22 1020 08/07/22 1014 08/10/22 1107  Weight: 52.2 kg 52.2 kg 50.8 kg    Examination:  Physical Exam: GEN: NAD, alert and oriented to Place [facility]), Person (President: Jackquline Bosch), time (2024), elderly in appearance; ill in appearance HEENT: NCAT, PERRL, EOMI, sclera clear, dry mucous membranes PULM: CTAB w/o wheezes/crackles, normal respiratory effort, on 4 L nasal cannula CV: RRR w/o M/G/R GI: abd soft, noted abdominal surgical incision site with staples in place, clean/dry/intact without surrounding erythema/fluctuance, noted colostomy with large amount of brown solid stool along with slight dark tarry appearing stool that has punctured colostomy bag, + BS   MSK: no peripheral edema, moves all extremities independently with preserved muscle strength NEURO: No focal neurological deficits PSYCH: Depressed mood, flat affect Integumentary: Noted colostomy with midline abdominal surgical incision as above, otherwise no other concerning rashes/lesions/wounds noted on exposed skin surfaces    Data Reviewed: I have personally reviewed following labs and imaging studies  CBC: Recent Labs  Lab 08/06/22 1230 08/08/22 0536 08/09/22 0537 08/10/22 1010 08/11/22 0445 08/12/22 0622  WBC 7.1 11.6* 8.3 13.8* 12.3* 9.9  NEUTROABS 4.0  --   --   --   --   --   HGB 10.9* 9.8* 9.5* 8.6* 7.6* 8.2*  HCT 33.8* 30.4* 28.8* 29.2* 23.1* 24.7*  MCV 90.6 90.2 89.7 97.7 89.5 86.4  PLT 303 296 311 332 293 311   Basic Metabolic Panel: Recent Labs  Lab 08/08/22 0536 08/09/22 0537 08/10/22 0546 08/10/22 1740 08/11/22 0445 08/12/22 0622  NA 136 136 136 133* 134* 135  K 3.4* 2.6* 4.4 4.5 3.2* 3.0*  CL 100 99 103 103 98 89*  CO2 22 26 18* <7* 21* 34*  GLUCOSE 108* 176* 70 272* 228* 214*  BUN <5* 8 8 12 11 13   CREATININE 0.55 0.45 0.57 1.18* 1.26* 1.32*  CALCIUM 9.2 8.8* 8.9 8.2* 7.8* 7.2*  MG 2.0 2.0 2.0   --  1.7 2.8*   GFR: Estimated Creatinine Clearance: 28.1 mL/min (A) (by C-G formula based on SCr of 1.32 mg/dL (H)). Liver Function Tests: Recent Labs  Lab 08/06/22 1230 08/08/22 0536 08/10/22 1740 08/11/22 0445 08/12/22 0622  AST 14* 14* 7,482* 5,054* 1,862*  ALT 10 9 3,651* 3,363* 2,353*  ALKPHOS 55 51 142* 140* 144*  BILITOT 1.3* 1.3* 6.0* 5.7* 8.4*  PROT 7.2 6.3* 4.7* 4.2* 3.8*  ALBUMIN 3.3* 2.8* 2.1* 1.9* 1.6*   Recent Labs  Lab 08/06/22 1230 08/11/22 0915  LIPASE 24 44   No results for input(s): "AMMONIA" in the last 168 hours. Coagulation Profile: Recent Labs  Lab 08/12/22 0622  INR 3.6*   Cardiac Enzymes: Recent Labs  Lab 08/11/22 0915  CKTOTAL 31*  28*  CKMB 5.3*   BNP (last 3 results) No results for input(s): "PROBNP" in the last 8760 hours. HbA1C: No results for input(s): "HGBA1C" in the last 72 hours. CBG: Recent Labs  Lab 08/11/22 1605 08/11/22 1817 08/11/22 2018 08/12/22 0030 08/12/22 0359  GLUCAP 171* 190* 185* 179* 192*   Lipid Profile: No results for input(s): "CHOL", "HDL", "LDLCALC", "TRIG", "CHOLHDL", "LDLDIRECT" in  the last 72 hours. Thyroid Function Tests: No results for input(s): "TSH", "T4TOTAL", "FREET4", "T3FREE", "THYROIDAB" in the last 72 hours. Anemia Panel: No results for input(s): "VITAMINB12", "FOLATE", "FERRITIN", "TIBC", "IRON", "RETICCTPCT" in the last 72 hours. Sepsis Labs: Recent Labs  Lab 08/11/22 0915 08/11/22 1443 08/11/22 2205 08/12/22 0621 08/12/22 0622  PROCALCITON  --   --   --   --  10.96  LATICACIDVEN 7.1* 6.0* 4.4* 3.7*  --     Recent Results (from the past 240 hour(s))  MRSA Next Gen by PCR, Nasal     Status: None   Collection Time: 08/10/22 11:13 AM   Specimen: Nasal Mucosa; Nasal Swab  Result Value Ref Range Status   MRSA by PCR Next Gen NOT DETECTED NOT DETECTED Final    Comment: (NOTE) The GeneXpert MRSA Assay (FDA approved for NASAL specimens only), is one component of a comprehensive  MRSA colonization surveillance program. It is not intended to diagnose MRSA infection nor to guide or monitor treatment for MRSA infections. Test performance is not FDA approved in patients less than 41 years old. Performed at [facility], [address] [place], [place] [address]   Culture, blood (Routine X 2) w Reflex to ID Panel     Status: None (Preliminary result)   Collection Time: 08/10/22  3:30 PM   Specimen: BLOOD LEFT HAND  Result Value Ref Range Status   Specimen Description   Final    BLOOD LEFT HAND Performed at [facility], [address] [place], [place] [address]    Special Requests   Final    BOTTLES DRAWN AEROBIC ONLY Blood Culture adequate volume Performed at [facility], [address] [place], [place] [address]    Culture   Final    NO GROWTH 2 DAYS Performed at [facility], [address], [place], [place] [address]    Report Status PENDING  Incomplete  Culture, blood (Routine X 2) w Reflex to ID Panel     Status: None (Preliminary result)   Collection Time: 08/10/22  6:01 PM   Specimen: BLOOD LEFT HAND  Result Value Ref Range Status   Specimen Description   Final    BLOOD LEFT HAND Performed at [facility], [address] [place], [place] [address]    Special Requests   Final    BOTTLES DRAWN AEROBIC ONLY Blood Culture results may not be optimal due to an inadequate volume of blood received in culture bottles Performed at [facility], [address] [place], [place] [address]    Culture   Final    NO GROWTH 2 DAYS Performed at [facility], [address] [place], [place] [address]    Report Status PENDING  Incomplete         Radiology Studies: ECHOCARDIOGRAM COMPLETE  Result Date: 08/11/2022    ECHOCARDIOGRAM REPORT   Patient Name:   Sarah Moore Date of Exam: 08/11/2022 Medical Rec #:  [id number]     Height:       60.0 in Accession #:    [id number]     Weight:       112.0 lb Date of Birth:  1950-08-23    BSA:          1.459 m Patient Age:    71 years      BP:           116/66 mmHg Patient Gender: F  HR:           87 bpm. Exam Location:  Inpatient Procedure: 2D Echo, Cardiac Doppler and Color Doppler Indications:    Shock  History:        Patient has prior history of Echocardiogram examinations, most                 recent 08/01/2021. Risk Factors:Dyslipidemia.  Sonographer:    Wallie Char Referring Phys: [id number] MURALI RAMASWAMY IMPRESSIONS  1. Left ventricular ejection fraction, by estimation, is 60 to 65%. The left ventricle has normal function. The left ventricle has no regional wall motion abnormalities. Left ventricular diastolic parameters are consistent with Grade I diastolic dysfunction (impaired relaxation).  2. Right ventricular systolic function is normal. The right ventricular size is normal. There is moderately elevated pulmonary artery systolic pressure. The estimated right ventricular systolic pressure is 50.3 mmHg.  3. The mitral valve is normal in structure. No evidence of mitral valve regurgitation. No evidence of mitral stenosis.  4. The aortic valve is normal in structure. Aortic valve regurgitation is not visualized. No aortic stenosis is present.  5. The inferior vena cava is dilated in size with <50% respiratory variability, suggesting right atrial pressure of 15 mmHg. Comparison(s): Prior images reviewed side by side. The left ventricular function is unchanged. Increased right atrial pressure and pulmonary artery pressure is now seen, but right ventricular function remains normal. FINDINGS  Left Ventricle: Left ventricular ejection fraction, by estimation, is 60 to 65%. The left ventricle has normal function. The left ventricle has no regional wall motion abnormalities. The left ventricular internal cavity size was normal in size. There is  no left ventricular hypertrophy. Left ventricular diastolic parameters are consistent with  Grade I diastolic dysfunction (impaired relaxation). Indeterminate filling pressures. Right Ventricle: The right ventricular size is normal. No increase in right ventricular wall thickness. Right ventricular systolic function is normal. There is moderately elevated pulmonary artery systolic pressure. The tricuspid regurgitant velocity is 2.97 m/s, and with an assumed right atrial pressure of 15 mmHg, the estimated right ventricular systolic pressure is 50.3 mmHg. Left Atrium: Left atrial size was normal in size. Right Atrium: Right atrial size was normal in size. Pericardium: There is no evidence of pericardial effusion. Mitral Valve: The mitral valve is normal in structure. No evidence of mitral valve regurgitation. No evidence of mitral valve stenosis. MV peak gradient, 7.7 mmHg. The mean mitral valve gradient is 3.0 mmHg. Tricuspid Valve: The tricuspid valve is normal in structure. Tricuspid valve regurgitation is mild . No evidence of tricuspid stenosis. Aortic Valve: The aortic valve is normal in structure. Aortic valve regurgitation is not visualized. No aortic stenosis is present. Aortic valve mean gradient measures 4.0 mmHg. Aortic valve peak gradient measures 8.2 mmHg. Aortic valve area, by VTI measures 2.06 cm. Pulmonic Valve: The pulmonic valve was normal in structure. Pulmonic valve regurgitation is mild. No evidence of pulmonic stenosis. Aorta: The aortic root is normal in size and structure. Venous: The inferior vena cava is dilated in size with less than 50% respiratory variability, suggesting right atrial pressure of 15 mmHg. IAS/Shunts: No atrial level shunt detected by color flow Doppler.  LEFT VENTRICLE PLAX 2D LVIDd:         3.30 cm     Diastology LVIDs:         2.30 cm     LV e' medial:    7.19 cm/s LV PW:         1.10 cm  LV E/e' medial:  12.1 LV IVS:        1.10 cm     LV e' lateral:   8.97 cm/s LVOT diam:     1.90 cm     LV E/e' lateral: 9.7 LV SV:         51 LV SV Index:   35 LVOT Area:      2.84 cm  LV Volumes (MOD) LV vol d, MOD A2C: 57.6 ml LV vol d, MOD A4C: 68.6 ml LV vol s, MOD A2C: 26.1 ml LV vol s, MOD A4C: 27.8 ml LV SV MOD A2C:     31.5 ml LV SV MOD A4C:     68.6 ml LV SV MOD BP:      36.3 ml RIGHT VENTRICLE             IVC RV Basal diam:  3.00 cm     IVC diam: 2.50 cm RV S prime:     14.50 cm/s TAPSE (M-mode): 2.3 cm LEFT ATRIUM             Index        RIGHT ATRIUM           Index LA diam:        3.10 cm 2.12 cm/m   RA Area:     10.70 cm LA Vol (A2C):   23.2 ml 15.90 ml/m  RA Volume:   21.00 ml  14.39 ml/m LA Vol (A4C):   20.9 ml 14.33 ml/m LA Biplane Vol: 22.1 ml 15.15 ml/m  AORTIC VALVE AV Area (Vmax):    2.03 cm AV Area (Vmean):   1.97 cm AV Area (VTI):     2.06 cm AV Vmax:           143.50 cm/s AV Vmean:          97.150 cm/s AV VTI:            0.249 m AV Peak Grad:      8.2 mmHg AV Mean Grad:      4.0 mmHg LVOT Vmax:         102.50 cm/s LVOT Vmean:        67.550 cm/s LVOT VTI:          0.180 m LVOT/AV VTI ratio: 0.72  AORTA Ao Root diam: 2.90 cm Ao Asc diam:  3.10 cm MITRAL VALVE                TRICUSPID VALVE MV Area (PHT): 3.28 cm     TR Peak grad:   35.3 mmHg MV Area VTI:   1.89 cm     TR Vmax:        297.00 cm/s MV Peak grad:  7.7 mmHg MV Mean grad:  3.0 mmHg     SHUNTS MV Vmax:       1.39 m/s     Systemic VTI:  0.18 m MV Vmean:      77.6 cm/s    Systemic Diam: 1.90 cm MV Decel Time: 231 msec MV E velocity: 87.20 cm/s MV A velocity: 120.00 cm/s MV E/A ratio:  0.73 Mihai Croitoru MD Electronically signed by Thurmon Fair MD Signature Date/Time: 08/11/2022/11:18:35 AM    Final    CT ABDOMEN PELVIS W CONTRAST  Result Date: 08/10/2022 CLINICAL DATA:  72 year old with postop abdominal pain. History of colectomy and colostomy. EXAM: CT ABDOMEN AND PELVIS WITH CONTRAST TECHNIQUE: Multidetector CT imaging of the abdomen and pelvis was performed using the  standard protocol following bolus administration of intravenous contrast. RADIATION DOSE REDUCTION: This exam was  performed according to the departmental dose-optimization program which includes automated exposure control, adjustment of the mA and/or kV according to patient size and/or use of iterative reconstruction technique. CONTRAST:  80mL OMNIPAQUE IOHEXOL 300 MG/ML  SOLN COMPARISON:  CT abdomen and pelvis 08/06/2022 FINDINGS: Lower chest: 3 mm nodular density along the right major fissure on the image 16/6 was probably present in 2014 and suggestive for a benign finding. Otherwise, the lung bases are clear. Hepatobiliary: High-density material in the gallbladder without distension or inflammatory changes. Main portal venous system is patent. No biliary dilatation. Stable appearance of the liver. Patient has a hemangioma involving the left hepatic lobe which is poorly characterized on this exam and markedly decreased in size compared to the exam in 2014. Pancreas: Unremarkable. No pancreatic ductal dilatation or surrounding inflammatory changes. Spleen: Normal in size without focal abnormality. Adrenals/Urinary Tract: Normal adrenal glands. Normal appearance of both kidneys without hydronephrosis. Normal appearance of the urinary bladder. Stomach/Bowel: Partial colectomy with resection of at least a portion of the sigmoid colon. Patient has a Hartmann's pouch. There is a colostomy involving the descending colon. Mild distention of the stomach with air-fluid level. No small bowel distention or obstruction. Mild wall thickening along the proximal aspect of the Hartmann's pouch. There appears to be a normal appendix. Vascular/Lymphatic: Aorta at the hiatus measures up to 3.4 cm with eccentric plaque occupying approximately 50% of the lumen. Diffuse atherosclerotic calcifications involving the abdominal aorta. No significant lymph node enlargement in the abdomen or pelvis. Reproductive: Prostate is unremarkable. Other: Small amount of slightly dense fluid along the right side of the pelvis on image 63/2. Suspect this  represents postoperative fluid with blood products. Small air-fluid collections near the Hartmann's pouch on image 62/2 are likely associated with small bowel loops. Multiple pockets of gas along the lateral left abdomen and upper pelvic region are compatible with recent postoperative changes. Small amount of free air in the upper abdomen. No suspicious postoperative fluid collections. Mild mesenteric edema. Surgical skin staples along the anterior abdominal wall. Musculoskeletal: No acute bone abnormality. IMPRESSION: 1. Expected postoperative changes following a partial colectomy and colostomy creation. Multiple small pockets of air within the abdomen are compatible with recent surgery. Small amount of slightly dense fluid in the pelvis could represent blood products. No suspicious postoperative fluid collections at this time. 2. 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 Recommend follow-up ultrasound every 3 years. This recommendation follows ACR consensus guidelines: White Paper of the ACR Incidental Findings Committee II on Vascular Findings. J Am Coll Radiol 2013; 16:109-604. 3.  Aortic Atherosclerosis (ICD10-I70.0). Electronically Signed   By: Richarda Overlie M.D.   On: 08/10/2022 17:23   DG CHEST PORT 1 VIEW  Result Date: 08/10/2022 CLINICAL DATA:  Central line placement. EXAM: PORTABLE CHEST 1 VIEW COMPARISON:  One-view chest x-ray 08/10/2022 at 1:55 p.m. FINDINGS: Heart size is normal. A right IJ line has been placed. The tip is at the. Rule junction. No pneumothorax is present. Lung volumes are low. No edema or effusion is present. No focal airspace disease is present. The visualized soft tissues and bony thorax are unremarkable. IMPRESSION: Interval placement of right IJ line. The tip is at the cavoatrial junction. No pneumothorax. Electronically Signed   By: Marin Roberts M.D.   On: 08/10/2022 16:22   DG CHEST PORT 1 VIEW  Result Date: 08/10/2022 CLINICAL DATA:   Respiratory  failure EXAM: PORTABLE CHEST 1 VIEW COMPARISON:  05/13/2021 FINDINGS: Cardiac size is within normal limits. Thoracic aorta is ectatic. There are no signs of pulmonary edema or focal pulmonary consolidation. There is no pleural effusion or pneumothorax. IMPRESSION: No active disease. Electronically Signed   By: Ernie Avena M.D.   On: 08/10/2022 14:10        Scheduled Meds:  acetaminophen  1,000 mg Oral Q6H   Chlorhexidine Gluconate Cloth  6 each Topical Daily   pantoprazole (PROTONIX) IV  40 mg Intravenous Q12H   Continuous Infusions:  sodium chloride Stopped (08/10/22 2129)   dextrose 5 % and 0.9 % NaCl with KCl 20 mEq/L 100 mL/hr at 08/12/22 0852   piperacillin-tazobactam (ZOSYN)  IV 12.5 mL/hr at 08/12/22 1610   potassium chloride       LOS: 6 days    Time spent: 60 minutes spent on chart review, discussion with nursing staff, consultants, updating family and interview/physical exam; more than 50% of that time was spent in counseling and/or coordination of care.    Alvira Philips [place], DO [facility] Available via Epic secure chat 7am-7pm After these hours, please refer to coverage provider listed on [email or web address] 08/12/2022, 9:48 AM

## 2022-08-13 ENCOUNTER — Ambulatory Visit (HOSPITAL_COMMUNITY): Payer: Medicare HMO

## 2022-08-13 ENCOUNTER — Inpatient Hospital Stay (HOSPITAL_COMMUNITY): Payer: Medicare HMO

## 2022-08-13 ENCOUNTER — Encounter (HOSPITAL_COMMUNITY): Payer: Self-pay | Admitting: Family Medicine

## 2022-08-13 ENCOUNTER — Encounter (HOSPITAL_COMMUNITY): Payer: Self-pay

## 2022-08-13 DIAGNOSIS — I6523 Occlusion and stenosis of bilateral carotid arteries: Secondary | ICD-10-CM | POA: Diagnosis not present

## 2022-08-13 DIAGNOSIS — R29818 Other symptoms and signs involving the nervous system: Secondary | ICD-10-CM | POA: Diagnosis not present

## 2022-08-13 DIAGNOSIS — I639 Cerebral infarction, unspecified: Secondary | ICD-10-CM | POA: Diagnosis not present

## 2022-08-13 DIAGNOSIS — K561 Intussusception: Secondary | ICD-10-CM | POA: Diagnosis not present

## 2022-08-13 LAB — COMPREHENSIVE METABOLIC PANEL
ALT: 1839 U/L — ABNORMAL HIGH (ref 0–44)
ALT: 1619 U/L — ABNORMAL HIGH (ref 0–44)
AST: 581 U/L — ABNORMAL HIGH (ref 15–41)
AST: 357 U/L — ABNORMAL HIGH (ref 15–41)
Albumin: 1.7 g/dL — ABNORMAL LOW (ref 3.5–5.0)
Albumin: 1.6 g/dL — ABNORMAL LOW (ref 3.5–5.0)
Alkaline Phosphatase: 139 U/L — ABNORMAL HIGH (ref 38–126)
Alkaline Phosphatase: 135 U/L — ABNORMAL HIGH (ref 38–126)
Anion gap: 4 — ABNORMAL LOW (ref 5–15)
Anion gap: 7 (ref 5–15)
BUN: 18 mg/dL (ref 8–23)
BUN: 23 mg/dL (ref 8–23)
CO2: 35 mmol/L — ABNORMAL HIGH (ref 22–32)
CO2: 31 mmol/L (ref 22–32)
Calcium: 7.6 mg/dL — ABNORMAL LOW (ref 8.9–10.3)
Calcium: 8 mg/dL — ABNORMAL LOW (ref 8.9–10.3)
Chloride: 99 mmol/L (ref 98–111)
Chloride: 102 mmol/L (ref 98–111)
Creatinine, Ser: 1.08 mg/dL — ABNORMAL HIGH (ref 0.44–1.00)
Creatinine, Ser: 0.85 mg/dL (ref 0.44–1.00)
GFR, Estimated: 55 mL/min — ABNORMAL LOW (ref >60)
GFR, Estimated: >60 mL/min (ref >60)
Glucose, Bld: 169 mg/dL — ABNORMAL HIGH (ref 70–99)
Glucose, Bld: 135 mg/dL — ABNORMAL HIGH (ref 70–99)
Potassium: 4.2 mmol/L (ref 3.5–5.1)
Potassium: 4.3 mmol/L (ref 3.5–5.1)
Sodium: 138 mmol/L (ref 135–145)
Sodium: 140 mmol/L (ref 135–145)
Total Bilirubin: 14.7 mg/dL — ABNORMAL HIGH (ref 0.3–1.2)
Total Bilirubin: 17.7 mg/dL — ABNORMAL HIGH (ref 0.3–1.2)
Total Protein: 4 g/dL — ABNORMAL LOW (ref 6.5–8.1)
Total Protein: 4 g/dL — ABNORMAL LOW (ref 6.5–8.1)

## 2022-08-13 LAB — CBC
HCT: 25 % — ABNORMAL LOW (ref 36.0–46.0)
HCT: 22.9 % — ABNORMAL LOW (ref 36.0–46.0)
Hemoglobin: 8.2 g/dL — ABNORMAL LOW (ref 12.0–15.0)
Hemoglobin: 7.5 g/dL — ABNORMAL LOW (ref 12.0–15.0)
MCH: 29.1 pg (ref 26.0–34.0)
MCH: 29.4 pg (ref 26.0–34.0)
MCHC: 32.8 g/dL (ref 30.0–36.0)
MCHC: 32.8 g/dL (ref 30.0–36.0)
MCV: 88.7 fL (ref 80.0–100.0)
MCV: 89.8 fL (ref 80.0–100.0)
Platelets: 314 10*3/uL (ref 150–400)
Platelets: 317 10*3/uL (ref 150–400)
RBC: 2.82 MIL/uL — ABNORMAL LOW (ref 3.87–5.11)
RBC: 2.55 MIL/uL — ABNORMAL LOW (ref 3.87–5.11)
RDW: 14 % (ref 11.5–15.5)
RDW: 14.4 % (ref 11.5–15.5)
WBC: 16.7 10*3/uL — ABNORMAL HIGH (ref 4.0–10.5)
WBC: 17.9 10*3/uL — ABNORMAL HIGH (ref 4.0–10.5)
nRBC: 1.2 % — ABNORMAL HIGH (ref 0.0–0.2)
nRBC: 0.8 % — ABNORMAL HIGH (ref 0.0–0.2)

## 2022-08-13 LAB — CULTURE, BLOOD (ROUTINE X 2)

## 2022-08-13 LAB — GLUCOSE, CAPILLARY
Glucose-Capillary: 133 mg/dL — ABNORMAL HIGH (ref 70–99)
Glucose-Capillary: 136 mg/dL — ABNORMAL HIGH (ref 70–99)
Glucose-Capillary: 155 mg/dL — ABNORMAL HIGH (ref 70–99)
Glucose-Capillary: 156 mg/dL — ABNORMAL HIGH (ref 70–99)
Glucose-Capillary: 161 mg/dL — ABNORMAL HIGH (ref 70–99)
Glucose-Capillary: 165 mg/dL — ABNORMAL HIGH (ref 70–99)
Glucose-Capillary: 177 mg/dL — ABNORMAL HIGH (ref 70–99)

## 2022-08-13 LAB — PROTIME-INR
INR: 1.9 — ABNORMAL HIGH (ref 0.8–1.2)
Prothrombin Time: 21.8 seconds — ABNORMAL HIGH (ref 11.4–15.2)

## 2022-08-13 LAB — LACTIC ACID, PLASMA: Lactic Acid, Venous: 2.3 mmol/L (ref 0.5–1.9)

## 2022-08-13 LAB — AMMONIA: Ammonia: 95 umol/L — ABNORMAL HIGH (ref 9–35)

## 2022-08-13 LAB — PROCALCITONIN: Procalcitonin: 6.41 ng/mL

## 2022-08-13 LAB — MAGNESIUM: Magnesium: 2.4 mg/dL (ref 1.7–2.4)

## 2022-08-13 MED ORDER — HYDRALAZINE HCL 20 MG/ML IJ SOLN: 5.0000 mg | Freq: Four times a day (QID) | INTRAMUSCULAR | Status: DC | PRN

## 2022-08-13 MED ORDER — IOHEXOL 350 MG/ML SOLN
75.0000 mL | Freq: Once | INTRAVENOUS | Status: AC | PRN
  Administered 2022-08-13: 100 mL via INTRAVENOUS

## 2022-08-13 MED ORDER — OXYCODONE HCL 5 MG PO TABS: 5.0000 mg | ORAL_TABLET | Freq: Four times a day (QID) | ORAL | Status: DC | PRN

## 2022-08-13 MED ORDER — HYDRALAZINE HCL 20 MG/ML IJ SOLN
INTRAMUSCULAR | Status: AC
  Administered 2022-08-13: 5 mg via INTRAVENOUS
  Filled 2022-08-13: qty 1

## 2022-08-13 MED ORDER — LACTULOSE 10 GM/15ML PO SOLN
20.0000 g | Freq: Two times a day (BID) | ORAL | Status: DC
  Filled 2022-08-13: qty 30

## 2022-08-13 MED ORDER — SODIUM CHLORIDE 0.9 % IV BOLUS
1000.0000 mL | Freq: Once | INTRAVENOUS | Status: AC
  Administered 2022-08-13: 1000 mL via INTRAVENOUS

## 2022-08-13 MED ORDER — METOPROLOL TARTRATE 5 MG/5ML IV SOLN
INTRAVENOUS | Status: AC
  Administered 2022-08-13: 5 mg via INTRAVENOUS
  Filled 2022-08-13: qty 5

## 2022-08-13 MED ORDER — SODIUM CHLORIDE (PF) 0.9 % IJ SOLN
INTRAMUSCULAR | Status: AC
  Filled 2022-08-13: qty 100

## 2022-08-13 MED ORDER — METOPROLOL TARTRATE 5 MG/5ML IV SOLN: 5.0000 mg | Freq: Once | INTRAVENOUS | Status: AC

## 2022-08-13 NOTE — Consult Note (Signed)
Code stroke activated at 1628. Report received by primary RN.   Cart taken to CT with pt at 1635.  Dr Otelia Limes paged at [phone].   Dr Otelia Limes on camera at [phone].

## 2022-08-13 NOTE — Evaluation (Signed)
Clinical/Bedside Swallow Evaluation Patient Details  Name: Sarah Moore MRN: [id number] Date of Birth: 1950-08-04  Today's Date: 08/13/2022 Time: SLP Start Time (ACUTE ONLY): 1110 SLP Stop Time (ACUTE ONLY): 1125 SLP Time Calculation (min) (ACUTE ONLY): 15 min  Past Medical History:  Past Medical History:  Diagnosis Date   Acid reflux    Arthritis    Chest pain    Fatigue    GERD (gastroesophageal reflux disease)    Hyperlipidemia    Past Surgical History:  Past Surgical History:  Procedure Laterality Date   ABDOMINAL HYSTERECTOMY     COLONOSCOPY     KNEE ARTHROPLASTY Left    LAPAROTOMY N/A 08/07/2022   Procedure: EXPLORATORY LAPAROTOMY, sigmoid colectomy WITH COLOSTOMY;  Surgeon: Kinsinger, De Blanch, MD;  Location: [facility];  Service: General;  Laterality: N/A;   HPI:  Patient is a 72 y.o. female with PMH: remote DVT not on anticoagulation, HLD, chronic constipation, GERD. She presented to the hospital on 08/06/22 with c/o abdominal pain and constipation. In ED, CT abd/pelvis revealed colo-colic intussusception beginning at the junction of the descending and sigmoid colon. She undewent exploratory laparotomy, sigmoid colectomy with colostomy on 08/07/22. SLP swallow evaluation ordered with surgery recommending to not advance diet beyond clears.    Assessment / Plan / Recommendation  Clinical Impression  Patient currently presenting with a suspected reversable dysphagia as per this bedside swallow evaluation. She has no h/o oropharyngeal dysphagia as per chart review and speaking with her daughter who was in the room. (She does have h/o GERD) Patient was lethargic but this improved after HOB raised and with verbal cues from both SLP and her daughter. She was able to drink thin liquids (water) via straw sips with initially having difficulty drawing liquid through straw but improving to be able to do it with some verbal cues. SLP is recommending initiate clear liquids diet with full  supervision and plan to advance as tolerated and as per GI recommendations. SLP Visit Diagnosis: Dysphagia, unspecified (R13.10)    Aspiration Risk  Mild aspiration risk    Diet Recommendation Thin liquid   Liquid Administration via: Cup;Straw Medication Administration: Other (Comment) (as per GI recommendations) Supervision: Full supervision/cueing for compensatory strategies;Staff to assist with self feeding Compensations: Slow rate;Small sips/bites Postural Changes: Seated upright at 90 degrees    Other  Recommendations Oral Care Recommendations: Oral care BID;Staff/trained caregiver to provide oral care    Recommendations for follow up therapy are one component of a multi-disciplinary discharge planning process, led by the attending physician.  Recommendations may be updated based on patient status, additional functional criteria and insurance authorization.  Follow up Recommendations Other (comment) (TBD)      Assistance Recommended at Discharge    Functional Status Assessment Patient has had a recent decline in their functional status and demonstrates the ability to make significant improvements in function in a reasonable and predictable amount of time.  Frequency and Duration min 2x/week  2 weeks       Prognosis Prognosis for improved oropharyngeal function: Good      Swallow Study   General Date of Onset: 08/06/22 HPI: Patient is a 72 y.o. female with PMH: remote DVT not on anticoagulation, HLD, chronic constipation, GERD. She presented to the hospital on 08/06/22 with c/o abdominal pain and constipation. In ED, CT abd/pelvis revealed colo-colic intussusception beginning at the junction of the descending and sigmoid colon. She undewent exploratory laparotomy, sigmoid colectomy with colostomy on 08/07/22. SLP swallow evaluation ordered with surgery  recommending to not advance diet beyond clears. Type of Study: Bedside Swallow Evaluation Previous Swallow Assessment: none  found Diet Prior to this Study: NPO Temperature Spikes Noted: No Respiratory Status: Nasal cannula History of Recent Intubation: Yes Total duration of intubation (days):  (for surgery only) Behavior/Cognition: Alert;Cooperative;Lethargic/Drowsy Oral Cavity Assessment: Dry Oral Care Completed by SLP: Yes Oral Cavity - Dentition: Adequate natural dentition Self-Feeding Abilities: Total assist Patient Positioning: Upright in bed Baseline Vocal Quality: Low vocal intensity Volitional Cough: Weak Volitional Swallow: Able to elicit    Oral/Motor/Sensory Function Overall Oral Motor/Sensory Function: Within functional limits   Ice Chips     Thin Liquid Thin Liquid: Impaired Presentation: Straw Oral Phase Impairments: Reduced labial seal Pharyngeal  Phase Impairments: Other (comments) (no overt s/s)    Nectar Thick     Honey Thick     Puree Puree: Not tested   Solid     Solid: Not tested      Angela Nevin, MA, CCC-SLP Speech Therapy

## 2022-08-13 NOTE — Progress Notes (Signed)
PT Cancellation Note  Patient Details Name: GEMA RINGOLD MRN: [id number] DOB: 13-Sep-1950   Cancelled Treatment:    Reason Eval/Treat Not Completed: Fatigue/lethargy limiting ability to participate (pt sleeping soundly, unable to arouse pt enough for her to follow commands, per RN pt just received IV pain medication that makes her very sleepy. Will follow.)   Tamala Ser PT 08/13/2022  Acute Rehabilitation Services  Office [phone]

## 2022-08-13 NOTE — Progress Notes (Signed)
Called by bedside RN due to change in mental status and possible stroke. Right facial droop, bilateral weakness and aphasia noted on assessment. Unable to follow commands and only responding "Ok" to questions. [place] MD notified and at bedside when code stroke initiated. Patient transported to CT and back with tele neuro cart and bedside RN

## 2022-08-13 NOTE — Consult Note (Signed)
WOC Nurse ostomy follow up Stoma type/location: LUQ, end colostomy Stomal assessment/size: oval, retracted, pink, moist Pouch changed by ICU staff prior to my arrival Peristomal assessment: NA Treatment options for stomal/peristomal skin: barrier rings Output liquid green Ostomy pouching: 1pc. Convex used, rather than soft convex, not sure why.  Soft convex pouches are in the room Education provided: patient is minimally responsive, can not engage with WOC nurse. Jaundiced. Education not appropriate at this time. No family in the room  Enrolled patient in [place] Secure Start Discharge program: Yes  WOC Nurse will follow along with you for continued support with ostomy teaching and care Calee Nugent [facility] MSN, RN, [place], CNS, [place] [phone]

## 2022-08-13 NOTE — Progress Notes (Signed)
PROGRESS NOTE    Sarah Moore  ZOX:[id number] DOB: 01/13/1951 DOA: 08/06/2022 PCP: Norm Salt, PA    Brief Narrative:   Sarah Moore is a 72 y.o. female with past medical history significant for hyperlipidemia, chronic constipation, history of remote DVT not on anticoagulation who presented to [facility] ED on 4/22 with complaints of abdominal pain.  Patient reports last bowel movement 1.5 weeks ago.  Also endorses loss of appetite.  Patient was seen by urgent care 2 days prior to admission and was given Linzess and Fleet enema without improvement.  Denies nausea or vomiting.  No previous history of abdominal surgery.  Patient follows with GI outpatient, Dr. Elnoria Howard.  Recent flex sigmoidoscopy 4/9 that was reported as normal and colonoscopy February 2024 with only polyps noted.  At that time patient was advised to start taking lactulose.  In the ED, temperature 98.1 F, HR 85, RR 16, BP 123/106, SpO2 98% on room air.  WBC 7.1, hemoglobin 10.9, platelets 303.  Sodium 138, potassium 2.5, chloride 100, CO2 28, glucose 96, BUN 8, creatinine 0.36.  Lipase 24.  AST 14, ALT 10, total bili 1.3.  Urinalysis unrevealing.  CT abdomen/pelvis with colocolonic intussusception at the junction of the descending and sigmoid colon likely due to a large intraluminal lipoma serving as a lead point, mild mural thickening of the into the septum without frank evidence of bowel ischemia concerning for partial functional colonic obstruction with moderate gaseous distention of the proximal colon.  EDP discussed with GI, Dr. Loreta Ave who recommended general surgery consult.  Dr. Luisa Hart was consulted who advised maintain n.p.o. status and recommended hospitalist admission.  TRH consulted for admission for further evaluation management of large bowel obstruction.  Significant Hospital events: 4/22 admit 4/23 partial colectomy and end colostomy 4/24-4/25 nausea w/ clears 4/26 confused, hypotensive moved to ICU.  1.5 liters saline. Started on NE gtt. glucose 17 and treated. 4/27: Titrated off of Levophed and off of Bair hugger.  Started on bicarbonate drip. 4/28: Transferred back to hospitalist service, LFTs improved but T bili rising.  Obtaining RUQ ultrasound 4/29: Lethargic, slow to respond, AST/ALT improving, T. bili rising.  RUQ ultrasound yesterday unrevealing; general surgery to attempt clear liquid diet today  Assessment & Plan:   Circulatory shock with lactic acidosis; undifferentiated: Resolved Metabolic acidosis: resolved On 08/10/2022, patient became confused notably hypotensive that was not responsive to IV fluid resuscitation.  Patient was transferred to the intensive care unit under the critical care service and started on vasopressors.  Lactic acid was greater than 9.  Chest x-ray unrevealing, CT abdomen/pelvis with no significant findings on 4/26.  Consideration of severe volume depletion versus unclear infectious process. -- Vasopressors now titrated off, BP appropriate -- Lactic acid >9--->>3.7>2.3 -- Procalcitonin 10.96>6.61 -- Continue Zosyn -- Continue D5NS with 20 mEq of KCl at 172mL/h -- Monitor BP closely, repeat lactic acid procalcitonin in a.m.  Large bowel obstruction 2/2 intraluminal colonic mass s/p Ex lap with partial colectomy and end colostomy The patient presented to ED with persistent abdominal pain.  History of chronic constipation, last reported bowel movement 1.5 weeks prior to admission.  Follows with GI outpatient, Dr. Elnoria Howard.  Patient is afebrile without leukocytosis.  CT abdomen/pelvis on admission with findings of colo-colonic intussusception at the junction of the descending and sigmoid colon likely due to a large intraluminal lipoma serving as a lead point, mild mural thickening of the into the septum without frank evidence of bowel ischemia concerning for  partial functional colonic obstruction with moderate gaseous distention of the proximal colon.  General surgery  was consulted and patient underwent ex lap with partial colectomy with end colostomy by Dr. Alvan Dame on 08/07/2022.  Findings of large intraluminal mass of the distal left colon; pathology with no dysplasia or malignancy.  Repeat CT abdomen/pelvis 4/26 with expected postoperative changes with no acute findings. -- General Surgery following, appreciate assistance -- N.p.o. with sips of clears from floor stock; SLP evaluation if passes surgery plans to initiate clear liquid diet today -- Further per general surgery  Elevated LFTs likely secondary to shock liver Patient with notably elevated LFTs, likely secondary to shock liver from circulatory shock/hypotension from severe volume depletion.  Initially requiring vasopressors and significant IV fluid resuscitation.  Vasopressors now titrated off. Right upper quadrant ultrasound with sludge in gallbladder, gallbladder wall borderline, no stones, known hemangioma left hepatic lobe with steatosis, common bile duct 4 mm. -- AST 14>14>7482>5054>1862>581 -- ALT 10>9>3651>3363>2353>1839 -- Total bilirubin 1.3>1.3>6.0>6.0>5.7>8.4> 14.7 -- Avoid hepatotoxins -- LFTs daily  Acute hypoxic respiratory failure, not POA Patient yesterday now needing supplemental oxygen, up to 4 L nasal cannula.  Chest x-ray this morning with no focal consolidation.  Suspect etiology from aggressive IV fluid resuscitation, atelectasis. -- On IV antibiotics as above -- Incentive spirometry -- Continue supplemental oxygen, maintain SpO2 greater than 92%  Acute renal failure Etiology likely secondary to ATN from hypotension/circulatory shock as above. -- Cr 0.36>>0.57>1.18>1.26>1.32>1.08 -- Continue IV fluid hydration -- Avoid nephrotoxins -- Closely monitor urine output; Foley catheter placed -- Follow renal function daily  Elevated troponin Troponin elevated on 4/27 780 followed by 461. TTE 4/27 with LVEF 60 to 65%, no regional wall motion abnormalities.  Etiology likely  secondary to type II demand ischemia in the setting of circulatory shock as above.  Denies chest pain.  -- Continue to monitor on telemetry.  Questionable GI bleed Patient with large BM in ostomy on 4/28 with dark tarry appearing stool. -- Hgb 10.9>9.8>9.5>8.6>7.6>8.2>8.2 -- Discontinue subcutaneous heparin today -- Protonix 40 mg IV every 12 hours -- Continue monitor H&H every 12 hours -- CBC in a.m.  Hypokalemia Potassium this morning, 3.0, magnesium 2.8.  Will continue repletion with IV and oral potassium. -- Repeat BMP in a.m. to include magnesium  Hyperlipidemia Currently not on statin outpatient.   DVT prophylaxis: SCDs Start: 08/06/22 1950    Code Status: Full Code Family Communication: No family present at bedside this morning  Disposition Plan:  Level of care: Stepdown Status is: Inpatient Remains inpatient appropriate because: Remains n.p.o., general surgery ordered SLP evaluation and intends to start clear liquid diet today if passes screening; remains very ill, anticipate prolonged hospitalization    Consultants:  General surgery PCCM  Procedures:  Ex lap with partial colectomy and end colostomy, Dr. Sheliah Hatch 4/23 Right IJ central line placed 4/26  Antimicrobials:  Perioperative cefotetan Zosyn 4/26>>   Subjective: Patient seen examined bedside, lying in bed.  No family present this morning, daughter updated at bedside yesterday afternoon.  Continues to be mildly confused, slow to respond and lethargic.  AST/ALT improving but total bilirubin increasing; although right upper quadrant ultrasound yesterday unrevealing.  Remains on IV antibiotics.  Discussed with general surgery this morning, ordering SLP evaluation and if passes swallowing screen will initiate clear liquid diet.  Patient with no other questions or concerns at this time.  Denies headache, no chest pain, no shortness of breath, no significant abdominal pain, no fever, no nausea/vomiting.  No other  acute concerns overnight per nursing staff.  Objective: Vitals:   08/13/22 0600 08/13/22 0700 08/13/22 0800 08/13/22 0900  BP: 130/65 (!) 140/73 (!) 140/72 (!) 143/73  Pulse: 88 85 86 81  Resp: 14 13 13 12   Temp: 98.2 F (36.8 C) 98.2 F (36.8 C) 98.2 F (36.8 C) 98.4 F (36.9 C)  TempSrc:   Core   SpO2: 95% 95% 95% 95%  Weight:      Height:        Intake/Output Summary (Last 24 hours) at 08/13/2022 1030 Last data filed at 08/12/2022 1854 Gross per 24 hour  Intake 1074.96 ml  Output 510 ml  Net 564.96 ml   Filed Weights   08/06/22 1020 08/07/22 1014 08/10/22 1107  Weight: 52.2 kg 52.2 kg 50.8 kg    Examination:  Physical Exam: GEN: NAD, alert and oriented to Place [facility]), Person (President: Jackquline Bosch), but not to time [facility]), elderly/ill in appearance; ill in appearance, slow to respond, lethargic HEENT: NCAT, PERRL, EOMI, scleral icterus, dry mucous membranes PULM: CTAB w/o wheezes/crackles, normal respiratory effort, on 4 L nasal cannula CV: RRR w/o M/G/R GI: abd soft, noted abdominal surgical incision site with staples in place, clean/dry/intact without surrounding erythema/fluctuance, noted colostomy with small amount of liquid stool in collection bag, + BS   MSK: no peripheral edema, moves all extremities independently with preserved muscle strength NEURO: No focal neurological deficits PSYCH: Depressed mood, flat affect Integumentary: Noted colostomy with midline abdominal surgical incision as above, otherwise no other concerning rashes/lesions/wounds noted on exposed skin surfaces    Data Reviewed: I have personally reviewed following labs and imaging studies  CBC: Recent Labs  Lab 08/06/22 1230 08/08/22 0536 08/09/22 0537 08/10/22 1010 08/11/22 0445 08/12/22 0622 08/12/22 1701 08/13/22 0436  WBC 7.1   < > 8.3 13.8* 12.3* 9.9  --  16.7*  NEUTROABS 4.0  --   --   --   --   --   --   --   HGB 10.9*   < > 9.5* 8.6* 7.6* 8.2* 8.9* 8.2*   HCT 33.8*   < > 28.8* 29.2* 23.1* 24.7* 26.4* 25.0*  MCV 90.6   < > 89.7 97.7 89.5 86.4  --  88.7  PLT 303   < > 311 332 293 311  --  314   < > = values in this interval not displayed.   Basic Metabolic Panel: Recent Labs  Lab 08/09/22 0537 08/10/22 0546 08/10/22 1740 08/11/22 0445 08/12/22 0622 08/13/22 0436  NA 136 136 133* 134* 135 138  K 2.6* 4.4 4.5 3.2* 3.0* 4.2  CL 99 103 103 98 89* 99  CO2 26 18* <7* 21* 34* 35*  GLUCOSE 176* 70 272* 228* 214* 169*  BUN 8 8 12 11 13 18   CREATININE 0.45 0.57 1.18* 1.26* 1.32* 1.08*  CALCIUM 8.8* 8.9 8.2* 7.8* 7.2* 7.6*  MG 2.0 2.0  --  1.7 2.8* 2.4   GFR: Estimated Creatinine Clearance: 34.3 mL/min (A) (by C-G formula based on SCr of 1.08 mg/dL (H)). Liver Function Tests: Recent Labs  Lab 08/08/22 0536 08/10/22 1740 08/11/22 0445 08/12/22 0622 08/13/22 0436  AST 14* 7,482* 5,054* 1,862* 581*  ALT 9 3,651* 3,363* 2,353* 1,839*  ALKPHOS 51 142* 140* 144* 139*  BILITOT 1.3* 6.0* 5.7* 8.4* 14.7*  PROT 6.3* 4.7* 4.2* 3.8* 4.0*  ALBUMIN 2.8* 2.1* 1.9* 1.6* 1.7*   Recent Labs  Lab 08/06/22 1230 08/11/22 0915  LIPASE 24 44  No results for input(s): "AMMONIA" in the last 168 hours. Coagulation Profile: Recent Labs  Lab 08/12/22 0622  INR 3.6*   Cardiac Enzymes: Recent Labs  Lab 08/11/22 0915 08/12/22 0622  CKTOTAL 31*  28* 21*  CKMB 5.3* 2.2   BNP (last 3 results) No results for input(s): "PROBNP" in the last 8760 hours. HbA1C: No results for input(s): "HGBA1C" in the last 72 hours. CBG: Recent Labs  Lab 08/12/22 1544 08/12/22 1936 08/13/22 0005 08/13/22 0413 08/13/22 0828  GLUCAP 141* 158* 177* 156* 155*   Lipid Profile: No results for input(s): "CHOL", "HDL", "LDLCALC", "TRIG", "CHOLHDL", "LDLDIRECT" in the last 72 hours. Thyroid Function Tests: No results for input(s): "TSH", "T4TOTAL", "FREET4", "T3FREE", "THYROIDAB" in the last 72 hours. Anemia Panel: No results for input(s): "VITAMINB12",  "FOLATE", "FERRITIN", "TIBC", "IRON", "RETICCTPCT" in the last 72 hours. Sepsis Labs: Recent Labs  Lab 08/11/22 1443 08/11/22 2205 08/12/22 1324 08/12/22 0622 08/13/22 0436 08/13/22 0438  PROCALCITON  --   --   --  10.96 6.41  --   LATICACIDVEN 6.0* 4.4* 3.7*  --   --  2.3*    Recent Results (from the past 240 hour(s))  MRSA Next Gen by PCR, Nasal     Status: None   Collection Time: 08/10/22 11:13 AM   Specimen: Nasal Mucosa; Nasal Swab  Result Value Ref Range Status   MRSA by PCR Next Gen NOT DETECTED NOT DETECTED Final    Comment: (NOTE) The GeneXpert MRSA Assay (FDA approved for NASAL specimens only), is one component of a comprehensive MRSA colonization surveillance program. It is not intended to diagnose MRSA infection nor to guide or monitor treatment for MRSA infections. Test performance is not FDA approved in patients less than 80 years old. Performed at [facility], [address] [place], [place] [address]   Culture, blood (Routine X 2) w Reflex to ID Panel     Status: None (Preliminary result)   Collection Time: 08/10/22  3:30 PM   Specimen: BLOOD LEFT HAND  Result Value Ref Range Status   Specimen Description   Final    BLOOD LEFT HAND Performed at [facility], [address] [place], [place] [address]    Special Requests   Final    BOTTLES DRAWN AEROBIC ONLY Blood Culture adequate volume Performed at [facility], [address], [place], [place] [address]    Culture   Final    NO GROWTH 3 DAYS Performed at [facility], [address], [place], [place] [address]    Report Status PENDING  Incomplete  Culture, blood (Routine X 2) w Reflex to ID Panel     Status: None (Preliminary result)   Collection Time: 08/10/22  6:01 PM   Specimen: BLOOD LEFT HAND  Result Value Ref Range Status   Specimen Description   Final    BLOOD LEFT HAND Performed at [facility], [address] [place], [place]  [address]    Special Requests   Final    BOTTLES DRAWN AEROBIC ONLY Blood Culture results may not be optimal due to an inadequate volume of blood received in culture bottles Performed at [facility], [address] [place], [place] [address]    Culture   Final    NO GROWTH 3 DAYS Performed at [facility], [address], [place], [place] [address]    Report Status PENDING  Incomplete  Radiology Studies: DG CHEST PORT 1 VIEW  Result Date: 08/12/2022 CLINICAL DATA:  Shortness of breath EXAM: PORTABLE CHEST 1 VIEW COMPARISON:  August 10, 2022 FINDINGS: Stable right central line. No pneumothorax. The cardiomediastinal silhouette is stable. Lungs are clear. No other abnormalities. IMPRESSION: No active disease. Electronically Signed   By: Gerome Sam III M.D.   On: 08/12/2022 13:01   US Abdomen Limited RUQ (LIVER/GB)  Result Date: 08/12/2022 CLINICAL DATA:  Abdominal pain for a week. Recent abdominal surgery. EXAM: ULTRASOUND ABDOMEN LIMITED RIGHT UPPER QUADRANT COMPARISON:  CT scan of the abdomen and pelvis August 06, 2022 and August 10, 2022 FINDINGS: Gallbladder: The gallbladder wall is borderline in thickness measuring 3 mm. Pericholecystic fluid identified. Sludge identified in the gallbladder. No stones noted. Common bile duct: Diameter: 4 mm Liver: Coarsened echotexture. Known hemangioma measuring 2.1 cm in the left hepatic lobe. No other abnormalities in the liver. Portal vein is patent on color Doppler imaging with normal direction of blood flow towards the liver. Other: A right pleural effusion is noted. Free fluid is identified Morison's pouch. IMPRESSION: 1. Sludge in the gallbladder. The gallbladder wall is borderline. No stones. Pericholecystic fluid, a nonspecific finding in the postoperative setting. 2. Known hemangioma in the left hepatic lobe. Known hepatic steatosis. 3. Right pleural effusion. 4. Free fluid in Morison's pouch could be  postoperative given history. Electronically Signed   By: Gerome Sam III M.D.   On: 08/12/2022 13:01        Scheduled Meds:  Chlorhexidine Gluconate Cloth  6 each Topical Daily   pantoprazole (PROTONIX) IV  40 mg Intravenous Q12H   Continuous Infusions:  sodium chloride Stopped (08/10/22 2129)   dextrose 5 % and 0.9 % NaCl with KCl 20 mEq/L 100 mL/hr at 08/12/22 1854   piperacillin-tazobactam (ZOSYN)  IV Stopped (08/13/22 0819)     LOS: 7 days    Time spent: 60 minutes spent on chart review, discussion with nursing staff, consultants, updating family and interview/physical exam; more than 50% of that time was spent in counseling and/or coordination of care.    Alvira Philips [place], DO [facility] Available via Epic secure chat 7am-7pm After these hours, please refer to coverage provider listed on [email or web address] 08/13/2022, 10:30 AM

## 2022-08-13 NOTE — Progress Notes (Addendum)
       Overnight   NAME: Sarah Moore MRN: [id number] DOB : May 16, 1950    Date of Service   08/13/2022   HPI/Events of Note    Notified by Attending for consideration of NG tube and Lactulose dosing       ( oral vs NG tube).  Bedside visit completed, unable to safely ingest PO. Will order NGT and Lactulose.      Interventions/ Plan   NG tube  Lactulose dosing KUB on insertion       Chinita Greenland BSN MSNA MSN ACNPC-AG Acute Care Nurse Practitioner [facility]

## 2022-08-13 NOTE — Consult Note (Signed)
[facility] TeleNeurology Consult Services    Date of Service:  08/13/2022      Metrics: Last Known Well: 1500 Recognition of symptoms: 1615 Symptoms: As per HPI.  Patient is not a candidate for thrombolytic. Recent major abdominal surgery  Location of the provider: [facility]  Location of the patient: [facility] Pre-Morbid Modified Rankin Scale: 0 Time Code Stroke Page received:  4:29 PM Time neurologist arrived:  4:38 PM Time NIHSS completed: 5:00 PM    This consult was provided via telemedicine with 2-way video and audio communication. The patient/family was informed that care would be provided in this way and agreed to receive care in this manner.   CCM Physician notified of diagnostic impression and management plan at: 5:25 PM   Assessment: 72 year old female who is s/p exploratory laparotomy with sigmoid colectomy and colostomy who this afternoon experienced sudden onset of neurological changes on a post-op baseline of fluctuating mentation. LKN 1500.  - Exam reveals altered mental status and left worse than right sided weakness in conjunction with left hemineglect. NIHSS 25.  - CT head: No acute intracranial hemorrhage or evidence of evolving large vessel territory infarct. ASPECT score is 10. - CTA of head and neck with CTP: No perfusion deficit. No LVO. Discussed with Radiology.  - The patient is not a TNK candidate due to recent major surgery.  - The patient is not a candidate for thrombectomy due to no LVO on CTA.  - DDx for presentation includes acute stroke not yet visible on CT versus acute toxic/metabolic/infectious encephalopathy.        Recommendations: - MRI brain if no contraindications.  - Avoid sedating, anticholinergic and other deliriogenic medications.  - Toxic/metabolic/infectious work up - EEG (ordered) - Frequent neuro checks      ------------------------------------------------------------------------------   History of Present Illness: The patient is a 72 year old female with a PMHx as listed below, in addition to remote DVT not on anticoagulation who was admitted to the hospital for management of constipation and abdominal pain. CT abdomen/pelvis revealed colocolonic intussusception at the junction of the descending and sigmoid colon likely due to a large intraluminal lipoma serving as a lead point, mild mural thickening of the into the septum without frank evidence of bowel ischemia concerning for partial functional colonic obstruction with moderate gaseous distention of the proximal colon.  She is now s/p EXPLORATORY LAPAROTOMY, sigmoid colectomy WITH COLOSTOMY. At baseline since the operation she has fluctuating mentation ranging from confused but able to speak, to lethargic and slow to respond this morning. Complications this admission have included circulatory shock with lactic acidosis, now resolved. Also with elevated LFTs likely secondary to shock liver, acute hypoxic respiratory failure, ARF, elevated troponin and hypokalemia. Prophylactic SQ heparin was stopped yesterday due to black tarry stools.   Today at 1615 she was noted by her RN to have new right facial droop, LLE weakness and was repeating her words. CBG was 165. A Code Stroke was called. LKN 1500.      Past Medical History: Past Medical History:  Diagnosis Date   Acid reflux    Arthritis    Chest pain    Fatigue    GERD (gastroesophageal reflux disease)    Hyperlipidemia   Aortic aneurysm History of DVT    Past Surgical History: Past Surgical History:  Procedure Laterality Date   ABDOMINAL HYSTERECTOMY     COLONOSCOPY     KNEE ARTHROPLASTY Left    LAPAROTOMY  N/A 08/07/2022   Procedure: EXPLORATORY LAPAROTOMY, sigmoid colectomy WITH COLOSTOMY;  Surgeon: Kinsinger, De Blanch, MD;  Location: [facility];  Service: General;  Laterality: N/A;        Medications:  No current facility-administered medications on file prior to encounter.   Current Outpatient Medications on File Prior to Encounter  Medication Sig Dispense Refill   cycloSPORINE (RESTASIS) 0.05 % ophthalmic emulsion Place 1 drop into both eyes as needed (for dry eyes).     Glycerin-Polysorbate 80 (REFRESH DRY EYE THERAPY OP) Apply 1 drop to eye daily as needed.     lactulose (CHRONULAC) 10 GM/15ML solution Take 15 mLs (10 g total) by mouth 2 (two) times daily as needed for moderate constipation or severe constipation. 236 mL 0   Multiple Vitamin (MULTIVITAMIN WITH MINERALS) TABS tablet Take 2 tablets by mouth daily.     PRESCRIPTION MEDICATION Place 1 Application rectally as needed ("after trips to the restroom" hemorrhoids/bleeding). Nitroglycerin 0.125 % ointment     atorvastatin (LIPITOR) 20 MG tablet Take 20 mg by mouth daily. (Patient not taking: Reported on 08/08/2022)     gabapentin (NEURONTIN) 300 MG capsule as needed. (Patient not taking: Reported on 08/08/2022)     hydrOXYzine (ATARAX) 25 MG tablet as needed. (Patient not taking: Reported on 08/08/2022)     omeprazole (PRILOSEC) 40 MG capsule Take 40 mg by mouth daily. (Patient not taking: Reported on 08/08/2022)      Current Facility-Administered Medications:    0.9 %  sodium chloride infusion, , Intravenous, PRN, [place], Alvira Philips, DO, Stopped at 08/10/22 2129   Chlorhexidine Gluconate Cloth 2 % PADS 6 each, 6 each, Topical, Daily, [place], Alvira Philips, DO, 6 each at 08/13/22 0919   dextrose 5 % and 0.9 % NaCl with KCl 20 mEq/L infusion, , Intravenous, Continuous, [place], Alvira Philips, DO, Last Rate: 100 mL/hr at 08/13/22 1513, New Bag at 08/13/22 1513   ondansetron (ZOFRAN) injection 4 mg, 4 mg, Intravenous, Q6H PRN, Barnetta Chapel, PA-C, 4 mg at 08/10/22 1610   Oral care mouth rinse, 15 mL, Mouth Rinse, PRN, [place], Zamirah Denny J, DO   pantoprazole (PROTONIX) injection 40 mg, 40 mg, Intravenous, Q12H, [place], Alvira Philips, DO, 40 mg at  08/13/22 0919   piperacillin-tazobactam (ZOSYN) IVPB 3.375 g, 3.375 g, Intravenous, Q8H, [place], Alvira Philips, DO, Last Rate: 12.5 mL/hr at 08/13/22 1500, Infusion Verify at 08/13/22 1500      Social History: Negative for drug or EtOH use.    Family History:  Reviewed in Epic   ROS: As per HPI    Anticoagulant use:  No   Antiplatelet use: No   Examination:    BP (!) 155/81 (BP Location: Left Arm)   Pulse 93   Temp 98.8 F (37.1 C) (Core)   Resp 12   Ht 5' (1.524 m)   Wt 50.8 kg   SpO2 94%   BMI 21.87 kg/m     1A: Level of Consciousness - 2 1B: Ask Month and Age - 2 (responds with "ok" to all questions) 1C: Blink Eyes & Squeeze Hands - 2 2: Test Horizontal Extraocular Movements - 1 3: Test Visual Fields - 0 4: Test Facial Palsy (Use Grimace if Obtunded) - 0 5A: Test Left Arm Motor Drift - 4 5B: Test Right Arm Motor Drift - 2 6A: Test Left Leg Motor Drift - 4 6B: Test Right Leg Motor Drift - 4 7: Test Limb Ataxia (FNF/Heel-Shin) - 0 8: Test Sensation -  0 9: Test  Language/Aphasia - 2 10: Test Dysarthria - Severe Dysarthria: 1 11: Test Extinction/Inattention - Extinction to bilateral simultaneous stimulation 1   NIHSS Score: 25     Patient/Family was informed the Neurology Consult would occur via TeleHealth consult by way of interactive audio and video telecommunications and consented to receiving care in this manner.   Patient is being evaluated for possible acute neurologic impairment and high pretest probability of imminent or life-threatening deterioration. I spent total of 47 minutes providing care to this patient, including time for face to face visit via telemedicine, review of medical records, imaging studies and discussion of findings with providers, the patient and/or family.   Electronically signed: Dr. Caryl Pina

## 2022-08-13 NOTE — Progress Notes (Signed)
This RN at bedside to perform 1600 assessment. Patient on evaluation present with (R) facial drop, dysarthria, (R) gaze preference, grip absent. Per patient's daughter, this is not baseline as patient was communicating earlier in the shift with her and prior RN. Charge RN called to bedside for dual patient evaluation. [place], DO notified of neuro changes and at bedside. Code stroke activated @ 1628, STAT head CT ordered. Patient accompanied to CT scanner by this RN and Consulting civil engineer, tele neuro cart also taken to CT. NIHSS 25 @ this time. Refer to [facility] and [facility] for further documentation.

## 2022-08-13 NOTE — Progress Notes (Signed)
6 Days Post-Op   Subjective/Chief Complaint: Alert and will just say "I'm fine.".  she is not oriented on my exam.  Ostomy with a hole in the bag.  RN to change.  Off pressors   Objective: Vital signs in last 24 hours: Temp:  [97.9 F (36.6 C)-98.8 F (37.1 C)] 98.2 F (36.8 C) (04/29 0800) Pulse Rate:  [80-99] 86 (04/29 0800) Resp:  [7-19] 13 (04/29 0800) BP: (109-144)/(65-81) 140/72 (04/29 0800) SpO2:  [93 %-95 %] 95 % (04/29 0800) Last BM Date : 08/12/22  Intake/Output from previous day: 04/28 0701 - 04/29 0700 In: 1565.9 [I.V.:1066.1; IV Piggyback:499.8] Out: 625 [Urine:625] Intake/Output this shift: No intake/output data recorded.  Ab stoma pink and functional.  Hole in pouch present. wound without infection- staples intact, approp tender  Lab Results:  Recent Labs    08/12/22 0622 08/12/22 1701 08/13/22 0436  WBC 9.9  --  16.7*  HGB 8.2* 8.9* 8.2*  HCT 24.7* 26.4* 25.0*  PLT 311  --  314   BMET Recent Labs    08/12/22 0622 08/13/22 0436  NA 135 138  K 3.0* 4.2  CL 89* 99  CO2 34* 35*  GLUCOSE 214* 169*  BUN 13 18  CREATININE 1.32* 1.08*  CALCIUM 7.2* 7.6*   PT/INR Recent Labs    08/12/22 0622  LABPROT 35.7*  INR 3.6*   ABG Recent Labs    08/10/22 1529  HCO3 5.3*    Studies/Results: DG CHEST PORT 1 VIEW  Result Date: 08/12/2022 CLINICAL DATA:  Shortness of breath EXAM: PORTABLE CHEST 1 VIEW COMPARISON:  August 10, 2022 FINDINGS: Stable right central line. No pneumothorax. The cardiomediastinal silhouette is stable. Lungs are clear. No other abnormalities. IMPRESSION: No active disease. Electronically Signed   By: Gerome Sam III M.D.   On: 08/12/2022 13:01   US Abdomen Limited RUQ (LIVER/GB)  Result Date: 08/12/2022 CLINICAL DATA:  Abdominal pain for a week. Recent abdominal surgery. EXAM: ULTRASOUND ABDOMEN LIMITED RIGHT UPPER QUADRANT COMPARISON:  CT scan of the abdomen and pelvis August 06, 2022 and August 10, 2022 FINDINGS:  Gallbladder: The gallbladder wall is borderline in thickness measuring 3 mm. Pericholecystic fluid identified. Sludge identified in the gallbladder. No stones noted. Common bile duct: Diameter: 4 mm Liver: Coarsened echotexture. Known hemangioma measuring 2.1 cm in the left hepatic lobe. No other abnormalities in the liver. Portal vein is patent on color Doppler imaging with normal direction of blood flow towards the liver. Other: A right pleural effusion is noted. Free fluid is identified Morison's pouch. IMPRESSION: 1. Sludge in the gallbladder. The gallbladder wall is borderline. No stones. Pericholecystic fluid, a nonspecific finding in the postoperative setting. 2. Known hemangioma in the left hepatic lobe. Known hepatic steatosis. 3. Right pleural effusion. 4. Free fluid in Morison's pouch could be postoperative given history. Electronically Signed   By: Gerome Sam III M.D.   On: 08/12/2022 13:01   ECHOCARDIOGRAM COMPLETE  Result Date: 08/11/2022    ECHOCARDIOGRAM REPORT   Patient Name:   Sarah Moore Date of Exam: 08/11/2022 Medical Rec #:  [id number]     Height:       60.0 in Accession #:    [id number]    Weight:       112.0 lb Date of Birth:  Aug 29, 1950    BSA:          1.459 m Patient Age:    71 years      BP:  116/66 mmHg Patient Gender: F             HR:           87 bpm. Exam Location:  Inpatient Procedure: 2D Echo, Cardiac Doppler and Color Doppler Indications:    Shock  History:        Patient has prior history of Echocardiogram examinations, most                 recent 08/01/2021. Risk Factors:Dyslipidemia.  Sonographer:    Wallie Char Referring Phys: [id number] MURALI RAMASWAMY IMPRESSIONS  1. Left ventricular ejection fraction, by estimation, is 60 to 65%. The left ventricle has normal function. The left ventricle has no regional wall motion abnormalities. Left ventricular diastolic parameters are consistent with Grade I diastolic dysfunction (impaired relaxation).  2. Right  ventricular systolic function is normal. The right ventricular size is normal. There is moderately elevated pulmonary artery systolic pressure. The estimated right ventricular systolic pressure is 50.3 mmHg.  3. The mitral valve is normal in structure. No evidence of mitral valve regurgitation. No evidence of mitral stenosis.  4. The aortic valve is normal in structure. Aortic valve regurgitation is not visualized. No aortic stenosis is present.  5. The inferior vena cava is dilated in size with <50% respiratory variability, suggesting right atrial pressure of 15 mmHg. Comparison(s): Prior images reviewed side by side. The left ventricular function is unchanged. Increased right atrial pressure and pulmonary artery pressure is now seen, but right ventricular function remains normal. FINDINGS  Left Ventricle: Left ventricular ejection fraction, by estimation, is 60 to 65%. The left ventricle has normal function. The left ventricle has no regional wall motion abnormalities. The left ventricular internal cavity size was normal in size. There is  no left ventricular hypertrophy. Left ventricular diastolic parameters are consistent with Grade I diastolic dysfunction (impaired relaxation). Indeterminate filling pressures. Right Ventricle: The right ventricular size is normal. No increase in right ventricular wall thickness. Right ventricular systolic function is normal. There is moderately elevated pulmonary artery systolic pressure. The tricuspid regurgitant velocity is 2.97 m/s, and with an assumed right atrial pressure of 15 mmHg, the estimated right ventricular systolic pressure is 50.3 mmHg. Left Atrium: Left atrial size was normal in size. Right Atrium: Right atrial size was normal in size. Pericardium: There is no evidence of pericardial effusion. Mitral Valve: The mitral valve is normal in structure. No evidence of mitral valve regurgitation. No evidence of mitral valve stenosis. MV peak gradient, 7.7 mmHg. The  mean mitral valve gradient is 3.0 mmHg. Tricuspid Valve: The tricuspid valve is normal in structure. Tricuspid valve regurgitation is mild . No evidence of tricuspid stenosis. Aortic Valve: The aortic valve is normal in structure. Aortic valve regurgitation is not visualized. No aortic stenosis is present. Aortic valve mean gradient measures 4.0 mmHg. Aortic valve peak gradient measures 8.2 mmHg. Aortic valve area, by VTI measures 2.06 cm. Pulmonic Valve: The pulmonic valve was normal in structure. Pulmonic valve regurgitation is mild. No evidence of pulmonic stenosis. Aorta: The aortic root is normal in size and structure. Venous: The inferior vena cava is dilated in size with less than 50% respiratory variability, suggesting right atrial pressure of 15 mmHg. IAS/Shunts: No atrial level shunt detected by color flow Doppler.  LEFT VENTRICLE PLAX 2D LVIDd:         3.30 cm     Diastology LVIDs:         2.30 cm     LV e' medial:  7.19 cm/s LV PW:         1.10 cm     LV E/e' medial:  12.1 LV IVS:        1.10 cm     LV e' lateral:   8.97 cm/s LVOT diam:     1.90 cm     LV E/e' lateral: 9.7 LV SV:         51 LV SV Index:   35 LVOT Area:     2.84 cm  LV Volumes (MOD) LV vol d, MOD A2C: 57.6 ml LV vol d, MOD A4C: 68.6 ml LV vol s, MOD A2C: 26.1 ml LV vol s, MOD A4C: 27.8 ml LV SV MOD A2C:     31.5 ml LV SV MOD A4C:     68.6 ml LV SV MOD BP:      36.3 ml RIGHT VENTRICLE             IVC RV Basal diam:  3.00 cm     IVC diam: 2.50 cm RV S prime:     14.50 cm/s TAPSE (M-mode): 2.3 cm LEFT ATRIUM             Index        RIGHT ATRIUM           Index LA diam:        3.10 cm 2.12 cm/m   RA Area:     10.70 cm LA Vol (A2C):   23.2 ml 15.90 ml/m  RA Volume:   21.00 ml  14.39 ml/m LA Vol (A4C):   20.9 ml 14.33 ml/m LA Biplane Vol: 22.1 ml 15.15 ml/m  AORTIC VALVE AV Area (Vmax):    2.03 cm AV Area (Vmean):   1.97 cm AV Area (VTI):     2.06 cm AV Vmax:           143.50 cm/s AV Vmean:          97.150 cm/s AV VTI:             0.249 m AV Peak Grad:      8.2 mmHg AV Mean Grad:      4.0 mmHg LVOT Vmax:         102.50 cm/s LVOT Vmean:        67.550 cm/s LVOT VTI:          0.180 m LVOT/AV VTI ratio: 0.72  AORTA Ao Root diam: 2.90 cm Ao Asc diam:  3.10 cm MITRAL VALVE                TRICUSPID VALVE MV Area (PHT): 3.28 cm     TR Peak grad:   35.3 mmHg MV Area VTI:   1.89 cm     TR Vmax:        297.00 cm/s MV Peak grad:  7.7 mmHg MV Mean grad:  3.0 mmHg     SHUNTS MV Vmax:       1.39 m/s     Systemic VTI:  0.18 m MV Vmean:      77.6 cm/s    Systemic Diam: 1.90 cm MV Decel Time: 231 msec MV E velocity: 87.20 cm/s MV A velocity: 120.00 cm/s MV E/A ratio:  0.73 Mihai Croitoru MD Electronically signed by Thurmon Fair MD Signature Date/Time: 08/11/2022/11:18:35 AM    Final     Anti-infectives: Anti-infectives (From admission, onward)    Start     Dose/Rate Route Frequency Ordered Stop   08/11/22 1500  vancomycin (VANCOREADY) IVPB 750 mg/150 mL  Status:  Discontinued        750 mg 150 mL/hr over 60 Minutes Intravenous Every 24 hours 08/10/22 1340 08/11/22 0518   08/11/22 0518  vancomycin variable dose per unstable renal function (pharmacist dosing)  Status:  Discontinued         Does not apply See admin instructions 08/11/22 0518 08/11/22 0850   08/10/22 2200  piperacillin-tazobactam (ZOSYN) IVPB 3.375 g        3.375 g 12.5 mL/hr over 240 Minutes Intravenous Every 8 hours 08/10/22 1334     08/10/22 1430  piperacillin-tazobactam (ZOSYN) IVPB 3.375 g        3.375 g 100 mL/hr over 30 Minutes Intravenous  Once 08/10/22 1333 08/10/22 1451   08/10/22 1430  vancomycin (VANCOCIN) IVPB 1000 mg/200 mL premix        1,000 mg 200 mL/hr over 60 Minutes Intravenous  Once 08/10/22 1338 08/10/22 1507   08/07/22 1000  cefoTEtan (CEFOTAN) 2 g in sodium chloride 0.9 % 100 mL IVPB  Status:  Discontinued        2 g 200 mL/hr over 30 Minutes Intravenous On call to O.R. 08/07/22 0946 08/07/22 1005       Assessment/Plan: Colonic  intussusception POD 6 s/p sigmoid colectomy/colostomy-LK - surgical path benign - became ill POD 3, ct as expected postop, not hemorrhage, better with volume, will continue to follow, she is slow to clear lactate but clinically is better- I have no reason to suspect she has ongoing intraabdominal process right now that is the source of this -WBC up to 16.7 today.  Continue to monitor -INR 3.6 -shock liver overall improving some.  Scheduled tylenol stopped for now -ostomy is working well, but given her confused mentation, will have SLP see her.  If she is cleared, she can have some CLD. -d/w primary service -mobilize as able -pulm toilet as she is able to understand -foley in place, defer to medical service regarding duration of this.  FEN - sips of clears, swallow eval, may have CLD if passes. VTE - lovenox ID - zosyn (aspiration PNA)  Aspiration PNA Septic shock/SIRS - unclear etiology, but improving GERD HLD   Sarah Moore 08/13/2022

## 2022-08-13 NOTE — Progress Notes (Signed)
PT Cancellation Note  Patient Details Name: Sarah Moore MRN: [id number] DOB: 02/05/1951   Cancelled Treatment:    Reason Eval/Treat Not Completed: Fatigue/lethargy limiting ability to participate (pt remains lethargic per RN, she is not able to arouse enough to participate in PT. Will check back tomorrow.)   Tamala Ser PT 08/13/2022  Acute Rehabilitation Services  Office [phone]

## 2022-08-14 ENCOUNTER — Inpatient Hospital Stay (HOSPITAL_COMMUNITY): Payer: Medicare HMO

## 2022-08-14 ENCOUNTER — Inpatient Hospital Stay (HOSPITAL_COMMUNITY)
Admit: 2022-08-14 | Discharge: 2022-08-14 | Disposition: A | Discharge status: Home / Self Care | Payer: Medicare HMO | Attending: Neurology | Admitting: Neurology

## 2022-08-14 DIAGNOSIS — R569 Unspecified convulsions: Secondary | ICD-10-CM | POA: Diagnosis not present

## 2022-08-14 DIAGNOSIS — R4182 Altered mental status, unspecified: Secondary | ICD-10-CM | POA: Diagnosis not present

## 2022-08-14 DIAGNOSIS — K561 Intussusception: Secondary | ICD-10-CM | POA: Diagnosis not present

## 2022-08-14 DIAGNOSIS — E43 Unspecified severe protein-calorie malnutrition: Secondary | ICD-10-CM | POA: Insufficient documentation

## 2022-08-14 DIAGNOSIS — G928 Other toxic encephalopathy: Secondary | ICD-10-CM | POA: Diagnosis not present

## 2022-08-14 LAB — RETICULOCYTES
Immature Retic Fract: 37.8 % — ABNORMAL HIGH (ref 2.3–15.9)
RBC.: 2.37 MIL/uL — ABNORMAL LOW (ref 3.87–5.11)
Retic Count, Absolute: 56.9 10*3/uL (ref 19.0–186.0)
Retic Ct Pct: 2.4 % (ref 0.4–3.1)

## 2022-08-14 LAB — COMPREHENSIVE METABOLIC PANEL
ALT: 1214 U/L — ABNORMAL HIGH (ref 0–44)
AST: 250 U/L — ABNORMAL HIGH (ref 15–41)
Albumin: 1.7 g/dL — ABNORMAL LOW (ref 3.5–5.0)
Alkaline Phosphatase: 145 U/L — ABNORMAL HIGH (ref 38–126)
Anion gap: 7 (ref 5–15)
BUN: 25 mg/dL — ABNORMAL HIGH (ref 8–23)
CO2: 29 mmol/L (ref 22–32)
Calcium: 8 mg/dL — ABNORMAL LOW (ref 8.9–10.3)
Chloride: 104 mmol/L (ref 98–111)
Creatinine, Ser: 0.71 mg/dL (ref 0.44–1.00)
GFR, Estimated: >60 mL/min (ref >60)
Glucose, Bld: 190 mg/dL — ABNORMAL HIGH (ref 70–99)
Potassium: 4.2 mmol/L (ref 3.5–5.1)
Sodium: 140 mmol/L (ref 135–145)
Total Bilirubin: 21.7 mg/dL (ref 0.3–1.2)
Total Protein: 4.2 g/dL — ABNORMAL LOW (ref 6.5–8.1)

## 2022-08-14 LAB — FERRITIN: Ferritin: 2168 ng/mL — ABNORMAL HIGH (ref 11–307)

## 2022-08-14 LAB — CBC
HCT: 22.6 % — ABNORMAL LOW (ref 36.0–46.0)
Hemoglobin: 7.2 g/dL — ABNORMAL LOW (ref 12.0–15.0)
MCH: 29 pg (ref 26.0–34.0)
MCHC: 31.9 g/dL (ref 30.0–36.0)
MCV: 91.1 fL (ref 80.0–100.0)
Platelets: 320 10*3/uL (ref 150–400)
RBC: 2.48 MIL/uL — ABNORMAL LOW (ref 3.87–5.11)
RDW: 14.7 % (ref 11.5–15.5)
WBC: 19.8 10*3/uL — ABNORMAL HIGH (ref 4.0–10.5)
nRBC: 0.6 % — ABNORMAL HIGH (ref 0.0–0.2)

## 2022-08-14 LAB — AMMONIA: Ammonia: 69 umol/L — ABNORMAL HIGH (ref 9–35)

## 2022-08-14 LAB — LACTIC ACID, PLASMA: Lactic Acid, Venous: 3 mmol/L (ref 0.5–1.9)

## 2022-08-14 LAB — GLUCOSE, CAPILLARY
Glucose-Capillary: 131 mg/dL — ABNORMAL HIGH (ref 70–99)
Glucose-Capillary: 159 mg/dL — ABNORMAL HIGH (ref 70–99)
Glucose-Capillary: 165 mg/dL — ABNORMAL HIGH (ref 70–99)
Glucose-Capillary: 169 mg/dL — ABNORMAL HIGH (ref 70–99)
Glucose-Capillary: 180 mg/dL — ABNORMAL HIGH (ref 70–99)
Glucose-Capillary: 197 mg/dL — ABNORMAL HIGH (ref 70–99)

## 2022-08-14 LAB — TYPE AND SCREEN
ABO/RH(D): B POS
Antibody Screen: NEGATIVE

## 2022-08-14 LAB — PHOSPHORUS: Phosphorus: 2.1 mg/dL — ABNORMAL LOW (ref 2.5–4.6)

## 2022-08-14 LAB — PROCALCITONIN: Procalcitonin: 3.92 ng/mL

## 2022-08-14 LAB — IRON AND TIBC
Iron: 148 ug/dL (ref 28–170)
Saturation Ratios: 98 % — ABNORMAL HIGH (ref 10.4–31.8)
TIBC: 151 ug/dL — ABNORMAL LOW (ref 250–450)
UIBC: 3 ug/dL

## 2022-08-14 LAB — FOLATE: Folate: 10 ng/mL (ref >5.9)

## 2022-08-14 LAB — CULTURE, BLOOD (ROUTINE X 2): Culture: NO GROWTH

## 2022-08-14 LAB — VITAMIN B12: Vitamin B-12: 3483 pg/mL — ABNORMAL HIGH (ref 180–914)

## 2022-08-14 LAB — MAGNESIUM: Magnesium: 1.9 mg/dL (ref 1.7–2.4)

## 2022-08-14 IMAGING — DX DG CHEST 1V PORT
1 series · 1 of 1 positions shown · non-contrast
Comparison: 12/17/2019.

CLINICAL DATA: Cough.  Syncopal episode today.

EXAM:
PORTABLE CHEST 1 VIEW

[chest ap]
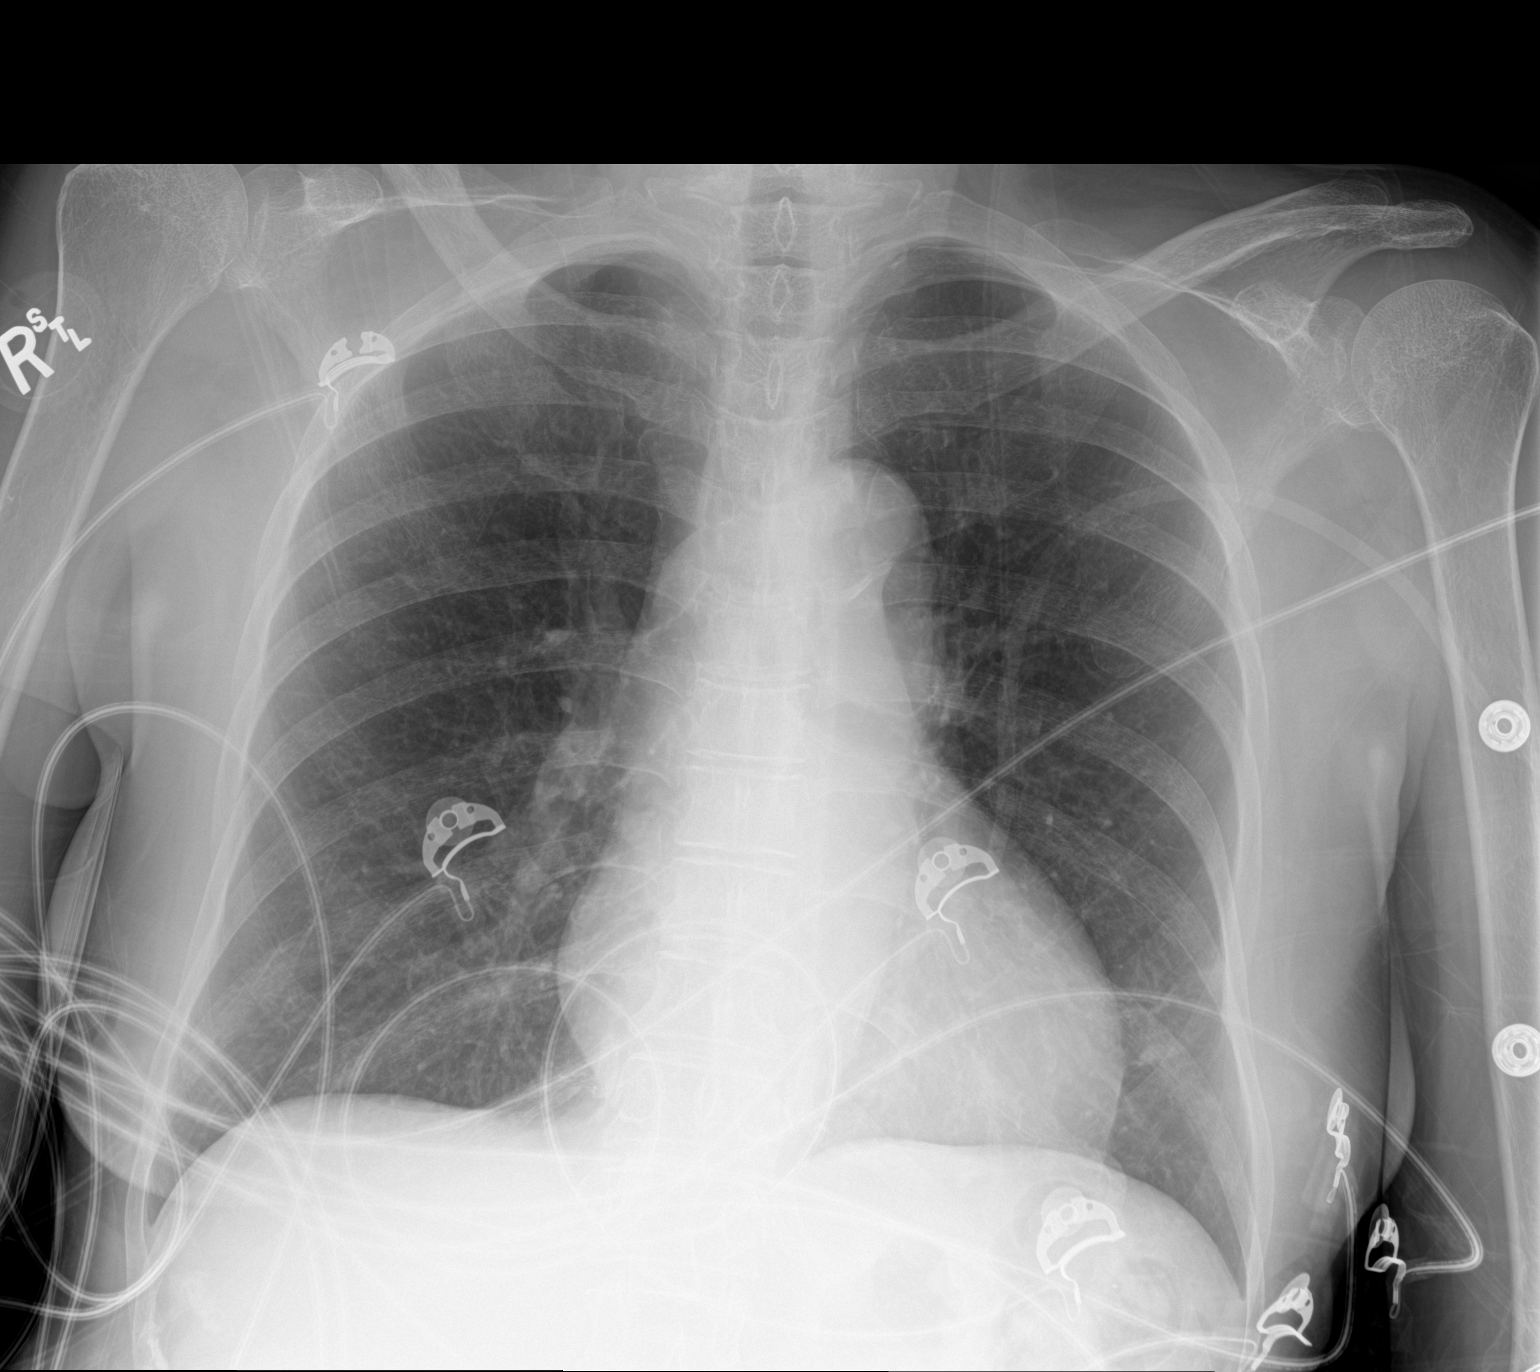

[1 of 1 positions shown; findings below may reference images not displayed]

FINDINGS: Cardiac silhouette is normal in size. Normal mediastinal and hilar
contours.

Clear lungs.  No pleural effusion or pneumothorax.

Skeletal structures are grossly intact.
IMPRESSION: No active disease.

## 2022-08-14 MED ORDER — ZINC CHLORIDE 1 MG/ML IV SOLN
INTRAVENOUS | Status: AC
  Filled 2022-08-14: qty 344.4

## 2022-08-14 MED ORDER — IOHEXOL 9 MG/ML PO SOLN
500.0000 mL | ORAL | Status: AC
  Administered 2022-08-14: 500 mL via ORAL

## 2022-08-14 MED ORDER — SODIUM CHLORIDE 0.9 % IV SOLN: INTRAVENOUS | Status: DC

## 2022-08-14 MED ORDER — THIAMINE HCL 100 MG/ML IJ SOLN
100.0000 mg | Freq: Once | INTRAMUSCULAR | Status: AC
  Administered 2022-08-14: 100 mg via INTRAVENOUS
  Filled 2022-08-14: qty 2

## 2022-08-14 MED ORDER — INSULIN ASPART 100 UNIT/ML IJ SOLN
0.0000 [IU] | Freq: Four times a day (QID) | INTRAMUSCULAR | Status: DC
  Administered 2022-08-14 (×2): 2 [IU] via SUBCUTANEOUS
  Administered 2022-08-15 (×2): 1 [IU] via SUBCUTANEOUS
  Administered 2022-08-15: 2 [IU] via SUBCUTANEOUS
  Administered 2022-08-16 – 2022-08-19 (×10): 1 [IU] via SUBCUTANEOUS
  Administered 2022-08-19: 2 [IU] via SUBCUTANEOUS
  Administered 2022-08-19 – 2022-08-22 (×4): 1 [IU] via SUBCUTANEOUS

## 2022-08-14 MED ORDER — NAPHAZOLINE-GLYCERIN 0.012-0.25 % OP SOLN
1.0000 [drp] | Freq: Four times a day (QID) | OPHTHALMIC | Status: DC | PRN
  Administered 2022-08-17: 1 [drp] via OPHTHALMIC
  Administered 2022-08-17 – 2022-08-20 (×2): 2 [drp] via OPHTHALMIC
  Filled 2022-08-14: qty 15

## 2022-08-14 MED ORDER — LACTULOSE 10 GM/15ML PO SOLN
20.0000 g | Freq: Two times a day (BID) | ORAL | Status: DC
  Administered 2022-08-14: 20 g via ORAL
  Filled 2022-08-14: qty 30

## 2022-08-14 MED ORDER — LACTULOSE 10 GM/15ML PO SOLN
20.0000 g | Freq: Two times a day (BID) | ORAL | Status: DC
  Administered 2022-08-14 – 2022-08-16 (×3): 20 g via ORAL
  Filled 2022-08-14 (×4): qty 30

## 2022-08-14 MED ORDER — IOHEXOL 9 MG/ML PO SOLN
ORAL | Status: AC
  Filled 2022-08-14: qty 1000

## 2022-08-14 MED ORDER — SODIUM PHOSPHATES 45 MMOLE/15ML IV SOLN
15.0000 mmol | Freq: Once | INTRAVENOUS | Status: AC
  Administered 2022-08-14: 15 mmol via INTRAVENOUS
  Filled 2022-08-14: qty 5

## 2022-08-14 MED ORDER — LORAZEPAM 2 MG/ML IJ SOLN
1.0000 mg | Freq: Once | INTRAMUSCULAR | Status: AC
  Administered 2022-08-14: 1 mg via INTRAVENOUS
  Filled 2022-08-14: qty 1

## 2022-08-14 NOTE — Procedures (Addendum)
Patient Name: Sarah Moore  MRN: [id number]  Epilepsy Attending: Charlsie Quest  Referring Physician/Provider: Caryl Pina, MD  Date: 08/14/2022 Duration: 26.08 mins  Patient history: 72yo F with ams getting eeg to evaluate for seizure  Level of alertness:  lethargic   AEDs during EEG study: None  Technical aspects: This EEG study was done with scalp electrodes positioned according to the 10-20 International system of electrode placement. Electrical activity was reviewed with band pass filter of 1-70Hz , sensitivity of 7 uV/mm, display speed of 27mm/sec with a 60Hz  notched filter applied as appropriate. EEG data were recorded continuously and digitally stored.  Video monitoring was available and reviewed as appropriate.  Description: EEG showed continuous generalized 2 to 3 Hz delta slowing.  Generalized periodic discharges with triphasic morphology were also noted at 2 to 2.5 Hz, rarely reaching 3 Hz.  Hyperventilation and photic stimulation were not performed.     ABNORMALITY - Periodic discharges with triphasic morphology, generalized ( GPDs) - Continuous slow, generalized  IMPRESSION: This study showed generalized periodic discharges with triphasic morphology which is on the ictal-interictal continuum.  The morphology and frequency is typically associated with toxic Metabolic causes.  However the frequency reaches 2.5 to 3 Hz which could be interictal.  Consider Ativan challenge and long-term EEG monitoring.  Additionally the study is suggestive of moderate to severe diffuse encephalopathy.    Dr. [place] and Dr Wilford Corner were notified.  Merrick Maggio Annabelle Harman

## 2022-08-14 NOTE — Progress Notes (Signed)
RN @ bedside to perform 2000 assessment. Patient on evaluation with R facial drop and unable to follow commands. NIHSS 30 @ this time. Patient transported to MRI.   Unable to successfully place NG tube x2 by two different RN's. [facility] J.Garner Nash, NP notified. Will attempt in AM when patient is more alert.   RN @ bedside during 0000 assessment patient is able to track with eyes and has purposeful movement in bilateral upper arms. Patient still unable to follow commands.   Refer to flowsheets for further documentation.

## 2022-08-14 NOTE — Progress Notes (Addendum)
Initial Nutrition Assessment  DOCUMENTATION CODES:   Severe malnutrition in context of acute illness/injury  INTERVENTION:  - Per pharmacy, TPN to start at 25mL/hr tonight. Provides 34g protein, 134g dextrose, and 252 IL kcals = 846 kcals  - TPN management per pharmacy.   - Monitor magnesium, potassium, and phosphorus BID for at least 3 days, MD to replete as needed, as pt is at risk for refeeding syndrome given minimal/no intake x1 week and severe malnutrition. - 100mg  thiamine x5 days to aid in macronutrient metabolism.   - Daily weights while on TPN.    NUTRITION DIAGNOSIS:   Severe Malnutrition related to acute illness as evidenced by mild fat depletion, moderate muscle depletion, energy intake < or equal to 50% for > or equal to 5 days.  GOAL:   Patient will meet greater than or equal to 90% of their needs  MONITOR:   Diet advancement, Labs, Weight trends, I & O's, PO intake  REASON FOR ASSESSMENT:   Consult New TPN/TNA  ASSESSMENT:   72 y.o. female with PMH significant for HLD, chronic constipation who presented with complaints of abdominal pain, last BM 1.5 weeks ago, and loss of appetite. Admitted for colocolonic intussusception.   4/22 Admit 4/23 partial colectomy and end colostomy   Patient lethargic at time of visit. Was not responding appropriately to questions. Family member at bedside.   UBW reported to be around 115#. No major changes in weight over the past year but family member reports she may have lost some since admit due to having minimal nutrition the last week.  Admit weight: 115# Current weight: 136# Suspect most recent weight from today affected by fluid, patient +7.6L today.   Family member reports patient is more of a snacker at home. Does not consume nutrition supplements at home.   Intake has been limited the past week due to patient being mostly NPO or on clear liquids. SLP following and patient recommended thin liquids. Intake has been  minimal so plan to start TPN today. CT abdomen/pelvis ordered but results pending.  Progress note today indicates that pending these results, possible transition to enteral feeds tomorrow. Also, new diagnosis of hepatic encephalopathy.  Discussed patient with pharmacy. Plan to start TPN at 51mL/hr and add thiamine due to risk of refeeding. D5 to stop once starting TPN. Plan for patient to transfer to [facility] today.    Medications reviewed and include: Insulin, D5 @ 145mL/hr (provides 408 kcals over 24 hours)  Labs reviewed:  Phosphorus 2.1   NUTRITION - FOCUSED PHYSICAL EXAM:  Flowsheet Row Most Recent Value  Orbital Region Moderate depletion  Upper Arm Region Mild depletion  Thoracic and Lumbar Region Mild depletion  Buccal Region No depletion  [edema in face]  Temple Region Moderate depletion  Clavicle Bone Region Moderate depletion  Clavicle and Acromion Bone Region Moderate depletion  Scapular Bone Region Unable to assess  Dorsal Hand Mild depletion  Patellar Region Mild depletion  [edema]  Anterior Thigh Region Mild depletion  Posterior Calf Region Mild depletion  [edema]  Edema (RD Assessment) Mild  Hair Reviewed  Eyes Reviewed  [yellow]  Mouth Reviewed  Skin Reviewed  [jaundiced]  Nails Reviewed       Diet Order:   Diet Order             Diet clear liquid Room service appropriate? Yes; Fluid consistency: Thin  Diet effective now  EDUCATION NEEDS:  Not appropriate for education at this time  Skin:  Skin Assessment: Skin Integrity Issues: Skin Integrity Issues:: Incisions Incisions: Medial abdomen  Last BM:  4/30 - colostomy  Height:  Ht Readings from Last 1 Encounters:  08/10/22 5' (1.524 m)   Weight:  Wt used for calculations:  08/10/22 50.8 kg    BMI:  Body mass index is 21.87 kg/m.  Estimated Nutritional Needs:  Kcal:  1800-2050 kcals Protein:  70-85 grams Fluid:  >/= 1.8L    Shelle Iron RD, LDN For contact  information, refer to [facility].

## 2022-08-14 NOTE — Consult Note (Signed)
WOC Nurse ostomy follow up Stoma type/location: midline, colostomy Stomal assessment/size: 7/8" oval, retracted Peristomal assessment: dip in the abdominal topography at 9 o'clock, continually leaking here; now with mucocutaneous separation at 9-10 o'clock  Treatment options for stomal/peristomal skin: small piece of silver hydrofiber added in wound/mucocutaneous separation. 1/2  of barrier ring x 2 added in the dip to flatten pouching surface.  Midline wound is less than 1.5cm from the ostomy and the creasing but intact with staples, no evidence of any separation of wound edges despite concern for ostomy output leaking toward wound. Output; none while present, pouch off when WOC arrived.  To receive lactulose  shortly  Ostomy pouching: 1pc. Soft convex with a barrier ring cut in half and apply one on top of the other over [facility] separation and in the dip/creasing  Education provided: NA. While patient is more alert she is not ready for ostomy teaching. EEG tech in the room to apply continuous EEG monitoring  Enrolled patient in [place] Secure Start Discharge program: Yes, previously per Abigail Butts WOC RN  WOC Nurse will follow along with you for continued support with ostomy teaching and care Dechelle Attaway [facility] MSN, RN, [place], CNS, [place] [phone]

## 2022-08-14 NOTE — Progress Notes (Signed)
7 Days Post-Op   Subjective/Chief Complaint: Patient still lethargic but a bit more alert today and is able to tell me who is bedside her as well as where she is.  Can't tell me the year.  Denies abdominal pain unless it is palpated.   Objective: Vital signs in last 24 hours: Temp:  [87.8 F (31 C)-99.1 F (37.3 C)] 98.6 F (37 C) (04/30 0500) Pulse Rate:  [81-119] 83 (04/30 0500) Resp:  [7-17] 13 (04/30 0500) BP: (123-195)/(53-116) 134/61 (04/30 0500) SpO2:  [94 %-100 %] 100 % (04/30 0500) Weight:  [62 kg] 62 kg (04/30 0500) Last BM Date : 08/13/22  Intake/Output from previous day: 04/29 0701 - 04/30 0700 In: 3909.5 [I.V.:3195.4; IV Piggyback:714.1] Out: 1640 [Urine:1600; Stool:40] Intake/Output this shift: No intake/output data recorded.  Abd: stoma pink and functional.  Pouch is leaking again. wound without infection- staples intact, but stool leakage present over wound, some diffuse tenderness  Lab Results:  Recent Labs    08/13/22 1726 08/14/22 0548  WBC 17.9* 19.8*  HGB 7.5* 7.2*  HCT 22.9* 22.6*  PLT 317 320   BMET Recent Labs    08/13/22 1726 08/14/22 0548  NA 140 140  K 4.3 4.2  CL 102 104  CO2 31 29  GLUCOSE 135* 190*  BUN 23 25*  CREATININE 0.85 0.71  CALCIUM 8.0* 8.0*   PT/INR Recent Labs    08/12/22 0622 08/13/22 1726  LABPROT 35.7* 21.8*  INR 3.6* 1.9*   ABG No results for input(s): "PHART", "HCO3" in the last 72 hours.  Invalid input(s): "PCO2", "PO2"   Studies/Results: MR BRAIN WO CONTRAST  Result Date: 08/13/2022 CLINICAL DATA:  Neuro deficit, acute, stroke suspected. EXAM: MRI HEAD WITHOUT CONTRAST TECHNIQUE: Multiplanar, multiecho pulse sequences of the brain and surrounding structures were obtained without intravenous contrast. COMPARISON:  Stroke protocol CT/CTA/CTP 08/13/2022. FINDINGS: Brain: Motion degraded study. Within limits of motion artifact, no acute infarct or hemorrhage. No hydrocephalus or extra-axial collection. No  mass or midline shift. Vascular: Normal flow voids. Skull and upper cervical spine: Normal marrow signal. Sinuses/Orbits: Unremarkable. Other: None. IMPRESSION: Motion degraded study. Within limits of motion artifact, no acute intracranial process. Electronically Signed   By: Orvan Falconer M.D.   On: 08/13/2022 20:53   CT ANGIO HEAD W OR WO CONTRAST  Result Date: 08/13/2022 CLINICAL DATA:  Neuro deficit, acute, stroke suspected. Right facial droop and aphasia. Unable to move lower extremity. EXAM: CT ANGIOGRAPHY HEAD AND NECK CT PERFUSION BRAIN TECHNIQUE: Multidetector CT imaging of the head and neck was performed using the standard protocol during bolus administration of intravenous contrast. Multiplanar CT image reconstructions and MIPs were obtained to evaluate the vascular anatomy. Carotid stenosis measurements (when applicable) are obtained utilizing NASCET criteria, using the distal internal carotid diameter as the denominator. Multiphase CT imaging of the brain was performed following IV bolus contrast injection. Subsequent parametric perfusion maps were calculated using RAPID software. RADIATION DOSE REDUCTION: This exam was performed according to the departmental dose-optimization program which includes automated exposure control, adjustment of the mA and/or kV according to patient size and/or use of iterative reconstruction technique. CONTRAST:  OMNIPAQUE IOHEXOL 350 MG/ML SOLN COMPARISON:  Same-day head CT. FINDINGS: CTA NECK FINDINGS Aortic arch: Two-vessel arch configuration with common origin of the right brachiocephalic and left common carotid arteries. Atherosclerotic calcifications of the aortic arch and arch vessel origins. Arch vessel origins are patent. Right carotid system: No evidence of dissection, stenosis (50% or greater) or occlusion. Mild  calcified plaque along the right carotid bulb and proximal right cervical ICA. Left carotid system: No evidence of dissection, stenosis (50%  or greater) or occlusion. Mild calcified plaque along the left carotid bulb and proximal left cervical ICA. Vertebral arteries: Left dominant.  Patent to the skull base. Skeleton: No suspicious bone lesions. Other neck: Unremarkable. Upper chest: Small bilateral pleural effusions. Review of the MIP images confirms the above findings CTA HEAD FINDINGS Anterior circulation: Calcified plaque along the carotid siphons without hemodynamically significant stenosis. The proximal ACAs and MCAs are patent without stenosis or aneurysm. Distal branches are symmetric. Posterior circulation: The right vertebral artery functionally terminates in PICA. The intracranial portion of the left vertebral artery and basilar artery are patent without stenosis or aneurysm. The SCAs, AICAs and PICAs are patent proximally. The PCAs are patent proximally without stenosis or aneurysm. Distal branches are symmetric. Venous sinuses: As permitted by contrast timing, patent. Anatomic variants: None. Review of the MIP images confirms the above findings CT Brain Perfusion Findings: ASPECTS: 10 CBF (<30%) Volume: 0mL Perfusion (Tmax>6.0s) volume: 0mL Mismatch Volume: 0mL Infarction Location: Not applicable. IMPRESSION: 1. No large vessel occlusion or hemodynamically significant stenosis in the head or neck vessels. 2. No evidence of core infarct or penumbra on CT perfusion. 3. Small bilateral pleural effusions. 4. Aortic Atherosclerosis (ICD10-I70.0). Electronically Signed   By: Orvan Falconer M.D.   On: 08/13/2022 17:30   CT ANGIO NECK W OR WO CONTRAST  Result Date: 08/13/2022 CLINICAL DATA:  Neuro deficit, acute, stroke suspected. Right facial droop and aphasia. Unable to move lower extremity. EXAM: CT ANGIOGRAPHY HEAD AND NECK CT PERFUSION BRAIN TECHNIQUE: Multidetector CT imaging of the head and neck was performed using the standard protocol during bolus administration of intravenous contrast. Multiplanar CT image reconstructions and MIPs  were obtained to evaluate the vascular anatomy. Carotid stenosis measurements (when applicable) are obtained utilizing NASCET criteria, using the distal internal carotid diameter as the denominator. Multiphase CT imaging of the brain was performed following IV bolus contrast injection. Subsequent parametric perfusion maps were calculated using RAPID software. RADIATION DOSE REDUCTION: This exam was performed according to the departmental dose-optimization program which includes automated exposure control, adjustment of the mA and/or kV according to patient size and/or use of iterative reconstruction technique. CONTRAST:  OMNIPAQUE IOHEXOL 350 MG/ML SOLN COMPARISON:  Same-day head CT. FINDINGS: CTA NECK FINDINGS Aortic arch: Two-vessel arch configuration with common origin of the right brachiocephalic and left common carotid arteries. Atherosclerotic calcifications of the aortic arch and arch vessel origins. Arch vessel origins are patent. Right carotid system: No evidence of dissection, stenosis (50% or greater) or occlusion. Mild calcified plaque along the right carotid bulb and proximal right cervical ICA. Left carotid system: No evidence of dissection, stenosis (50% or greater) or occlusion. Mild calcified plaque along the left carotid bulb and proximal left cervical ICA. Vertebral arteries: Left dominant.  Patent to the skull base. Skeleton: No suspicious bone lesions. Other neck: Unremarkable. Upper chest: Small bilateral pleural effusions. Review of the MIP images confirms the above findings CTA HEAD FINDINGS Anterior circulation: Calcified plaque along the carotid siphons without hemodynamically significant stenosis. The proximal ACAs and MCAs are patent without stenosis or aneurysm. Distal branches are symmetric. Posterior circulation: The right vertebral artery functionally terminates in PICA. The intracranial portion of the left vertebral artery and basilar artery are patent without stenosis or  aneurysm. The SCAs, AICAs and PICAs are patent proximally. The PCAs are patent proximally without stenosis or aneurysm.  Distal branches are symmetric. Venous sinuses: As permitted by contrast timing, patent. Anatomic variants: None. Review of the MIP images confirms the above findings CT Brain Perfusion Findings: ASPECTS: 10 CBF (<30%) Volume: 0mL Perfusion (Tmax>6.0s) volume: 0mL Mismatch Volume: 0mL Infarction Location: Not applicable. IMPRESSION: 1. No large vessel occlusion or hemodynamically significant stenosis in the head or neck vessels. 2. No evidence of core infarct or penumbra on CT perfusion. 3. Small bilateral pleural effusions. 4. Aortic Atherosclerosis (ICD10-I70.0). Electronically Signed   By: Orvan Falconer M.D.   On: 08/13/2022 17:30   CT CEREBRAL PERFUSION W CONTRAST  Result Date: 08/13/2022 CLINICAL DATA:  Neuro deficit, acute, stroke suspected. Right facial droop and aphasia. Unable to move lower extremity. EXAM: CT ANGIOGRAPHY HEAD AND NECK CT PERFUSION BRAIN TECHNIQUE: Multidetector CT imaging of the head and neck was performed using the standard protocol during bolus administration of intravenous contrast. Multiplanar CT image reconstructions and MIPs were obtained to evaluate the vascular anatomy. Carotid stenosis measurements (when applicable) are obtained utilizing NASCET criteria, using the distal internal carotid diameter as the denominator. Multiphase CT imaging of the brain was performed following IV bolus contrast injection. Subsequent parametric perfusion maps were calculated using RAPID software. RADIATION DOSE REDUCTION: This exam was performed according to the departmental dose-optimization program which includes automated exposure control, adjustment of the mA and/or kV according to patient size and/or use of iterative reconstruction technique. CONTRAST:  OMNIPAQUE IOHEXOL 350 MG/ML SOLN COMPARISON:  Same-day head CT. FINDINGS: CTA NECK FINDINGS Aortic arch: Two-vessel  arch configuration with common origin of the right brachiocephalic and left common carotid arteries. Atherosclerotic calcifications of the aortic arch and arch vessel origins. Arch vessel origins are patent. Right carotid system: No evidence of dissection, stenosis (50% or greater) or occlusion. Mild calcified plaque along the right carotid bulb and proximal right cervical ICA. Left carotid system: No evidence of dissection, stenosis (50% or greater) or occlusion. Mild calcified plaque along the left carotid bulb and proximal left cervical ICA. Vertebral arteries: Left dominant.  Patent to the skull base. Skeleton: No suspicious bone lesions. Other neck: Unremarkable. Upper chest: Small bilateral pleural effusions. Review of the MIP images confirms the above findings CTA HEAD FINDINGS Anterior circulation: Calcified plaque along the carotid siphons without hemodynamically significant stenosis. The proximal ACAs and MCAs are patent without stenosis or aneurysm. Distal branches are symmetric. Posterior circulation: The right vertebral artery functionally terminates in PICA. The intracranial portion of the left vertebral artery and basilar artery are patent without stenosis or aneurysm. The SCAs, AICAs and PICAs are patent proximally. The PCAs are patent proximally without stenosis or aneurysm. Distal branches are symmetric. Venous sinuses: As permitted by contrast timing, patent. Anatomic variants: None. Review of the MIP images confirms the above findings CT Brain Perfusion Findings: ASPECTS: 10 CBF (<30%) Volume: 0mL Perfusion (Tmax>6.0s) volume: 0mL Mismatch Volume: 0mL Infarction Location: Not applicable. IMPRESSION: 1. No large vessel occlusion or hemodynamically significant stenosis in the head or neck vessels. 2. No evidence of core infarct or penumbra on CT perfusion. 3. Small bilateral pleural effusions. 4. Aortic Atherosclerosis (ICD10-I70.0). Electronically Signed   By: Orvan Falconer M.D.   On: 08/13/2022  17:30   CT HEAD CODE STROKE WO CONTRAST  Result Date: 08/13/2022 CLINICAL DATA:  Code stroke. Neuro deficit, acute, stroke suspected. EXAM: CT HEAD WITHOUT CONTRAST TECHNIQUE: Contiguous axial images were obtained from the base of the skull through the vertex without intravenous contrast. RADIATION DOSE REDUCTION: This exam was performed according  to the departmental dose-optimization program which includes automated exposure control, adjustment of the mA and/or kV according to patient size and/or use of iterative reconstruction technique. COMPARISON:  None Available. FINDINGS: Brain: No acute intracranial hemorrhage. Gray-white differentiation is preserved. No hydrocephalus or extra-axial collection. No mass effect or midline shift. Vascular: No hyperdense vessel or unexpected calcification. Skull: No calvarial fracture or suspicious bone lesion. Skull base is unremarkable. Sinuses/Orbits: Unremarkable. Other: None. ASPECTS ([place] Stroke Program Early CT Score) - Ganglionic level infarction (caudate, lentiform nuclei, internal capsule, insula, M1-M3 cortex): 7 - Supraganglionic infarction (M4-M6 cortex): 3 Total score (0-10 with 10 being normal): 10 IMPRESSION: No acute intracranial hemorrhage or evidence of evolving large vessel territory infarct. ASPECT score is 10. Code stroke imaging results were communicated on 08/13/2022 at 4:52 pm to provider Dr. Otelia Limes via secure text paging. Electronically Signed   By: Orvan Falconer M.D.   On: 08/13/2022 16:54   DG CHEST PORT 1 VIEW  Result Date: 08/12/2022 CLINICAL DATA:  Shortness of breath EXAM: PORTABLE CHEST 1 VIEW COMPARISON:  August 10, 2022 FINDINGS: Stable right central line. No pneumothorax. The cardiomediastinal silhouette is stable. Lungs are clear. No other abnormalities. IMPRESSION: No active disease. Electronically Signed   By: Gerome Sam III M.D.   On: 08/12/2022 13:01   US Abdomen Limited RUQ (LIVER/GB)  Result Date: 08/12/2022 CLINICAL  DATA:  Abdominal pain for a week. Recent abdominal surgery. EXAM: ULTRASOUND ABDOMEN LIMITED RIGHT UPPER QUADRANT COMPARISON:  CT scan of the abdomen and pelvis August 06, 2022 and August 10, 2022 FINDINGS: Gallbladder: The gallbladder wall is borderline in thickness measuring 3 mm. Pericholecystic fluid identified. Sludge identified in the gallbladder. No stones noted. Common bile duct: Diameter: 4 mm Liver: Coarsened echotexture. Known hemangioma measuring 2.1 cm in the left hepatic lobe. No other abnormalities in the liver. Portal vein is patent on color Doppler imaging with normal direction of blood flow towards the liver. Other: A right pleural effusion is noted. Free fluid is identified Morison's pouch. IMPRESSION: 1. Sludge in the gallbladder. The gallbladder wall is borderline. No stones. Pericholecystic fluid, a nonspecific finding in the postoperative setting. 2. Known hemangioma in the left hepatic lobe. Known hepatic steatosis. 3. Right pleural effusion. 4. Free fluid in Morison's pouch could be postoperative given history. Electronically Signed   By: Gerome Sam III M.D.   On: 08/12/2022 13:01    Anti-infectives: Anti-infectives (From admission, onward)    Start     Dose/Rate Route Frequency Ordered Stop   08/11/22 1500  vancomycin (VANCOREADY) IVPB 750 mg/150 mL  Status:  Discontinued        750 mg 150 mL/hr over 60 Minutes Intravenous Every 24 hours 08/10/22 1340 08/11/22 0518   08/11/22 0518  vancomycin variable dose per unstable renal function (pharmacist dosing)  Status:  Discontinued         Does not apply See admin instructions 08/11/22 0518 08/11/22 0850   08/10/22 2200  piperacillin-tazobactam (ZOSYN) IVPB 3.375 g        3.375 g 12.5 mL/hr over 240 Minutes Intravenous Every 8 hours 08/10/22 1334     08/10/22 1430  piperacillin-tazobactam (ZOSYN) IVPB 3.375 g        3.375 g 100 mL/hr over 30 Minutes Intravenous  Once 08/10/22 1333 08/10/22 1451   08/10/22 1430  vancomycin  (VANCOCIN) IVPB 1000 mg/200 mL premix        1,000 mg 200 mL/hr over 60 Minutes Intravenous  Once 08/10/22 1338 08/10/22 1507  08/07/22 1000  cefoTEtan (CEFOTAN) 2 g in sodium chloride 0.9 % 100 mL IVPB  Status:  Discontinued        2 g 200 mL/hr over 30 Minutes Intravenous On call to O.R. 08/07/22 0946 08/07/22 1005       Assessment/Plan: Colonic intussusception POD 7, s/p sigmoid colectomy/colostomy-LK - surgical path benign - became ill POD 3, ct as expected postop, not hemorrhage, better with volume, will continue to follow, she is slow to clear lactate but clinically is better- I have no reason to suspect she has ongoing intraabdominal process right now that is the source of this -WBC up to 19 today and LFTs remain elevated with TB up to 22.  Continue to monitor -INR down to 1.9 -ostomy is working well, CLD given yesterday but due to mentation, unable to take in much.  Need to consider TNA -d/w primary service, will initiate TNA today. -mobilize as able -pulm toilet as she is able to understand -foley in place, defer to medical service regarding duration of this. -given overall persistently ill state, increasing WBC, abdominal tenderness, etc, we will repeat her CT A/P today to eval for etiology.  FEN - CLD, but not taking in much, start TNA today VTE - lovenox ID - [email or web address] (aspiration PNA)  Aspiration PNA Septic shock/SIRS - unclear etiology, CT A/P today. GERD HLD   Sarah Moore 08/14/2022

## 2022-08-14 NOTE — Progress Notes (Signed)
PROGRESS NOTE    Sarah Moore  ZOX:[id number] DOB: 11/15/1950 DOA: 08/06/2022 PCP: Norm Salt, PA    Brief Narrative:   Sarah Moore is a 72 y.o. female with past medical history significant for hyperlipidemia, chronic constipation, history of remote DVT not on anticoagulation who presented to [facility] ED on 4/22 with complaints of abdominal pain.  Patient reports last bowel movement 1.5 weeks ago.  Also endorses loss of appetite.  Patient was seen by urgent care 2 days prior to admission and was given Linzess and Fleet enema without improvement.  Denies nausea or vomiting.  No previous history of abdominal surgery.  Patient follows with GI outpatient, Dr. Elnoria Howard.  Recent flex sigmoidoscopy 4/9 that was reported as normal and colonoscopy February 2024 with only polyps noted.  At that time patient was advised to start taking lactulose.  In the ED, temperature 98.1 F, HR 85, RR 16, BP 123/106, SpO2 98% on room air.  WBC 7.1, hemoglobin 10.9, platelets 303.  Sodium 138, potassium 2.5, chloride 100, CO2 28, glucose 96, BUN 8, creatinine 0.36.  Lipase 24.  AST 14, ALT 10, total bili 1.3.  Urinalysis unrevealing.  CT abdomen/pelvis with colocolonic intussusception at the junction of the descending and sigmoid colon likely due to a large intraluminal lipoma serving as a lead point, mild mural thickening of the into the septum without frank evidence of bowel ischemia concerning for partial functional colonic obstruction with moderate gaseous distention of the proximal colon.  EDP discussed with GI, Dr. Loreta Ave who recommended general surgery consult.  Dr. Luisa Hart was consulted who advised maintain n.p.o. status and recommended hospitalist admission.  TRH consulted for admission for further evaluation management of large bowel obstruction.  Significant Hospital events: 4/22 admit 4/23 partial colectomy and end colostomy 4/24-4/25 nausea w/ clears 4/26 confused, hypotensive moved to ICU.  1.5 liters saline. Started on NE gtt. glucose 17 and treated. 4/27: Titrated off of Levophed and off of Bair hugger.  Started on bicarbonate drip. 4/28: Transferred back to hospitalist service, LFTs improved but T bili rising.  Obtaining RUQ ultrasound 4/29: Lethargic, slow to respond, AST/ALT improving, T. bili rising.  RUQ ultrasound yesterday unrevealing; general surgery to attempt clear liquid diet today 4/30: CVA workup yesterday unremarkable with negative CT head, CT angiogram head/neck and MRI brain.  But notable elevated ammonia level and started on lactulose for hepatic encephalopathy.  EEG today with positive discharges and neurology recommending transfer to [facility] for continuous EEG.  Repeating CT abdomen/pelvis today and starting on TPN; due to poor oral intake on clear liquid diet.  If CT abdomen/pelvis unrevealing, may be able to transition TPN to enternal feeds on 5/1.  Discussed goals of care with family once again today who desire aggressive measures at this time.  Pending transfer to progressive unit at [facility].  Assessment & Plan:   Acute Hepatic encephalopathy Etiology likely secondary to sequelae from a shock liver.  Patient became more lethargic, altered on 4/20 9 in the afternoon and code stroke was initiated for concerns of facial droop and worsening lower extremity weakness.  Patient was seen by neurology underwent CT head, CTA head/neck and MRI brain that were unrevealing.  Ammonia level was found to be elevated at 95.  Attempted NG tube placement x 4 unsuccessful.  Patient is now more alert this morning on 4/30 and was able to tolerate oral lactulose. -- Ammonia 95>69 -- continue lactulose BID -- Monitoring LFTs, ammonia level daily --  Continue to monitor bowel movements  Circulatory shock with lactic acidosis; undifferentiated: Resolved Metabolic acidosis: resolved On 08/10/2022, patient became confused notably hypotensive that was not responsive to IV fluid  resuscitation.  Patient was transferred to the intensive care unit under the critical care service and started on vasopressors.  Lactic acid was greater than 9.  Chest x-ray unrevealing, CT abdomen/pelvis with no significant findings on 4/26.  Consideration of severe volume depletion versus unclear infectious process. -- Vasopressors now titrated off, BP appropriate -- Lactic acid >9--->>3.7>2.3>3.0 -- Procalcitonin 10.96>6.61>3.92 -- Continue Zosyn -- Continue D5NS with 20 mEq of KCl at 130mL/h -- Monitor BP closely, repeat lactic acid procalcitonin in a.m.  Large bowel obstruction 2/2 intraluminal colonic mass s/p Ex lap with partial colectomy and end colostomy The patient presented to ED with persistent abdominal pain.  History of chronic constipation, last reported bowel movement 1.5 weeks prior to admission.  Follows with GI outpatient, Dr. Elnoria Howard.  Patient is afebrile without leukocytosis.  CT abdomen/pelvis on admission with findings of colo-colonic intussusception at the junction of the descending and sigmoid colon likely due to a large intraluminal lipoma serving as a lead point, mild mural thickening of the into the septum without frank evidence of bowel ischemia concerning for partial functional colonic obstruction with moderate gaseous distention of the proximal colon.  General surgery was consulted and patient underwent ex lap with partial colectomy with end colostomy by Dr. Alvan Dame on 08/07/2022.  Findings of large intraluminal mass of the distal left colon; pathology with no dysplasia or malignancy.  Repeat CT abdomen/pelvis 4/26 with expected postoperative changes with no acute findings. -- General Surgery following, appreciate assistance -- Clear liquid diet, starting TPN via right IJ central line today -- Repeat CT abdomen/pelvis with contrast today -- Further per general surgery; if no concerning findings on repeat CT abdomen/pelvis, may consider transition TPN to enteral feeds on  5/1  Elevated LFTs likely secondary to shock liver Patient with notably elevated LFTs, likely secondary to shock liver from circulatory shock/hypotension from severe volume depletion.  Initially requiring vasopressors and significant IV fluid resuscitation.  Vasopressors now titrated off. Right upper quadrant ultrasound with sludge in gallbladder, gallbladder wall borderline, no stones, known hemangioma left hepatic lobe with steatosis, common bile duct 4 mm. -- AST 14>14>7482>5054>1862>581>250 -- ALT 10>9>3651>3363>2353>1839>1214 -- Total bilirubin 1.3>1.3>6.0>6.0>5.7>8.4> 14.7>21.7 (hopefully nearing peak and will start to trend down) -- Avoid hepatotoxins -- LFTs daily  Acute hypoxic respiratory failure, not POA Patient yesterday now needing supplemental oxygen, up to 4 L nasal cannula.  Chest x-ray this morning with no focal consolidation.  Suspect etiology from aggressive IV fluid resuscitation, atelectasis. -- On IV antibiotics as above -- Incentive spirometry -- Continue supplemental oxygen, maintain SpO2 greater than 92%  Acute renal failure: Resolved Etiology likely secondary to ATN from hypotension/circulatory shock as above. -- Cr 0.36>>0.57>1.18>1.26>1.32>1.08>0.71 -- Continue IV fluid hydration -- Avoid nephrotoxins -- Closely monitor urine output; Foley catheter placed -- Follow renal function daily  Elevated troponin Troponin elevated on 4/27 780 followed by 461. TTE 4/27 with LVEF 60 to 65%, no regional wall motion abnormalities.  Etiology likely secondary to type II demand ischemia in the setting of circulatory shock as above.  Denies chest pain.  -- Continue to monitor on telemetry.  Questionable GI bleed Anemia of critical illness Patient with large BM in ostomy on 4/28 with dark tarry appearing stool.  No further blood noted in ostomy since. -- Hgb 10.9>9.8>9.5>8.6>7.6>8.2>8.2>7.5>7.2 -- Discontinued subcutaneous heparin on 4/28 --  Type and screen, anemia  panel -- Protonix 40 mg IV every 12 hours -- Continue monitor H&H every 12 hours -- CBC in a.m.  Hypokalemia Potassium this morning, 3.0, magnesium 2.8.  Will continue repletion with IV and oral potassium. -- Repeat BMP in a.m. to include magnesium  Hyperlipidemia Currently not on statin outpatient.   DVT prophylaxis: SCDs Start: 08/06/22 1950    Code Status: Full Code Family Communication: No family present at bedside this morning  Disposition Plan:  Level of care: Progressive Status is: Inpatient Remains inpatient appropriate because: Transferring to [facility] for neurology and continuous EEG, on clear liquid diet with poor oral intake, starting TPN today.  Pending repeat CT abdomen/pelvis.  Overall guarded prognosis and family wishes for aggressive measures to continue at this time.    Consultants:  General surgery PCCM  Procedures:  Ex lap with partial colectomy and end colostomy, Dr. Sheliah Hatch 4/23 Right IJ central line placed 4/26  Antimicrobials:  Perioperative cefotetan Zosyn 4/26>>   Subjective: Patient seen examined bedside, lying in bed.  Family present at bedside.  Underwent continuous EEG this morning with concern of focal discharges and neurology recommending transfer to [facility] for continuous EEG.  Discussed with general surgery PA this morning, given poor oral intake recommend starting TPN today.  Will also repeat CT abdomen/pelvis to ensure no acute concerns postoperatively going on in her abdomen.  Patient remains confused but lethargy and alertness much improved this morning and was able to tolerate her lactulose this morning.  AST and ALT continue to improve, total bilirubin continues to rise but rate of rise has decreased greatly so hopefully nearing its peak.  Updated family members regarding concerns, transfer that is pending and they continue to wish for aggressive measures at this time.  Patient denies chest pain, no shortness of breath, denies  abdominal pain.  Discussed with SDU RN and charge RN this morning.  No acute concerns overnight per nursing staff.  Objective: Vitals:   08/14/22 0800 08/14/22 0829 08/14/22 0900 08/14/22 1000  BP: 135/69  (!) 152/70 (!) 141/71  Pulse: 91 89 90 92  Resp: 14 13 13 13   Temp: 98.4 F (36.9 C) 98.6 F (37 C) 98.6 F (37 C) 98.8 F (37.1 C)  TempSrc:  Axillary    SpO2: 100% 100% 100% 100%  Weight:      Height:        Intake/Output Summary (Last 24 hours) at 08/14/2022 1037 Last data filed at 08/14/2022 0800 Gross per 24 hour  Intake 3909.49 ml  Output 1675 ml  Net 2234.49 ml   Filed Weights   08/07/22 1014 08/10/22 1107 08/14/22 0500  Weight: 52.2 kg 50.8 kg 62 kg    Examination:  Physical Exam: GEN: NAD, alert and oriented to [facility]), but not person/time/situation.  elderly/ill in appearance; ill in appearance, slow to respond to questions HEENT: NCAT, PERRL, EOMI, scleral icterus, dry mucous membranes PULM: CTAB w/o wheezes/crackles, normal respiratory effort, on 4 L nasal cannula CV: RRR w/o M/G/R GI: abd soft, slight tenderness to palpation surrounding abdominal surgical incision site with staples in place, some stool leaking from colostomy bag,  + BS   MSK: no peripheral edema, moves all extremities NEURO: No focal neurological deficits other than confusion PSYCH: Depressed mood, flat affect Integumentary: Diffuse jaundice, noted colostomy with midline abdominal surgical incision as above, otherwise no other concerning rashes/lesions/wounds noted on exposed skin surfaces    Data Reviewed: I have personally reviewed  following labs and imaging studies  CBC: Recent Labs  Lab 08/11/22 0445 08/12/22 0622 08/12/22 1701 08/13/22 0436 08/13/22 1726 08/14/22 0548  WBC 12.3* 9.9  --  16.7* 17.9* 19.8*  HGB 7.6* 8.2* 8.9* 8.2* 7.5* 7.2*  HCT 23.1* 24.7* 26.4* 25.0* 22.9* 22.6*  MCV 89.5 86.4  --  88.7 89.8 91.1  PLT 293 311  --  314 317 320    Basic Metabolic Panel: Recent Labs  Lab 08/10/22 0546 08/10/22 1740 08/11/22 0445 08/12/22 0622 08/13/22 0436 08/13/22 1726 08/14/22 0548  NA 136   < > 134* 135 138 140 140  K 4.4   < > 3.2* 3.0* 4.2 4.3 4.2  CL 103   < > 98 89* 99 102 104  CO2 18*   < > 21* 34* 35* 31 29  GLUCOSE 70   < > 228* 214* 169* 135* 190*  BUN 8   < > 11 13 18 23  25*  CREATININE 0.57   < > 1.26* 1.32* 1.08* 0.85 0.71  CALCIUM 8.9   < > 7.8* 7.2* 7.6* 8.0* 8.0*  MG 2.0  --  1.7 2.8* 2.4  --  1.9   < > = values in this interval not displayed.   GFR: Estimated Creatinine Clearance: 53 mL/min (by C-G formula based on SCr of 0.71 mg/dL). Liver Function Tests: Recent Labs  Lab 08/11/22 0445 08/12/22 0622 08/13/22 0436 08/13/22 1726 08/14/22 0548  AST 5,054* 1,862* 581* 357* 250*  ALT 3,363* 2,353* 1,839* 1,619* 1,214*  ALKPHOS 140* 144* 139* 135* 145*  BILITOT 5.7* 8.4* 14.7* 17.7* 21.7*  PROT 4.2* 3.8* 4.0* 4.0* 4.2*  ALBUMIN 1.9* 1.6* 1.7* 1.6* 1.7*   Recent Labs  Lab 08/11/22 0915  LIPASE 44   Recent Labs  Lab 08/13/22 1727 08/14/22 0548  AMMONIA 95* 69*   Coagulation Profile: Recent Labs  Lab 08/12/22 0622 08/13/22 1726  INR 3.6* 1.9*   Cardiac Enzymes: Recent Labs  Lab 08/11/22 0915 08/12/22 0622  CKTOTAL 31*  28* 21*  CKMB 5.3* 2.2   BNP (last 3 results) No results for input(s): "PROBNP" in the last 8760 hours. HbA1C: No results for input(s): "HGBA1C" in the last 72 hours. CBG: Recent Labs  Lab 08/13/22 1621 08/13/22 1933 08/13/22 2317 08/14/22 0337 08/14/22 0749  GLUCAP 165* 136* 133* 159* 165*   Lipid Profile: No results for input(s): "CHOL", "HDL", "LDLCALC", "TRIG", "CHOLHDL", "LDLDIRECT" in the last 72 hours. Thyroid Function Tests: No results for input(s): "TSH", "T4TOTAL", "FREET4", "T3FREE", "THYROIDAB" in the last 72 hours. Anemia Panel: Recent Labs    08/14/22 0851 08/14/22 0852  FOLATE 10.0  --   RETICCTPCT  --  2.4   Sepsis  Labs: Recent Labs  Lab 08/11/22 2205 08/12/22 0621 08/12/22 0622 08/13/22 0436 08/13/22 0438 08/14/22 0548  PROCALCITON  --   --  10.96 6.41  --  3.92  LATICACIDVEN 4.4* 3.7*  --   --  2.3* 3.0*    Recent Results (from the past 240 hour(s))  MRSA Next Gen by PCR, Nasal     Status: None   Collection Time: 08/10/22 11:13 AM   Specimen: Nasal Mucosa; Nasal Swab  Result Value Ref Range Status   MRSA by PCR Next Gen NOT DETECTED NOT DETECTED Final    Comment: (NOTE) The GeneXpert MRSA Assay (FDA approved for NASAL specimens only), is one component of a comprehensive MRSA colonization surveillance program. It is not intended to diagnose MRSA infection nor to guide or monitor  treatment for MRSA infections. Test performance is not FDA approved in patients less than 66 years old. Performed at [facility], [address] [place], [place] [address]   Culture, blood (Routine X 2) w Reflex to ID Panel     Status: None (Preliminary result)   Collection Time: 08/10/22  3:30 PM   Specimen: BLOOD LEFT HAND  Result Value Ref Range Status   Specimen Description   Final    BLOOD LEFT HAND Performed at [facility], [address] [place], [place] [address]    Special Requests   Final    BOTTLES DRAWN AEROBIC ONLY Blood Culture adequate volume Performed at [facility], [address] [place], [place] [address]    Culture   Final    NO GROWTH 4 DAYS Performed at [facility], [address], [place], [place] [address]    Report Status PENDING  Incomplete  Culture, blood (Routine X 2) w Reflex to ID Panel     Status: None (Preliminary result)   Collection Time: 08/10/22  6:01 PM   Specimen: BLOOD LEFT HAND  Result Value Ref Range Status   Specimen Description   Final    BLOOD LEFT HAND Performed at [facility], [address], [place], [place] [address]    Special Requests   Final    BOTTLES DRAWN AEROBIC ONLY Blood Culture  results may not be optimal due to an inadequate volume of blood received in culture bottles Performed at [facility], [address] [place], [place] [address]    Culture   Final    NO GROWTH 4 DAYS Performed at [facility], [address], [place], [place] [address]    Report Status PENDING  Incomplete         Radiology Studies: EEG adult  Result Date: 08/21/22 Charlsie Quest, MD     08-21-2022 10:07 AM Patient Name: Sarah Moore MRN: [id number] Epilepsy Attending: Charlsie Quest Referring Physician/Provider: Caryl Pina, MD Date: 08/21/2022 Duration: 26.08 mins Patient history: 72yo F with ams getting eeg to evaluate for seizure Level of alertness:  lethargic AEDs during EEG study: None Technical aspects: This EEG study was done with scalp electrodes positioned according to the 10-20 International system of electrode placement. Electrical activity was reviewed with band pass filter of 1-70Hz , sensitivity of 7 uV/mm, display speed of 81mm/sec with a 60Hz  notched filter applied as appropriate. EEG data were recorded continuously and digitally stored.  Video monitoring was available and reviewed as appropriate. Description: EEG showed continuous generalized 2 to 3 Hz delta slowing.  Generalized.  Discharges with triphasic morphology were also noted at 2 to 2.5 Hz, rarely reaching 3 Hz.  Hyperventilation and photic stimulation were not performed.   ABNORMALITY - Periodic discharges with triphasic morphology, generalized ( GPDs) - Continuous slow, generalized IMPRESSION: This study showed generalized periodic discharges with triphasic morphology which is on the ictal-interictal continuum.  The morphology and frequency is typically associated with toxic Metabolic causes.  However the frequency reaches 2.5 to 3 Hz which could be interictal.  Consider Ativan challenge and long-term EEG monitoring. Additionally the study is suggestive of moderate to severe diffuse  encephalopathy.  Dr. [place] and Dr Wilford Corner were notified. Charlsie Quest   MR BRAIN WO CONTRAST  Result Date: 08/13/2022 CLINICAL DATA:  Neuro deficit, acute, stroke suspected. EXAM: MRI HEAD WITHOUT CONTRAST TECHNIQUE: Multiplanar, multiecho pulse sequences of the  brain and surrounding structures were obtained without intravenous contrast. COMPARISON:  Stroke protocol CT/CTA/CTP 08/13/2022. FINDINGS: Brain: Motion degraded study. Within limits of motion artifact, no acute infarct or hemorrhage. No hydrocephalus or extra-axial collection. No mass or midline shift. Vascular: Normal flow voids. Skull and upper cervical spine: Normal marrow signal. Sinuses/Orbits: Unremarkable. Other: None. IMPRESSION: Motion degraded study. Within limits of motion artifact, no acute intracranial process. Electronically Signed   By: Orvan Falconer M.D.   On: 08/13/2022 20:53   CT ANGIO HEAD W OR WO CONTRAST  Result Date: 08/13/2022 CLINICAL DATA:  Neuro deficit, acute, stroke suspected. Right facial droop and aphasia. Unable to move lower extremity. EXAM: CT ANGIOGRAPHY HEAD AND NECK CT PERFUSION BRAIN TECHNIQUE: Multidetector CT imaging of the head and neck was performed using the standard protocol during bolus administration of intravenous contrast. Multiplanar CT image reconstructions and MIPs were obtained to evaluate the vascular anatomy. Carotid stenosis measurements (when applicable) are obtained utilizing NASCET criteria, using the distal internal carotid diameter as the denominator. Multiphase CT imaging of the brain was performed following IV bolus contrast injection. Subsequent parametric perfusion maps were calculated using RAPID software. RADIATION DOSE REDUCTION: This exam was performed according to the departmental dose-optimization program which includes automated exposure control, adjustment of the mA and/or kV according to patient size and/or use of iterative reconstruction technique. CONTRAST:   OMNIPAQUE IOHEXOL 350 MG/ML SOLN COMPARISON:  Same-day head CT. FINDINGS: CTA NECK FINDINGS Aortic arch: Two-vessel arch configuration with common origin of the right brachiocephalic and left common carotid arteries. Atherosclerotic calcifications of the aortic arch and arch vessel origins. Arch vessel origins are patent. Right carotid system: No evidence of dissection, stenosis (50% or greater) or occlusion. Mild calcified plaque along the right carotid bulb and proximal right cervical ICA. Left carotid system: No evidence of dissection, stenosis (50% or greater) or occlusion. Mild calcified plaque along the left carotid bulb and proximal left cervical ICA. Vertebral arteries: Left dominant.  Patent to the skull base. Skeleton: No suspicious bone lesions. Other neck: Unremarkable. Upper chest: Small bilateral pleural effusions. Review of the MIP images confirms the above findings CTA HEAD FINDINGS Anterior circulation: Calcified plaque along the carotid siphons without hemodynamically significant stenosis. The proximal ACAs and MCAs are patent without stenosis or aneurysm. Distal branches are symmetric. Posterior circulation: The right vertebral artery functionally terminates in PICA. The intracranial portion of the left vertebral artery and basilar artery are patent without stenosis or aneurysm. The SCAs, AICAs and PICAs are patent proximally. The PCAs are patent proximally without stenosis or aneurysm. Distal branches are symmetric. Venous sinuses: As permitted by contrast timing, patent. Anatomic variants: None. Review of the MIP images confirms the above findings CT Brain Perfusion Findings: ASPECTS: 10 CBF (<30%) Volume: 0mL Perfusion (Tmax>6.0s) volume: 0mL Mismatch Volume: 0mL Infarction Location: Not applicable. IMPRESSION: 1. No large vessel occlusion or hemodynamically significant stenosis in the head or neck vessels. 2. No evidence of core infarct or penumbra on CT perfusion. 3. Small bilateral pleural  effusions. 4. Aortic Atherosclerosis (ICD10-I70.0). Electronically Signed   By: Orvan Falconer M.D.   On: 08/13/2022 17:30   CT ANGIO NECK W OR WO CONTRAST  Result Date: 08/13/2022 CLINICAL DATA:  Neuro deficit, acute, stroke suspected. Right facial droop and aphasia. Unable to move lower extremity. EXAM: CT ANGIOGRAPHY HEAD AND NECK CT PERFUSION BRAIN TECHNIQUE: Multidetector CT imaging of the head and neck was performed using the standard protocol during bolus administration of intravenous contrast. Multiplanar CT  image reconstructions and MIPs were obtained to evaluate the vascular anatomy. Carotid stenosis measurements (when applicable) are obtained utilizing NASCET criteria, using the distal internal carotid diameter as the denominator. Multiphase CT imaging of the brain was performed following IV bolus contrast injection. Subsequent parametric perfusion maps were calculated using RAPID software. RADIATION DOSE REDUCTION: This exam was performed according to the departmental dose-optimization program which includes automated exposure control, adjustment of the mA and/or kV according to patient size and/or use of iterative reconstruction technique. CONTRAST:  OMNIPAQUE IOHEXOL 350 MG/ML SOLN COMPARISON:  Same-day head CT. FINDINGS: CTA NECK FINDINGS Aortic arch: Two-vessel arch configuration with common origin of the right brachiocephalic and left common carotid arteries. Atherosclerotic calcifications of the aortic arch and arch vessel origins. Arch vessel origins are patent. Right carotid system: No evidence of dissection, stenosis (50% or greater) or occlusion. Mild calcified plaque along the right carotid bulb and proximal right cervical ICA. Left carotid system: No evidence of dissection, stenosis (50% or greater) or occlusion. Mild calcified plaque along the left carotid bulb and proximal left cervical ICA. Vertebral arteries: Left dominant.  Patent to the skull base. Skeleton: No suspicious  bone lesions. Other neck: Unremarkable. Upper chest: Small bilateral pleural effusions. Review of the MIP images confirms the above findings CTA HEAD FINDINGS Anterior circulation: Calcified plaque along the carotid siphons without hemodynamically significant stenosis. The proximal ACAs and MCAs are patent without stenosis or aneurysm. Distal branches are symmetric. Posterior circulation: The right vertebral artery functionally terminates in PICA. The intracranial portion of the left vertebral artery and basilar artery are patent without stenosis or aneurysm. The SCAs, AICAs and PICAs are patent proximally. The PCAs are patent proximally without stenosis or aneurysm. Distal branches are symmetric. Venous sinuses: As permitted by contrast timing, patent. Anatomic variants: None. Review of the MIP images confirms the above findings CT Brain Perfusion Findings: ASPECTS: 10 CBF (<30%) Volume: 0mL Perfusion (Tmax>6.0s) volume: 0mL Mismatch Volume: 0mL Infarction Location: Not applicable. IMPRESSION: 1. No large vessel occlusion or hemodynamically significant stenosis in the head or neck vessels. 2. No evidence of core infarct or penumbra on CT perfusion. 3. Small bilateral pleural effusions. 4. Aortic Atherosclerosis (ICD10-I70.0). Electronically Signed   By: Orvan Falconer M.D.   On: 08/13/2022 17:30   CT CEREBRAL PERFUSION W CONTRAST  Result Date: 08/13/2022 CLINICAL DATA:  Neuro deficit, acute, stroke suspected. Right facial droop and aphasia. Unable to move lower extremity. EXAM: CT ANGIOGRAPHY HEAD AND NECK CT PERFUSION BRAIN TECHNIQUE: Multidetector CT imaging of the head and neck was performed using the standard protocol during bolus administration of intravenous contrast. Multiplanar CT image reconstructions and MIPs were obtained to evaluate the vascular anatomy. Carotid stenosis measurements (when applicable) are obtained utilizing NASCET criteria, using the distal internal carotid diameter as the  denominator. Multiphase CT imaging of the brain was performed following IV bolus contrast injection. Subsequent parametric perfusion maps were calculated using RAPID software. RADIATION DOSE REDUCTION: This exam was performed according to the departmental dose-optimization program which includes automated exposure control, adjustment of the mA and/or kV according to patient size and/or use of iterative reconstruction technique. CONTRAST:  OMNIPAQUE IOHEXOL 350 MG/ML SOLN COMPARISON:  Same-day head CT. FINDINGS: CTA NECK FINDINGS Aortic arch: Two-vessel arch configuration with common origin of the right brachiocephalic and left common carotid arteries. Atherosclerotic calcifications of the aortic arch and arch vessel origins. Arch vessel origins are patent. Right carotid system: No evidence of dissection, stenosis (50% or greater) or occlusion.  Mild calcified plaque along the right carotid bulb and proximal right cervical ICA. Left carotid system: No evidence of dissection, stenosis (50% or greater) or occlusion. Mild calcified plaque along the left carotid bulb and proximal left cervical ICA. Vertebral arteries: Left dominant.  Patent to the skull base. Skeleton: No suspicious bone lesions. Other neck: Unremarkable. Upper chest: Small bilateral pleural effusions. Review of the MIP images confirms the above findings CTA HEAD FINDINGS Anterior circulation: Calcified plaque along the carotid siphons without hemodynamically significant stenosis. The proximal ACAs and MCAs are patent without stenosis or aneurysm. Distal branches are symmetric. Posterior circulation: The right vertebral artery functionally terminates in PICA. The intracranial portion of the left vertebral artery and basilar artery are patent without stenosis or aneurysm. The SCAs, AICAs and PICAs are patent proximally. The PCAs are patent proximally without stenosis or aneurysm. Distal branches are symmetric. Venous sinuses: As permitted by contrast  timing, patent. Anatomic variants: None. Review of the MIP images confirms the above findings CT Brain Perfusion Findings: ASPECTS: 10 CBF (<30%) Volume: 0mL Perfusion (Tmax>6.0s) volume: 0mL Mismatch Volume: 0mL Infarction Location: Not applicable. IMPRESSION: 1. No large vessel occlusion or hemodynamically significant stenosis in the head or neck vessels. 2. No evidence of core infarct or penumbra on CT perfusion. 3. Small bilateral pleural effusions. 4. Aortic Atherosclerosis (ICD10-I70.0). Electronically Signed   By: Orvan Falconer M.D.   On: 08/13/2022 17:30   CT HEAD CODE STROKE WO CONTRAST  Result Date: 08/13/2022 CLINICAL DATA:  Code stroke. Neuro deficit, acute, stroke suspected. EXAM: CT HEAD WITHOUT CONTRAST TECHNIQUE: Contiguous axial images were obtained from the base of the skull through the vertex without intravenous contrast. RADIATION DOSE REDUCTION: This exam was performed according to the departmental dose-optimization program which includes automated exposure control, adjustment of the mA and/or kV according to patient size and/or use of iterative reconstruction technique. COMPARISON:  None Available. FINDINGS: Brain: No acute intracranial hemorrhage. Gray-white differentiation is preserved. No hydrocephalus or extra-axial collection. No mass effect or midline shift. Vascular: No hyperdense vessel or unexpected calcification. Skull: No calvarial fracture or suspicious bone lesion. Skull base is unremarkable. Sinuses/Orbits: Unremarkable. Other: None. ASPECTS (Alberta Stroke Program Early CT Score) - Ganglionic level infarction (caudate, lentiform nuclei, internal capsule, insula, M1-M3 cortex): 7 - Supraganglionic infarction (M4-M6 cortex): 3 Total score (0-10 with 10 being normal): 10 IMPRESSION: No acute intracranial hemorrhage or evidence of evolving large vessel territory infarct. ASPECT score is 10. Code stroke imaging results were communicated on 08/13/2022 at 4:52 pm to provider Dr.  Otelia Limes via secure text paging. Electronically Signed   By: Orvan Falconer M.D.   On: 08/13/2022 16:54        Scheduled Meds:  Chlorhexidine Gluconate Cloth  6 each Topical Daily   insulin aspart  0-9 Units Subcutaneous Q6H   iohexol  500 mL Oral Q1H   lactulose  20 g Oral BID   pantoprazole (PROTONIX) IV  40 mg Intravenous Q12H   Continuous Infusions:  sodium chloride Stopped (08/10/22 2129)   dextrose 5 % and 0.9 % NaCl with KCl 20 mEq/L 100 mL/hr at 08/14/22 0600   piperacillin-tazobactam (ZOSYN)  IV Stopped (08/14/22 0933)     LOS: 8 days    Time spent: 60 minutes spent on chart review, discussion with nursing staff, consultants, updating family and interview/physical exam; more than 50% of that time was spent in counseling and/or coordination of care.    Alvira Philips [place], DO [facility] Available via Epic secure chat  7am-7pm After these hours, please refer to coverage provider listed on [email or web address] 08/14/2022, 10:37 AM

## 2022-08-14 NOTE — Progress Notes (Signed)
STAT EEG complete - results pending. ? ?

## 2022-08-14 NOTE — Progress Notes (Signed)
Pharmacy Antibiotic Note  Sarah Moore is a 72 y.o. female admitted on 08/06/2022 with sepsis secondary to possible aspiration pneumonia and/or intraabdominal source of infection.  Pharmacy has been consulted for Vancomycin and Piperacillin/Tazobactam dosing.  Plan: Pharmacy has been assisting with dosing of Zosyn 3.375g IV Q8H infused over 4hrs. Dosage remains stable and need for further dosage adjustment appears unlikely at present.   Pharmacy will sign off at this time.  Please reconsult if a change in clinical status warrants re-evaluation of dosage.    Height: 5' (152.4 cm) Weight: 62 kg (136 lb 11 oz) IBW/kg (Calculated) : 45.5  Temp (24hrs), Avg:97.6 F (36.4 C), Min:87.8 F (31 C), Max:99.1 F (37.3 C)  Recent Labs  Lab 08/11/22 0445 08/11/22 0915 08/11/22 1443 08/11/22 2205 08/12/22 0621 08/12/22 0622 08/13/22 0436 08/13/22 0438 08/13/22 1726 08/14/22 0548  WBC 12.3*  --   --   --   --  9.9 16.7*  --  17.9* 19.8*  CREATININE 1.26*  --   --   --   --  1.32* 1.08*  --  0.85 0.71  LATICACIDVEN  --    < > 6.0* 4.4* 3.7*  --   --  2.3*  --  3.0*   < > = values in this interval not displayed.     Estimated Creatinine Clearance: 53 mL/min (by C-G formula based on SCr of 0.71 mg/dL).    Allergies  Allergen Reactions   Codeine Palpitations   Antimicrobials this admission: 4/26 Zosyn >>  4/26 Vancomycin  >> 4/27   Microbiology results: 4/26  MRSA PCR: negative 4/26: blood x2- ngtd  Thank you for allowing pharmacy to be a part of this patient's care.  Lynann Beaver PharmD, BCPS [facility] main pharmacy [phone] 08/14/2022 8:21 AM

## 2022-08-14 NOTE — Progress Notes (Signed)
PT Cancellation Note  Patient Details Name: Sarah Moore MRN: [id number] DOB: [birth year]   Cancelled Treatment:    Reason Eval/Treat Not Completed: Other (comment)  Patient transferred to [facility]. Blanchard Kelch PT Acute Rehabilitation Services Office [phone] Weekend pager-[phone]  Rada Hay 08/14/2022, 3:52 PM

## 2022-08-14 NOTE — Progress Notes (Addendum)
Neurology Progress Note   S:// Seen and examined  O:// Current vital signs: BP 123/71   Pulse 93   Temp 99 F (37.2 C)   Resp 13   Ht 5' (1.524 m)   Wt 62 kg   SpO2 100%   BMI 26.69 kg/m  Vital signs in last 24 hours: Temp:  [87.8 F (31 C)-99.1 F (37.3 C)] 99 F (37.2 C) (04/30 1500) Pulse Rate:  [79-119] 93 (04/30 1602) Resp:  [7-17] 13 (04/30 1500) BP: (123-195)/(53-116) 123/71 (04/30 1602) SpO2:  [95 %-100 %] 100 % (04/30 1602) Weight:  [62 kg] 62 kg (04/30 0500) Gen: WD WN NAD HEENT: [place] AT scleral icterus seen CVS: RRR Resp: CTABL NEUROLOGICAL EXAM Awake, alert, says she is at [facility] where she was before transfer. Told her she is at [facility] hospital and she kept perseverating she is at [facility]. Poor attention concentration Perseveration No dysarthria or aphasia CN 2-12: PERRL, EOMI, VFF, question subtle right lower face droop Motor: antigravity b/l UE with drift equally and arms fall to bed in a few sec. For b/l LE, wiggles toes to commands. Unable to life leg up the bed. Sensation intact Coordination difficutl to assess  Medications  Current Facility-Administered Medications:    0.9 %  sodium chloride infusion, , Intravenous, PRN, [place], Alvira Philips, DO, Stopped at 08/10/22 2129   0.9 %  sodium chloride infusion, , Intravenous, Continuous, Shade, Christine E, RPH   Chlorhexidine Gluconate Cloth 2 % PADS 6 each, 6 each, Topical, Daily, [place], Eric J, DO, 6 each at 08/14/22 0914   dextrose 5 % and 0.9 % NaCl with KCl 20 mEq/L infusion, , Intravenous, Continuous, Shade, Jacqulyn Cane, RPH, Last Rate: 100 mL/hr at 08/14/22 0600, Infusion Verify at 08/14/22 0600   hydrALAZINE (APRESOLINE) injection 5 mg, 5 mg, Intravenous, Q6H PRN, [place], Alvira Philips, DO, 5 mg at 08/13/22 1748   insulin aspart (novoLOG) injection 0-9 Units, 0-9 Units, Subcutaneous, Q6H, Shade, Jacqulyn Cane, RPH, 2 Units at 08/14/22 1236   lactulose (CHRONULAC) 10 GM/15ML solution 20 g, 20 g, Oral, BID, [place], Alvira Philips, DO   ondansetron [facility]) injection 4 mg, 4 mg, Intravenous, Q6H PRN, Barnetta Chapel, PA-C, 4 mg at 08/10/22 4098   Oral care mouth rinse, 15 mL, Mouth Rinse, PRN, [place], Eric J, DO   pantoprazole (PROTONIX) injection 40 mg, 40 mg, Intravenous, Q12H, [place], Alvira Philips, DO, 40 mg at 08/14/22 0915   piperacillin-tazobactam (ZOSYN) IVPB 3.375 g, 3.375 g, Intravenous, Q8H, [place], Alvira Philips, DO, Last Rate: 12.5 mL/hr at 08/14/22 1335, 3.375 g at 08/14/22 1335   sodium phosphate 15 mmol in dextrose 5 % 250 mL infusion, 15 mmol, Intravenous, Once, Shade, Christine E, RPH, Last Rate: 43 mL/hr at 08/14/22 1238, 15 mmol at 08/14/22 1238   TPN ADULT (ION), , Intravenous, Continuous TPN, Winfield Rast, RPH Labs CBC    Component Value Date/Time   WBC 19.8 (H) 08/14/2022 0548   RBC 2.37 (L) 08/14/2022 0852   RBC 2.48 (L) 08/14/2022 0548   HGB 7.2 (L) 08/14/2022 0548   HGB 11.9 10/11/2008 1245   HCT 22.6 (L) 08/14/2022 0548   HCT 34.2 (L) 10/11/2008 1245   PLT 320 08/14/2022 0548   PLT 255 10/11/2008 1245   MCV 91.1 08/14/2022 0548   MCV 86 10/11/2008 1245   MCH 29.0 08/14/2022 0548   MCHC 31.9 08/14/2022 0548   RDW 14.7 08/14/2022 0548   RDW 12.1 10/11/2008 1245   LYMPHSABS 2.6 08/06/2022  1230   LYMPHSABS 2.1 10/11/2008 1245   MONOABS 0.4 08/06/2022 1230   EOSABS 0.1 08/06/2022 1230   EOSABS 0.1 10/11/2008 1245   BASOSABS 0.0 08/06/2022 1230   BASOSABS 0.0 10/11/2008 1245    CMP     Component Value Date/Time   NA 140 08/14/2022 0548   NA 144 10/11/2008 1245   K 4.2 08/14/2022 0548   K 4.4 10/11/2008 1245   CL 104 08/14/2022 0548   CL 105 10/11/2008 1245   CO2 29 08/14/2022 0548   CO2 28 10/11/2008 1245   GLUCOSE 190 (H) 08/14/2022 0548   GLUCOSE 123 (H) 10/11/2008 1245   BUN 25 (H) 08/14/2022 0548   BUN 15 10/11/2008 1245   CREATININE 0.71 08/14/2022 0548   CREATININE 0.6 10/11/2008 1245   CALCIUM 8.0 (L) 08/14/2022 0548   CALCIUM 10.3 10/11/2008 1245   PROT 4.2 (L)  08/14/2022 0548   PROT 7.6 10/11/2008 1245   ALBUMIN 1.7 (L) 08/14/2022 0548   ALBUMIN 3.2 (L) 10/11/2008 1245   AST 250 (H) 08/14/2022 0548   AST 29 10/11/2008 1245   ALT 1,214 (H) 08/14/2022 0548   ALT 39 10/11/2008 1245   ALKPHOS 145 (H) 08/14/2022 0548   ALKPHOS 48 10/11/2008 1245   BILITOT 21.7 (HH) 08/14/2022 0548   BILITOT 1.40 10/11/2008 1245   GFRNONAA >60 08/14/2022 0548   GFRAA >60 12/17/2019 1530    Imaging I have reviewed images in epic and the results pertinent to this consultation are: - CT head: No acute intracranial hemorrhage or evidence of evolving large vessel territory infarct. ASPECT score is 10. - CTA of head and neck with CTP: No perfusion deficit. No LVO. Discussed with Radiology - MRI brain motion degraded, with that constraint, no acute stroke  Assessment:  72 year old female who is s/p exploratory laparotomy with sigmoid colectomy and colostomy who this afternoon experienced sudden onset of neurological changes on a post-op baseline of fluctuating mentation. Code stroke called. Exam showed left hemi-neglect with NIH 25. CT negative, CTA showed no LVO. Patient not TNK candidate due to recent surgery, not thrombectomy candidate due to no LVO.  EEG concerning for GPEDs with concern for them being on ictal interictal continuum with recs for LTM and ativan challenge. Given her liver and kidney dysfunction, this is likely toxic metabolic but will r/o seizures with long term EEG to be sure.  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 Plan d.w Dr [place] over secure chat when transfer was recommended Plan d.w family at bedside. Will follow.  -- Milon Dikes, MD Neurologist [facility] Pager: [phone]

## 2022-08-14 NOTE — Progress Notes (Signed)
LTM EEG hooked up and running - no initial skin breakdown - push button tested - [facility] monitoring.  

## 2022-08-14 NOTE — Progress Notes (Signed)
PHARMACY - TOTAL PARENTERAL NUTRITION CONSULT NOTE   Indication:  poor enteral intake  Patient Measurements: Height: 5' (152.4 cm) Weight: 62 kg (136 lb 11 oz) IBW/kg (Calculated) : 45.5 TPN AdjBW (KG): 50.8 Body mass index is 26.69 kg/m.  Assessment: 1 yoF admitted on 4/22 with Colonic intussusception and is s/p sigmoid colectomy/colostomy on 4/23.  Suspect poor PO intake prior to admission due to long history of severe constipation for over 1 year.  On POD #7 ostomy is working well and she has been ordered CLD, but is unable to get much intake due to mentation.  Pharmacy is consulted to start TPN while waiting for CT results.  If no issues on CT, CCS has no contraindications to enteral tube feedings.  Glucose / Insulin: CBGs 133-165.  No hx diabetes or meds prior to admission.  Electrolytes: Phos low at 2.1, others WNL including CorrCa 9.8 Renal: SCr < 1, BUN up to 25 Hepatic:  AST/ALT elevated but decreasing (peak 7482/3651 > now down to 250/1214), Tbili increasing up to 21.7, alk phos remains elevated at 145.  Trig  Intake / Output; MIVF: Net +2.2 L - UOP 1600 ml/day, Colostomy output 40 ml/day - mIVF: D5-NS-KCl 37mEq/L @ 100 ml/hr GI Imaging: 4/30 CT ordered GI Surgeries / Procedures:  4/23 sigmoid colectomy/colostomy  Central access: CVC triple lumen TPN start date: 4/30   Nutritional Goals: Goal TPN rate is 75 mL/hr to provide 74 g of protein and 1814 kcals per day  RD Assessment:   4/30 RD reports calories: 1700-1850, Protein 70-75 grams Fluid: >/= 1.7L   Current Nutrition:  Clear liquids - no intake recorded  Plan:  Now: NaPhos 15 mmol IV x1 dose Start TPN at 35 mL/hr at 1800  Provides 847 kcal and 134 g dextrose  Low initial rate and slow titration due to high risk for refeeding.  Goal starting rate 100-150g dextrose per day, and 10-20 kcal/kg/day.  Electrolytes in TPN: Na 48mEq/L, K 70mEq/L, Ca 41mEq/L, Mg 15mEq/L, and Phos 28mmol/L. Cl:Ac 1:1 For elevated T.  bili > 6: add standard MVI, remove TE - add back selenium 60 mcg, zinc 5mg , and chromium 10 mcg Add thiamine x5 days (4/30-5/4) Initiate Sensitive q6h SSI and adjust as needed  Reduce MIVF to NS at 40 mL/hr at 1800 Monitor TPN labs daily x3 days (Wed-Friday) then biweekly on Mon/Thurs, and PRN  Lynann Beaver PharmD, BCPS [facility] main pharmacy [phone] 08/14/2022 9:08 AM

## 2022-08-15 ENCOUNTER — Inpatient Hospital Stay (HOSPITAL_COMMUNITY): Payer: Medicare HMO

## 2022-08-15 DIAGNOSIS — K561 Intussusception: Secondary | ICD-10-CM | POA: Diagnosis not present

## 2022-08-15 DIAGNOSIS — K3189 Other diseases of stomach and duodenum: Secondary | ICD-10-CM | POA: Diagnosis not present

## 2022-08-15 DIAGNOSIS — E162 Hypoglycemia, unspecified: Secondary | ICD-10-CM | POA: Diagnosis not present

## 2022-08-15 DIAGNOSIS — E872 Acidosis, unspecified: Secondary | ICD-10-CM | POA: Diagnosis not present

## 2022-08-15 DIAGNOSIS — R4182 Altered mental status, unspecified: Secondary | ICD-10-CM | POA: Diagnosis not present

## 2022-08-15 DIAGNOSIS — R569 Unspecified convulsions: Secondary | ICD-10-CM

## 2022-08-15 DIAGNOSIS — K831 Obstruction of bile duct: Secondary | ICD-10-CM | POA: Diagnosis not present

## 2022-08-15 DIAGNOSIS — D509 Iron deficiency anemia, unspecified: Secondary | ICD-10-CM | POA: Diagnosis not present

## 2022-08-15 DIAGNOSIS — G9341 Metabolic encephalopathy: Secondary | ICD-10-CM | POA: Diagnosis not present

## 2022-08-15 DIAGNOSIS — E43 Unspecified severe protein-calorie malnutrition: Secondary | ICD-10-CM | POA: Diagnosis not present

## 2022-08-15 DIAGNOSIS — E876 Hypokalemia: Secondary | ICD-10-CM | POA: Diagnosis not present

## 2022-08-15 DIAGNOSIS — R578 Other shock: Secondary | ICD-10-CM | POA: Diagnosis not present

## 2022-08-15 LAB — CBC
HCT: 14.4 % — ABNORMAL LOW (ref 36.0–46.0)
Hemoglobin: 4.9 g/dL — CL (ref 12.0–15.0)
MCH: 30.8 pg (ref 26.0–34.0)
MCHC: 34 g/dL (ref 30.0–36.0)
MCV: 90.6 fL (ref 80.0–100.0)
Platelets: 281 10*3/uL (ref 150–400)
RBC: 1.59 MIL/uL — ABNORMAL LOW (ref 3.87–5.11)
RDW: 14.9 % (ref 11.5–15.5)
WBC: 14.7 10*3/uL — ABNORMAL HIGH (ref 4.0–10.5)
nRBC: 0.6 % — ABNORMAL HIGH (ref 0.0–0.2)

## 2022-08-15 LAB — HEPATIC FUNCTION PANEL
ALT: 860 U/L — ABNORMAL HIGH (ref 0–44)
ALT: 909 U/L — ABNORMAL HIGH (ref 0–44)
AST: 172 U/L — ABNORMAL HIGH (ref 15–41)
AST: 182 U/L — ABNORMAL HIGH (ref 15–41)
Albumin: <1.5 g/dL — ABNORMAL LOW (ref 3.5–5.0)
Albumin: <1.5 g/dL — ABNORMAL LOW (ref 3.5–5.0)
Alkaline Phosphatase: 156 U/L — ABNORMAL HIGH (ref 38–126)
Alkaline Phosphatase: 161 U/L — ABNORMAL HIGH (ref 38–126)
Bilirubin, Direct: 16.2 mg/dL — ABNORMAL HIGH (ref 0.0–0.2)
Bilirubin, Direct: 15.2 mg/dL — ABNORMAL HIGH (ref 0.0–0.2)
Indirect Bilirubin: 10.5 mg/dL — ABNORMAL HIGH (ref 0.3–0.9)
Indirect Bilirubin: 12.9 mg/dL — ABNORMAL HIGH (ref 0.3–0.9)
Total Bilirubin: 26.7 mg/dL (ref 0.3–1.2)
Total Bilirubin: 28.1 mg/dL (ref 0.3–1.2)
Total Protein: 4.1 g/dL — ABNORMAL LOW (ref 6.5–8.1)
Total Protein: 4.3 g/dL — ABNORMAL LOW (ref 6.5–8.1)

## 2022-08-15 LAB — DIC (DISSEMINATED INTRAVASCULAR COAGULATION)PANEL
D-Dimer, Quant: >20 ug/mL-FEU — ABNORMAL HIGH (ref 0.00–0.50)
Fibrinogen: 256 mg/dL (ref 210–475)
INR: 1.7 — ABNORMAL HIGH (ref 0.8–1.2)
Platelets: 273 10*3/uL (ref 150–400)
Prothrombin Time: 19.8 seconds — ABNORMAL HIGH (ref 11.4–15.2)
Smear Review: NONE SEEN
aPTT: 44 seconds — ABNORMAL HIGH (ref 24–36)

## 2022-08-15 LAB — CULTURE, BLOOD (ROUTINE X 2)
Culture: NO GROWTH
Special Requests: ADEQUATE

## 2022-08-15 LAB — COMPREHENSIVE METABOLIC PANEL
ALT: 871 U/L — ABNORMAL HIGH (ref 0–44)
AST: 167 U/L — ABNORMAL HIGH (ref 15–41)
Albumin: <1.5 g/dL — ABNORMAL LOW (ref 3.5–5.0)
Alkaline Phosphatase: 148 U/L — ABNORMAL HIGH (ref 38–126)
Anion gap: 3 — ABNORMAL LOW (ref 5–15)
BUN: 21 mg/dL (ref 8–23)
CO2: 28 mmol/L (ref 22–32)
Calcium: 8.2 mg/dL — ABNORMAL LOW (ref 8.9–10.3)
Chloride: 113 mmol/L — ABNORMAL HIGH (ref 98–111)
Creatinine, Ser: 0.79 mg/dL (ref 0.44–1.00)
GFR, Estimated: >60 mL/min (ref >60)
Glucose, Bld: 137 mg/dL — ABNORMAL HIGH (ref 70–99)
Potassium: 3.4 mmol/L — ABNORMAL LOW (ref 3.5–5.1)
Sodium: 144 mmol/L (ref 135–145)
Total Bilirubin: 24.3 mg/dL (ref 0.3–1.2)
Total Protein: 3.8 g/dL — ABNORMAL LOW (ref 6.5–8.1)

## 2022-08-15 LAB — PROTIME-INR
INR: 1.8 — ABNORMAL HIGH (ref 0.8–1.2)
Prothrombin Time: 20.8 seconds — ABNORMAL HIGH (ref 11.4–15.2)

## 2022-08-15 LAB — TRIGLYCERIDES: Triglycerides: 118 mg/dL (ref <150)

## 2022-08-15 LAB — GLUCOSE, CAPILLARY
Glucose-Capillary: 125 mg/dL — ABNORMAL HIGH (ref 70–99)
Glucose-Capillary: 128 mg/dL — ABNORMAL HIGH (ref 70–99)
Glucose-Capillary: 133 mg/dL — ABNORMAL HIGH (ref 70–99)
Glucose-Capillary: 139 mg/dL — ABNORMAL HIGH (ref 70–99)
Glucose-Capillary: 148 mg/dL — ABNORMAL HIGH (ref 70–99)
Glucose-Capillary: 162 mg/dL — ABNORMAL HIGH (ref 70–99)

## 2022-08-15 LAB — HEMOGLOBIN AND HEMATOCRIT, BLOOD
HCT: 19.9 % — ABNORMAL LOW (ref 36.0–46.0)
HCT: 22.8 % — ABNORMAL LOW (ref 36.0–46.0)
HCT: 29.6 % — ABNORMAL LOW (ref 36.0–46.0)
Hemoglobin: 7.1 g/dL — ABNORMAL LOW (ref 12.0–15.0)
Hemoglobin: 8.1 g/dL — ABNORMAL LOW (ref 12.0–15.0)
Hemoglobin: 10.2 g/dL — ABNORMAL LOW (ref 12.0–15.0)

## 2022-08-15 LAB — MAGNESIUM
Magnesium: 1.8 mg/dL (ref 1.7–2.4)
Magnesium: 1.8 mg/dL (ref 1.7–2.4)

## 2022-08-15 LAB — LACTATE DEHYDROGENASE
LDH: 425 U/L — ABNORMAL HIGH (ref 98–192)
LDH: 449 U/L — ABNORMAL HIGH (ref 98–192)

## 2022-08-15 LAB — TYPE AND SCREEN: Unit division: 0

## 2022-08-15 LAB — BPAM RBC
Blood Product Expiration Date: 202405302359
ISSUE DATE / TIME: 202405011219
Unit Type and Rh: 7300

## 2022-08-15 LAB — LACTIC ACID, PLASMA: Lactic Acid, Venous: 1.5 mmol/L (ref 0.5–1.9)

## 2022-08-15 LAB — PHOSPHORUS
Phosphorus: 3.2 mg/dL (ref 2.5–4.6)
Phosphorus: 3.8 mg/dL (ref 2.5–4.6)

## 2022-08-15 LAB — PREPARE RBC (CROSSMATCH)

## 2022-08-15 LAB — AMMONIA: Ammonia: 45 umol/L — ABNORMAL HIGH (ref 9–35)

## 2022-08-15 MED ORDER — IOHEXOL 350 MG/ML SOLN
75.0000 mL | Freq: Once | INTRAVENOUS | Status: AC | PRN
  Administered 2022-08-15: 75 mL via INTRAVENOUS

## 2022-08-15 MED ORDER — FUROSEMIDE 10 MG/ML IJ SOLN
40.0000 mg | Freq: Four times a day (QID) | INTRAMUSCULAR | Status: AC
  Administered 2022-08-15 (×2): 40 mg via INTRAVENOUS
  Filled 2022-08-15 (×2): qty 4

## 2022-08-15 MED ORDER — THIAMINE MONONITRATE 100 MG PO TABS
100.0000 mg | ORAL_TABLET | Freq: Every day | ORAL | Status: AC
  Administered 2022-08-15 – 2022-08-18 (×4): 100 mg
  Filled 2022-08-15 (×4): qty 1

## 2022-08-15 MED ORDER — ORAL CARE MOUTH RINSE
15.0000 mL | OROMUCOSAL | Status: DC
  Administered 2022-08-15 – 2022-08-16 (×3): 15 mL via OROMUCOSAL

## 2022-08-15 MED ORDER — SODIUM CHLORIDE 0.9% IV SOLUTION: Freq: Once | INTRAVENOUS | Status: AC

## 2022-08-15 MED ORDER — POTASSIUM CHLORIDE 10 MEQ/100ML IV SOLN
10.0000 meq | INTRAVENOUS | Status: AC
  Administered 2022-08-15 (×6): 10 meq via INTRAVENOUS
  Filled 2022-08-15 (×7): qty 100

## 2022-08-15 MED ORDER — OSMOLITE 1.5 CAL PO LIQD
1000.0000 mL | ORAL | Status: AC
  Administered 2022-08-15 – 2022-08-24 (×6): 1000 mL
  Filled 2022-08-15 (×8): qty 1000

## 2022-08-15 NOTE — Procedures (Addendum)
Patient Name: Sarah Moore  MRN: [id number]  Epilepsy Attending: Charlsie Quest  Referring Physician/Provider: Milon Dikes, MD  Duration: 08/14/2022 1854 to 08/15/2022 4098   Patient history: 72yo F with ams getting eeg to evaluate for seizure   Level of alertness:  awake, asleep   AEDs during EEG study: None   Technical aspects: This EEG study was done with scalp electrodes positioned according to the 10-20 International system of electrode placement. Electrical activity was reviewed with band pass filter of 1-70Hz , sensitivity of 7 uV/mm, display speed of 72mm/sec with a 60Hz  notched filter applied as appropriate. EEG data were recorded continuously and digitally stored.  Video monitoring was available and reviewed as appropriate.   Description: During awake state, no clear posterior dominant rhythm was seen. Sleep was characterized by sleep spindles (12 -14 Hz), maximal frontocentral region. EEG showed continuous generalized 3-5Hz  theta- delta slowing.  Intermittent generalized periodic discharges with triphasic morphology were also noted at 1-2 Hz, predominantly when awake/stimulated.  Hyperventilation and photic stimulation were not performed.      ABNORMALITY - Periodic discharges with triphasic morphology, generalized ( GPDs) - Continuous slow, generalized   IMPRESSION: This study showed generalized periodic discharges with triphasic morphology most likely toxic-metabolic causes. Additionally there is moderate diffuse encephalopathy.  No seizures were noted.  EEG appears to be improving compared to previous day.    Clancy Mullarkey Annabelle Harman

## 2022-08-15 NOTE — Consult Note (Signed)
Reason for Consult:Severe anemia. Referring Physician: THP.  Sarah Moore is an 72 y.o. female.  HPI: Sarah Moore is a 72 year old black female with multiple medical problems including history of hyperlipidemia remote history of DVTs and chronic constipation presented to the emergency room on 08/06/2022 with abdominal pain.  She was noted to have a colo chronic intussusception and underwent a partial colectomy with end colostomy on 423. Her postoperative course was complicated by hypotension requiring pressors.  She was slow to respond and therefore on 08/14/2022 she had a CVA workup that was unremarkable CT angiogram of the head and neck and MRI of the brain did not show any acute abnormalities.  Today she was noted to have a drop in her hemoglobin from 7 to 4.9 g/dL and therefore GI consultation was procured but no source of obvious blood loss has been identified. Patient is currently in secretory shock with lactic acidosis and metabolic acidosis transferred to the ICU earlier today. Total bili is 28.1 she has a daughter and friend at the bedside and is confused and unable to give any details on her history.  Palliative care consult has been requested.  Past Medical History:  Diagnosis Date   Acid reflux    Arthritis    Chest pain    Fatigue    GERD (gastroesophageal reflux disease)    Hyperlipidemia    Past Surgical History:  Procedure Laterality Date   ABDOMINAL HYSTERECTOMY     COLONOSCOPY     KNEE ARTHROPLASTY Left    LAPAROTOMY N/A 08/07/2022   Procedure: EXPLORATORY LAPAROTOMY, sigmoid colectomy WITH COLOSTOMY;  Surgeon: Kinsinger, De Blanch, MD;  Location: [facility];  Service: General;  Laterality: N/A;   Family History  Adopted: Yes  Problem Relation Age of Onset   Asthma Brother    Stomach cancer Brother    Clotting disorder Son        blood clot   Social History:  reports that she has quit smoking. Her smoking use included cigarettes. She smoked an average of .25 packs per day.  She has never used smokeless tobacco. She reports that she does not drink alcohol and does not use drugs.  Allergies:  Allergies  Allergen Reactions   Codeine Palpitations   Medications: I have reviewed the patient's current medications. Prior to Admission:  Medications Prior to Admission  Medication Sig Dispense Refill Last Dose   cycloSPORINE (RESTASIS) 0.05 % ophthalmic emulsion Place 1 drop into both eyes as needed (for dry eyes).   08/07/2022   Glycerin-Polysorbate 80 (REFRESH DRY EYE THERAPY OP) Apply 1 drop to eye daily as needed.   08/07/2022   lactulose (CHRONULAC) 10 GM/15ML solution Take 15 mLs (10 g total) by mouth 2 (two) times daily as needed for moderate constipation or severe constipation. 236 mL 0 08/07/2022   Multiple Vitamin (MULTIVITAMIN WITH MINERALS) TABS tablet Take 2 tablets by mouth daily.   Past Week   PRESCRIPTION MEDICATION Place 1 Application rectally as needed ("after trips to the restroom" hemorrhoids/bleeding). Nitroglycerin 0.125 % ointment   08/07/2022   atorvastatin (LIPITOR) 20 MG tablet Take 20 mg by mouth daily. (Patient not taking: Reported on 08/08/2022)   Not Taking   gabapentin (NEURONTIN) 300 MG capsule as needed. (Patient not taking: Reported on 08/08/2022)   Not Taking   hydrOXYzine (ATARAX) 25 MG tablet as needed. (Patient not taking: Reported on 08/08/2022)   Not Taking   omeprazole (PRILOSEC) 40 MG capsule Take 40 mg by mouth daily. (Patient not  taking: Reported on 08/08/2022)   Not Taking   Scheduled:  Chlorhexidine Gluconate Cloth  6 each Topical Daily   furosemide  40 mg Intravenous Q6H   insulin aspart  0-9 Units Subcutaneous Q6H   lactulose  20 g Oral BID   mouth rinse  15 mL Mouth Rinse 4 times per day   pantoprazole (PROTONIX) IV  40 mg Intravenous Q12H   thiamine  100 mg Per Tube QHS   Continuous:  sodium chloride Stopped (08/10/22 2129)   sodium chloride Stopped (08/15/22 1436)   feeding supplement (OSMOLITE 1.5 CAL) 20 mL/hr at  08/15/22 1600   piperacillin-tazobactam (ZOSYN)  IV 12.5 mL/hr at 08/15/22 1600   potassium chloride 10 mEq (08/15/22 1802)   ZOX:WRUEAV chloride, hydrALAZINE, naphazoline-glycerin, ondansetron (ZOFRAN) IV, mouth rinse  Results for orders placed or performed during the hospital encounter of 08/06/22 (from the past 48 hour(s))  Comprehensive metabolic panel     Status: Abnormal   Collection Time: 08/13/22  5:26 PM  Result Value Ref Range   Sodium 140 135 - 145 mmol/L   Potassium 4.3 3.5 - 5.1 mmol/L   Chloride 102 98 - 111 mmol/L   CO2 31 22 - 32 mmol/L   Glucose, Bld 135 (H) 70 - 99 mg/dL    Comment: Glucose reference range applies only to samples taken after fasting for at least 8 hours.   BUN 23 8 - 23 mg/dL   Creatinine, Ser 4.09 0.44 - 1.00 mg/dL   Calcium 8.0 (L) 8.9 - 10.3 mg/dL   Total Protein 4.0 (L) 6.5 - 8.1 g/dL   Albumin 1.6 (L) 3.5 - 5.0 g/dL   AST 811 (H) 15 - 41 U/L   ALT 1,619 (H) 0 - 44 U/L   Alkaline Phosphatase 135 (H) 38 - 126 U/L   Total Bilirubin 17.7 (H) 0.3 - 1.2 mg/dL   GFR, Estimated >91 >47 mL/min    Comment: (NOTE) Calculated using the CKD-EPI Creatinine Equation (2021)    Anion gap 7 5 - 15    Comment: Performed at [facility], [address] [place], [place] [address]  CBC     Status: Abnormal   Collection Time: 08/13/22  5:26 PM  Result Value Ref Range   WBC 17.9 (H) 4.0 - 10.5 K/uL   RBC 2.55 (L) 3.87 - 5.11 MIL/uL   Hemoglobin 7.5 (L) 12.0 - 15.0 g/dL   HCT 21.3 (L) 08.6 - 57.8 %   MCV 89.8 80.0 - 100.0 fL   MCH 29.4 26.0 - 34.0 pg   MCHC 32.8 30.0 - 36.0 g/dL   RDW 46.9 62.9 - 52.8 %   Platelets 317 150 - 400 K/uL   nRBC 0.8 (H) 0.0 - 0.2 %    Comment: Performed at [facility], [address], [place], [place] [address]  Protime-INR     Status: Abnormal   Collection Time: 08/13/22  5:26 PM  Result Value Ref Range   Prothrombin Time 21.8 (H) 11.4 - 15.2 seconds   INR 1.9 (H) 0.8 - 1.2     Comment: (NOTE) INR goal varies based on device and disease states. Performed at [facility], [address] [place], [place] [address]   Ammonia     Status: Abnormal   Collection Time: 08/13/22  5:27 PM  Result Value Ref Range   Ammonia 95 (H) 9 - 35 umol/L    Comment: Performed at [facility], [address] [place], [place] [address]  Glucose, capillary     Status: Abnormal   Collection Time: 08/13/22  7:33 PM  Result Value Ref Range   Glucose-Capillary 136 (H) 70 - 99 mg/dL    Comment: Glucose reference range applies only to samples taken after fasting for at least 8 hours.   Comment 1 Notify RN    Comment 2 Document in Chart   Glucose, capillary     Status: Abnormal   Collection Time: 08/13/22 11:17 PM  Result Value Ref Range   Glucose-Capillary 133 (H) 70 - 99 mg/dL    Comment: Glucose reference range applies only to samples taken after fasting for at least 8 hours.   Comment 1 Notify RN    Comment 2 Document in Chart   Glucose, capillary     Status: Abnormal   Collection Time: 08/14/22  3:37 AM  Result Value Ref Range   Glucose-Capillary 159 (H) 70 - 99 mg/dL    Comment: Glucose reference range applies only to samples taken after fasting for at least 8 hours.   Comment 1 Notify RN    Comment 2 Document in Chart   CBC     Status: Abnormal   Collection Time: 08/14/22  5:48 AM  Result Value Ref Range   WBC 19.8 (H) 4.0 - 10.5 K/uL   RBC 2.48 (L) 3.87 - 5.11 MIL/uL   Hemoglobin 7.2 (L) 12.0 - 15.0 g/dL   HCT 60.4 (L) 54.0 - 98.1 %   MCV 91.1 80.0 - 100.0 fL   MCH 29.0 26.0 - 34.0 pg   MCHC 31.9 30.0 - 36.0 g/dL   RDW 19.1 47.8 - 29.5 %   Platelets 320 150 - 400 K/uL   nRBC 0.6 (H) 0.0 - 0.2 %    Comment: Performed at [facility], [address], [place], [place] [address]  Comprehensive metabolic panel     Status: Abnormal   Collection Time: 08/14/22  5:48 AM  Result Value Ref Range   Sodium 140 135 - 145  mmol/L   Potassium 4.2 3.5 - 5.1 mmol/L   Chloride 104 98 - 111 mmol/L   CO2 29 22 - 32 mmol/L   Glucose, Bld 190 (H) 70 - 99 mg/dL    Comment: Glucose reference range applies only to samples taken after fasting for at least 8 hours.   BUN 25 (H) 8 - 23 mg/dL   Creatinine, Ser 8.65 0.44 - 1.00 mg/dL    Comment: ICTERUS AT THIS LEVEL MAY AFFECT RESULT   Calcium 8.0 (L) 8.9 - 10.3 mg/dL   Total Protein 4.2 (L) 6.5 - 8.1 g/dL   Albumin 1.7 (L) 3.5 - 5.0 g/dL   AST 784 (H) 15 - 41 U/L   ALT 1,214 (H) 0 - 44 U/L   Alkaline Phosphatase 145 (H) 38 - 126 U/L   Total Bilirubin 21.7 (HH) 0.3 - 1.2 mg/dL    Comment: CRITICAL RESULT CALLED TO, READ BACK BY AND VERIFIED WITH WATSON,M RN @0623  08/14/22 BY CHILDRESS,E   GFR, Estimated >60 >60 mL/min    Comment: (NOTE) Calculated using the CKD-EPI Creatinine Equation (2021)    Anion gap 7 5 - 15    Comment: Performed at [facility], [address], [place], [place] [address]  Lactic acid, plasma     Status: Abnormal   Collection Time: 08/14/22  5:48 AM  Result Value Ref Range   Lactic Acid, Venous 3.0 (HH) 0.5 - 1.9 mmol/L  Comment: CRITICAL RESULT CALLED TO, READ BACK BY AND VERIFIED WITH WATSON,A RN @0620  08/14/22 BY CHILDRESS.E Performed at [facility], [address] [place], [place] [address]   Procalcitonin     Status: None   Collection Time: 08/14/22  5:48 AM  Result Value Ref Range   Procalcitonin 3.92 ng/mL    Comment:        Interpretation: PCT > 2 ng/mL: Systemic infection (sepsis) is likely, unless other causes are known. (NOTE)       Sepsis PCT Algorithm           Lower Respiratory Tract                                      Infection PCT Algorithm    ----------------------------     ----------------------------         PCT < 0.25 ng/mL                PCT < 0.10 ng/mL          Strongly encourage             Strongly discourage   discontinuation of antibiotics    initiation of  antibiotics    ----------------------------     -----------------------------       PCT 0.25 - 0.50 ng/mL            PCT 0.10 - 0.25 ng/mL               OR       >80% decrease in PCT            Discourage initiation of                                            antibiotics      Encourage discontinuation           of antibiotics    ----------------------------     -----------------------------         PCT >= 0.50 ng/mL              PCT 0.26 - 0.50 ng/mL               AND       <80% decrease in PCT              Encourage initiation of                                             antibiotics       Encourage continuation           of antibiotics    ----------------------------     -----------------------------        PCT >= 0.50 ng/mL                  PCT > 0.50 ng/mL               AND         increase in PCT                  Strongly encourage  initiation of antibiotics    Strongly encourage escalation           of antibiotics                                     -----------------------------                                           PCT <= 0.25 ng/mL                                                 OR                                        > 80% decrease in PCT                                      Discontinue / Do not initiate                                             antibiotics  Performed at [facility], [address] [place], [place] [address]   Magnesium     Status: None   Collection Time: 08/14/22  5:48 AM  Result Value Ref Range   Magnesium 1.9 1.7 - 2.4 mg/dL    Comment: Performed at [facility], [address], [place], [place] [address]  Ammonia     Status: Abnormal   Collection Time: 08/14/22  5:48 AM  Result Value Ref Range   Ammonia 69 (H) 9 - 35 umol/L    Comment: Performed at [facility], [address], [place], [place] [address]  Phosphorus     Status: Abnormal    Collection Time: 08/14/22  5:48 AM  Result Value Ref Range   Phosphorus 2.1 (L) 2.5 - 4.6 mg/dL    Comment: ICTERUS AT THIS LEVEL MAY AFFECT RESULT Performed at [facility], [address] [place], [place] [address]   Glucose, capillary     Status: Abnormal   Collection Time: 08/14/22  7:49 AM  Result Value Ref Range   Glucose-Capillary 165 (H) 70 - 99 mg/dL    Comment: Glucose reference range applies only to samples taken after fasting for at least 8 hours.  Vitamin B12     Status: Abnormal   Collection Time: 08/14/22  8:51 AM  Result Value Ref Range   Vitamin B-12 3,483 (H) 180 - 914 pg/mL    Comment: ICTERUS AT THIS LEVEL MAY AFFECT RESULT RESULT CONFIRMED BY MANUAL DILUTION (NOTE) This assay is not validated for testing neonatal or myeloproliferative syndrome specimens for Vitamin B12 levels. Performed at [facility], [address], [place], [place] [address]   Folate     Status: None   Collection Time: 08/14/22  8:51 AM  Result Value Ref Range  Folate 10.0 >5.9 ng/mL    Comment: ICTERUS AT THIS LEVEL MAY AFFECT RESULT Performed at [facility], [address], [place], [place] [address]   Iron and TIBC     Status: Abnormal   Collection Time: 08/14/22  8:51 AM  Result Value Ref Range   Iron 148 28 - 170 ug/dL   TIBC 604 (L) 540 - 981 ug/dL   Saturation Ratios 98 (H) 10.4 - 31.8 %   UIBC 3 ug/dL    Comment: Performed at [facility], [address], [place], [place] [address]  Ferritin     Status: Abnormal   Collection Time: 08/14/22  8:51 AM  Result Value Ref Range   Ferritin 2,168 (H) 11 - 307 ng/mL    Comment: Performed at [facility], [address] [place], [place] [address]  Reticulocytes     Status: Abnormal   Collection Time: 08/14/22  8:52 AM  Result Value Ref Range   Retic Ct Pct 2.4 0.4 - 3.1 %   RBC. 2.37 (L) 3.87 - 5.11 MIL/uL   Retic Count, Absolute  56.9 19.0 - 186.0 K/uL   Immature Retic Fract 37.8 (H) 2.3 - 15.9 %    Comment: Performed at [facility], [address], [place], [place] [address]  Type and screen [facility]     Status: None   Collection Time: 08/14/22 10:00 AM  Result Value Ref Range   ABO/RH(D) B POS    Antibody Screen NEG    Sample Expiration      08/17/2022,2359 Performed at [facility], [address], [place], [place] [address]   Glucose, capillary     Status: Abnormal   Collection Time: 08/14/22 11:53 AM  Result Value Ref Range   Glucose-Capillary 169 (H) 70 - 99 mg/dL    Comment: Glucose reference range applies only to samples taken after fasting for at least 8 hours.  Glucose, capillary     Status: Abnormal   Collection Time: 08/14/22  6:15 PM  Result Value Ref Range   Glucose-Capillary 180 (H) 70 - 99 mg/dL    Comment: Glucose reference range applies only to samples taken after fasting for at least 8 hours.  Glucose, capillary     Status: Abnormal   Collection Time: 08/14/22  8:16 PM  Result Value Ref Range   Glucose-Capillary 197 (H) 70 - 99 mg/dL    Comment: Glucose reference range applies only to samples taken after fasting for at least 8 hours.  Glucose, capillary     Status: Abnormal   Collection Time: 08/14/22 11:38 PM  Result Value Ref Range   Glucose-Capillary 131 (H) 70 - 99 mg/dL    Comment: Glucose reference range applies only to samples taken after fasting for at least 8 hours.  Glucose, capillary     Status: Abnormal   Collection Time: 08/15/22  5:21 AM  Result Value Ref Range   Glucose-Capillary 148 (H) 70 - 99 mg/dL    Comment: Glucose reference range applies only to samples taken after fasting for at least 8 hours.  Lactic acid, plasma     Status: None   Collection Time: 08/15/22  7:09 AM  Result Value Ref Range   Lactic Acid, Venous 1.5 0.5 - 1.9 mmol/L    Comment: ICTERUS AT THIS LEVEL MAY AFFECT RESULT Performed at  [facility], [address] [place], [place] [address]   Comprehensive metabolic panel  Status: Abnormal   Collection Time: 08/15/22  7:09 AM  Result Value Ref Range   Sodium 144 135 - 145 mmol/L   Potassium 3.4 (L) 3.5 - 5.1 mmol/L   Chloride 113 (H) 98 - 111 mmol/L   CO2 28 22 - 32 mmol/L   Glucose, Bld 137 (H) 70 - 99 mg/dL    Comment: Glucose reference range applies only to samples taken after fasting for at least 8 hours.   BUN 21 8 - 23 mg/dL   Creatinine, Ser 1.61 0.44 - 1.00 mg/dL    Comment: ICTERUS AT THIS LEVEL MAY AFFECT RESULT   Calcium 8.2 (L) 8.9 - 10.3 mg/dL   Total Protein 3.8 (L) 6.5 - 8.1 g/dL   Albumin <0.9 (L) 3.5 - 5.0 g/dL   AST 604 (H) 15 - 41 U/L   ALT 871 (H) 0 - 44 U/L   Alkaline Phosphatase 148 (H) 38 - 126 U/L   Total Bilirubin 24.3 (HH) 0.3 - 1.2 mg/dL    Comment: CRITICAL RESULT CALLED TO, READ BACK BY AND VERIFIED WITH CHRISTMAN,MAGGIE RN @ [phone] 08/15/22 LEONARD,A   GFR, Estimated >60 >60 mL/min    Comment: (NOTE) Calculated using the CKD-EPI Creatinine Equation (2021)    Anion gap 3 (L) 5 - 15    Comment: Performed at [facility], [address] [place], [place] [address]  Magnesium     Status: None   Collection Time: 08/15/22  7:09 AM  Result Value Ref Range   Magnesium 1.8 1.7 - 2.4 mg/dL    Comment: Performed at [facility], [address], [place], [place] [address]  Phosphorus     Status: None   Collection Time: 08/15/22  7:09 AM  Result Value Ref Range   Phosphorus 3.2 2.5 - 4.6 mg/dL    Comment: Performed at [facility], [address], [place], [place] [address]  Triglycerides     Status: None   Collection Time: 08/15/22  7:09 AM  Result Value Ref Range   Triglycerides 118 <150 mg/dL    Comment: Performed at [facility], [address] [place], [place] [address]  Ammonia     Status: Abnormal   Collection Time: 08/15/22  7:09 AM  Result Value Ref Range   Ammonia 45 (H) 9 - 35 umol/L     Comment: Performed at [facility], [address], [place], [place] [address]  CBC     Status: Abnormal   Collection Time: 08/15/22  9:28 AM  Result Value Ref Range   WBC 14.7 (H) 4.0 - 10.5 K/uL   RBC 1.59 (L) 3.87 - 5.11 MIL/uL   Hemoglobin 4.9 (LL) 12.0 - 15.0 g/dL    Comment: REPEATED TO VERIFY VERIFIED WITH NEW SAMPLE THIS CRITICAL RESULT HAS VERIFIED AND BEEN CALLED TO MAGGIE CHRISMAN,RN BY ZELDA BEECH ON 05 01 2024 AT 1005, AND HAS BEEN READ BACK.     HCT 14.4 (L) 36.0 - 46.0 %   MCV 90.6 80.0 - 100.0 fL   MCH 30.8 26.0 - 34.0 pg   MCHC 34.0 30.0 - 36.0 g/dL   RDW 29.5 28.4 - 13.2 %   Platelets 281 150 - 400 K/uL   nRBC 0.6 (H) 0.0 - 0.2 %    Comment: Performed at [facility], [address], [place], [place] [address]  Prepare RBC (crossmatch)     Status: None   Collection Time: 08/15/22 10:18 AM  Result Value Ref Range   Order Confirmation      ORDER PROCESSED BY BLOOD BANK Performed at [facility], [address], [place], [place] [address]   Type and screen [facility]     Status: None (Preliminary result)   Collection Time: 08/15/22 10:47 AM  Result Value Ref Range   ABO/RH(D) B POS    Antibody Screen NEG    Sample Expiration 08/18/2022,2359    Unit Number [id number]    Blood Component Type RED CELLS,LR    Unit division 00    Status of Unit ISSUED    Transfusion Status OK TO TRANSFUSE    Crossmatch Result      Compatible Performed at [facility], [address] [place], [place] [address]    Unit Number [id number]    Blood Component Type RED CELLS,LR    Unit division 00    Status of Unit ISSUED    Transfusion Status OK TO TRANSFUSE    Crossmatch Result Compatible    Unit Number [id number]    Blood Component Type RED CELLS,LR    Unit division 00    Status of Unit ALLOCATED    Transfusion Status OK TO TRANSFUSE    Crossmatch Result Compatible   Glucose, capillary     Status: Abnormal   Collection  Time: 08/15/22 12:54 PM  Result Value Ref Range   Glucose-Capillary 133 (H) 70 - 99 mg/dL    Comment: Glucose reference range applies only to samples taken after fasting for at least 8 hours.   Comment 1 Notify RN    Comment 2 Document in Chart   Hemoglobin and hematocrit, blood     Status: Abnormal   Collection Time: 08/15/22  1:55 PM  Result Value Ref Range   Hemoglobin 7.1 (L) 12.0 - 15.0 g/dL    Comment: REPEATED TO VERIFY POST TRANSFUSION SPECIMEN    HCT 19.9 (L) 36.0 - 46.0 %    Comment: Performed at [facility], [address] [place], [place] [address]  Protime-INR     Status: Abnormal   Collection Time: 08/15/22  1:55 PM  Result Value Ref Range   Prothrombin Time 20.8 (H) 11.4 - 15.2 seconds   INR 1.8 (H) 0.8 - 1.2    Comment: (NOTE) INR goal varies based on device and disease states. Performed at [facility], [address], [place], [place] [address]   Magnesium     Status: None   Collection Time: 08/15/22  1:55 PM  Result Value Ref Range   Magnesium 1.8 1.7 - 2.4 mg/dL    Comment: Performed at [facility], [address] [place], [place] [address]  Phosphorus     Status: None   Collection Time: 08/15/22  1:55 PM  Result Value Ref Range   Phosphorus 3.8 2.5 - 4.6 mg/dL    Comment: Performed at [facility], [address] [place], [place] [address]  Hepatic function panel     Status: Abnormal   Collection Time: 08/15/22  1:55 PM  Result Value Ref Range   Total Protein 4.1 (L) 6.5 - 8.1 g/dL   Albumin <6.3 (L) 3.5 - 5.0 g/dL   AST 875 (H) 15 - 41 U/L   ALT 860 (H) 0 - 44 U/L   Alkaline Phosphatase 156 (H) 38 - 126 U/L   Total Bilirubin 26.7 (HH) 0.3 - 1.2 mg/dL    Comment: CRITICAL RESULT CALLED  TO, READ BACK BY AND VERIFIED WITH JENKINS,K RN @ [phone] 08/15/22 LEONARD,A   Bilirubin, Direct 16.2 (H) 0.0 - 0.2 mg/dL    Comment: RESULTS CONFIRMED BY MANUAL DILUTION   Indirect Bilirubin 10.5 (H) 0.3 - 0.9 mg/dL    Comment: Performed at  [facility], [address] [place], [place] [address]  Lactate dehydrogenase     Status: Abnormal   Collection Time: 08/15/22  1:55 PM  Result Value Ref Range   LDH 425 (H) 98 - 192 U/L    Comment: Performed at [facility], [address] [place], [place] [address]  Glucose, capillary     Status: Abnormal   Collection Time: 08/15/22  1:56 PM  Result Value Ref Range   Glucose-Capillary 139 (H) 70 - 99 mg/dL    Comment: Glucose reference range applies only to samples taken after fasting for at least 8 hours.  DIC Panel ONCE - STAT     Status: Abnormal   Collection Time: 08/15/22  2:35 PM  Result Value Ref Range   Prothrombin Time 19.8 (H) 11.4 - 15.2 seconds   INR 1.7 (H) 0.8 - 1.2    Comment: (NOTE) INR goal varies based on device and disease states.    aPTT 44 (H) 24 - 36 seconds    Comment:        IF BASELINE aPTT IS ELEVATED, SUGGEST PATIENT RISK ASSESSMENT BE USED TO DETERMINE APPROPRIATE ANTICOAGULANT THERAPY.    Fibrinogen 256 210 - 475 mg/dL    Comment: (NOTE) Fibrinogen results may be underestimated in patients receiving thrombolytic therapy.    D-Dimer, Quant >20.00 (H) 0.00 - 0.50 ug/mL-FEU    Comment: (NOTE) At the manufacturer cut-off value of 0.5 g/mL FEU, this assay has a negative predictive value of 95-100%.This assay is intended for use in conjunction with a clinical pretest probability (PTP) assessment model to exclude pulmonary embolism (PE) and deep venous thrombosis (DVT) in outpatients suspected of PE or DVT. Results should be correlated with clinical presentation.    Platelets 273 150 - 400 K/uL   Smear Review NO SCHISTOCYTES SEEN     Comment: Performed at [facility], [address], [place], [place] [address]  Hemoglobin and hematocrit, blood     Status: Abnormal   Collection Time: 08/15/22  2:35 PM  Result Value Ref Range   Hemoglobin 8.1 (L) 12.0 - 15.0 g/dL   HCT 27.2 (L) 53.6 - 64.4 %    Comment: Performed at [facility], [address], [place], [place] [address]  Glucose, capillary     Status: Abnormal   Collection Time: 08/15/22  3:59 PM  Result Value Ref Range   Glucose-Capillary 162 (H) 70 - 99 mg/dL    Comment: Glucose reference range applies only to samples taken after fasting for at least 8 hours.    CT ABDOMEN PELVIS W CONTRAST  Result Date: 08/15/2022 CLINICAL DATA:  Sepsis. Status post Hartmann's pouch. Leukocytosis and lactic acidosis EXAM: CT ABDOMEN AND PELVIS WITH CONTRAST TECHNIQUE: Multidetector CT imaging of the abdomen and pelvis was performed using the standard protocol following bolus administration of intravenous contrast. RADIATION DOSE REDUCTION: This exam was performed according to the departmental dose-optimization program which includes automated exposure control, adjustment of the mA and/or kV according to patient size and/or use of iterative reconstruction technique. CONTRAST:  75mL OMNIPAQUE IOHEXOL 350 MG/ML SOLN COMPARISON:  CT 08/10/2022.  Ultrasound 08/12/2022. FINDINGS: Lower chest: Small bilateral pleural  effusions are new. Adjacent parenchymal opacities. Atelectasis is favored over infiltrate but recommend follow-up. Breathing motion. Enteric tube in the stomach. Hepatobiliary: Known left hepatic lobe segment 2 hepatic hemangioma. Periportal edema. Patent portal vein. No other space-occupying liver lesion. Gallbladder is mildly distended. Pancreas: Mild atrophy of the pancreas.  No obvious pancreatic mass. Spleen: Lobular contours of the spleen with a central splenule. Adrenals/Urinary Tract: Adrenal glands are preserved. The kidneys are without enhancing mass or collecting system dilatation. Foley catheter in the urinary bladder with some dependent air. Stomach/Bowel: There is diffuse wall thickening of the stomach. Please correlate for gastritis. Again enteric tube with tip along the distal stomach. Small bowel is nondilated. Surgical changes seen. There is a left-sided  colostomy. The the transverse and ascending colon has wall thickening but are nondilated. Edema is greatest towards the ostomy site. Rectosigmoid stump is nondilated. Additional mild wall thickening in the area of the rectum as well. Small bowel is nondilated diffusely. No areas of pneumatosis. Vascular/Lymphatic: Diffuse vascular calcifications. Normal caliber aorta and IVC. Significant atherosclerotic plaque along the aorta at the level of the diaphragm eccentric towards the right. Once again there is some ectasia of the extreme distal descending thoracic aorta of up to 3.4 cm as on prior. Reproductive: Status post hysterectomy. No adnexal masses. Other: Scattered ascites identified. There is some high density material along components of the ascites in the pelvis on the right side as on prior. The amount of ascites is slightly increased from previous. Mesenteric stranding. Increasing anasarca. Midline skin staples along the pelvis. Musculoskeletal: Scattered degenerative changes of the spine and pelvis. IMPRESSION: Again surgical changes seen from diverting colostomy along the left side. Residual rectosigmoid stump. Multifocal areas of wall thickening along the colon as well as of the stomach. Please correlate for colitis and gastritis. No obstruction or free air. Increasing mild ascites.  There is increasing anasarca. Bilateral pleural effusions and adjacent opacities. Atelectasis is favored over infiltrate. Recommend follow-up. Enteric tube.  Foley catheter. Electronically Signed   By: Karen Kays M.D.   On: 08/15/2022 12:12   Overnight EEG with video  Result Date: 08/15/2022 Sarah Quest, MD     08/15/2022 10:53 AM Patient Name: Sarah Moore MRN: [id number] Epilepsy Attending: Charlsie Moore Referring Physician/Provider: Milon Dikes, MD Duration: 08/14/2022 1854 to 08/15/2022 5366  Patient history: 72yo F with ams getting eeg to evaluate for seizure  Level of alertness:  awake, asleep  AEDs during EEG  study: None  Technical aspects: This EEG study was done with scalp electrodes positioned according to the 10-20 International system of electrode placement. Electrical activity was reviewed with band pass filter of 1-70Hz , sensitivity of 7 uV/mm, display speed of 61mm/sec with a 60Hz  notched filter applied as appropriate. EEG data were recorded continuously and digitally stored.  Video monitoring was available and reviewed as appropriate.  Description: During awake state, no clear posterior dominant rhythm was seen. Sleep was characterized by sleep spindles (12 -14 Hz), maximal frontocentral region. EEG showed continuous generalized 3-5Hz  theta- delta slowing.  Intermittent generalized periodic discharges with triphasic morphology were also noted at 1-2 Hz, predominantly when awake/stimulated.  Hyperventilation and photic stimulation were not performed.    ABNORMALITY - Periodic discharges with triphasic morphology, generalized ( GPDs) - Continuous slow, generalized  IMPRESSION: This study showed generalized periodic discharges with triphasic morphology most likely toxic-metabolic causes. Additionally there is moderate diffuse encephalopathy.  No seizures were noted. EEG appears to be improving compared to previous day.  Sarah Moore   EEG adult  Result Date: 08/14/2022 Sarah Quest, MD     08/14/2022 11:40 AM Patient Name: Sarah Moore MRN: [id number] Epilepsy Attending: Charlsie Moore Referring Physician/Provider: Caryl Pina, MD Date: 08/14/2022 Duration: 26.08 mins Patient history: 72yo F with ams getting eeg to evaluate for seizure Level of alertness:  lethargic AEDs during EEG study: None Technical aspects: This EEG study was done with scalp electrodes positioned according to the 10-20 International system of electrode placement. Electrical activity was reviewed with band pass filter of 1-70Hz , sensitivity of 7 uV/mm, display speed of 48mm/sec with a 60Hz  notched filter applied as appropriate.  EEG data were recorded continuously and digitally stored.  Video monitoring was available and reviewed as appropriate. Description: EEG showed continuous generalized 2 to 3 Hz delta slowing.  Generalized periodic discharges with triphasic morphology were also noted at 2 to 2.5 Hz, rarely reaching 3 Hz.  Hyperventilation and photic stimulation were not performed.   ABNORMALITY - Periodic discharges with triphasic morphology, generalized ( GPDs) - Continuous slow, generalized IMPRESSION: This study showed generalized periodic discharges with triphasic morphology which is on the ictal-interictal continuum.  The morphology and frequency is typically associated with toxic Metabolic causes.  However the frequency reaches 2.5 to 3 Hz which could be interictal.  Consider Ativan challenge and long-term EEG monitoring. Additionally the study is suggestive of moderate to severe diffuse encephalopathy.  Dr. [place] and Dr Wilford Corner were notified. Sarah Moore   MR BRAIN WO CONTRAST  Result Date: 08/13/2022 CLINICAL DATA:  Neuro deficit, acute, stroke suspected. EXAM: MRI HEAD WITHOUT CONTRAST TECHNIQUE: Multiplanar, multiecho pulse sequences of the brain and surrounding structures were obtained without intravenous contrast. COMPARISON:  Stroke protocol CT/CTA/CTP 08/13/2022. FINDINGS: Brain: Motion degraded study. Within limits of motion artifact, no acute infarct or hemorrhage. No hydrocephalus or extra-axial collection. No mass or midline shift. Vascular: Normal flow voids. Skull and upper cervical spine: Normal marrow signal. Sinuses/Orbits: Unremarkable. Other: None. IMPRESSION: Motion degraded study. Within limits of motion artifact, no acute intracranial process. Electronically Signed   By: Orvan Falconer M.D.   On: 08/13/2022 20:53   CT ANGIO HEAD W OR WO CONTRAST  Result Date: 08/13/2022 CLINICAL DATA:  Neuro deficit, acute, stroke suspected. Right facial droop and aphasia. Unable to move lower extremity. EXAM:  CT ANGIOGRAPHY HEAD AND NECK CT PERFUSION BRAIN TECHNIQUE: Multidetector CT imaging of the head and neck was performed using the standard protocol during bolus administration of intravenous contrast. Multiplanar CT image reconstructions and MIPs were obtained to evaluate the vascular anatomy. Carotid stenosis measurements (when applicable) are obtained utilizing NASCET criteria, using the distal internal carotid diameter as the denominator. Multiphase CT imaging of the brain was performed following IV bolus contrast injection. Subsequent parametric perfusion maps were calculated using RAPID software. RADIATION DOSE REDUCTION: This exam was performed according to the departmental dose-optimization program which includes automated exposure control, adjustment of the mA and/or kV according to patient size and/or use of iterative reconstruction technique. CONTRAST:  OMNIPAQUE IOHEXOL 350 MG/ML SOLN COMPARISON:  Same-day head CT. FINDINGS: CTA NECK FINDINGS Aortic arch: Two-vessel arch configuration with common origin of the right brachiocephalic and left common carotid arteries. Atherosclerotic calcifications of the aortic arch and arch vessel origins. Arch vessel origins are patent. Right carotid system: No evidence of dissection, stenosis (50% or greater) or occlusion. Mild calcified plaque along the right carotid bulb and proximal right cervical ICA. Left carotid system: No evidence of dissection,  stenosis (50% or greater) or occlusion. Mild calcified plaque along the left carotid bulb and proximal left cervical ICA. Vertebral arteries: Left dominant.  Patent to the skull base. Skeleton: No suspicious bone lesions. Other neck: Unremarkable. Upper chest: Small bilateral pleural effusions. Review of the MIP images confirms the above findings CTA HEAD FINDINGS Anterior circulation: Calcified plaque along the carotid siphons without hemodynamically significant stenosis. The proximal ACAs and MCAs are patent without  stenosis or aneurysm. Distal branches are symmetric. Posterior circulation: The right vertebral artery functionally terminates in PICA. The intracranial portion of the left vertebral artery and basilar artery are patent without stenosis or aneurysm. The SCAs, AICAs and PICAs are patent proximally. The PCAs are patent proximally without stenosis or aneurysm. Distal branches are symmetric. Venous sinuses: As permitted by contrast timing, patent. Anatomic variants: None. Review of the MIP images confirms the above findings CT Brain Perfusion Findings: ASPECTS: 10 CBF (<30%) Volume: 0mL Perfusion (Tmax>6.0s) volume: 0mL Mismatch Volume: 0mL Infarction Location: Not applicable. IMPRESSION: 1. No large vessel occlusion or hemodynamically significant stenosis in the head or neck vessels. 2. No evidence of core infarct or penumbra on CT perfusion. 3. Small bilateral pleural effusions. 4. Aortic Atherosclerosis (ICD10-I70.0). Electronically Signed   By: Orvan Falconer M.D.   On: 08/13/2022 17:30   CT ANGIO NECK W OR WO CONTRAST  Result Date: 08/13/2022 CLINICAL DATA:  Neuro deficit, acute, stroke suspected. Right facial droop and aphasia. Unable to move lower extremity. EXAM: CT ANGIOGRAPHY HEAD AND NECK CT PERFUSION BRAIN TECHNIQUE: Multidetector CT imaging of the head and neck was performed using the standard protocol during bolus administration of intravenous contrast. Multiplanar CT image reconstructions and MIPs were obtained to evaluate the vascular anatomy. Carotid stenosis measurements (when applicable) are obtained utilizing NASCET criteria, using the distal internal carotid diameter as the denominator. Multiphase CT imaging of the brain was performed following IV bolus contrast injection. Subsequent parametric perfusion maps were calculated using RAPID software. RADIATION DOSE REDUCTION: This exam was performed according to the departmental dose-optimization program which includes automated exposure control,  adjustment of the mA and/or kV according to patient size and/or use of iterative reconstruction technique. CONTRAST:  OMNIPAQUE IOHEXOL 350 MG/ML SOLN COMPARISON:  Same-day head CT. FINDINGS: CTA NECK FINDINGS Aortic arch: Two-vessel arch configuration with common origin of the right brachiocephalic and left common carotid arteries. Atherosclerotic calcifications of the aortic arch and arch vessel origins. Arch vessel origins are patent. Right carotid system: No evidence of dissection, stenosis (50% or greater) or occlusion. Mild calcified plaque along the right carotid bulb and proximal right cervical ICA. Left carotid system: No evidence of dissection, stenosis (50% or greater) or occlusion. Mild calcified plaque along the left carotid bulb and proximal left cervical ICA. Vertebral arteries: Left dominant.  Patent to the skull base. Skeleton: No suspicious bone lesions. Other neck: Unremarkable. Upper chest: Small bilateral pleural effusions. Review of the MIP images confirms the above findings CTA HEAD FINDINGS Anterior circulation: Calcified plaque along the carotid siphons without hemodynamically significant stenosis. The proximal ACAs and MCAs are patent without stenosis or aneurysm. Distal branches are symmetric. Posterior circulation: The right vertebral artery functionally terminates in PICA. The intracranial portion of the left vertebral artery and basilar artery are patent without stenosis or aneurysm. The SCAs, AICAs and PICAs are patent proximally. The PCAs are patent proximally without stenosis or aneurysm. Distal branches are symmetric. Venous sinuses: As permitted by contrast timing, patent. Anatomic variants: None. Review of the MIP images  confirms the above findings CT Brain Perfusion Findings: ASPECTS: 10 CBF (<30%) Volume: 0mL Perfusion (Tmax>6.0s) volume: 0mL Mismatch Volume: 0mL Infarction Location: Not applicable. IMPRESSION: 1. No large vessel occlusion or hemodynamically significant  stenosis in the head or neck vessels. 2. No evidence of core infarct or penumbra on CT perfusion. 3. Small bilateral pleural effusions. 4. Aortic Atherosclerosis (ICD10-I70.0). Electronically Signed   By: Orvan Falconer M.D.   On: 08/13/2022 17:30   CT CEREBRAL PERFUSION W CONTRAST  Result Date: 08/13/2022 CLINICAL DATA:  Neuro deficit, acute, stroke suspected. Right facial droop and aphasia. Unable to move lower extremity. EXAM: CT ANGIOGRAPHY HEAD AND NECK CT PERFUSION BRAIN TECHNIQUE: Multidetector CT imaging of the head and neck was performed using the standard protocol during bolus administration of intravenous contrast. Multiplanar CT image reconstructions and MIPs were obtained to evaluate the vascular anatomy. Carotid stenosis measurements (when applicable) are obtained utilizing NASCET criteria, using the distal internal carotid diameter as the denominator. Multiphase CT imaging of the brain was performed following IV bolus contrast injection. Subsequent parametric perfusion maps were calculated using RAPID software. RADIATION DOSE REDUCTION: This exam was performed according to the departmental dose-optimization program which includes automated exposure control, adjustment of the mA and/or kV according to patient size and/or use of iterative reconstruction technique. CONTRAST:  OMNIPAQUE IOHEXOL 350 MG/ML SOLN COMPARISON:  Same-day head CT. FINDINGS: CTA NECK FINDINGS Aortic arch: Two-vessel arch configuration with common origin of the right brachiocephalic and left common carotid arteries. Atherosclerotic calcifications of the aortic arch and arch vessel origins. Arch vessel origins are patent. Right carotid system: No evidence of dissection, stenosis (50% or greater) or occlusion. Mild calcified plaque along the right carotid bulb and proximal right cervical ICA. Left carotid system: No evidence of dissection, stenosis (50% or greater) or occlusion. Mild calcified plaque along the left carotid  bulb and proximal left cervical ICA. Vertebral arteries: Left dominant.  Patent to the skull base. Skeleton: No suspicious bone lesions. Other neck: Unremarkable. Upper chest: Small bilateral pleural effusions. Review of the MIP images confirms the above findings CTA HEAD FINDINGS Anterior circulation: Calcified plaque along the carotid siphons without hemodynamically significant stenosis. The proximal ACAs and MCAs are patent without stenosis or aneurysm. Distal branches are symmetric. Posterior circulation: The right vertebral artery functionally terminates in PICA. The intracranial portion of the left vertebral artery and basilar artery are patent without stenosis or aneurysm. The SCAs, AICAs and PICAs are patent proximally. The PCAs are patent proximally without stenosis or aneurysm. Distal branches are symmetric. Venous sinuses: As permitted by contrast timing, patent. Anatomic variants: None. Review of the MIP images confirms the above findings CT Brain Perfusion Findings: ASPECTS: 10 CBF (<30%) Volume: 0mL Perfusion (Tmax>6.0s) volume: 0mL Mismatch Volume: 0mL Infarction Location: Not applicable. IMPRESSION: 1. No large vessel occlusion or hemodynamically significant stenosis in the head or neck vessels. 2. No evidence of core infarct or penumbra on CT perfusion. 3. Small bilateral pleural effusions. 4. Aortic Atherosclerosis (ICD10-I70.0). Electronically Signed   By: Orvan Falconer M.D.   On: 08/13/2022 17:30   CT HEAD CODE STROKE WO CONTRAST  Result Date: 08/13/2022 CLINICAL DATA:  Code stroke. Neuro deficit, acute, stroke suspected. EXAM: CT HEAD WITHOUT CONTRAST TECHNIQUE: Contiguous axial images were obtained from the base of the skull through the vertex without intravenous contrast. RADIATION DOSE REDUCTION: This exam was performed according to the departmental dose-optimization program which includes automated exposure control, adjustment of the mA and/or kV according to patient size  and/or use of  iterative reconstruction technique. COMPARISON:  None Available. FINDINGS: Brain: No acute intracranial hemorrhage. Gray-white differentiation is preserved. No hydrocephalus or extra-axial collection. No mass effect or midline shift. Vascular: No hyperdense vessel or unexpected calcification. Skull: No calvarial fracture or suspicious bone lesion. Skull base is unremarkable. Sinuses/Orbits: Unremarkable. Other: None. ASPECTS ([place] Stroke Program Early CT Score) - Ganglionic level infarction (caudate, lentiform nuclei, internal capsule, insula, M1-M3 cortex): 7 - Supraganglionic infarction (M4-M6 cortex): 3 Total score (0-10 with 10 being normal): 10 IMPRESSION: No acute intracranial hemorrhage or evidence of evolving large vessel territory infarct. ASPECT score is 10. Code stroke imaging results were communicated on 08/13/2022 at 4:52 pm to provider Dr. Otelia Limes via secure text paging. Electronically Signed   By: Orvan Falconer M.D.   On: 08/13/2022 16:54    Review of Systems  Unable to perform ROS: Mental status change   Blood pressure 124/71, pulse 92, temperature 98.2 F (36.8 C), temperature source Oral, resp. rate 18, height 5' (1.524 m), weight 62 kg, SpO2 100 %. Physical Exam Constitutional:      General: She is not in acute distress.    Appearance: She is ill-appearing and toxic-appearing.  HENT:     Head: Normocephalic and atraumatic.     Mouth/Throat:     Mouth: Mucous membranes are dry.  Eyes:     General: Scleral icterus present.     Extraocular Movements: Extraocular movements intact.     Pupils: Pupils are equal, round, and reactive to light.  Cardiovascular:     Rate and Rhythm: Normal rate and regular rhythm.  Abdominal:     General: There is no distension.     Palpations: Abdomen is soft.     Tenderness: There is no abdominal tenderness. There is no guarding.     Comments: Patient has a colostomy  Musculoskeletal:     Cervical back: Neck supple.  Skin:    General: Skin  is warm and dry.  Neurological:     Mental Status: She is lethargic, disoriented and confused.   Assessment/Plan: 1) Severe anemia with circulatory shock along with active lactic acidosis and metabolic acidosis-there is a packed red blood cells ordered by CCM-repeat hemoglobin 8.1 g/dL.  There is not much to be done from a GI standpoint at the present time. Please call us if further assistance is needed. 2) History of colocolonic intussusception patient is status post colectomy with colostomy. 3) Hyperbilirubinemia secondary secretory shock and severe volume depletion; total bilirubin is 28.1. 4) Acute respiratory failure with hypoxia. 5) Acute renal failure-secondary to ATN hypotension secondary to shock. 6) Elevated troponins secondary to demand ischemia in the setting of circulatory shock. As per my discussion with Dr. Blake Divine agree with palliative care consult to discuss goals of care due to her deteriorating condition. Charna Elizabeth 08/15/2022, 4:46 PM

## 2022-08-15 NOTE — Progress Notes (Signed)
   08/15/22 1100  Assess: MEWS Score  BP (!) 128/54  MAP (mmHg) 75  ECG Heart Rate (!) 113  Resp 18  Assess: MEWS Score  MEWS Temp 0  MEWS Systolic 0  MEWS Pulse 2  MEWS RR 0  MEWS LOC 0  MEWS Score 2  MEWS Score Color Yellow  Assess: if the MEWS score is Yellow or Red  Were vital signs taken at a resting state? Yes  Focused Assessment No change from prior assessment  Does the patient meet 2 or more of the SIRS criteria? Yes  Does the patient have a confirmed or suspected source of infection? No  MEWS guidelines implemented  Yes, yellow  Treat  MEWS Interventions Considered administering scheduled or prn medications/treatments as ordered  Take Vital Signs  Increase Vital Sign Frequency  Yellow: Q2hr x1, continue Q4hrs until patient remains green for 12hrs  Escalate  MEWS: Escalate Yellow: Discuss with charge nurse and consider notifying provider and/or RRT  Notify: Charge Nurse/RN  Name of Charge Nurse/RN Notified French Ana, RN  Provider Notification  Provider Name/Title Blake Divine  Date Provider Notified 08/15/22  Time Provider Notified 1000  Method of Notification Face-to-face  Notification Reason Critical Result  Date of Provider Response 08/15/22  Time of Provider Response 1000  Assess: SIRS CRITERIA  SIRS Temperature  0  SIRS Pulse 1  SIRS Respirations  0  SIRS WBC 1  SIRS Score Sum  2

## 2022-08-15 NOTE — Progress Notes (Signed)
vLTM discontinued  No skin breakdown noted at all skin sites  [facility] notified 

## 2022-08-15 NOTE — Progress Notes (Addendum)
NAME:  Sarah Moore, MRN:  [id number], DOB:  09-04-1950, LOS: 9 ADMISSION DATE:  08/06/2022, CONSULTATION DATE:  4/26 REFERRING MD:  [place] , CHIEF COMPLAINT:  hypotension and altered mental status    BRIEF  72 year old female w/ hx as outlined below. Presented on 4/22 to ER w/ cc abd pain, constipation and decreased appetite. Had been seen in urgent care 2 days prior and treated w/ Linzess and fleet enema. CT abd/pelvis: showed colocolonic intussusception at the jxn of descending and sigmoid colon felt d/t intraluminal lipoma. Surgery was consulted. Initial recs: bowel rest, NPO w/ plan to bring to OR for bowel resxn.   Pertinent  Medical History  Chronic constipation, remote DVT (no longer on [facility]), HL  has a past medical history of Acid reflux, Arthritis, Chest pain, Fatigue, GERD (gastroesophageal reflux disease), and Hyperlipidemia.  Significant Hospital Events: Including procedures, antibiotic start and stop dates in addition to other pertinent events   4/22 admit with constipation and ABD pain  CT abd/pelvis revealed colo-colic intussusception beginning at the junction of the descending and sigmoid colon. Lead point is likely a large intraluminal lipoma, which has increased in size since prior study measuring 6.4 x 4.5 cm on today's exam. There is mild mural thickening of the intussusceptum, without frank evidence of bowel ischemia.  4/23 partial colectomy and end colostomy 4/24-4/25 nausea w/ clears 4/26 confused, hypotensive moved to ICU. 1.5 liters saline. Started on NE gtt. Gluc 17, treated. Improved Mental status some. No sig abd pain on exam rated pain at 3. Surgical site clean and unremarkable. Lactic 9  4/29 New right facial drop. Dysarthria and gaze preference. Code stroke called, Head CT negative  4/30 transferred to [facility] for ongoing neurological workup including need for long term EEG 5/1 critical care called back to assist in management given hemoglobin dropped from 7.2-4.9,  poor mentation, and ongoing hyperbilirubinemia   Interim History / Subjective:  Able to state name and intermittently follow simple commands   Objective   Blood pressure 118/62, pulse (!) 108, temperature 98.3 F (36.8 C), temperature source Axillary, resp. rate (!) 21, height 5' (1.524 m), weight 62 kg, SpO2 94 %.        Intake/Output Summary (Last 24 hours) at 08/15/2022 1136 Last data filed at 08/15/2022 1000 Gross per 24 hour  Intake 912.86 ml  Output 950 ml  Net -37.14 ml    Filed Weights   08/07/22 1014 08/10/22 1107 08/14/22 0500  Weight: 52.2 kg 50.8 kg 62 kg    Examination: General: Acute on chronically ill appearing elderly female lying in bed on mechanical ventilation, in NAD HEENT: Echo/AT, MM pink/moist, PERRL,  Neuro: Alert and oriented x1 CV: s1s2 regular rate and rhythm, no murmur, rubs, or gallops,  PULM:  Diminished bilaterally, no increased work of breathing, no added breath sounds  GI: soft, bowel sounds active in all 4 quadrants, non-tender, non-distended Extremities: warm/dry, generalized anasarca  Skin: no rashes or lesions  Resolved Hospital Problem list   AKI Circulatory shock  Lactic acidosis  Metabolic acidosis  Acute hypoxic respiratory failure  Elevated troponin  Assessment & Plan:  Acute hepatic and/or metabolic encephalopathy -Extensive neurologic workup done thus far including negative, head CT, MRI, and LTM. Lactulose remains elevated at 45 P: Primary management per neurology  Maintain neuro protective measures Nutrition and bowel regiment  Seizure precautions  Aspirations precautions  Correction of metabolic process as below   Undifferentiated shock - improving  -Concern for GI  process, now off pressors and lactic has cleared as of 5/1 P: Continue Zosyn, tentatively plan to cover for 7 days   Continue to trend CBC and fever curve  Follow MRCP   Colonic intussusception now s/p sigmoid colectomy/colostomy 4/23 -Repeat CT ABD 5/1  multifocal colon and stomach thickening with mild ascites and anasarca  P: Management per Surgery  Continue TNA per surgery now has cortrack start feeds per surgery  Local wound care  Optimize nurtition  Repeat CT ABD as above   Elevated LFTs secondary to shock liver vs obstructive pathology  Hyperbilirubinemia  -Mildly distended gallbladder on CT, sludge in gallbladder on [place] 4/28, can not rule out obstructing stone at this time  P: Obtain MRCP   GI consulted Trend LFTs  Avoid hepatotoxins  Continue lactulose   Anasarca with global volume overload  Hypoalonemia   -Per I&O patient is +7L thus far for admission. Albumin <1.5 P: Start to diurese today with close monitoring of renal function  Strict intake and output  Continue Foley   Questionable GI bleed  -Patient with large BM in ostomy on 4/28 with dark tarry appearing stool.  No further blood noted in ostomy since.  Anemia  -Significant drop from 7.2 to 4.9 as of 5/1. No signs of external or radiographic signs of bleeding. Patient also remains hemodynamically stable  P: Transfuse 2 units PRBC and recheck H&H Hgb goal > 7 Continue to monitor for signs of bleeding  Pharmacologic DVT prophylaxis remains on hold  Continue BID PPI   Hypokalemia  P: Supplement  Trend Bmet   Best Practice (right click and "Reselect all SmartList Selections" daily)   Diet/type: Regular consistency (see orders) DVT prophylaxis: LMWH GI prophylaxis: N/A Lines: N/A Foley:  N/A Code Status:  full code Last date of multidisciplinary goals of care discussion Pending   CRITICAL CARE Performed by: Helmut Hennon D. Harris  Total critical care time: 45 minutes  Critical care time was exclusive of separately billable procedures and treating other patients.  Critical care was necessary to treat or prevent imminent or life-threatening deterioration.  Critical care was time spent personally by me on the following activities: development of treatment  plan with patient and/or surrogate as well as nursing, discussions with consultants, evaluation of patient's response to treatment, examination of patient, obtaining history from patient or surrogate, ordering and performing treatments and interventions, ordering and review of laboratory studies, ordering and review of radiographic studies, pulse oximetry and re-evaluation of patient's condition.  December Hedtke D. Harris, NP-C [facility] Personal contact information can be found on Amion  If no contact or response made please call [phone] 08/15/2022, 11:47 AM

## 2022-08-15 NOTE — Progress Notes (Signed)
SLP Cancellation Note  Patient Details Name: Sarah Moore MRN: [id number] DOB: [birth year]   Cancelled treatment:       Reason Eval/Treat Not Completed: Medical issues which prohibited therapy NPO for evaluation of large bowel obstruction. SLP will follow for readiness.   Angela Nevin, MA, CCC-SLP Speech Therapy

## 2022-08-15 NOTE — Progress Notes (Signed)
8 Days Post-Op   Subjective/Chief Complaint: Patient still lethargic, occasionally speaking but speech is garbled. Loved one at the bedside says that she received some meds this Am that made her groggy. Tolerating some sips but not eating much.    Objective: Vital signs in last 24 hours: Temp:  [98.1 F (36.7 C)-99 F (37.2 C)] 98.4 F (36.9 C) (05/01 0730) Pulse Rate:  [83-112] 108 (05/01 0730) Resp:  [11-17] 16 (05/01 0730) BP: (116-153)/(61-76) 128/67 (05/01 0730) SpO2:  [98 %-100 %] 98 % (05/01 0730) Last BM Date : 08/14/22  Intake/Output from previous day: 04/30 0701 - 05/01 0700 In: 912.9 [P.O.:120; I.V.:665.9; IV Piggyback:127] Out: 825 [Urine:600; Stool:225] Intake/Output this shift: No intake/output data recorded.  Abd: stoma pink and functional.  Pouch with good seal, wound without infection- staples intact, minimally tender- appropraite. Without guarding.  Lab Results:  Recent Labs    08/13/22 1726 08/14/22 0548  WBC 17.9* 19.8*  HGB 7.5* 7.2*  HCT 22.9* 22.6*  PLT 317 320   BMET Recent Labs    08/14/22 0548 08/15/22 0709  NA 140 144  K 4.2 3.4*  CL 104 113*  CO2 29 28  GLUCOSE 190* 137*  BUN 25* 21  CREATININE 0.71 0.79  CALCIUM 8.0* 8.2*   PT/INR Recent Labs    08/13/22 1726  LABPROT 21.8*  INR 1.9*   ABG No results for input(s): "PHART", "HCO3" in the last 72 hours.  Invalid input(s): "PCO2", "PO2"   Studies/Results: Overnight EEG with video  Result Date: 08/15/2022 Charlsie Quest, MD     08/15/2022  8:42 AM Patient Name: SANAYA GWILLIAM MRN: [id number] Epilepsy Attending: Charlsie Quest Referring Physician/Provider: Milon Dikes, MD Duration: 08/14/2022 1854 to 08/15/2022 0840  Patient history: 72yo F with ams getting eeg to evaluate for seizure  Level of alertness:  awake, asleep  AEDs during EEG study: None  Technical aspects: This EEG study was done with scalp electrodes positioned according to the 10-20 International system of  electrode placement. Electrical activity was reviewed with band pass filter of 1-70Hz , sensitivity of 7 uV/mm, display speed of 7mm/sec with a 60Hz  notched filter applied as appropriate. EEG data were recorded continuously and digitally stored.  Video monitoring was available and reviewed as appropriate.  Description: During awake state, no clear posterior dominant rhythm was seen. Sleep was characterized by sleep spindles (12 -14 Hz), maximal frontocentral region. EEG showed continuous generalized 3-5Hz  theta- delta slowing.  Intermittent generalized periodic discharges with triphasic morphology were also noted at 1-2 Hz, predominantly when awake/stimulated.  Hyperventilation and photic stimulation were not performed.    ABNORMALITY - Periodic discharges with triphasic morphology, generalized ( GPDs) - Continuous slow, generalized  IMPRESSION: This study showed generalized periodic discharges with triphasic morphology most likely toxic-metabolic causes. Additionally there is moderate diffuse encephalopathy.  No seizures were noted. EEG appears to be improving compared to previous day.  Charlsie Quest   EEG adult  Result Date: 08/14/2022 Charlsie Quest, MD     08/14/2022 11:40 AM Patient Name: SUJEY GUNDRY MRN: [id number] Epilepsy Attending: Charlsie Quest Referring Physician/Provider: Caryl Pina, MD Date: 08/14/2022 Duration: 26.08 mins Patient history: 72yo F with ams getting eeg to evaluate for seizure Level of alertness:  lethargic AEDs during EEG study: None Technical aspects: This EEG study was done with scalp electrodes positioned according to the 10-20 International system of electrode placement. Electrical activity was reviewed with band pass filter of 1-70Hz , sensitivity of 7 uV/mm,  display speed of 72mm/sec with a 60Hz  notched filter applied as appropriate. EEG data were recorded continuously and digitally stored.  Video monitoring was available and reviewed as appropriate. Description: EEG  showed continuous generalized 2 to 3 Hz delta slowing.  Generalized periodic discharges with triphasic morphology were also noted at 2 to 2.5 Hz, rarely reaching 3 Hz.  Hyperventilation and photic stimulation were not performed.   ABNORMALITY - Periodic discharges with triphasic morphology, generalized ( GPDs) - Continuous slow, generalized IMPRESSION: This study showed generalized periodic discharges with triphasic morphology which is on the ictal-interictal continuum.  The morphology and frequency is typically associated with toxic Metabolic causes.  However the frequency reaches 2.5 to 3 Hz which could be interictal.  Consider Ativan challenge and long-term EEG monitoring. Additionally the study is suggestive of moderate to severe diffuse encephalopathy.  Dr. [place] and Dr Wilford Corner were notified. Charlsie Quest   MR BRAIN WO CONTRAST  Result Date: 08/13/2022 CLINICAL DATA:  Neuro deficit, acute, stroke suspected. EXAM: MRI HEAD WITHOUT CONTRAST TECHNIQUE: Multiplanar, multiecho pulse sequences of the brain and surrounding structures were obtained without intravenous contrast. COMPARISON:  Stroke protocol CT/CTA/CTP 08/13/2022. FINDINGS: Brain: Motion degraded study. Within limits of motion artifact, no acute infarct or hemorrhage. No hydrocephalus or extra-axial collection. No mass or midline shift. Vascular: Normal flow voids. Skull and upper cervical spine: Normal marrow signal. Sinuses/Orbits: Unremarkable. Other: None. IMPRESSION: Motion degraded study. Within limits of motion artifact, no acute intracranial process. Electronically Signed   By: Orvan Falconer M.D.   On: 08/13/2022 20:53   CT ANGIO HEAD W OR WO CONTRAST  Result Date: 08/13/2022 CLINICAL DATA:  Neuro deficit, acute, stroke suspected. Right facial droop and aphasia. Unable to move lower extremity. EXAM: CT ANGIOGRAPHY HEAD AND NECK CT PERFUSION BRAIN TECHNIQUE: Multidetector CT imaging of the head and neck was performed using the  standard protocol during bolus administration of intravenous contrast. Multiplanar CT image reconstructions and MIPs were obtained to evaluate the vascular anatomy. Carotid stenosis measurements (when applicable) are obtained utilizing NASCET criteria, using the distal internal carotid diameter as the denominator. Multiphase CT imaging of the brain was performed following IV bolus contrast injection. Subsequent parametric perfusion maps were calculated using RAPID software. RADIATION DOSE REDUCTION: This exam was performed according to the departmental dose-optimization program which includes automated exposure control, adjustment of the mA and/or kV according to patient size and/or use of iterative reconstruction technique. CONTRAST:  OMNIPAQUE IOHEXOL 350 MG/ML SOLN COMPARISON:  Same-day head CT. FINDINGS: CTA NECK FINDINGS Aortic arch: Two-vessel arch configuration with common origin of the right brachiocephalic and left common carotid arteries. Atherosclerotic calcifications of the aortic arch and arch vessel origins. Arch vessel origins are patent. Right carotid system: No evidence of dissection, stenosis (50% or greater) or occlusion. Mild calcified plaque along the right carotid bulb and proximal right cervical ICA. Left carotid system: No evidence of dissection, stenosis (50% or greater) or occlusion. Mild calcified plaque along the left carotid bulb and proximal left cervical ICA. Vertebral arteries: Left dominant.  Patent to the skull base. Skeleton: No suspicious bone lesions. Other neck: Unremarkable. Upper chest: Small bilateral pleural effusions. Review of the MIP images confirms the above findings CTA HEAD FINDINGS Anterior circulation: Calcified plaque along the carotid siphons without hemodynamically significant stenosis. The proximal ACAs and MCAs are patent without stenosis or aneurysm. Distal branches are symmetric. Posterior circulation: The right vertebral artery functionally terminates  in PICA. The intracranial portion of the left vertebral  artery and basilar artery are patent without stenosis or aneurysm. The SCAs, AICAs and PICAs are patent proximally. The PCAs are patent proximally without stenosis or aneurysm. Distal branches are symmetric. Venous sinuses: As permitted by contrast timing, patent. Anatomic variants: None. Review of the MIP images confirms the above findings CT Brain Perfusion Findings: ASPECTS: 10 CBF (<30%) Volume: 0mL Perfusion (Tmax>6.0s) volume: 0mL Mismatch Volume: 0mL Infarction Location: Not applicable. IMPRESSION: 1. No large vessel occlusion or hemodynamically significant stenosis in the head or neck vessels. 2. No evidence of core infarct or penumbra on CT perfusion. 3. Small bilateral pleural effusions. 4. Aortic Atherosclerosis (ICD10-I70.0). Electronically Signed   By: Orvan Falconer M.D.   On: 08/13/2022 17:30   CT ANGIO NECK W OR WO CONTRAST  Result Date: 08/13/2022 CLINICAL DATA:  Neuro deficit, acute, stroke suspected. Right facial droop and aphasia. Unable to move lower extremity. EXAM: CT ANGIOGRAPHY HEAD AND NECK CT PERFUSION BRAIN TECHNIQUE: Multidetector CT imaging of the head and neck was performed using the standard protocol during bolus administration of intravenous contrast. Multiplanar CT image reconstructions and MIPs were obtained to evaluate the vascular anatomy. Carotid stenosis measurements (when applicable) are obtained utilizing NASCET criteria, using the distal internal carotid diameter as the denominator. Multiphase CT imaging of the brain was performed following IV bolus contrast injection. Subsequent parametric perfusion maps were calculated using RAPID software. RADIATION DOSE REDUCTION: This exam was performed according to the departmental dose-optimization program which includes automated exposure control, adjustment of the mA and/or kV according to patient size and/or use of iterative reconstruction technique. CONTRAST:   OMNIPAQUE IOHEXOL 350 MG/ML SOLN COMPARISON:  Same-day head CT. FINDINGS: CTA NECK FINDINGS Aortic arch: Two-vessel arch configuration with common origin of the right brachiocephalic and left common carotid arteries. Atherosclerotic calcifications of the aortic arch and arch vessel origins. Arch vessel origins are patent. Right carotid system: No evidence of dissection, stenosis (50% or greater) or occlusion. Mild calcified plaque along the right carotid bulb and proximal right cervical ICA. Left carotid system: No evidence of dissection, stenosis (50% or greater) or occlusion. Mild calcified plaque along the left carotid bulb and proximal left cervical ICA. Vertebral arteries: Left dominant.  Patent to the skull base. Skeleton: No suspicious bone lesions. Other neck: Unremarkable. Upper chest: Small bilateral pleural effusions. Review of the MIP images confirms the above findings CTA HEAD FINDINGS Anterior circulation: Calcified plaque along the carotid siphons without hemodynamically significant stenosis. The proximal ACAs and MCAs are patent without stenosis or aneurysm. Distal branches are symmetric. Posterior circulation: The right vertebral artery functionally terminates in PICA. The intracranial portion of the left vertebral artery and basilar artery are patent without stenosis or aneurysm. The SCAs, AICAs and PICAs are patent proximally. The PCAs are patent proximally without stenosis or aneurysm. Distal branches are symmetric. Venous sinuses: As permitted by contrast timing, patent. Anatomic variants: None. Review of the MIP images confirms the above findings CT Brain Perfusion Findings: ASPECTS: 10 CBF (<30%) Volume: 0mL Perfusion (Tmax>6.0s) volume: 0mL Mismatch Volume: 0mL Infarction Location: Not applicable. IMPRESSION: 1. No large vessel occlusion or hemodynamically significant stenosis in the head or neck vessels. 2. No evidence of core infarct or penumbra on CT perfusion. 3. Small bilateral pleural  effusions. 4. Aortic Atherosclerosis (ICD10-I70.0). Electronically Signed   By: Orvan Falconer M.D.   On: 08/13/2022 17:30   CT CEREBRAL PERFUSION W CONTRAST  Result Date: 08/13/2022 CLINICAL DATA:  Neuro deficit, acute, stroke suspected. Right facial droop and aphasia.  Unable to move lower extremity. EXAM: CT ANGIOGRAPHY HEAD AND NECK CT PERFUSION BRAIN TECHNIQUE: Multidetector CT imaging of the head and neck was performed using the standard protocol during bolus administration of intravenous contrast. Multiplanar CT image reconstructions and MIPs were obtained to evaluate the vascular anatomy. Carotid stenosis measurements (when applicable) are obtained utilizing NASCET criteria, using the distal internal carotid diameter as the denominator. Multiphase CT imaging of the brain was performed following IV bolus contrast injection. Subsequent parametric perfusion maps were calculated using RAPID software. RADIATION DOSE REDUCTION: This exam was performed according to the departmental dose-optimization program which includes automated exposure control, adjustment of the mA and/or kV according to patient size and/or use of iterative reconstruction technique. CONTRAST:  OMNIPAQUE IOHEXOL 350 MG/ML SOLN COMPARISON:  Same-day head CT. FINDINGS: CTA NECK FINDINGS Aortic arch: Two-vessel arch configuration with common origin of the right brachiocephalic and left common carotid arteries. Atherosclerotic calcifications of the aortic arch and arch vessel origins. Arch vessel origins are patent. Right carotid system: No evidence of dissection, stenosis (50% or greater) or occlusion. Mild calcified plaque along the right carotid bulb and proximal right cervical ICA. Left carotid system: No evidence of dissection, stenosis (50% or greater) or occlusion. Mild calcified plaque along the left carotid bulb and proximal left cervical ICA. Vertebral arteries: Left dominant.  Patent to the skull base. Skeleton: No suspicious  bone lesions. Other neck: Unremarkable. Upper chest: Small bilateral pleural effusions. Review of the MIP images confirms the above findings CTA HEAD FINDINGS Anterior circulation: Calcified plaque along the carotid siphons without hemodynamically significant stenosis. The proximal ACAs and MCAs are patent without stenosis or aneurysm. Distal branches are symmetric. Posterior circulation: The right vertebral artery functionally terminates in PICA. The intracranial portion of the left vertebral artery and basilar artery are patent without stenosis or aneurysm. The SCAs, AICAs and PICAs are patent proximally. The PCAs are patent proximally without stenosis or aneurysm. Distal branches are symmetric. Venous sinuses: As permitted by contrast timing, patent. Anatomic variants: None. Review of the MIP images confirms the above findings CT Brain Perfusion Findings: ASPECTS: 10 CBF (<30%) Volume: 0mL Perfusion (Tmax>6.0s) volume: 0mL Mismatch Volume: 0mL Infarction Location: Not applicable. IMPRESSION: 1. No large vessel occlusion or hemodynamically significant stenosis in the head or neck vessels. 2. No evidence of core infarct or penumbra on CT perfusion. 3. Small bilateral pleural effusions. 4. Aortic Atherosclerosis (ICD10-I70.0). Electronically Signed   By: Orvan Falconer M.D.   On: 08/13/2022 17:30   CT HEAD CODE STROKE WO CONTRAST  Result Date: 08/13/2022 CLINICAL DATA:  Code stroke. Neuro deficit, acute, stroke suspected. EXAM: CT HEAD WITHOUT CONTRAST TECHNIQUE: Contiguous axial images were obtained from the base of the skull through the vertex without intravenous contrast. RADIATION DOSE REDUCTION: This exam was performed according to the departmental dose-optimization program which includes automated exposure control, adjustment of the mA and/or kV according to patient size and/or use of iterative reconstruction technique. COMPARISON:  None Available. FINDINGS: Brain: No acute intracranial hemorrhage.  Gray-white differentiation is preserved. No hydrocephalus or extra-axial collection. No mass effect or midline shift. Vascular: No hyperdense vessel or unexpected calcification. Skull: No calvarial fracture or suspicious bone lesion. Skull base is unremarkable. Sinuses/Orbits: Unremarkable. Other: None. ASPECTS ([place]ta Stroke Program Early CT Score) - Ganglionic level infarction (caudate, lentiform nuclei, internal capsule, insula, M1-M3 cortex): 7 - Supraganglionic infarction (M4-M6 cortex): 3 Total score (0-10 with 10 being normal): 10 IMPRESSION: No acute intracranial hemorrhage or evidence of evolving large vessel  territory infarct. ASPECT score is 10. Code stroke imaging results were communicated on 08/13/2022 at 4:52 pm to provider Dr. Otelia Limes via secure text paging. Electronically Signed   By: Orvan Falconer M.D.   On: 08/13/2022 16:54    Anti-infectives: Anti-infectives (From admission, onward)    Start     Dose/Rate Route Frequency Ordered Stop   08/11/22 1500  vancomycin (VANCOREADY) IVPB 750 mg/150 mL  Status:  Discontinued        750 mg 150 mL/hr over 60 Minutes Intravenous Every 24 hours 08/10/22 1340 08/11/22 0518   08/11/22 0518  vancomycin variable dose per unstable renal function (pharmacist dosing)  Status:  Discontinued         Does not apply See admin instructions 08/11/22 0518 08/11/22 0850   08/10/22 2200  piperacillin-tazobactam (ZOSYN) IVPB 3.375 g        3.375 g 12.5 mL/hr over 240 Minutes Intravenous Every 8 hours 08/10/22 1334     08/10/22 1430  piperacillin-tazobactam (ZOSYN) IVPB 3.375 g        3.375 g 100 mL/hr over 30 Minutes Intravenous  Once 08/10/22 1333 08/10/22 1451   08/10/22 1430  vancomycin (VANCOCIN) IVPB 1000 mg/200 mL premix        1,000 mg 200 mL/hr over 60 Minutes Intravenous  Once 08/10/22 1338 08/10/22 1507   08/07/22 1000  cefoTEtan (CEFOTAN) 2 g in sodium chloride 0.9 % 100 mL IVPB  Status:  Discontinued        2 g 200 mL/hr over 30 Minutes  Intravenous On call to O.R. 08/07/22 0946 08/07/22 1005       Assessment/Plan: Colonic intussusception POD 8, s/p sigmoid colectomy/colostomy-LK - surgical path benign - became ill POD 3, CT as expected postop, no hemorrhage, better with volume, will continue to follow, she was slow to clear lactate but cleared today and clinically is better- I have no reason to suspect she has ongoing intraabdominal process right now that is the source of this -WBC pending today (19 yest) and LFTs remain elevated with TB up to 24.  Continue to monitor -INR down to 1.9 -ostomy is working well, CLD given yesterday but due to mentation, unable to take in much.recommend cortrak placement until patient mental status allows her to eat more. Enteral nutrition preferred over TPN and her gut is working. -mobilize as able -pulm toilet as she is able to understand -foley in place, defer to medical service regarding duration of this. -given overall persistently ill state, increasing WBC, abdominal tenderness, etc, we will repeat her CT A/P today to eval for etiology.  FEN - CLD, but not taking in much, start TNA today VTE - lovenox ID - [email or web address] (aspiration PNA)  Aspiration PNA Septic shock/SIRS - unclear etiology, CT A/P today. GERD HLD   Adam Phenix 08/15/2022

## 2022-08-15 NOTE — Progress Notes (Addendum)
Brief Nutrition Support Update  RD assessed patient yesterday, 4/30, for initiation of TPN. Patient started on TPN at 62mL/hr yesterday evening and transferred to [facility]  Confirmed with Surgery PA-C, plan to discontinue TPN today and place Cortrak to start tube feeds.  Cortrak placed and xray verified in stomach. Recs as below:   Interventions: Initiate tube feeding via Cortrak: Osmolite 1.5 at 55 ml/h (1320 ml per day) *Start at 39mL/hr and advance by 10mL Q12H to goal Provides 1980 kcal, 83 gm protein, 1006 ml free water daily  - Monitor magnesium, potassium, and phosphorus BID for at least 3 days, MD to replete as needed, as pt is at risk for refeeding syndrome given minimal/no intake x1 week and severe malnutrition. - Recommend 100mg  thiamine x5 days to aid in macronutrient metabolism.  - Free water flushes per MD. Consider: Q4H.  - Monitor weight trends.    Shelle Iron RD, LDN For contact information, refer to [facility].

## 2022-08-15 NOTE — Progress Notes (Signed)
PHARMACY - TOTAL PARENTERAL NUTRITION CONSULT NOTE   Indication:  poor enteral intake  Patient Measurements: Height: 5' (152.4 cm) Weight: 62 kg (136 lb 11 oz) IBW/kg (Calculated) : 45.5 TPN AdjBW (KG): 50.8 Body mass index is 26.69 kg/m.  Assessment: 41 yoF admitted on 4/22 with Colonic intussusception and is s/p sigmoid colectomy/colostomy on 4/23.  Suspect poor PO intake prior to admission due to long history of severe constipation for over 1 year.  On POD #7 ostomy is working well and she has been ordered CLD, but is unable to get much intake due to mentation.  Pharmacy is consulted to start TPN while waiting for CT results.  If no issues on CT, CCS has no contraindications to enteral tube feedings.  Glucose / Insulin: CBGs 137-197.  No hx of DM. received SSI 7u/24h  Electrolytes: K 3.4, Cl 113, Phos 3.2, Mag 1.8 others WNL including coCa 10.2 Renal: SCr < 1, BUN wnl Hepatic:  AST/ALT elevated but decreasing (peak 7482/3651 > now down to 167/871), Tbili increasing up to 24.3, alk phos remains elevated at 148.  TG 118  Intake / Output; MIVF: UOP 0.4 ml/kg/hr, Colostomy output 225 ml, PO intake MIVF: D5-NS-KCl 62mEq/L @ 100 ml/hr until 1800, then NS @ 65ml/hr GI Imaging: 5/1 CT A/P: multifocal areas of wall thickening along colon and stomach c/f colitis/gastritis, no obstruction or free air, increasing mild ascites and increasing anasarca GI Surgeries / Procedures:  4/23 sigmoid colectomy/colostomy  Central access: CVC triple lumen TPN start date: 4/30   Nutritional Goals: Goal TPN rate is 75 mL/hr to provide 74 g of protein and 1814 kcals per day  RD Assessment: Estimated Needs Total Energy Estimated Needs: 1800-2050 kcals Total Protein Estimated Needs: 70-85 grams Total Fluid Estimated Needs: >/= 1.8L 4/30 RD reports calories: 1700-1850, Protein 70-75 grams Fluid: >/= 1.7L   Current Nutrition:  Clear liquids   Plan:  Per discussion with general surgery, OK to  discontinue TPN at 18:00. Pt can be started on enteric feeds. Give KCl x6 runs outside of TPN Give Thiamine 100mg  per tube daily x 4 more days Continue Sensitive q6h SSI and adjust as needed  Continue MIVF per MD Pharmacy will sign-off  Thank you for allowing pharmacy to be a part of this patient's care. Please order a new pharmacy consult if TPN needs to be resumed for any reason.  Wilburn Cornelia, PharmD, BCPS Clinical Pharmacist 08/15/2022 1:17 PM   Please refer to [facility] for pharmacy phone number

## 2022-08-15 NOTE — Progress Notes (Signed)
Subjective: No seizure-like activity overnight  ROS: Unable to obtain due to poor mental status  Examination  Vital signs in last 24 hours: Temp:  [98.1 F (36.7 C)-99.1 F (37.3 C)] 98.2 F (36.8 C) (05/01 1238) Pulse Rate:  [89-112] 107 (05/01 1238) Resp:  [13-21] 19 (05/01 1238) BP: (106-146)/(54-81) 117/66 (05/01 1238) SpO2:  [91 %-100 %] 98 % (05/01 1238)  General: lying in bed, icteric  Neuro: Awake, able to tell me her name (increased from hospital, not oriented to time, able to follow simple one-step commands but not consistently, spontaneously moves upper extremities in bed with antigravity strength but did not cooperate for further strength testing  Basic Metabolic Panel: Recent Labs  Lab 08/11/22 0445 08/12/22 0622 08/13/22 0436 08/13/22 1726 08/14/22 0548 08/15/22 0709  NA 134* 135 138 140 140 144  K 3.2* 3.0* 4.2 4.3 4.2 3.4*  CL 98 89* 99 102 104 113*  CO2 21* 34* 35* 31 29 28   GLUCOSE 228* 214* 169* 135* 190* 137*  BUN 11 13 18 23  25* 21  CREATININE 1.26* 1.32* 1.08* 0.85 0.71 0.79  CALCIUM 7.8* 7.2* 7.6* 8.0* 8.0* 8.2*  MG 1.7 2.8* 2.4  --  1.9 1.8  PHOS  --   --   --   --  2.1* 3.2    CBC: Recent Labs  Lab 08/12/22 0622 08/12/22 1701 08/13/22 0436 08/13/22 1726 08/14/22 0548 08/15/22 0928  WBC 9.9  --  16.7* 17.9* 19.8* 14.7*  HGB 8.2* 8.9* 8.2* 7.5* 7.2* 4.9*  HCT 24.7* 26.4* 25.0* 22.9* 22.6* 14.4*  MCV 86.4  --  88.7 89.8 91.1 90.6  PLT 311  --  314 317 320 281     Coagulation Studies: Recent Labs    08/13/22 1726  LABPROT 21.8*  INR 1.9*    Imaging MRI brain without contrast 08/13/2022: Motion degraded study. Within limits of motion artifact, no acute intracranial process.  ASSESSMENT AND PLAN: 72 year old female status post exploratory laparotomy with sigmoid colectomy and colostomy with altered mental status.  EEG initially showed generalized periodic discharges with triphasic morphology at times reaching the frequency of 3  years.  Ativan was administered.  EEG now shows intermittent generalized periodic discharges with triphasic morphology most likely suggestive of toxic-metabolic causes.  Acute toxic-metabolic encephalopathy -EEG appears to be improving.  Recommendations -Does not need to be on any antiseizure medications -DC LTM EEG as no seizures -Continue management of multiple comorbidities per primary team -Neurology will sign off.  Please call us for any further questions -Discussed plan with Dr. Blake Divine via secure chat  I have spent a total of  36  minutes with the patient reviewing hospital notes,  test results, labs and examining the patient as well as establishing an assessment and plan.  > 50% of time was spent in direct patient care.     Lindie Spruce Epilepsy [facility] For questions after 5pm please refer to AMION to reach the Neurologist on call

## 2022-08-15 NOTE — Progress Notes (Signed)
Rapid Response Note  Asked to see patient during rounds this AM as hgb now 4.9 and patient remains with poor mentation. Patient alert to voice, A&O xself with correct name and birthday, states she is at [facility], unable to give answer to month/year. MD Akula at bedside, orders placed for T&S, 3 units PRBC. Patient placed on full monitoring at bedside, frequent vitals. MD to consult CCM.   106/60 (74) HR 107 RR 19 O2 92 RA Temp 98.3 ax  Sarah Moore  Rapid Response Nurse

## 2022-08-15 NOTE — Progress Notes (Addendum)
PT Cancellation Note  Patient Details Name: Sarah Moore MRN: [id number] DOB: April 28, 1950   Cancelled Treatment:    Reason Eval/Treat Not Completed: Other (comment) Pt on EEG and RN reporting pt with lethargy due to medication.  Lillia Pauls, PT, DPT Acute Rehabilitation Services Office [phone]    Norval Morton 08/15/2022, 10:19 AM

## 2022-08-15 NOTE — Procedures (Signed)
Cortrak  Person Inserting Tube:  Olly Shiner D, RD Tube Type:  Cortrak - 43 inches Tube Size:  10 Tube Location:  Left nare Secured by: Bridle Technique Used to Measure Tube Placement:  Marking at nare/corner of mouth Cortrak Secured At:  63 cm Procedure Comments:  Cortrak Tube Team Note:  Consult received to place a Cortrak feeding tube.   X-ray is required, abdominal x-ray has been ordered by the Cortrak team. Please confirm tube placement before using the Cortrak tube.   If the tube becomes dislodged please keep the tube and contact the Cortrak team at [email or web address] for replacement.  If after hours and replacement cannot be delayed, place a NG tube and confirm placement with an abdominal x-ray.    Madalina Rosman, RD, LDN Clinical Dietitian RD pager # available in AMION  After hours/weekend pager # available in AMION    

## 2022-08-15 NOTE — Progress Notes (Signed)
[facility]                                                                               Sarah Moore, is a 72 y.o. female, DOB - 10/02/1950, UEA:[id number] Admit date - 08/06/2022    Outpatient Primary MD for the patient is Norm Salt, [place]  LOS - 9  days    Brief summary   Sarah Moore is a 72 y.o. female with past medical history significant for hyperlipidemia, chronic constipation, history of remote DVT not on anticoagulation who presented to [facility] ED on 4/22 with complaints of abdominal pain.  Patient reports last bowel movement 1.5 weeks ago.  Also endorses loss of appetite.  Patient was seen by urgent care 2 days prior to admission and was given Linzess and Fleet enema without improvement.  Denies nausea or vomiting.  No previous history of abdominal surgery.   Patient follows with GI outpatient, Dr. Elnoria Howard.  Recent flex sigmoidoscopy 4/9 that was reported as normal and colonoscopy February 2024 with only polyps noted.  At that time patient was advised to start taking lactulose.   In the ED, temperature 98.1 F, HR 85, RR 16, BP 123/106, SpO2 98% on room air.  WBC 7.1, hemoglobin 10.9, platelets 303.  Sodium 138, potassium 2.5, chloride 100, CO2 28, glucose 96, BUN 8, creatinine 0.36.  Lipase 24.  AST 14, ALT 10, total bili 1.3.  Urinalysis unrevealing.  CT abdomen/pelvis with colocolonic intussusception at the junction of the descending and sigmoid colon likely due to a large intraluminal lipoma serving as a lead point, mild mural thickening of the into the septum without frank evidence of bowel ischemia concerning for partial functional colonic obstruction with moderate gaseous distention of the proximal colon.  EDP discussed with GI, Dr. Loreta Ave who recommended general surgery consult.  Dr. Luisa Hart was consulted who advised maintain n.p.o. status and recommended hospitalist admission.  TR[facility] consulted for admission for further evaluation management of large  bowel obstruction.   Significant Hospital events: 4/22 admit 4/23 partial colectomy and end colostomy 4/26 confused, hypotensive moved to ICU. 1.5 liters saline. Started on NE gtt. glucose 17 and treated. 4/27: Titrated off of Levophed and off of Bair hugger.  Started on bicarbonate drip. 4/28: Transferred back to hospitalist service, LFTs improved but T bili rising.  Obtaining RUQ ultrasound 4/29: Lethargic, slow to respond, AST/ALT improving, T. bili rising.  RUQ ultrasound yesterday unrevealing; general surgery to attempt clear liquid diet today 4/30: CVA workup yesterday unremarkable with negative CT head, CT angiogram head/neck and MRI brain.  But notable elevated ammonia level and started on lactulose for hepatic encephalopathy.  EEG today with positive discharges and neurology recommending transfer to [facility] for continuous EEG.  Repeating CT abdomen/pelvis today and starting on TPN; due to poor oral intake on clear liquid diet.   CT abdomen/pelvis unrevealing, transition TPN to enternal feeds on 5/1.  Discussed goals of care with family once again today who desire aggressive measures at this time. 5/1: hemoglobin drop from 7 to 4.9. GI consulted heme positive stools earlier this admission.    Assessment & Plan    Assessment and  Plan:   Circulatory shock with lactic acidosis and metabolic acidosis:  Resolved.  On 08/10/22 she was transferred to ICU for hypotension not responding to fluid resuscitation, was on pressors transiently, back to floor.  No signs of infection so far.    Large bowel obstruction secondary to intraluminal colonic mass s/p exploratory laparotomy with partial colectomy and end colostomy General surgery consulted and patient underwent exploratory laparotomy with partial colectomy and end colostomy by Dr. Alvan Dame on 08/07/2022. Pathology did not reveal any dysplasia or malignancy. Repeat CT of the abdomen and pelvis with contrast ordered today General surgery  is on board and following. Transition patient to enteral feeds starting from May 1st, via core track   Acute toxic metabolic encephalopathy Multifactorial, component of hepatic encephalopathy secondary to shock liver vs seizures Elevated ammonia levels, which are improving with lactulose. She was transferred to [facility] for evaluation of seizures,  was on LTM EEG did not show any seizure activity.  Elevated liver enzymes suspect from hypotension/circulatory shock from severe volume depletion. Initially requiring vasopressors which have been weaned off. Right upper quadrant ultrasound shows gallbladder sludge without any stones. Hyperbilirubinemia  1.3>1.3>6.0>6.0>5.7>8.4> 14.7>21.7 >24.3 Check INR/pt today,  Check DIC panel, LDH, and haptoglobin.  MRCP ordered by PCCM for further eval.    Leukocytosis improving.     Acute renal failure Resolved secondary to ATN from hypotension/circulatory shock Foley catheter placed, continue to monitor urine output.     Acute respiratory failure with hypoxia not present on  admission Continue with incentive spirometry, nasal cannula oxygen to keep sats greater than 90% Chest x-ray does not show any focal consolidation. She was started on IV zosyn for possible aspiration pneumonia, leukocytosis is improving and she remains afebrile.   Acute anemia of critical illness Heme positive stools earlier during admission. No obvious signs of bleeding.  Hemoglobin trend 10.9>9.8>9.5>8.6>7.6>8.2>8.2>7.5>7.2 > 4.9.  Currently on Protonix 40 mg IV every 12 hours 3 units of PRBC ordered repeat H&H later tonight keep hemoglobin greater than 7. GI consulted for further recommendations    Hypokalemia Replaced    Elevated troponin Probably secondary to type II demand ischemia in the setting of circulatory shock Echocardiogram on 4/27 shows left ventricular ejection fraction of 60 to 65% without any regional wall abnormalities. Continue to  monitor  Nutrition: Cor trak in place and tube feeds will be started tonight.    Hyperlipidemia  In view of multiple co morbidities, palliative care consulted for goals of care.   RN Pressure Injury Documentation:    Malnutrition Type:  Nutrition Problem: Severe Malnutrition Etiology: acute illness   Malnutrition Characteristics:  Signs/Symptoms: mild fat depletion, moderate muscle depletion, energy intake < or equal to 50% for > or equal to 5 days   Nutrition Interventions:  Interventions: TPN  Estimated body mass index is 26.69 kg/m as calculated from the following:   Height as of this encounter: 5' (1.524 m).   Weight as of this encounter: 62 kg.  Code Status: full code.  DVT Prophylaxis:  SCDs Start: 08/06/22 1950   Level of Care: Level of care: ICU Family Communication: discussed with family at bedside and daughter over the phone.   Disposition Plan:     Remains inpatient appropriate:  anemia and hyperbilirubinemia  Procedures:   Ex lap with partial colectomy and end colostomy, Dr. Sheliah Hatch 4/23 Right IJ central line placed 4/26 Consultants:   PCCM Gastroenterology  General surgery.   Antimicrobials:   Anti-infectives (From admission, onward)  Start     Dose/Rate Route Frequency Ordered Stop   08/11/22 1500  vancomycin (VANCOREADY) IVPB 750 mg/150 mL  Status:  Discontinued        750 mg 150 mL/hr over 60 Minutes Intravenous Every 24 hours 08/10/22 1340 08/11/22 0518   08/11/22 0518  vancomycin variable dose per unstable renal function (pharmacist dosing)  Status:  Discontinued         Does not apply See admin instructions 08/11/22 0518 08/11/22 0850   08/10/22 2200  piperacillin-tazobactam (ZOSYN) IVPB 3.375 g        3.375 g 12.5 mL/hr over 240 Minutes Intravenous Every 8 hours 08/10/22 1334     08/10/22 1430  piperacillin-tazobactam (ZOSYN) IVPB 3.375 g        3.375 g 100 mL/hr over 30 Minutes Intravenous  Once 08/10/22 1333 08/10/22 1451    08/10/22 1430  vancomycin (VANCOCIN) IVPB 1000 mg/200 mL premix        1,000 mg 200 mL/hr over 60 Minutes Intravenous  Once 08/10/22 1338 08/10/22 1507   08/07/22 1000  cefoTEtan (CEFOTAN) 2 g in sodium chloride 0.9 % 100 mL IVPB  Status:  Discontinued        2 g 200 mL/hr over 30 Minutes Intravenous On call to O.R. 08/07/22 0946 08/07/22 1005        Medications  Scheduled Meds:  Chlorhexidine Gluconate Cloth  6 each Topical Daily   furosemide  40 mg Intravenous Q6H   insulin aspart  0-9 Units Subcutaneous Q6H   lactulose  20 g Oral BID   pantoprazole (PROTONIX) IV  40 mg Intravenous Q12H   thiamine  100 mg Per Tube QHS   Continuous Infusions:  sodium chloride Stopped (08/10/22 2129)   sodium chloride 40 mL/hr at 08/15/22 0252   feeding supplement (OSMOLITE 1.5 CAL)     piperacillin-tazobactam (ZOSYN)  IV 3.375 g (08/15/22 0523)   potassium chloride 10 mEq (08/15/22 1215)   TPN ADULT (ION) 35 mL/hr at 08/15/22 0209   PRN Meds:.sodium chloride, hydrALAZINE, naphazoline-glycerin, ondansetron (ZOFRAN) IV, mouth rinse    Subjective:   Sarah Moore was seen and examined today.  Confused, slightly agitated.   Objective:   Vitals:   08/15/22 1200 08/15/22 1215 08/15/22 1230 08/15/22 1238  BP: 130/81 114/63 112/62 117/66  Pulse:    (!) 107  Resp: 19 18 17 19   Temp:  99.1 F (37.3 C)  98.2 F (36.8 C)  TempSrc:  Axillary  Axillary  SpO2:  91%  98%  Weight:      Height:        Intake/Output Summary (Last 24 hours) at 08/15/2022 1341 Last data filed at 08/15/2022 1000 Gross per 24 hour  Intake 912.86 ml  Output 750 ml  Net 162.86 ml   Filed Weights   08/07/22 1014 08/10/22 1107 08/14/22 0500  Weight: 52.2 kg 50.8 kg 62 kg     Exam General exam: Ill appearing, jaundiced patient, confused .  Respiratory system: diminished air entry at bases Cardiovascular system: S1 & S2 heard, RRR.  Gastrointestinal system: Abdomen is nondistended, soft and nontender.  Central  nervous system: alert, but confused, able to move all her extremities spontaneously.  Extremities: no cyanosis.  Skin: No rashes,  Psychiatry: UNABLE TO ASSESS    Data Reviewed:  I have personally reviewed following labs and imaging studies   CBC Lab Results  Component Value Date   WBC 14.7 (H) 08/15/2022   RBC 1.59 (L) 08/15/2022  HGB 4.9 (LL) 08/15/2022   HCT 14.4 (L) 08/15/2022   MCV 90.6 08/15/2022   MCH 30.8 08/15/2022   PLT 281 08/15/2022   MCHC 34.0 08/15/2022   RDW 14.9 08/15/2022   LYMPHSABS 2.6 08/06/2022   MONOABS 0.4 08/06/2022   EOSABS 0.1 08/06/2022   BASOSABS 0.0 08/06/2022     Last metabolic panel Lab Results  Component Value Date   NA 144 08/15/2022   K 3.4 (L) 08/15/2022   CL 113 (H) 08/15/2022   CO2 28 08/15/2022   BUN 21 08/15/2022   CREATININE 0.79 08/15/2022   GLUCOSE 137 (H) 08/15/2022   GFRNONAA >60 08/15/2022   GFRAA >60 12/17/2019   CALCIUM 8.2 (L) 08/15/2022   PHOS 3.2 08/15/2022   PROT 3.8 (L) 08/15/2022   ALBUMIN <1.5 (L) 08/15/2022   BILITOT 24.3 (HH) 08/15/2022   ALKPHOS 148 (H) 08/15/2022   AST 167 (H) 08/15/2022   ALT 871 (H) 08/15/2022   ANIONGAP 3 (L) 08/15/2022    CBG (last 3)  Recent Labs    08/14/22 2338 08/15/22 0521 08/15/22 1254  GLUCAP 131* 148* 133*      Coagulation Profile: Recent Labs  Lab 08/12/22 0622 08/13/22 1726  INR 3.6* 1.9*     Radiology Studies: CT ABDOMEN PELVIS W CONTRAST  Result Date: 08/15/2022 CLINICAL DATA:  Sepsis. Status post Hartmann's pouch. Leukocytosis and lactic acidosis EXAM: CT ABDOMEN AND PELVIS WITH CONTRAST TECHNIQUE: Multidetector CT imaging of the abdomen and pelvis was performed using the standard protocol following bolus administration of intravenous contrast. RADIATION DOSE REDUCTION: This exam was performed according to the departmental dose-optimization program which includes automated exposure control, adjustment of the mA and/or kV according to patient size and/or  use of iterative reconstruction technique. CONTRAST:  75mL OMNIPAQUE IOHEXOL 350 MG/ML SOLN COMPARISON:  CT 08/10/2022.  Ultrasound 08/12/2022. FINDINGS: Lower chest: Small bilateral pleural effusions are new. Adjacent parenchymal opacities. Atelectasis is favored over infiltrate but recommend follow-up. Breathing motion. Enteric tube in the stomach. Hepatobiliary: Known left hepatic lobe segment 2 hepatic hemangioma. Periportal edema. Patent portal vein. No other space-occupying liver lesion. Gallbladder is mildly distended. Pancreas: Mild atrophy of the pancreas.  No obvious pancreatic mass. Spleen: Lobular contours of the spleen with a central splenule. Adrenals/Urinary Tract: Adrenal glands are preserved. The kidneys are without enhancing mass or collecting system dilatation. Foley catheter in the urinary bladder with some dependent air. Stomach/Bowel: There is diffuse wall thickening of the stomach. Please correlate for gastritis. Again enteric tube with tip along the distal stomach. Small bowel is nondilated. Surgical changes seen. There is a left-sided colostomy. The the transverse and ascending colon has wall thickening but are nondilated. Edema is greatest towards the ostomy site. Rectosigmoid stump is nondilated. Additional mild wall thickening in the area of the rectum as well. Small bowel is nondilated diffusely. No areas of pneumatosis. Vascular/Lymphatic: Diffuse vascular calcifications. Normal caliber aorta and IVC. Significant atherosclerotic plaque along the aorta at the level of the diaphragm eccentric towards the right. Once again there is some ectasia of the extreme distal descending thoracic aorta of up to 3.4 cm as on prior. Reproductive: Status post hysterectomy. No adnexal masses. Other: Scattered ascites identified. There is some high density material along components of the ascites in the pelvis on the right side as on prior. The amount of ascites is slightly increased from previous.  Mesenteric stranding. Increasing anasarca. Midline skin staples along the pelvis. Musculoskeletal: Scattered degenerative changes of the spine and pelvis. IMPRESSION: Again  surgical changes seen from diverting colostomy along the left side. Residual rectosigmoid stump. Multifocal areas of wall thickening along the colon as well as of the stomach. Please correlate for colitis and gastritis. No obstruction or free air. Increasing mild ascites.  There is increasing anasarca. Bilateral pleural effusions and adjacent opacities. Atelectasis is favored over infiltrate. Recommend follow-up. Enteric tube.  Foley catheter. Electronically Signed   By: Karen Kays M.D.   On: 08/15/2022 12:12   Overnight EEG with video  Result Date: 08/15/2022 Charlsie Quest, MD     08/15/2022 10:53 AM Patient Name: Sarah Moore MRN: [id number] Epilepsy Attending: Charlsie Quest Referring Physician/Provider: Milon Dikes, MD Duration: 08/14/2022 1854 to 08/15/2022 4098  Patient history: 72yo F with ams getting eeg to evaluate for seizure  Level of alertness:  awake, asleep  AEDs during EEG study: None  Technical aspects: This EEG study was done with scalp electrodes positioned according to the 10-20 International system of electrode placement. Electrical activity was reviewed with band pass filter of 1-70Hz , sensitivity of 7 uV/mm, display speed of 65mm/sec with a 60Hz  notched filter applied as appropriate. EEG data were recorded continuously and digitally stored.  Video monitoring was available and reviewed as appropriate.  Description: During awake state, no clear posterior dominant rhythm was seen. Sleep was characterized by sleep spindles (12 -14 Hz), maximal frontocentral region. EEG showed continuous generalized 3-5Hz  theta- delta slowing.  Intermittent generalized periodic discharges with triphasic morphology were also noted at 1-2 Hz, predominantly when awake/stimulated.  Hyperventilation and photic stimulation were not performed.     ABNORMALITY - Periodic discharges with triphasic morphology, generalized ( GPDs) - Continuous slow, generalized  IMPRESSION: This study showed generalized periodic discharges with triphasic morphology most likely toxic-metabolic causes. Additionally there is moderate diffuse encephalopathy.  No seizures were noted. EEG appears to be improving compared to previous day.  Charlsie Quest   EEG adult  Result Date: 08/14/2022 Charlsie Quest, MD     08/14/2022 11:40 AM Patient Name: Sarah Moore MRN: [id number] Epilepsy Attending: Charlsie Quest Referring Physician/Provider: Caryl Pina, MD Date: 08/14/2022 Duration: 26.08 mins Patient history: 72yo F with ams getting eeg to evaluate for seizure Level of alertness:  lethargic AEDs during EEG study: None Technical aspects: This EEG study was done with scalp electrodes positioned according to the 10-20 International system of electrode placement. Electrical activity was reviewed with band pass filter of 1-70Hz , sensitivity of 7 uV/mm, display speed of 1mm/sec with a 60Hz  notched filter applied as appropriate. EEG data were recorded continuously and digitally stored.  Video monitoring was available and reviewed as appropriate. Description: EEG showed continuous generalized 2 to 3 Hz delta slowing.  Generalized periodic discharges with triphasic morphology were also noted at 2 to 2.5 Hz, rarely reaching 3 Hz.  Hyperventilation and photic stimulation were not performed.   ABNORMALITY - Periodic discharges with triphasic morphology, generalized ( GPDs) - Continuous slow, generalized IMPRESSION: This study showed generalized periodic discharges with triphasic morphology which is on the ictal-interictal continuum.  The morphology and frequency is typically associated with toxic Metabolic causes.  However the frequency reaches 2.5 to 3 Hz which could be interictal.  Consider Ativan challenge and long-term EEG monitoring. Additionally the study is suggestive of moderate  to severe diffuse encephalopathy.  Dr. [place] and Dr Wilford Corner were notified. Charlsie Quest   MR BRAIN WO CONTRAST  Result Date: 08/13/2022 CLINICAL DATA:  Neuro deficit, acute, stroke suspected. EXAM: MRI HEAD WITHOUT  CONTRAST TECHNIQUE: Multiplanar, multiecho pulse sequences of the brain and surrounding structures were obtained without intravenous contrast. COMPARISON:  Stroke protocol CT/CTA/CTP 08/13/2022. FINDINGS: Brain: Motion degraded study. Within limits of motion artifact, no acute infarct or hemorrhage. No hydrocephalus or extra-axial collection. No mass or midline shift. Vascular: Normal flow voids. Skull and upper cervical spine: Normal marrow signal. Sinuses/Orbits: Unremarkable. Other: None. IMPRESSION: Motion degraded study. Within limits of motion artifact, no acute intracranial process. Electronically Signed   By: Orvan Falconer M.D.   On: 08/13/2022 20:53   CT ANGIO HEAD W OR WO CONTRAST  Result Date: 08/13/2022 CLINICAL DATA:  Neuro deficit, acute, stroke suspected. Right facial droop and aphasia. Unable to move lower extremity. EXAM: CT ANGIOGRAPHY HEAD AND NECK CT PERFUSION BRAIN TECHNIQUE: Multidetector CT imaging of the head and neck was performed using the standard protocol during bolus administration of intravenous contrast. Multiplanar CT image reconstructions and MIPs were obtained to evaluate the vascular anatomy. Carotid stenosis measurements (when applicable) are obtained utilizing NASCET criteria, using the distal internal carotid diameter as the denominator. Multiphase CT imaging of the brain was performed following IV bolus contrast injection. Subsequent parametric perfusion maps were calculated using RAPID software. RADIATION DOSE REDUCTION: This exam was performed according to the departmental dose-optimization program which includes automated exposure control, adjustment of the mA and/or kV according to patient size and/or use of iterative reconstruction technique.  CONTRAST:  OMNIPAQUE IOHEXOL 350 MG/ML SOLN COMPARISON:  Same-day head CT. FINDINGS: CTA NECK FINDINGS Aortic arch: Two-vessel arch configuration with common origin of the right brachiocephalic and left common carotid arteries. Atherosclerotic calcifications of the aortic arch and arch vessel origins. Arch vessel origins are patent. Right carotid system: No evidence of dissection, stenosis (50% or greater) or occlusion. Mild calcified plaque along the right carotid bulb and proximal right cervical ICA. Left carotid system: No evidence of dissection, stenosis (50% or greater) or occlusion. Mild calcified plaque along the left carotid bulb and proximal left cervical ICA. Vertebral arteries: Left dominant.  Patent to the skull base. Skeleton: No suspicious bone lesions. Other neck: Unremarkable. Upper chest: Small bilateral pleural effusions. Review of the MIP images confirms the above findings CTA HEAD FINDINGS Anterior circulation: Calcified plaque along the carotid siphons without hemodynamically significant stenosis. The proximal ACAs and MCAs are patent without stenosis or aneurysm. Distal branches are symmetric. Posterior circulation: The right vertebral artery functionally terminates in PICA. The intracranial portion of the left vertebral artery and basilar artery are patent without stenosis or aneurysm. The SCAs, AICAs and PICAs are patent proximally. The PCAs are patent proximally without stenosis or aneurysm. Distal branches are symmetric. Venous sinuses: As permitted by contrast timing, patent. Anatomic variants: None. Review of the MIP images confirms the above findings CT Brain Perfusion Findings: ASPECTS: 10 CBF (<30%) Volume: 0mL Perfusion (Tmax>6.0s) volume: 0mL Mismatch Volume: 0mL Infarction Location: Not applicable. IMPRESSION: 1. No large vessel occlusion or hemodynamically significant stenosis in the head or neck vessels. 2. No evidence of core infarct or penumbra on CT perfusion. 3. Small  bilateral pleural effusions. 4. Aortic Atherosclerosis (ICD10-I70.0). Electronically Signed   By: Orvan Falconer M.D.   On: 08/13/2022 17:30   CT ANGIO NECK W OR WO CONTRAST  Result Date: 08/13/2022 CLINICAL DATA:  Neuro deficit, acute, stroke suspected. Right facial droop and aphasia. Unable to move lower extremity. EXAM: CT ANGIOGRAPHY HEAD AND NECK CT PERFUSION BRAIN TECHNIQUE: Multidetector CT imaging of the head and neck was performed using the standard protocol  during bolus administration of intravenous contrast. Multiplanar CT image reconstructions and MIPs were obtained to evaluate the vascular anatomy. Carotid stenosis measurements (when applicable) are obtained utilizing NASCET criteria, using the distal internal carotid diameter as the denominator. Multiphase CT imaging of the brain was performed following IV bolus contrast injection. Subsequent parametric perfusion maps were calculated using RAPID software. RADIATION DOSE REDUCTION: This exam was performed according to the departmental dose-optimization program which includes automated exposure control, adjustment of the mA and/or kV according to patient size and/or use of iterative reconstruction technique. CONTRAST:  OMNIPAQUE IOHEXOL 350 MG/ML SOLN COMPARISON:  Same-day head CT. FINDINGS: CTA NECK FINDINGS Aortic arch: Two-vessel arch configuration with common origin of the right brachiocephalic and left common carotid arteries. Atherosclerotic calcifications of the aortic arch and arch vessel origins. Arch vessel origins are patent. Right carotid system: No evidence of dissection, stenosis (50% or greater) or occlusion. Mild calcified plaque along the right carotid bulb and proximal right cervical ICA. Left carotid system: No evidence of dissection, stenosis (50% or greater) or occlusion. Mild calcified plaque along the left carotid bulb and proximal left cervical ICA. Vertebral arteries: Left dominant.  Patent to the skull base. Skeleton:  No suspicious bone lesions. Other neck: Unremarkable. Upper chest: Small bilateral pleural effusions. Review of the MIP images confirms the above findings CTA HEAD FINDINGS Anterior circulation: Calcified plaque along the carotid siphons without hemodynamically significant stenosis. The proximal ACAs and MCAs are patent without stenosis or aneurysm. Distal branches are symmetric. Posterior circulation: The right vertebral artery functionally terminates in PICA. The intracranial portion of the left vertebral artery and basilar artery are patent without stenosis or aneurysm. The SCAs, AICAs and PICAs are patent proximally. The PCAs are patent proximally without stenosis or aneurysm. Distal branches are symmetric. Venous sinuses: As permitted by contrast timing, patent. Anatomic variants: None. Review of the MIP images confirms the above findings CT Brain Perfusion Findings: ASPECTS: 10 CBF (<30%) Volume: 0mL Perfusion (Tmax>6.0s) volume: 0mL Mismatch Volume: 0mL Infarction Location: Not applicable. IMPRESSION: 1. No large vessel occlusion or hemodynamically significant stenosis in the head or neck vessels. 2. No evidence of core infarct or penumbra on CT perfusion. 3. Small bilateral pleural effusions. 4. Aortic Atherosclerosis (ICD10-I70.0). Electronically Signed   By: Orvan Falconer M.D.   On: 08/13/2022 17:30   CT CEREBRAL PERFUSION W CONTRAST  Result Date: 08/13/2022 CLINICAL DATA:  Neuro deficit, acute, stroke suspected. Right facial droop and aphasia. Unable to move lower extremity. EXAM: CT ANGIOGRAPHY HEAD AND NECK CT PERFUSION BRAIN TECHNIQUE: Multidetector CT imaging of the head and neck was performed using the standard protocol during bolus administration of intravenous contrast. Multiplanar CT image reconstructions and MIPs were obtained to evaluate the vascular anatomy. Carotid stenosis measurements (when applicable) are obtained utilizing NASCET criteria, using the distal internal carotid diameter  as the denominator. Multiphase CT imaging of the brain was performed following IV bolus contrast injection. Subsequent parametric perfusion maps were calculated using RAPID software. RADIATION DOSE REDUCTION: This exam was performed according to the departmental dose-optimization program which includes automated exposure control, adjustment of the mA and/or kV according to patient size and/or use of iterative reconstruction technique. CONTRAST:  OMNIPAQUE IOHEXOL 350 MG/ML SOLN COMPARISON:  Same-day head CT. FINDINGS: CTA NECK FINDINGS Aortic arch: Two-vessel arch configuration with common origin of the right brachiocephalic and left common carotid arteries. Atherosclerotic calcifications of the aortic arch and arch vessel origins. Arch vessel origins are patent. Right carotid system: No evidence  of dissection, stenosis (50% or greater) or occlusion. Mild calcified plaque along the right carotid bulb and proximal right cervical ICA. Left carotid system: No evidence of dissection, stenosis (50% or greater) or occlusion. Mild calcified plaque along the left carotid bulb and proximal left cervical ICA. Vertebral arteries: Left dominant.  Patent to the skull base. Skeleton: No suspicious bone lesions. Other neck: Unremarkable. Upper chest: Small bilateral pleural effusions. Review of the MIP images confirms the above findings CTA HEAD FINDINGS Anterior circulation: Calcified plaque along the carotid siphons without hemodynamically significant stenosis. The proximal ACAs and MCAs are patent without stenosis or aneurysm. Distal branches are symmetric. Posterior circulation: The right vertebral artery functionally terminates in PICA. The intracranial portion of the left vertebral artery and basilar artery are patent without stenosis or aneurysm. The SCAs, AICAs and PICAs are patent proximally. The PCAs are patent proximally without stenosis or aneurysm. Distal branches are symmetric. Venous sinuses: As permitted by  contrast timing, patent. Anatomic variants: None. Review of the MIP images confirms the above findings CT Brain Perfusion Findings: ASPECTS: 10 CBF (<30%) Volume: 0mL Perfusion (Tmax>6.0s) volume: 0mL Mismatch Volume: 0mL Infarction Location: Not applicable. IMPRESSION: 1. No large vessel occlusion or hemodynamically significant stenosis in the head or neck vessels. 2. No evidence of core infarct or penumbra on CT perfusion. 3. Small bilateral pleural effusions. 4. Aortic Atherosclerosis (ICD10-I70.0). Electronically Signed   By: Orvan Falconer M.D.   On: 08/13/2022 17:30   CT HEAD CODE STROKE WO CONTRAST  Result Date: 08/13/2022 CLINICAL DATA:  Code stroke. Neuro deficit, acute, stroke suspected. EXAM: CT HEAD WITHOUT CONTRAST TECHNIQUE: Contiguous axial images were obtained from the base of the skull through the vertex without intravenous contrast. RADIATION DOSE REDUCTION: This exam was performed according to the departmental dose-optimization program which includes automated exposure control, adjustment of the mA and/or kV according to patient size and/or use of iterative reconstruction technique. COMPARISON:  None Available. FINDINGS: Brain: No acute intracranial hemorrhage. Gray-white differentiation is preserved. No hydrocephalus or extra-axial collection. No mass effect or midline shift. Vascular: No hyperdense vessel or unexpected calcification. Skull: No calvarial fracture or suspicious bone lesion. Skull base is unremarkable. Sinuses/Orbits: Unremarkable. Other: None. ASPECTS (Alberta Stroke Program Early CT Score) - Ganglionic level infarction (caudate, lentiform nuclei, internal capsule, insula, M1-M3 cortex): 7 - Supraganglionic infarction (M4-M6 cortex): 3 Total score (0-10 with 10 being normal): 10 IMPRESSION: No acute intracranial hemorrhage or evidence of evolving large vessel territory infarct. ASPECT score is 10. Code stroke imaging results were communicated on 08/13/2022 at 4:52 pm to  provider Dr. Otelia Limes via secure text paging. Electronically Signed   By: Orvan Falconer M.D.   On: 08/13/2022 16:54       Kathlen Mody M.D. [facility] 08/15/2022, 1:41 PM  Available via Epic secure chat 7am-7pm After 7 pm, please refer to night coverage provider listed on amion.

## 2022-08-16 ENCOUNTER — Encounter (HOSPITAL_COMMUNITY): Payer: Self-pay | Admitting: Family Medicine

## 2022-08-16 ENCOUNTER — Inpatient Hospital Stay (HOSPITAL_COMMUNITY): Payer: Medicare HMO

## 2022-08-16 DIAGNOSIS — R578 Other shock: Secondary | ICD-10-CM | POA: Diagnosis not present

## 2022-08-16 DIAGNOSIS — K561 Intussusception: Secondary | ICD-10-CM | POA: Diagnosis not present

## 2022-08-16 DIAGNOSIS — Z515 Encounter for palliative care: Secondary | ICD-10-CM | POA: Diagnosis not present

## 2022-08-16 DIAGNOSIS — D62 Acute posthemorrhagic anemia: Secondary | ICD-10-CM

## 2022-08-16 DIAGNOSIS — G9341 Metabolic encephalopathy: Secondary | ICD-10-CM

## 2022-08-16 DIAGNOSIS — Z7189 Other specified counseling: Secondary | ICD-10-CM | POA: Diagnosis not present

## 2022-08-16 LAB — COMPREHENSIVE METABOLIC PANEL
ALT: 763 U/L — ABNORMAL HIGH (ref 0–44)
AST: 159 U/L — ABNORMAL HIGH (ref 15–41)
Albumin: 1.6 g/dL — ABNORMAL LOW (ref 3.5–5.0)
Alkaline Phosphatase: 187 U/L — ABNORMAL HIGH (ref 38–126)
Anion gap: 5 (ref 5–15)
BUN: 19 mg/dL (ref 8–23)
CO2: 28 mmol/L (ref 22–32)
Calcium: 8.6 mg/dL — ABNORMAL LOW (ref 8.9–10.3)
Chloride: 110 mmol/L (ref 98–111)
Creatinine, Ser: 0.81 mg/dL (ref 0.44–1.00)
GFR, Estimated: >60 mL/min (ref >60)
Glucose, Bld: 161 mg/dL — ABNORMAL HIGH (ref 70–99)
Potassium: 3.5 mmol/L (ref 3.5–5.1)
Sodium: 143 mmol/L (ref 135–145)
Total Bilirubin: 28.7 mg/dL (ref 0.3–1.2)
Total Protein: 4.7 g/dL — ABNORMAL LOW (ref 6.5–8.1)

## 2022-08-16 LAB — CBC
HCT: 28.4 % — ABNORMAL LOW (ref 36.0–46.0)
Hemoglobin: 10.2 g/dL — ABNORMAL LOW (ref 12.0–15.0)
MCH: 30.3 pg (ref 26.0–34.0)
MCHC: 35.9 g/dL (ref 30.0–36.0)
MCV: 84.3 fL (ref 80.0–100.0)
Platelets: 276 10*3/uL (ref 150–400)
RBC: 3.37 MIL/uL — ABNORMAL LOW (ref 3.87–5.11)
RDW: 15.2 % (ref 11.5–15.5)
WBC: 21.1 10*3/uL — ABNORMAL HIGH (ref 4.0–10.5)
nRBC: 0.6 % — ABNORMAL HIGH (ref 0.0–0.2)

## 2022-08-16 LAB — GLUCOSE, CAPILLARY
Glucose-Capillary: 122 mg/dL — ABNORMAL HIGH (ref 70–99)
Glucose-Capillary: 124 mg/dL — ABNORMAL HIGH (ref 70–99)
Glucose-Capillary: 145 mg/dL — ABNORMAL HIGH (ref 70–99)
Glucose-Capillary: 84 mg/dL (ref 70–99)
Glucose-Capillary: 92 mg/dL (ref 70–99)
Glucose-Capillary: 97 mg/dL (ref 70–99)

## 2022-08-16 LAB — BPAM RBC
Blood Product Expiration Date: 202405222359
Unit Type and Rh: 7300

## 2022-08-16 LAB — TYPE AND SCREEN
ABO/RH(D): B POS
Antibody Screen: NEGATIVE

## 2022-08-16 LAB — MAGNESIUM: Magnesium: 1.7 mg/dL (ref 1.7–2.4)

## 2022-08-16 LAB — PHOSPHORUS: Phosphorus: 3.9 mg/dL (ref 2.5–4.6)

## 2022-08-16 LAB — AMMONIA: Ammonia: 28 umol/L (ref 9–35)

## 2022-08-16 MED ORDER — LACTULOSE 10 GM/15ML PO SOLN
20.0000 g | Freq: Two times a day (BID) | ORAL | Status: DC
  Administered 2022-08-16: 20 g
  Filled 2022-08-16: qty 30

## 2022-08-16 MED ORDER — ORAL CARE MOUTH RINSE
15.0000 mL | OROMUCOSAL | Status: DC
  Administered 2022-08-16 – 2022-08-27 (×42): 15 mL via OROMUCOSAL

## 2022-08-16 MED ORDER — ORAL CARE MOUTH RINSE: 15.0000 mL | OROMUCOSAL | Status: DC | PRN

## 2022-08-16 MED ORDER — POTASSIUM CHLORIDE 20 MEQ PO PACK
40.0000 meq | PACK | Freq: Once | ORAL | Status: AC
  Administered 2022-08-16: 40 meq
  Filled 2022-08-16: qty 2

## 2022-08-16 MED ORDER — MAGNESIUM SULFATE 2 GM/50ML IV SOLN
2.0000 g | Freq: Once | INTRAVENOUS | Status: AC
  Administered 2022-08-16: 2 g via INTRAVENOUS
  Filled 2022-08-16: qty 50

## 2022-08-16 NOTE — Progress Notes (Addendum)
NAME:  Sarah Moore, MRN:  [id number], DOB:  [birth year], LOS: 10 ADMISSION DATE:  08/06/2022, CONSULTATION DATE:  4/26 REFERRING MD:  [place] , CHIEF COMPLAINT:  hypotension and altered mental status    BRIEF  72 year old female w/ hx as outlined below. Presented on 4/22 to ER w/ cc abd pain, constipation and decreased appetite. Had been seen in urgent care 2 days prior and treated w/ Linzess and fleet enema. CT abd/pelvis: showed colocolonic intussusception at the jxn of descending and sigmoid colon felt d/t intraluminal lipoma. Surgery was consulted. Initial recs: bowel rest, NPO w/ plan to bring to OR for bowel resxn.   Pertinent  Medical History  Chronic constipation, remote DVT (no longer on [facility]), HL  has a past medical history of Acid reflux, Arthritis, Chest pain, Fatigue, GERD (gastroesophageal reflux disease), and Hyperlipidemia.  Significant Hospital Events: Including procedures, antibiotic start and stop dates in addition to other pertinent events   4/22 admit with constipation and ABD pain  CT abd/pelvis revealed colo-colic intussusception beginning at the junction of the descending and sigmoid colon. Lead point is likely a large intraluminal lipoma, which has increased in size since prior study measuring 6.4 x 4.5 cm on today's exam. There is mild mural thickening of the intussusceptum, without frank evidence of bowel ischemia.  4/23 partial colectomy and end colostomy 4/24-4/25 nausea w/ clears 4/26 confused, hypotensive moved to ICU. 1.5 liters saline. Started on NE gtt. Gluc 17, treated. Improved Mental status some. No sig abd pain on exam rated pain at 3. Surgical site clean and unremarkable. Lactic 9  4/29 New right facial drop. Dysarthria and gaze preference. Code stroke called, Head CT negative  4/30 transferred to [facility] for ongoing neurological workup including need for long term EEG 5/1 critical care called back to assist in management given hemoglobin dropped from 7.2-4.9,  poor mentation, and ongoing hyperbilirubinemia   Interim History / Subjective:  Her main complaint is not being able to drink a [employer]  Objective   Blood pressure 127/78, pulse 87, temperature 98.8 F (37.1 C), temperature source Oral, resp. rate 14, height 5' (1.524 m), weight 62 kg, SpO2 100 %.        Intake/Output Summary (Last 24 hours) at 08/16/2022 1740 Last data filed at 08/16/2022 1600 Gross per 24 hour  Intake 1205.63 ml  Output 5075 ml  Net -3869.37 ml    Filed Weights   08/07/22 1014 08/10/22 1107 08/14/22 0500  Weight: 52.2 kg 50.8 kg 62 kg    Examination: General: ill-appearing woman lying in bed in no acute distress HEENT: [place]/AT, oral mucosa moist, core track in place Neuro: Awake, alert, slightly slurred speech and slow responses but comprehension appears intact.  Short attention span.  Globally weak. CV: S1-S2, regular rate and rhythm PULM: Breathing comfortably on nasal cannula, decreased basilar breath sounds, no accessory muscle use or conversational dyspnea. GI: Soft, nontender.  Midline incision without erythema or drainage.  Ostomy pink with liquid stool output Extremities: Edema Skin: No rashes, warm and dry  D-dimer >20 INR 1.7  WBC 21.1 H/H10.2/28.4  BUN 19 Creatinine 0.1 AST 159 ALT 762 Ammonia 28 Bilirubin 28.7   Resolved Hospital Problem list   AKI Circulatory shock  Lactic acidosis  Metabolic acidosis  Acute hypoxic respiratory failure  Elevated troponin  Assessment & Plan:  Acute hepatic and/or metabolic encephalopathy -Extensive neurologic workup done thus far including negative, head CT, MRI, and LTM.  Ammonia remains elevated at 45 P: -  Avoid potentially sedating medications - Frequent reorientation - Promote appropriate sleep daily cycle - Continue lactulose - Family at bedside helps - Mobility as able - Correct metabolic abnormalities  Acute blood loss anemia with likely hemorrhagic shock -Concern for GI process, now  off pressors and lactic has cleared as of 5/1 P: -Complete 7 days of Zosyn for possible aspiration pneumonia.  According to surgery no signs of sepsis from an abdominal process. - MRCP today - Evaluate for intraluminal causes of GI bleeding if concern arises for ongoing blood loss  Colonic intussusception now s/p sigmoid colectomy w/ colostomy 4/23 -Repeat CT ABD 5/1 multifocal colon and stomach thickening with mild ascites and anasarca  P: -Appreciate surgery's management.  Continue enteral feeding. - Continue wound care  Elevated LFTs secondary to shock liver vs obstructive pathology  Hyperbilirubinemia  -Mildly distended gallbladder on CT, sludge in gallbladder on [place] 4/28, can not rule out obstructing stone at this time  P: -MRCP - Liver ultrasound with Doppler today to evaluate for possible thrombus - Appreciate GIs management. -Lactulose - Avoid hepatic toxic meds Anasarca with global volume overload  Hypoalbuminemia   -Per I&O patient is +7L thus far for admission. Albumin <1.5 P: -Diurese cautiously - Strict I's/O - Foley  Questionable GI bleed  -Patient with large BM in ostomy on 4/28 with dark tarry appearing stool.  No further blood noted in ostomy since.  Anemia  -Significant drop from 7.2 to 4.9 as of 5/1. No signs of external or radiographic signs of bleeding. Patient also remains hemodynamically stable  P: -Monitor for signs of bleeding - If she develops recurrent shock would assess quickly for hypovolemia. - Continue to hold pharmacologic DVT prophylaxis since she still required transfusions on 5.1. - Continue high-dose PPI. -If hemoglobin remains stable tomorrow can hopefully transfer back out of the intensive care unit.  Hypokalemia  P: Supplement  Trend Bmet   Hyperglycemia -SSI PRN -Goal blood glucose 140-180  Dysphagia Continue core track, advance feeds as encephalopathy improves and she is cleared by speech  I met with 3 family members today at  bedside.  They subsequently met with palliative care.  They wish to continue aggressive care, which is reasonable since there is still some question of why she has ongoing hepatic dysfunction.  Best Practice (right click and "Reselect all SmartList Selections" daily)   Diet/type: tubefeeds DVT prophylaxis: SCD GI prophylaxis: N/A Lines: Central line Foley:  Yes, and it is still needed Code Status:  full code Last date of multidisciplinary goals of care discussion 5/2 with Palliative care     Steffanie Dunn, DO 08/16/22 5:47 PM [facility]  For contact information, see Amion. If no response to pager, please call PCCM consult pager. After hours, 7PM- 7AM, please call Elink.

## 2022-08-16 NOTE — Progress Notes (Signed)
Patient came to MRI and could not complete exam due to motion.  Patient screaming and moving during attempt to accomplish MRI.

## 2022-08-16 NOTE — Consult Note (Signed)
Palliative Medicine Inpatient Consult Note  Consulting Provider: Dr. Blake Moore  Reason for consult:   Palliative Care Consult Services Palliative Medicine Consult  Reason for Consult? goals of care. patient with shock liver and acute hepatic encephalopathy, worsening , elevated bilirubin to 24, hemoglobin drop to 4.9. she is full code. need goals of care. Moore is the POA.   08/16/2022  HPI:  Per intake H&P --> Sarah Moore is a 72 y.o. female with past medical history significant for hyperlipidemia, chronic constipation, history of remote DVT not on anticoagulation who presented to  on 4/22 with complaints of abdominal pain. CT abdomen/pelvis with colocolonic intussusception at the junction of the descending and sigmoid colon likely due to a large intraluminal lipoma serving as a lead point, mild mural thickening of the into the septum without frank evidence of bowel ischemia concerning for partial functional colonic obstruction with moderate gaseous distention - s/p surgical intervention --> sigmoid colectomy. Has had a prolonged hospitalization complicated by shock liver and delirium. Palliative care has been asked to get involved to assist in additional goals of care conversations.    Clinical Assessment/Goals of Care:  *Please note that this is a verbal dictation therefore any spelling or grammatical errors are due to the "Dragon Medical One" system interpretation.  I have reviewed medical records including EPIC notes, labs and imaging, received report from bedside RN, assessed the patient who is delirious lying in bed.    I met with patients former boyfriend, Sarah Moore, Moore, Sarah Moore, granddaughter, Sarah Moore, and patients Moore Sarah Moore, and Sarah Moore  to further discuss diagnosis prognosis, GOC, EOL wishes, disposition and options.   I introduced Palliative Medicine as specialized medical care for people living with serious illness. It focuses on providing relief from the symptoms and  stress of a serious illness. The goal is to improve quality of life for both the patient and the Moore.  Medical History Review and Understanding:  A review of Sarah Moore's hospitalization was held.  We discussed patient's past medical history significant for arthritis, fatigue, GERD, hyperlipidemia, and DVT.  Social History:  Sarah [place] is originally from [place].  She has lived in [place] for many years now.  She is not married.  She has 1 Moore, 3 Moore, and 9 grandchildren.  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  She is a woman of faith and per her Moore enjoyed being active in the community.  Functional and Nutritional State:  Sarah Moore was fully functional preceding admission per her Moore she was able to perform all B ADLs and IADLs.  Advance Directives:  A detailed discussion was had today regarding advanced directives.  Sarah Moore never made formal advanced directives though she did in the past share with her Moore that she would not want to be "attached to machines".  Code Status:  Concepts specific to code status, artifical feeding and hydration, continued IV antibiotics and rehospitalization was had.  The difference between a aggressive medical intervention path  and a palliative comfort care path for this patient at this time was had.   Encouraged patient/Moore to consider DNR/DNI status understanding evidenced based poor outcomes in similar hospitalized patient, as the cause of arrest is likely associated with advanced chronic/terminal illness rather than an easily reversible acute cardio-pulmonary event. I explained that DNR/DNI does not change the medical plan and it only comes into effect after a person has arrested (died).  It is a protective measure to  keep [place] from harming the patient in their last moments of life.  Sarah Moore's Moore was was agreeable to DNR/DNI with understanding that patient would not  receive CPR, defibrillation, ACLS medications, or intubation.   Discussion:  Patient's Moore and I reviewed the complicated hospital course that she has had.  We discussed her colonic intussusception/blockage.  We reviewed that as a result of this she has had a prolonged and complicated hospital course including altered mental status, anemia, shock liver, and AKI.  We discussed that she has had a declining course overall and the concern for further deterioration in the oncoming days.  Patient's Moore are all aligned with continuing present measures to see if she can improve.  They all agree that waiting throughout the weekend is reasonable prior to making additional decisions related to a formal decision regarding transition to comfort care.  We discussed allowing time for outcomes however if in the interim Sarah Moore should have an acute decompensation or decline transitioning her focus to comfort emphasis only.  Patient's Moore remains hopeful that she may improve mentally and physically in the oncoming days.  Discussed the importance of continued conversation with Moore and their  medical providers regarding overall plan of care and treatment options, ensuring decisions are within the context of the patients values and GOCs.  Decision Maker: Sarah Moore [phone]  [phone]   SUMMARY OF RECOMMENDATIONS   DNAR/DNI  Allow time for outcomes --> Patient's Moore would like to wait throughout the weekend to see if Sarah Moore improves  Patient's Moore agree that if Sarah Moore were to acutely decline they would not want to prolong life saving measures and would rather want nature to take its course  Ongoing palliative care support  Code Status/Advance Care Planning: DNAR/DNI   Palliative Prophylaxis:  Aspiration, Bowel Regimen, Delirium Protocol, Frequent Pain Assessment, Oral Care, Palliative Wound Care, and Turn Reposition  Additional Recommendations (Limitations, Scope,  Preferences): Continue current care  Psycho-social/Spiritual:  Desire for further Chaplaincy support: Yes patient would appreciate chaplain support and prayer Additional Recommendations: Education on acute on chronic disease process and burden   Prognosis: Limited overall in the setting of patient's complicated hospitalization  Discharge Planning: Discharge plan uncertain at this time  Vitals:   08/16/22 0400 08/16/22 0500  BP: 114/66 133/71  Pulse:    Resp: 14 16  Temp:    SpO2:      Intake/Output Summary (Last 24 hours) at 08/16/2022 5366 Last data filed at 08/16/2022 0547 Gross per 24 hour  Intake 2687 ml  Output 5650 ml  Net -2963 ml   Last Weight  Most recent update: 08/14/2022  5:50 AM    Weight  62 kg (136 lb 11 oz)            Gen: Elderly African-American female chronically ill in appearance HEENT: Core track in place, dry mucous membranes CV: Regular rate and rhythm  PULM: On room air breathing is even and nonlabored ABD: colostomy EXT: No edema Neuro: Alert to self  PPS: 20%   This conversation/these recommendations were discussed with patient primary care team, Dr. Chestine Spore  Total Time: 72 Billing based on MDM: High ______________________________________________________ [name] [facility] Palliative Medicine Team Team Cell Phone: [phone] Please utilize secure chat with additional questions, if there is no response within 30 minutes please call the above phone number  Palliative Medicine Team providers are available by phone from 7am to 7pm daily and can be reached through the team cell phone.  Should this patient  require assistance outside of these hours, please call the patient's attending physician.

## 2022-08-16 NOTE — Progress Notes (Signed)
Pt transported to MRI and back to 2M14 safely. MRI unable to be completed, after about of attempting, d/t patient being unable to remain still throughout the imaging. Will report this dayshift team.

## 2022-08-16 NOTE — Progress Notes (Signed)
[facility] ADULT ICU REPLACEMENT PROTOCOL   The patient does apply for the [facility] Adult ICU Electrolyte Replacment Protocol based on the criteria listed below:   1.Exclusion criteria: TCTS, ECMO, Dialysis, and Myasthenia Gravis patients 2. Is GFR >/= 30 ml/min? Yes.    Patient's GFR today is >60 3. Is SCr </= 2? Yes.   Patient's SCr is 0.81 mg/dL 4. Did SCr increase >/= 0.5 in 24 hours? No. 5.Pt's weight >40kg  Yes.   6. Abnormal electrolyte(s): potassium 3.5, mag 1.7  7. Electrolytes replaced per protocol 8.  Call MD STAT for K+ </= 2.5, Phos </= 1, or Mag </= 1 Physician:  protocol  Melvern Banker 08/16/2022 5:10 AM

## 2022-08-16 NOTE — Progress Notes (Addendum)
9 Days Post-Op   Subjective/Chief Complaint: Patient still lethargic, occasionally speaking but speech is confused Pain controlled. Having ostomy output that is non-bloody.    Objective: Vital signs in last 24 hours: Temp:  [97.6 F (36.4 C)-99.1 F (37.3 C)] 97.6 F (36.4 C) (05/02 0755) Pulse Rate:  [77-107] 96 (05/02 0800) Resp:  [14-21] 16 (05/02 0800) BP: (106-145)/(54-81) 125/72 (05/02 0800) SpO2:  [91 %-100 %] 98 % (05/02 0800) Last BM Date : 08/15/22  Intake/Output from previous day: 05/01 0701 - 05/02 0700 In: 2687 [I.V.:1028.9; Blood:739.9; NG/GT:310.3; IV Piggyback:607.9] Out: 5650 [Urine:5350; Stool:300] Intake/Output this shift: Total I/O In: 140.6 [I.V.:51; NG/GT:44.3; IV Piggyback:45.3] Out: 425 [Urine:275; Stool:150]  Abd: stoma pink and functional.  Pouch with good seal, wound without infection- staples intact, minimally tender- appropraite. Without guarding. Stool brown, no melena or hematochezia.  Lab Results:  Recent Labs    08/14/22 0548 08/15/22 0928 08/15/22 1355 08/15/22 1435 08/15/22 1700  WBC 19.8* 14.7*  --   --   --   HGB 7.2* 4.9*   < > 8.1* 10.2*  HCT 22.6* 14.4*   < > 22.8* 29.6*  PLT 320 281  --  273  --    < > = values in this interval not displayed.   BMET Recent Labs    08/15/22 0709 08/16/22 0321  NA 144 143  K 3.4* 3.5  CL 113* 110  CO2 28 28  GLUCOSE 137* 161*  BUN 21 19  CREATININE 0.79 0.81  CALCIUM 8.2* 8.6*   PT/INR Recent Labs    08/15/22 1355 08/15/22 1435  LABPROT 20.8* 19.8*  INR 1.8* 1.7*   ABG No results for input(s): "PHART", "HCO3" in the last 72 hours.  Invalid input(s): "PCO2", "PO2"   Studies/Results: CT ABDOMEN PELVIS W CONTRAST  Result Date: 08/15/2022 CLINICAL DATA:  Sepsis. Status post Hartmann's pouch. Leukocytosis and lactic acidosis EXAM: CT ABDOMEN AND PELVIS WITH CONTRAST TECHNIQUE: Multidetector CT imaging of the abdomen and pelvis was performed using the standard protocol following  bolus administration of intravenous contrast. RADIATION DOSE REDUCTION: This exam was performed according to the departmental dose-optimization program which includes automated exposure control, adjustment of the mA and/or kV according to patient size and/or use of iterative reconstruction technique. CONTRAST:  75mL OMNIPAQUE IOHEXOL 350 MG/ML SOLN COMPARISON:  CT 08/10/2022.  Ultrasound 08/12/2022. FINDINGS: Lower chest: Small bilateral pleural effusions are new. Adjacent parenchymal opacities. Atelectasis is favored over infiltrate but recommend follow-up. Breathing motion. Enteric tube in the stomach. Hepatobiliary: Known left hepatic lobe segment 2 hepatic hemangioma. Periportal edema. Patent portal vein. No other space-occupying liver lesion. Gallbladder is mildly distended. Pancreas: Mild atrophy of the pancreas.  No obvious pancreatic mass. Spleen: Lobular contours of the spleen with a central splenule. Adrenals/Urinary Tract: Adrenal glands are preserved. The kidneys are without enhancing mass or collecting system dilatation. Foley catheter in the urinary bladder with some dependent air. Stomach/Bowel: There is diffuse wall thickening of the stomach. Please correlate for gastritis. Again enteric tube with tip along the distal stomach. Small bowel is nondilated. Surgical changes seen. There is a left-sided colostomy. The the transverse and ascending colon has wall thickening but are nondilated. Edema is greatest towards the ostomy site. Rectosigmoid stump is nondilated. Additional mild wall thickening in the area of the rectum as well. Small bowel is nondilated diffusely. No areas of pneumatosis. Vascular/Lymphatic: Diffuse vascular calcifications. Normal caliber aorta and IVC. Significant atherosclerotic plaque along the aorta at the level of the diaphragm eccentric  towards the right. Once again there is some ectasia of the extreme distal descending thoracic aorta of up to 3.4 cm as on prior. Reproductive:  Status post hysterectomy. No adnexal masses. Other: Scattered ascites identified. There is some high density material along components of the ascites in the pelvis on the right side as on prior. The amount of ascites is slightly increased from previous. Mesenteric stranding. Increasing anasarca. Midline skin staples along the pelvis. Musculoskeletal: Scattered degenerative changes of the spine and pelvis. IMPRESSION: Again surgical changes seen from diverting colostomy along the left side. Residual rectosigmoid stump. Multifocal areas of wall thickening along the colon as well as of the stomach. Please correlate for colitis and gastritis. No obstruction or free air. Increasing mild ascites.  There is increasing anasarca. Bilateral pleural effusions and adjacent opacities. Atelectasis is favored over infiltrate. Recommend follow-up. Enteric tube.  Foley catheter. Electronically Signed   By: Karen Kays M.D.   On: 08/15/2022 12:12   Overnight EEG with video  Result Date: 08/15/2022 Charlsie Quest, MD     08/15/2022 10:53 AM Patient Name: Sarah Moore MRN: [id number] Epilepsy Attending: Charlsie Quest Referring Physician/Provider: Milon Dikes, MD Duration: 08/14/2022 1854 to 08/15/2022 7829  Patient history: 72yo F with ams getting eeg to evaluate for seizure  Level of alertness:  awake, asleep  AEDs during EEG study: None  Technical aspects: This EEG study was done with scalp electrodes positioned according to the 10-20 International system of electrode placement. Electrical activity was reviewed with band pass filter of 1-70Hz , sensitivity of 7 uV/mm, display speed of 74mm/sec with a 60Hz  notched filter applied as appropriate. EEG data were recorded continuously and digitally stored.  Video monitoring was available and reviewed as appropriate.  Description: During awake state, no clear posterior dominant rhythm was seen. Sleep was characterized by sleep spindles (12 -14 Hz), maximal frontocentral region. EEG  showed continuous generalized 3-5Hz  theta- delta slowing.  Intermittent generalized periodic discharges with triphasic morphology were also noted at 1-2 Hz, predominantly when awake/stimulated.  Hyperventilation and photic stimulation were not performed.    ABNORMALITY - Periodic discharges with triphasic morphology, generalized ( GPDs) - Continuous slow, generalized  IMPRESSION: This study showed generalized periodic discharges with triphasic morphology most likely toxic-metabolic causes. Additionally there is moderate diffuse encephalopathy.  No seizures were noted. EEG appears to be improving compared to previous day.  Charlsie Quest   EEG adult  Result Date: 08/14/2022 Charlsie Quest, MD     08/14/2022 11:40 AM Patient Name: Sarah Moore MRN: [id number] Epilepsy Attending: Charlsie Quest Referring Physician/Provider: Caryl Pina, MD Date: 08/14/2022 Duration: 26.08 mins Patient history: 72yo F with ams getting eeg to evaluate for seizure Level of alertness:  lethargic AEDs during EEG study: None Technical aspects: This EEG study was done with scalp electrodes positioned according to the 10-20 International system of electrode placement. Electrical activity was reviewed with band pass filter of 1-70Hz , sensitivity of 7 uV/mm, display speed of 55mm/sec with a 60Hz  notched filter applied as appropriate. EEG data were recorded continuously and digitally stored.  Video monitoring was available and reviewed as appropriate. Description: EEG showed continuous generalized 2 to 3 Hz delta slowing.  Generalized periodic discharges with triphasic morphology were also noted at 2 to 2.5 Hz, rarely reaching 3 Hz.  Hyperventilation and photic stimulation were not performed.   ABNORMALITY - Periodic discharges with triphasic morphology, generalized ( GPDs) - Continuous slow, generalized IMPRESSION: This study showed generalized periodic discharges  with triphasic morphology which is on the ictal-interictal continuum.   The morphology and frequency is typically associated with toxic Metabolic causes.  However the frequency reaches 2.5 to 3 Hz which could be interictal.  Consider Ativan challenge and long-term EEG monitoring. Additionally the study is suggestive of moderate to severe diffuse encephalopathy.  Dr. [place] and Dr Wilford Corner were notified. Priyanka Annabelle Harman    Anti-infectives: Anti-infectives (From admission, onward)    Start     Dose/Rate Route Frequency Ordered Stop   08/11/22 1500  vancomycin (VANCOREADY) IVPB 750 mg/150 mL  Status:  Discontinued        750 mg 150 mL/hr over 60 Minutes Intravenous Every 24 hours 08/10/22 1340 08/11/22 0518   08/11/22 0518  vancomycin variable dose per unstable renal function (pharmacist dosing)  Status:  Discontinued         Does not apply See admin instructions 08/11/22 0518 08/11/22 0850   08/10/22 2200  piperacillin-tazobactam (ZOSYN) IVPB 3.375 g        3.375 g 12.5 mL/hr over 240 Minutes Intravenous Every 8 hours 08/10/22 1334     08/10/22 1430  piperacillin-tazobactam (ZOSYN) IVPB 3.375 g        3.375 g 100 mL/hr over 30 Minutes Intravenous  Once 08/10/22 1333 08/10/22 1451   08/10/22 1430  vancomycin (VANCOCIN) IVPB 1000 mg/200 mL premix        1,000 mg 200 mL/hr over 60 Minutes Intravenous  Once 08/10/22 1338 08/10/22 1507   08/07/22 1000  cefoTEtan (CEFOTAN) 2 g in sodium chloride 0.9 % 100 mL IVPB  Status:  Discontinued        2 g 200 mL/hr over 30 Minutes Intravenous On call to O.R. 08/07/22 0946 08/07/22 1005       Assessment/Plan: Colonic intussusception POD 9, s/p sigmoid colectomy/colostomy-LK - surgical path benign - became ill POD 3, CT as expected postop, no hemorrhage, better with volume, will continue to follow, she was slow to clear lactate but cleared today and clinically is better- I have no reason to suspect she has ongoing intraabdominal process right now that is the source of this -WBC pending today 143 from 19 and LFTs remain  elevated with TB up to 28.7.  -INR down to 1.8 - hgb returned at 4.9 yesterday and CT was performed - no signs of bleeding. No signs of abscess. Received 2 u RBC and hgb is 10 today. Suspect 4.9 may have been lab error. There was gastric thickening on her CT which is unusual, no melena. Continue BID PPI.  -ostomy is working well, CLD given yesterday but due to mentation, unable to take in much. Cotrak placed 5/1 >>advance to goal as tolerated. -mobilize as able -pulm toilet as she is able to understand -foley in place, defer to medical service regarding duration of this.   FEN - CLD, but not taking in much, tube feeds via cortrak VTE - lovenox ID - zosyn (aspiration PNA)  Below per primary team --  Acute metabolic encephalopathy Shock liver  Aspiration PNA GERD HLD   Adam Phenix 08/16/2022

## 2022-08-17 ENCOUNTER — Inpatient Hospital Stay (HOSPITAL_COMMUNITY): Payer: Medicare HMO

## 2022-08-17 DIAGNOSIS — R5381 Other malaise: Secondary | ICD-10-CM | POA: Diagnosis not present

## 2022-08-17 DIAGNOSIS — R410 Disorientation, unspecified: Secondary | ICD-10-CM

## 2022-08-17 DIAGNOSIS — D62 Acute posthemorrhagic anemia: Secondary | ICD-10-CM | POA: Diagnosis not present

## 2022-08-17 DIAGNOSIS — K561 Intussusception: Secondary | ICD-10-CM | POA: Diagnosis not present

## 2022-08-17 DIAGNOSIS — G9341 Metabolic encephalopathy: Secondary | ICD-10-CM | POA: Diagnosis not present

## 2022-08-17 LAB — GLUCOSE, CAPILLARY
Glucose-Capillary: 113 mg/dL — ABNORMAL HIGH (ref 70–99)
Glucose-Capillary: 124 mg/dL — ABNORMAL HIGH (ref 70–99)
Glucose-Capillary: 129 mg/dL — ABNORMAL HIGH (ref 70–99)
Glucose-Capillary: 112 mg/dL — ABNORMAL HIGH (ref 70–99)
Glucose-Capillary: 87 mg/dL (ref 70–99)
Glucose-Capillary: 135 mg/dL — ABNORMAL HIGH (ref 70–99)

## 2022-08-17 LAB — CBC
HCT: 28.3 % — ABNORMAL LOW (ref 36.0–46.0)
Hemoglobin: 9.9 g/dL — ABNORMAL LOW (ref 12.0–15.0)
MCH: 30.7 pg (ref 26.0–34.0)
MCHC: 35 g/dL (ref 30.0–36.0)
MCV: 87.9 fL (ref 80.0–100.0)
Platelets: 262 10*3/uL (ref 150–400)
RBC: 3.22 MIL/uL — ABNORMAL LOW (ref 3.87–5.11)
RDW: 17.8 % — ABNORMAL HIGH (ref 11.5–15.5)
WBC: 17.8 10*3/uL — ABNORMAL HIGH (ref 4.0–10.5)
nRBC: 0.9 % — ABNORMAL HIGH (ref 0.0–0.2)

## 2022-08-17 LAB — COMPREHENSIVE METABOLIC PANEL
ALT: 622 U/L — ABNORMAL HIGH (ref 0–44)
AST: 152 U/L — ABNORMAL HIGH (ref 15–41)
Albumin: <1.5 g/dL — ABNORMAL LOW (ref 3.5–5.0)
Alkaline Phosphatase: 216 U/L — ABNORMAL HIGH (ref 38–126)
Anion gap: 9 (ref 5–15)
BUN: 20 mg/dL (ref 8–23)
CO2: 25 mmol/L (ref 22–32)
Calcium: 8.9 mg/dL (ref 8.9–10.3)
Chloride: 108 mmol/L (ref 98–111)
Creatinine, Ser: 0.73 mg/dL (ref 0.44–1.00)
GFR, Estimated: >60 mL/min (ref >60)
Glucose, Bld: 134 mg/dL — ABNORMAL HIGH (ref 70–99)
Potassium: 3.7 mmol/L (ref 3.5–5.1)
Sodium: 142 mmol/L (ref 135–145)
Total Bilirubin: 24.6 mg/dL (ref 0.3–1.2)
Total Protein: 4.8 g/dL — ABNORMAL LOW (ref 6.5–8.1)

## 2022-08-17 LAB — HAPTOGLOBIN: Haptoglobin: <10 mg/dL — ABNORMAL LOW (ref 42–346)

## 2022-08-17 LAB — PROTIME-INR
INR: 1.4 — ABNORMAL HIGH (ref 0.8–1.2)
Prothrombin Time: 16.9 seconds — ABNORMAL HIGH (ref 11.4–15.2)

## 2022-08-17 LAB — AMMONIA: Ammonia: 39 umol/L — ABNORMAL HIGH (ref 9–35)

## 2022-08-17 LAB — PHOSPHORUS: Phosphorus: 4.5 mg/dL (ref 2.5–4.6)

## 2022-08-17 LAB — MAGNESIUM: Magnesium: 2.1 mg/dL (ref 1.7–2.4)

## 2022-08-17 MED ORDER — GADOBUTROL 1 MMOL/ML IV SOLN
4.0000 mL | Freq: Once | INTRAVENOUS | Status: AC | PRN
  Administered 2022-08-17: 4 mL via INTRAVENOUS

## 2022-08-17 MED ORDER — DEXMEDETOMIDINE HCL IN NACL 400 MCG/100ML IV SOLN
0.0000 ug/kg/h | INTRAVENOUS | Status: DC
  Administered 2022-08-17: 0.1 ug/kg/h via INTRAVENOUS
  Filled 2022-08-17 (×2): qty 100

## 2022-08-17 MED ORDER — POTASSIUM CHLORIDE 20 MEQ PO PACK
40.0000 meq | PACK | Freq: Once | ORAL | Status: AC
  Administered 2022-08-17: 40 meq
  Filled 2022-08-17: qty 2

## 2022-08-17 MED ORDER — FUROSEMIDE 10 MG/ML IJ SOLN
40.0000 mg | Freq: Once | INTRAMUSCULAR | Status: AC
  Administered 2022-08-17: 40 mg via INTRAVENOUS
  Filled 2022-08-17: qty 4

## 2022-08-17 MED ORDER — LACTULOSE 10 GM/15ML PO SOLN
20.0000 g | Freq: Three times a day (TID) | ORAL | Status: DC
  Administered 2022-08-17 – 2022-08-28 (×34): 20 g
  Filled 2022-08-17 (×34): qty 30

## 2022-08-17 NOTE — Progress Notes (Signed)
10 Days Post-Op   Subjective/Chief Complaint: Appears somnolent but is more alert today. Denies pain.  Oriented to person, place (said "[place]"), situation (surgery for bowel blockage), reports year as 2023 Unable to remain still for MRI overnight.  Objective: Vital signs in last 24 hours: Temp:  [97.6 F (36.4 C)-98.8 F (37.1 C)] 98.7 F (37.1 C) (05/03 0800) Pulse Rate:  [77-99] 78 (05/03 0900) Resp:  [13-27] 14 (05/03 0900) BP: (114-137)/(65-84) 118/68 (05/03 0900) SpO2:  [92 %-100 %] 97 % (05/03 0900) Weight:  [44.1 kg] 44.1 kg (05/03 0341) Last BM Date : 08/17/22  Intake/Output from previous day: 05/02 0701 - 05/03 0700 In: 1023.4 [I.V.:212.7; NG/GT:634; IV Piggyback:176.7] Out: 2050 [Urine:1550; Stool:500] Intake/Output this shift: Total I/O In: 80 [NG/GT:80] Out: 325 [Urine:175; Stool:150] Gen: chronically ill appearing, jaundice Abd: stoma pink and functional.  Pouch with good seal, wound without infection- staples intact, minimally tender- appropraite. Without guarding. Stool brown, no melena or hematochezia.  Lab Results:  Recent Labs    08/15/22 0928 08/15/22 1355 08/15/22 1435 08/15/22 1700 08/16/22 0948  WBC 14.7*  --   --   --  21.1*  HGB 4.9*   < > 8.1* 10.2* 10.2*  HCT 14.4*   < > 22.8* 29.6* 28.4*  PLT 281  --  273  --  276   < > = values in this interval not displayed.   BMET Recent Labs    08/16/22 0321 08/17/22 0319  NA 143 142  K 3.5 3.7  CL 110 108  CO2 28 25  GLUCOSE 161* 134*  BUN 19 20  CREATININE 0.81 0.73  CALCIUM 8.6* 8.9   PT/INR Recent Labs    08/15/22 1355 08/15/22 1435  LABPROT 20.8* 19.8*  INR 1.8* 1.7*   ABG No results for input(s): "PHART", "HCO3" in the last 72 hours.  Invalid input(s): "PCO2", "PO2"   Studies/Results: US LIVER DOPPLER  Result Date: 08/16/2022 CLINICAL DATA:  Hyperbilirubinemia EXAM: DUPLEX ULTRASOUND OF LIVER TECHNIQUE: Color and duplex Doppler ultrasound was performed to evaluate  the hepatic in-flow and out-flow vessels. COMPARISON:  CT abdomen pelvis 08/15/2022 FINDINGS: Liver: Normal parenchymal echogenicity. Normal hepatic contour without nodularity. No focal lesion, mass or intrahepatic biliary ductal dilatation. Main Portal Vein size: 0.9 cm Portal Vein Velocities Main Prox:  43 cm/sec Main Mid: 41 cm/sec Main Dist:  38 cm/sec Right: 31 cm/sec Left: 15 cm/sec Hepatic Vein Velocities Right:  17 cm/sec Middle:  27 cm/sec Left:  12 cm/sec IVC: Present and patent with normal respiratory phasicity. Hepatic Artery Velocity:  96 cm/sec Splenic Vein Velocity:  42 cm/sec Spleen: 7.7 cm x 7.2 cm x 3.5 cm with a total volume of 100 cm^3 (411 cm^3 is upper limit normal) Portal Vein Occlusion/Thrombus: No Splenic Vein Occlusion/Thrombus: No Ascites: Trace perihepatic ascites. Varices: None Small amount of gallbladder sludge. Gallbladder otherwise normal in appearance. IMPRESSION: 1. Patent portal vein with appropriate direction of flow. 2. Trace perihepatic ascites. Electronically Signed   By: Acquanetta Belling M.D.   On: 08/16/2022 16:46   CT ABDOMEN PELVIS W CONTRAST  Result Date: 08/15/2022 CLINICAL DATA:  Sepsis. Status post Hartmann's pouch. Leukocytosis and lactic acidosis EXAM: CT ABDOMEN AND PELVIS WITH CONTRAST TECHNIQUE: Multidetector CT imaging of the abdomen and pelvis was performed using the standard protocol following bolus administration of intravenous contrast. RADIATION DOSE REDUCTION: This exam was performed according to the departmental dose-optimization program which includes automated exposure control, adjustment of the mA and/or kV according to patient  size and/or use of iterative reconstruction technique. CONTRAST:  75mL OMNIPAQUE IOHEXOL 350 MG/ML SOLN COMPARISON:  CT 08/10/2022.  Ultrasound 08/12/2022. FINDINGS: Lower chest: Small bilateral pleural effusions are new. Adjacent parenchymal opacities. Atelectasis is favored over infiltrate but recommend follow-up. Breathing  motion. Enteric tube in the stomach. Hepatobiliary: Known left hepatic lobe segment 2 hepatic hemangioma. Periportal edema. Patent portal vein. No other space-occupying liver lesion. Gallbladder is mildly distended. Pancreas: Mild atrophy of the pancreas.  No obvious pancreatic mass. Spleen: Lobular contours of the spleen with a central splenule. Adrenals/Urinary Tract: Adrenal glands are preserved. The kidneys are without enhancing mass or collecting system dilatation. Foley catheter in the urinary bladder with some dependent air. Stomach/Bowel: There is diffuse wall thickening of the stomach. Please correlate for gastritis. Again enteric tube with tip along the distal stomach. Small bowel is nondilated. Surgical changes seen. There is a left-sided colostomy. The the transverse and ascending colon has wall thickening but are nondilated. Edema is greatest towards the ostomy site. Rectosigmoid stump is nondilated. Additional mild wall thickening in the area of the rectum as well. Small bowel is nondilated diffusely. No areas of pneumatosis. Vascular/Lymphatic: Diffuse vascular calcifications. Normal caliber aorta and IVC. Significant atherosclerotic plaque along the aorta at the level of the diaphragm eccentric towards the right. Once again there is some ectasia of the extreme distal descending thoracic aorta of up to 3.4 cm as on prior. Reproductive: Status post hysterectomy. No adnexal masses. Other: Scattered ascites identified. There is some high density material along components of the ascites in the pelvis on the right side as on prior. The amount of ascites is slightly increased from previous. Mesenteric stranding. Increasing anasarca. Midline skin staples along the pelvis. Musculoskeletal: Scattered degenerative changes of the spine and pelvis. IMPRESSION: Again surgical changes seen from diverting colostomy along the left side. Residual rectosigmoid stump. Multifocal areas of wall thickening along the colon  as well as of the stomach. Please correlate for colitis and gastritis. No obstruction or free air. Increasing mild ascites.  There is increasing anasarca. Bilateral pleural effusions and adjacent opacities. Atelectasis is favored over infiltrate. Recommend follow-up. Enteric tube.  Foley catheter. Electronically Signed   By: Karen Kays M.D.   On: 08/15/2022 12:12    Anti-infectives: Anti-infectives (From admission, onward)    Start     Dose/Rate Route Frequency Ordered Stop   08/11/22 1500  vancomycin (VANCOREADY) IVPB 750 mg/150 mL  Status:  Discontinued        750 mg 150 mL/hr over 60 Minutes Intravenous Every 24 hours 08/10/22 1340 08/11/22 0518   08/11/22 0518  vancomycin variable dose per unstable renal function (pharmacist dosing)  Status:  Discontinued         Does not apply See admin instructions 08/11/22 0518 08/11/22 0850   08/10/22 2200  piperacillin-tazobactam (ZOSYN) IVPB 3.375 g        3.375 g 12.5 mL/hr over 240 Minutes Intravenous Every 8 hours 08/10/22 1334 08/16/22 2216   08/10/22 1430  piperacillin-tazobactam (ZOSYN) IVPB 3.375 g        3.375 g 100 mL/hr over 30 Minutes Intravenous  Once 08/10/22 1333 08/10/22 1451   08/10/22 1430  vancomycin (VANCOCIN) IVPB 1000 mg/200 mL premix        1,000 mg 200 mL/hr over 60 Minutes Intravenous  Once 08/10/22 1338 08/10/22 1507   08/07/22 1000  cefoTEtan (CEFOTAN) 2 g in sodium chloride 0.9 % 100 mL IVPB  Status:  Discontinued  2 g 200 mL/hr over 30 Minutes Intravenous On call to O.R. 08/07/22 0946 08/07/22 1005       Assessment/Plan: Colonic intussusception POD 10, s/p sigmoid colectomy/colostomy-LK - surgical path benign - became ill POD 3, CT as expected postop, no hemorrhage, better with volume, will continue to follow, she was slow to clear lactate but cleared today and clinically is better - CBC pending, lfts and bilirubin remain elevated (bili 24.6) - hgb returned at 4.9 on 5/1 and CT was performed - no signs of  bleeding. No signs of abscess. Received 2 u RBC and hgb is 10. Suspect 4.9 may have been lab error. There was gastric thickening on her CT which is unusual, no melena. Continue BID PPI.  - RUQ/liver U/S yesterday for LFT elevation shows no liver lesion, thrombus, or biliary dilation. Small amt gallbladder sludge without signs of cholecystitis. MRI for further evaluation of biliary system is pending. We will follow. -ostomy is working well, CLD given yesterday but due to mentation, unable to take in much.  Continue tube feeds. -mobilize as able -pulm toilet as she is able to understand -foley in place, defer to medical service regarding duration of this. - appreciate palliative consult: noted plans to see how she does over weekend. DNR/DNI.   FEN - CLD, but not taking in much, tube feeds via cortrak VTE - ok to resume lovenox or SQH from CCS standpoint if hgb remains stable - low suspicion for GIB.  ID - zosyn (aspiration PNA)  Below per primary team --  Acute metabolic encephalopathy Shock liver  Aspiration PNA GERD HLD   Adam Phenix 08/17/2022

## 2022-08-17 NOTE — Evaluation (Signed)
Physical Therapy Re-Evaluation Patient Details Name: Sarah Moore MRN: [id number] DOB: December 12, 1950 Today's Date: 08/17/2022  History of Present Illness  72 y.o. female presents to ED on 4/22 with complaints of abdominal pain. Dx of colonic intussuseption, s/p partial colectomy with end colostomy 08/07/22. Course complicated by confusion and hypotension starting 4/26, transfer to ICU, code stroke (negative), anemia, hyperbilirubinemia. PMH includes HLD, remote DVT, OA, GERD.  Clinical Impression   Pt presents with generalized weakness, impaired balance, poor activity tolerance, and max difficulty mobilizing. Pt to benefit from acute PT to address deficits. Pt requiring mod-max assist for bed mobility and transfer into standing this date, with significant LE weakness and poor standing tolerance with x1 episode of knee buckling. Pt motivated to progress back to PLOF of independence with mobility/ADLs, pt's daughter at bedside and supportive. PT to progress mobility as tolerated, and will continue to follow acutely.         Recommendations for follow up therapy are one component of a multi-disciplinary discharge planning process, led by the attending physician.  Recommendations may be updated based on patient status, additional functional criteria and insurance authorization.  Follow Up Recommendations       Assistance Recommended at Discharge Frequent or constant Supervision/Assistance  Patient can return home with the following  Two people to help with walking and/or transfers;Two people to help with bathing/dressing/bathroom    Equipment Recommendations Rolling walker (2 wheels)  Recommendations for Other Services       Functional Status Assessment Patient has had a recent decline in their functional status and demonstrates the ability to make significant improvements in function in a reasonable and predictable amount of time.     Precautions / Restrictions Precautions Precautions:  Fall Precaution Comments: colostomy, abdominal surgery, cortrak Restrictions Weight Bearing Restrictions: No      Mobility  Bed Mobility Overal bed mobility: Needs Assistance Bed Mobility: Rolling, Sidelying to Sit, Sit to Sidelying Rolling: Min assist Sidelying to sit: Mod assist     Sit to sidelying: Max assist General bed mobility comments: assist for log roll to and from EOB, increased support for trunk rise off of bed and LE lift back into bed. +2 for boost up in bed.    Transfers Overall transfer level: Needs assistance Equipment used: Rolling walker (2 wheels), 1 person hand held assist Transfers: Sit to/from Stand Sit to Stand: Max assist           General transfer comment: max assist for hip rise, hip extension into full standing, and steadying. x2 stands at EOB, pt with x1 episode of LE buckling and PT assisted pt back into sitting.    Ambulation/Gait               General Gait Details: nt - weakness, fatigue  Stairs            Wheelchair Mobility    Modified Rankin (Stroke Patients Only)       Balance Overall balance assessment: Needs assistance Sitting-balance support: No upper extremity supported, Feet supported Sitting balance-Leahy Scale: Fair     Standing balance support: During functional activity, Bilateral upper extremity supported, Reliant on assistive device for balance   Standing balance comment: reliant on either RW or PT assist to maintain standing                             Pertinent Vitals/Pain Pain Assessment Pain Assessment: Faces Faces Pain Scale: Hurts little more  Pain Location: abdomen Pain Descriptors / Indicators: Grimacing, Guarding Pain Intervention(s): Limited activity within patient's tolerance, Monitored during session, Repositioned    Home Living Family/patient expects to be discharged to:: Private residence Living Arrangements: Alone Available Help at Discharge: Family;Available  PRN/intermittently   Home Access: Level entry       Home Layout: One level Home Equipment: Cane - quad      Prior Function Prior Level of Function : Independent/Modified Independent             Mobility Comments: no falls in past 6 months ADLs Comments: independent     Hand Dominance   Dominant Hand: Right    Extremity/Trunk Assessment   Upper Extremity Assessment Upper Extremity Assessment: Defer to OT evaluation    Lower Extremity Assessment Lower Extremity Assessment: Generalized weakness (at least 3/5 BLE, functionally very weak and buckling in standing)    Cervical / Trunk Assessment Cervical / Trunk Assessment: Kyphotic  Communication   Communication: No difficulties  Cognition Arousal/Alertness: Lethargic (drowsy) Behavior During Therapy: WFL for tasks assessed/performed Overall Cognitive Status: Impaired/Different from baseline Area of Impairment: Attention, Following commands, Safety/judgement, Problem solving                   Current Attention Level: Sustained   Following Commands: Follows one step commands with increased time Safety/Judgement: Decreased awareness of safety, Decreased awareness of deficits   Problem Solving: Slow processing, Decreased initiation, Difficulty sequencing, Requires verbal cues, Requires tactile cues General Comments: pt soft spoken, at times tangential. Pt with periods of eye closing when EOB with VSS, requires cuing to stay on task.        General Comments General comments (skin integrity, edema, etc.): BP 100-110/70s sitting EOB, no reports of dizziness    Exercises     Assessment/Plan    PT Assessment Patient needs continued PT services  PT Problem List Decreased activity tolerance;Decreased mobility;Decreased balance;Decreased strength;Decreased safety awareness;Decreased knowledge of use of DME;Pain;Cardiopulmonary status limiting activity       PT Treatment Interventions DME  instruction;Patient/family education;Gait training;Therapeutic exercise;Therapeutic activities;Stair training;Balance training;Functional mobility training;Neuromuscular re-education    PT Goals (Current goals can be found in the Care Plan section)  Acute Rehab PT Goals Patient Stated Goal: get stronger, then go home PT Goal Formulation: With patient Time For Goal Achievement: 08/31/22 Potential to Achieve Goals: Good    Frequency Min 4X/week     Co-evaluation               AM-PAC PT "6 Clicks" Mobility  Outcome Measure Help needed turning from your back to your side while in a flat bed without using bedrails?: A Lot Help needed moving from lying on your back to sitting on the side of a flat bed without using bedrails?: A Lot Help needed moving to and from a bed to a chair (including a wheelchair)?: Total Help needed standing up from a chair using your arms (e.g., wheelchair or bedside chair)?: Total Help needed to walk in hospital room?: Total Help needed climbing 3-5 steps with a railing? : Total 6 Click Score: 8    End of Session   Activity Tolerance: Patient limited by fatigue;Patient limited by pain Patient left: in bed;with call bell/phone within reach;with bed alarm set;with family/visitor present Nurse Communication: Mobility status PT Visit Diagnosis: Difficulty in walking, not elsewhere classified (R26.2);Pain    Time: 1320-1350 PT Time Calculation (min) (ACUTE ONLY): 30 min   Charges:   PT Evaluation $PT Re-evaluation: 1 Re-eval  PT Treatments $Therapeutic Activity: 8-22 mins        Marye Round, PT DPT Acute Rehabilitation Services Secure Chat Preferred  Office [phone]   Truddie Coco 08/17/2022, 4:22 PM

## 2022-08-17 NOTE — Progress Notes (Signed)
Attempted to call MRI to schedule a time to come down for MRCP. Per MRI tech, no times available as of right now. RN to call back later this evening for a time.

## 2022-08-17 NOTE — Progress Notes (Signed)
Pt transported to MRI and back with no issues. VSS. Pt family at bedside and updated.

## 2022-08-17 NOTE — Progress Notes (Signed)
SLP Cancellation Note  Patient Details Name: JANALYNN BACHELLER MRN: [id number] DOB: 26-May-1950   Cancelled treatment:       Reason Eval/Treat Not Completed: Medical issues which prohibited therapy (Pt's case was discussed with RN who advised that they are allowing pt intermittent sips of liquids, but that pt is still NPO pending medical clearance.)  Julieth Tugman I. Vear Clock, MS, CCC-SLP Acute Rehabilitation Services Office number [phone]  Scheryl Marten 08/17/2022, 1:42 PM

## 2022-08-17 NOTE — Progress Notes (Signed)
[facility] ADULT ICU REPLACEMENT PROTOCOL   The patient does apply for the [facility] Adult ICU Electrolyte Replacment Protocol based on the criteria listed below:   1.Exclusion criteria: TCTS, ECMO, Dialysis, and Myasthenia Gravis patients 2. Is GFR >/= 30 ml/min? Yes.    Patient's GFR today is >60 3. Is SCr </= 2? Yes.   Patient's SCr is 0.73 mg/dL 4. Did SCr increase >/= 0.5 in 24 hours? No. 5.Pt's weight >40kg  Yes.   6. Abnormal electrolyte(s): potassium 3.2  7. Electrolytes replaced per protocol 8.  Call MD STAT for K+ </= 2.5, Phos </= 1, or Mag </= 1 Physician:  protocol  Melvern Banker 08/17/2022 4:28 AM

## 2022-08-17 NOTE — Progress Notes (Signed)
NAME:  Sarah Moore, MRN:  [id number], DOB:  04/19/1950, LOS: 11 ADMISSION DATE:  08/06/2022, CONSULTATION DATE:  4/26 REFERRING MD:  [place] , CHIEF COMPLAINT:  hypotension and altered mental status    BRIEF  72 year old female w/ hx as outlined below. Presented on 4/22 to ER w/ cc abd pain, constipation and decreased appetite. Had been seen in urgent care 2 days prior and treated w/ Linzess and fleet enema. CT abd/pelvis: showed colocolonic intussusception at the jxn of descending and sigmoid colon felt d/t intraluminal lipoma. Surgery was consulted. Initial recs: bowel rest, NPO w/ plan to bring to OR for bowel resxn.   Pertinent  Medical History  Chronic constipation, remote DVT (no longer on [facility]), HL  has a past medical history of Acid reflux, Arthritis, Chest pain, Fatigue, GERD (gastroesophageal reflux disease), and Hyperlipidemia.  Significant Hospital Events: Including procedures, antibiotic start and stop dates in addition to other pertinent events   4/22 admit with constipation and ABD pain  CT abd/pelvis revealed colo-colic intussusception beginning at the junction of the descending and sigmoid colon. Lead point is likely a large intraluminal lipoma, which has increased in size since prior study measuring 6.4 x 4.5 cm on today's exam. There is mild mural thickening of the intussusceptum, without frank evidence of bowel ischemia.  4/23 partial colectomy and end colostomy 4/24-4/25 nausea w/ clears 4/26 confused, hypotensive moved to ICU. 1.5 liters saline. Started on NE gtt. Gluc 17, treated. Improved Mental status some. No sig abd pain on exam rated pain at 3. Surgical site clean and unremarkable. Lactic 9  4/29 New right facial drop. Dysarthria and gaze preference. Code stroke called, Head CT negative  4/30 transferred to [facility] for ongoing neurological workup including need for long term EEG 5/1 critical care called back to assist in management given hemoglobin dropped from 7.2-4.9,  poor mentation, and ongoing hyperbilirubinemia   Interim History / Subjective:  Today she complains of being thirsty.  MRI not obtained due to inability to lie still during the test > claustrophobia and anxiety despite a family member traveling with her.  Objective   Blood pressure 118/68, pulse 78, temperature 98.7 F (37.1 C), temperature source Oral, resp. rate 14, height 5' (1.524 m), weight 44.1 kg, SpO2 97 %.        Intake/Output Summary (Last 24 hours) at 08/17/2022 0925 Last data filed at 08/17/2022 0800 Gross per 24 hour  Intake 962.83 ml  Output 1950 ml  Net -987.17 ml    Filed Weights   08/10/22 1107 08/14/22 0500 08/17/22 0341  Weight: 50.8 kg 62 kg 44.1 kg    Examination: General: Ill-appearing woman lying in bed no acute distress HEENT: [place]/AT, core track.  Icteric sclera. Neuro: Awake, alert, slow dysarthric speech but answering questions appropriately.  Globally weak. CV: S1-S2, regular rate and rhythm PULM: Breathing comfortably on nasal cannula, CTAB GI: Soft, nontender.  Midline incision without drainage.  Ostomy with stool output. Extremities: Foot edema Skin: Warm, dry, no rashes.  Jaundice.   BUN 20 Creatinine 0.73 AST 152 ALT 622 Ammonia 39 Bilirubin 24.6  MRCP unable to be completed.    Resolved Hospital Problem list   AKI Circulatory shock  Lactic acidosis  Metabolic acidosis  Acute hypoxic respiratory failure  Elevated troponin  Assessment & Plan:  Acute hepatic and/or metabolic encephalopathy -Extensive neurologic workup done thus far including negative, head CT, MRI, and LTM.  Ammonia remains elevated at 45 P: - Precedex for MRI,  then discontinue and avoid other potentially sedating medications. Has to remain in ICU for precedex.  - Delirium precautions - Family has been helpful at bedside frequently. - Promote appropriate day night sleep cycles. - Continue lactulose-increase to 3 times daily - Mobility as able  Acute blood loss  anemia with likely hemorrhagic shock -Concern for GI process, now off pressors and lactic has cleared as of 5/1 P: --Monitor off antibiotics - MRCP today  Colonic intussusception now s/p sigmoid colectomy w/ colostomy 4/23 -Repeat CT ABD 5/1 multifocal colon and stomach thickening with mild ascites and anasarca  P: - Continue enteral feeding until able to eat meals.  Can trial thin liquids today per SLP. - Continue wound care and ostomy care  Elevated LFTs secondary to shock liver vs obstructive pathology  Hyperbilirubinemia  -Mildly distended gallbladder on CT, sludge in gallbladder on [place] 4/28, can not rule out obstructing stone at this time  P: -MRCP still needed, will do with sedation today.  - Appreciate GI - Lactulose TID - avoid hepatotoxic meds  Anasarca with global volume overload  Hypoalbuminemia   -Per I&O patient is +7L thus far for admission. Albumin <1.5 P: -Diurese cautiously- lasix 40 again today - Continue Foley, hopefully can get out this weekend. - Strict I's/O  Possible GI bleed  -Patient with large BM in ostomy on 4/28 with dark tarry appearing stool.  No further blood noted in ostomy since.  Anemia  -Significant drop from 7.2 to 4.9 as of 5/1. No signs of external or radiographic signs of bleeding. Patient also remains hemodynamically stable  P: - Continue monitoring for signs of bleeding - CBC pending - If she develops recurrent shock would assess for signs of hypovolemia.  Continue central line to facilitate CVP monitoring. --Continue holding pharmacologic DVT prophylaxis today. - Con't high dose PPI -If Hb stable hopefully can transfer out later today.   Hypokalemia , resolved -Additional K+ repletion given this morning  Hyperglycemia -SSI PRN- min requirements  -goal BG 140-180  Dysphagia -con't cortrak feedings -ok for thin liquids with supervision per SLP  Deconditioning -needs aggressive PT, OT, SLP -CIR consult placed for when she is  medically ready for discharge; not there yet  Best Practice (right click and "Reselect all SmartList Selections" daily)   Diet/type: tubefeeds DVT prophylaxis: SCD GI prophylaxis: N/A Lines: Central line Foley:  Yes, and it is still needed Code Status:  full code Last date of multidisciplinary goals of care discussion 5/2 with Palliative care - con't aggressive care   This patient is critically ill with multiple organ system failure which requires frequent high complexity decision making, assessment, support, evaluation, and titration of therapies. This was completed through the application of advanced monitoring technologies and extensive interpretation of multiple databases. During this encounter critical care time was devoted to patient care services described in this note for 38 minutes.   Steffanie Dunn, DO 08/17/22 10:31 AM [facility]  For contact information, see Amion. If no response to pager, please call PCCM consult pager. After hours, 7PM- 7AM, please call Elink.

## 2022-08-18 DIAGNOSIS — Z7189 Other specified counseling: Secondary | ICD-10-CM | POA: Diagnosis not present

## 2022-08-18 DIAGNOSIS — R601 Generalized edema: Secondary | ICD-10-CM

## 2022-08-18 DIAGNOSIS — K561 Intussusception: Secondary | ICD-10-CM | POA: Diagnosis not present

## 2022-08-18 DIAGNOSIS — G9341 Metabolic encephalopathy: Secondary | ICD-10-CM | POA: Diagnosis not present

## 2022-08-18 DIAGNOSIS — Z515 Encounter for palliative care: Secondary | ICD-10-CM | POA: Diagnosis not present

## 2022-08-18 LAB — CBC
HCT: 27.6 % — ABNORMAL LOW (ref 36.0–46.0)
Hemoglobin: 9.5 g/dL — ABNORMAL LOW (ref 12.0–15.0)
MCH: 30.5 pg (ref 26.0–34.0)
MCHC: 34.4 g/dL (ref 30.0–36.0)
MCV: 88.7 fL (ref 80.0–100.0)
Platelets: 213 10*3/uL (ref 150–400)
RBC: 3.11 MIL/uL — ABNORMAL LOW (ref 3.87–5.11)
RDW: 18.5 % — ABNORMAL HIGH (ref 11.5–15.5)
WBC: 14.1 10*3/uL — ABNORMAL HIGH (ref 4.0–10.5)
nRBC: 1.1 % — ABNORMAL HIGH (ref 0.0–0.2)

## 2022-08-18 LAB — COMPREHENSIVE METABOLIC PANEL
ALT: 496 U/L — ABNORMAL HIGH (ref 0–44)
AST: 135 U/L — ABNORMAL HIGH (ref 15–41)
Albumin: <1.5 g/dL — ABNORMAL LOW (ref 3.5–5.0)
Alkaline Phosphatase: 227 U/L — ABNORMAL HIGH (ref 38–126)
Anion gap: 5 (ref 5–15)
BUN: 17 mg/dL (ref 8–23)
CO2: 26 mmol/L (ref 22–32)
Calcium: 8.6 mg/dL — ABNORMAL LOW (ref 8.9–10.3)
Chloride: 111 mmol/L (ref 98–111)
Creatinine, Ser: 0.65 mg/dL (ref 0.44–1.00)
GFR, Estimated: >60 mL/min (ref >60)
Glucose, Bld: 145 mg/dL — ABNORMAL HIGH (ref 70–99)
Potassium: 3.3 mmol/L — ABNORMAL LOW (ref 3.5–5.1)
Sodium: 142 mmol/L (ref 135–145)
Total Bilirubin: 20.1 mg/dL (ref 0.3–1.2)
Total Protein: 5 g/dL — ABNORMAL LOW (ref 6.5–8.1)

## 2022-08-18 LAB — PROTIME-INR
INR: 1.4 — ABNORMAL HIGH (ref 0.8–1.2)
Prothrombin Time: 17.2 seconds — ABNORMAL HIGH (ref 11.4–15.2)

## 2022-08-18 LAB — MAGNESIUM: Magnesium: 2 mg/dL (ref 1.7–2.4)

## 2022-08-18 LAB — GLUCOSE, CAPILLARY
Glucose-Capillary: 134 mg/dL — ABNORMAL HIGH (ref 70–99)
Glucose-Capillary: 142 mg/dL — ABNORMAL HIGH (ref 70–99)
Glucose-Capillary: 134 mg/dL — ABNORMAL HIGH (ref 70–99)
Glucose-Capillary: 137 mg/dL — ABNORMAL HIGH (ref 70–99)
Glucose-Capillary: 146 mg/dL — ABNORMAL HIGH (ref 70–99)

## 2022-08-18 LAB — AMMONIA: Ammonia: 33 umol/L (ref 9–35)

## 2022-08-18 MED ORDER — FUROSEMIDE 10 MG/ML IJ SOLN
40.0000 mg | Freq: Three times a day (TID) | INTRAMUSCULAR | Status: AC
  Administered 2022-08-18 (×3): 40 mg via INTRAVENOUS
  Filled 2022-08-18 (×3): qty 4

## 2022-08-18 MED ORDER — POTASSIUM CHLORIDE 20 MEQ PO PACK
40.0000 meq | PACK | ORAL | Status: AC
  Administered 2022-08-18 (×2): 40 meq
  Filled 2022-08-18 (×2): qty 2

## 2022-08-18 MED ORDER — MAGNESIUM SULFATE 2 GM/50ML IV SOLN
2.0000 g | Freq: Once | INTRAVENOUS | Status: AC
  Administered 2022-08-18: 2 g via INTRAVENOUS
  Filled 2022-08-18: qty 50

## 2022-08-18 MED ORDER — POTASSIUM CHLORIDE 20 MEQ PO PACK
20.0000 meq | PACK | ORAL | Status: AC
  Administered 2022-08-18 (×2): 20 meq
  Filled 2022-08-18 (×2): qty 1

## 2022-08-18 MED ORDER — POTASSIUM CHLORIDE 10 MEQ/50ML IV SOLN
10.0000 meq | INTRAVENOUS | Status: AC
  Administered 2022-08-18 (×4): 10 meq via INTRAVENOUS
  Filled 2022-08-18 (×4): qty 50

## 2022-08-18 MED ORDER — HEPARIN SODIUM (PORCINE) 5000 UNIT/ML IJ SOLN
5000.0000 [IU] | Freq: Three times a day (TID) | INTRAMUSCULAR | Status: DC
  Administered 2022-08-18 – 2022-08-28 (×31): 5000 [IU] via SUBCUTANEOUS
  Filled 2022-08-18 (×31): qty 1

## 2022-08-18 MED ORDER — ALBUMIN HUMAN 5 % IV SOLN
12.5000 g | Freq: Three times a day (TID) | INTRAVENOUS | Status: AC
  Administered 2022-08-18 (×3): 12.5 g via INTRAVENOUS
  Filled 2022-08-18 (×3): qty 250

## 2022-08-18 NOTE — Progress Notes (Signed)
Inpatient Rehab Admissions:  Inpatient Rehab Consult received.  I met with patient and daughter [name] at the bedside for rehabilitation assessment and to discuss goals and expectations of an inpatient rehab admission.  Discussed average length of stay, insurance authorization requirement, discharge home after completion of CIR. Both acknowledged understanding. Both interested in pt pursuing CIR. [name] confirmed that family will be able to provide 24/7 support after discharge.   Will continue to follow.  Signed: Wolfgang Phoenix, MS, CCC-SLP Admissions Coordinator [phone]

## 2022-08-18 NOTE — Progress Notes (Signed)
Patient received on [facility] vitals taken and patient in bed.

## 2022-08-18 NOTE — Progress Notes (Signed)
11 Days Post-Op   Subjective/Chief Complaint: Awake, answering questions. Oriented to person, hospital, situation. Confused about day/month/year. Denies abd pain. Tolerating TF.  Objective: Vital signs in last 24 hours: Temp:  [98.2 F (36.8 C)-98.7 F (37.1 C)] 98.4 F (36.9 C) (05/04 0809) Pulse Rate:  [73-95] 95 (05/04 0830) Resp:  [12-20] 17 (05/04 0830) BP: (90-125)/(56-76) 102/60 (05/04 0830) SpO2:  [91 %-100 %] 95 % (05/04 0830) Last BM Date : 08/18/22  Intake/Output from previous day: 05/03 0701 - 05/04 0700 In: 1228.3 [I.V.:89.4; NG/GT:1115.5; IV Piggyback:23.5] Out: 1925 [Urine:1325; Stool:600] Intake/Output this shift: Total I/O In: 201.7 [I.V.:0.9; NG/GT:110; IV Piggyback:90.8] Out: 350 [Urine:100; Stool:250] Gen: chronically ill appearing, jaundice Abd: stoma pink and functional.  Pouch with good seal, wound without infection- staples intact, minimally tender- appropraite. Without guarding. Stool brown, no melena or hematochezia. No RUQ pain, negative murphy's.  Lab Results:  Recent Labs    08/17/22 1005 08/18/22 0314  WBC 17.8* 14.1*  HGB 9.9* 9.5*  HCT 28.3* 27.6*  PLT 262 213   BMET Recent Labs    08/17/22 0319 08/18/22 0314  NA 142 142  K 3.7 3.3*  CL 108 111  CO2 25 26  GLUCOSE 134* 145*  BUN 20 17  CREATININE 0.73 0.65  CALCIUM 8.9 8.6*   PT/INR Recent Labs    08/17/22 1005 08/18/22 0314  LABPROT 16.9* 17.2*  INR 1.4* 1.4*   ABG No results for input(s): "PHART", "HCO3" in the last 72 hours.  Invalid input(s): "PCO2", "PO2"   Studies/Results: MR ABDOMEN MRCP W WO CONTAST  Result Date: 08/17/2022 CLINICAL DATA:  Jaundice EXAM: MRI ABDOMEN WITHOUT AND WITH CONTRAST (INCLUDING MRCP) TECHNIQUE: Multiplanar multisequence MR imaging of the abdomen was performed both before and after the administration of intravenous contrast. Heavily T2-weighted images of the biliary and pancreatic ducts were obtained, and three-dimensional MRCP images  were rendered by post processing. CONTRAST:  4mL GADAVIST GADOBUTROL 1 MMOL/ML IV SOLN COMPARISON:  CT abdomen and pelvis dated Aug 15, 2022 FINDINGS: Lower chest: Small bilateral pleural effusions. Hepatobiliary: T2 hyperintense lesion of the left hepatic lobe measuring 3.0 x 2.4 cm on series 4, image 26 with peripheral discontinuous enhancement, consistent with benign hemangioma. No suspicious focal liver lesion. Portal vein is patent. Gallbladder wall thickening and sludge. No biliary ductal dilation. Pancreas: No mass, inflammatory changes, or other parenchymal abnormality identified. Spleen:  Within normal limits in size and appearance. Adrenals/Urinary Tract: Bilateral adrenal glands are unremarkable. No hydronephrosis. No suspicious renal lesions. Stomach/Bowel: Left lower quadrant colostomy. Otherwise unremarkable. Vascular/Lymphatic: Suprarenal abdominal aortic aneurysm mural thrombus measuring up to 3.4 cm, unchanged when compared with the prior. No enlarged lymph nodes seen in the abdomen. Other: Small volume abdominal ascites. Diffuse soft tissue anasarca. Musculoskeletal: No suspicious bone lesions identified. IMPRESSION: Somewhat limited exam due to motion artifact. 1. Gallbladder wall thickening and sludge. Wall thickening is possibly reactive, although acute cholecystitis could also have this appearance. If there is clinical concern, HIDA scan could be performed further evaluation. 2. No biliary ductal dilation. No evidence of choledocholithiasis, although motion artifact limits evaluation. 3. Small bilateral pleural effusions, small volume abdominal ascites, and diffuse soft tissue anasarca. 4. Suprarenal abdominal aortic aneurysm measuring up to 3.4 cm, unchanged when compared with the prior. Recommend follow-up ultrasound every 3 years. This recommendation follows ACR consensus guidelines: White Paper of the ACR Incidental Findings Committee II on Vascular Findings. J Am Coll Radiol 2013;  10:789-794. Electronically Signed   By: Allegra Lai  M.D.   On: 08/17/2022 20:41   US LIVER DOPPLER  Result Date: 08/16/2022 CLINICAL DATA:  Hyperbilirubinemia EXAM: DUPLEX ULTRASOUND OF LIVER TECHNIQUE: Color and duplex Doppler ultrasound was performed to evaluate the hepatic in-flow and out-flow vessels. COMPARISON:  CT abdomen pelvis 08/15/2022 FINDINGS: Liver: Normal parenchymal echogenicity. Normal hepatic contour without nodularity. No focal lesion, mass or intrahepatic biliary ductal dilatation. Main Portal Vein size: 0.9 cm Portal Vein Velocities Main Prox:  43 cm/sec Main Mid: 41 cm/sec Main Dist:  38 cm/sec Right: 31 cm/sec Left: 15 cm/sec Hepatic Vein Velocities Right:  17 cm/sec Middle:  27 cm/sec Left:  12 cm/sec IVC: Present and patent with normal respiratory phasicity. Hepatic Artery Velocity:  96 cm/sec Splenic Vein Velocity:  42 cm/sec Spleen: 7.7 cm x 7.2 cm x 3.5 cm with a total volume of 100 cm^3 (411 cm^3 is upper limit normal) Portal Vein Occlusion/Thrombus: No Splenic Vein Occlusion/Thrombus: No Ascites: Trace perihepatic ascites. Varices: None Small amount of gallbladder sludge. Gallbladder otherwise normal in appearance. IMPRESSION: 1. Patent portal vein with appropriate direction of flow. 2. Trace perihepatic ascites. Electronically Signed   By: Acquanetta Belling M.D.   On: 08/16/2022 16:46    Anti-infectives: Anti-infectives (From admission, onward)    Start     Dose/Rate Route Frequency Ordered Stop   08/11/22 1500  vancomycin (VANCOREADY) IVPB 750 mg/150 mL  Status:  Discontinued        750 mg 150 mL/hr over 60 Minutes Intravenous Every 24 hours 08/10/22 1340 08/11/22 0518   08/11/22 0518  vancomycin variable dose per unstable renal function (pharmacist dosing)  Status:  Discontinued         Does not apply See admin instructions 08/11/22 0518 08/11/22 0850   08/10/22 2200  piperacillin-tazobactam (ZOSYN) IVPB 3.375 g        3.375 g 12.5 mL/hr over 240 Minutes Intravenous  Every 8 hours 08/10/22 1334 08/16/22 2216   08/10/22 1430  piperacillin-tazobactam (ZOSYN) IVPB 3.375 g        3.375 g 100 mL/hr over 30 Minutes Intravenous  Once 08/10/22 1333 08/10/22 1451   08/10/22 1430  vancomycin (VANCOCIN) IVPB 1000 mg/200 mL premix        1,000 mg 200 mL/hr over 60 Minutes Intravenous  Once 08/10/22 1338 08/10/22 1507   08/07/22 1000  cefoTEtan (CEFOTAN) 2 g in sodium chloride 0.9 % 100 mL IVPB  Status:  Discontinued        2 g 200 mL/hr over 30 Minutes Intravenous On call to O.R. 08/07/22 0946 08/07/22 1005       Assessment/Plan: Colonic intussusception POD 10, s/p sigmoid colectomy/colostomy-LK - surgical path benign - became ill POD 3, CT as expected postop, no hemorrhage, better with volume, will continue to follow, she was slow to clear lactate but cleared today and clinically is better - leukocytosis improving, LFTs slowly improving. - hgb returned at 4.9 on 5/1 and CT was performed - no signs of bleeding. No signs of abscess. Received 3 u RBC and hgb is 10. Suspect 4.9 may have been lab error. There was gastric thickening on her CT which is unusual, no melena. Continue BID PPI.  - RUQ/liver U/S 5/2 for LFT elevation shows no liver lesion, thrombus, or biliary dilation. Small amt gallbladder sludge without signs of cholecystitis. MRCP without biliary obstruction, questions cholecystitis. Low clinical suspicion for this. No role for HIDA.  -ostomy is working well, CLD given yesterday but due to mentation, unable to take in much.  Continue tube feeds. -mobilize as able -pulm toilet as she is able to understand -foley in place, defer to medical service regarding duration of this. - appreciate palliative consult: noted plans to see how she does over weekend. DNR/DNI.  - ok to transfer out of unit from CCS standpoint   FEN - CLD, but not taking in much, tube feeds via cortrak VTE - ok to resume lovenox or SQH from CCS standpoint if hgb remains stable - low  suspicion for GIB.  ID - zosyn (aspiration PNA)  Below per primary team --  Acute metabolic encephalopathy Shock liver  Aspiration PNA GERD HLD   Adam Phenix 08/18/2022

## 2022-08-18 NOTE — Progress Notes (Signed)
NAME:  Sarah Moore, MRN:  [id number], DOB:  21-Mar-1951, LOS: 12 ADMISSION DATE:  08/06/2022, CONSULTATION DATE:  4/26 REFERRING MD:  [place] , CHIEF COMPLAINT:  hypotension and altered mental status    BRIEF  72 year old female w/ hx as outlined below. Presented on 4/22 to ER w/ cc abd pain, constipation and decreased appetite. Had been seen in urgent care 2 days prior and treated w/ Linzess and fleet enema. CT abd/pelvis: showed colocolonic intussusception at the jxn of descending and sigmoid colon felt d/t intraluminal lipoma. Surgery was consulted. Initial recs: bowel rest, NPO w/ plan to bring to OR for bowel resxn.   Pertinent  Medical History  Chronic constipation, remote DVT (no longer on [facility]), HL  has a past medical history of Acid reflux, Arthritis, Chest pain, Fatigue, GERD (gastroesophageal reflux disease), and Hyperlipidemia.  Significant Hospital Events: Including procedures, antibiotic start and stop dates in addition to other pertinent events   4/22 admit with constipation and ABD pain  CT abd/pelvis revealed colo-colic intussusception beginning at the junction of the descending and sigmoid colon. Lead point is likely a large intraluminal lipoma, which has increased in size since prior study measuring 6.4 x 4.5 cm on today's exam. There is mild mural thickening of the intussusceptum, without frank evidence of bowel ischemia.  4/23 partial colectomy and end colostomy 4/24-4/25 nausea w/ clears 4/26 confused, hypotensive moved to ICU. 1.5 liters saline. Started on NE gtt. Gluc 17, treated. Improved Mental status some. No sig abd pain on exam rated pain at 3. Surgical site clean and unremarkable. Lactic 9  4/29 New right facial drop. Dysarthria and gaze preference. Code stroke called, Head CT negative  4/30 transferred to [facility] for ongoing neurological workup including need for long term EEG 5/1 critical care called back to assist in management given hemoglobin dropped from 7.2-4.9,  poor mentation, and ongoing hyperbilirubinemia   Interim History / Subjective:  This morning she denies complaints.  She has done well with thin liquids per her nurse.  MRCP was able to be completed.  Objective   Blood pressure 102/61, pulse 83, temperature 98.4 F (36.9 C), temperature source Axillary, resp. rate 15, height 5' (1.524 m), weight 44.1 kg, SpO2 96 %.        Intake/Output Summary (Last 24 hours) at 08/18/2022 0805 Last data filed at 08/18/2022 0600 Gross per 24 hour  Intake 1148.34 ml  Output 1600 ml  Net -451.66 ml    Filed Weights   08/10/22 1107 08/14/22 0500 08/17/22 0341  Weight: 50.8 kg 62 kg 44.1 kg    Examination: General: Ill-appearing woman sitting up in bed no acute distress HEENT: [place]-AT, core track.  Icteric sclera. Neuro: Arouses to verbal stimulation.  Answers questions appropriately but slowly.  Able to lift her arms against gravity.  Globally weak.  Oriented to situation and place. CV: S1-S2, regular rate and rhythm PULM: Currently on nasal cannula, CTAB. GI: Soft, nontender, nondistended.  Midline incision healing well, no erythema or drainage.  Ostomy with stool output. Extremities: Dependent edema Skin: Jaundiced, no diffuse rashes  BUN 17 Creatinine 0.65 AST 135 ALT 496 Ammonia 33 Bilirubin 20.1  MRCP poss cholecystitis, GB sludge, no choledocholithiasis or biliary ductal dilation.  Small bilateral pleural effusions and ascites, diffuse anasarca.  Suprarenal AAA 3.4 cm   Resolved Hospital Problem list   AKI Circulatory shock  Lactic acidosis  Metabolic acidosis  Acute hypoxic respiratory failure  Elevated troponin  Assessment & Plan:  Acute hepatic and metabolic encephalopathy -Extensive neurologic workup done thus far including negative, head CT, MRI, and LTM.  Ammonia remains elevated at 45 P: -Continue delirium precautions, and bed mobility, holding sedating meds.  Family bedside frequently has helped. - Promote appropriate day  night cycles. - Continue lactulose 3 times daily  Acute blood loss anemia, anemia of critical illness-Concern for GI process, now off pressors and lactic has cleared as of 5/1 P: -Monitor off antibiotics - Transfuse for hemoglobin less than 7 or hemodynamically significant bleeding  Colonic intussusception now s/p sigmoid colectomy w/ colostomy 4/23 -Repeat CT ABD 5/1 multifocal colon and stomach thickening with mild ascites and anasarca  P: - Appreciate surgery's management.  Continue thin liquids per SLP.  Nurse reports that this is gone well. - Continue wound and ostomy care.  Elevated LFTs secondary to shock liver vs obstructive pathology  Hyperbilirubinemia  -Mildly distended gallbladder on CT, sludge in gallbladder on [place] 4/28, can not rule out obstructing stone at this time  P: - Appreciate GIs management. - Diuresis - Lactulose 3 times daily - Avoid phytotoxin meds.  Anasarca with global volume overload  Hypoalbuminemia   -Per I&O patient is +7L thus far for admission. Albumin <1.5 P: - Diuresis with albumin - Monitor electrolytes - Strict I's/O - Hopefully can DC Foley tomorrow after aggressive diuresis today.  Possible GI bleed  -Patient with large BM in ostomy on 4/28 with dark tarry appearing stool.  No further blood noted in ostomy since.  Anemia  -Significant drop from 7.2 to 4.9 as of 5/1. No signs of external or radiographic signs of bleeding. Patient also remains hemodynamically stable  P: -Monitor for bleeding - Trial resuming chemical DVT prophylaxis - Continue high-dose PPI  Hypokalemia  - Aggressive repletion, continue to monitor  Hyperglycemia -SSI as needed - Goal blood glucose 100 4180  Dysphagia -Advance diet per SLP, continue core track feedings.  Doing well with thin liquids per RN  Deconditioning -Aggressive PT, OT, SLP - Out of bed to chair today. -CIR consult placed for when she is medically ready for discharge; not there yet  AAA,  suprarenal- incidental finding on MRI -needs follow up US in 3 years   Stable to transfer out of ICU today. TRH to reassume care tomorrow.   Best Practice (right click and "Reselect all SmartList Selections" daily)   Diet/type: tubefeeds DVT prophylaxis: systemic heparin GI prophylaxis: N/A Lines: Central line Foley:  Yes, and it is still needed Code Status:  full code Last date of multidisciplinary goals of care discussion 5/2 with Palliative care - con't aggressive care    Steffanie Dunn, DO 08/18/22 8:16 AM [facility]  For contact information, see Amion. If no response to pager, please call PCCM consult pager. After hours, 7PM- 7AM, please call Elink.

## 2022-08-18 NOTE — Progress Notes (Signed)
Pt transported to 6N14 VSS no issues. Daughter with patient and has all belongings.

## 2022-08-18 NOTE — Progress Notes (Signed)
Speech Language Pathology Treatment: Dysphagia  Patient Details Name: Sarah Moore MRN: [id number] DOB: 1950/10/06 Today's Date: 08/18/2022 Time: 1050-1110 SLP Time Calculation (min) (ACUTE ONLY): 20 min  Assessment / Plan / Recommendation    Clinical Impression  Patient seen by SLP for skilled treatment focused on dysphagia goals. Daughter was present in room and patient herself was in recliner, finishing discussion with palliative care. Prior to entering room, SLP confirmed with surgery PA that trying some solid PO's was approved. Patient was awake and alert but did appear fatigued/lethargic. Voice was clear but intensity was reduced overall and at times, could not be heard. SLP assessed her PO toleration of thin liquids (water), puree solids (applesauce), dysphagia 2 solids (canned, diced peaches) and dysphagia 3 solids (graham cracker). She exhibited a mildly delayed swallow initiation with liquids but no overt s/s aspiration or penetration. Mastication with dys 2 and 3 solids was prolonged but patient did fully masticate all solids. She did appear to be getting tired after finishing a piece of graham cracker. SLP then discussed diet consistency options with patient and daughter. Plan is to initiate Dys 2 (minced) solids, thin liquids diet.    HPI HPI: Patient is a 72 y.o. female with PMH: remote DVT not on anticoagulation, HLD, chronic constipation, GERD. She presented to the hospital on 08/06/22 with c/o abdominal pain and constipation. In ED, CT abd/pelvis revealed colo-colic intussusception beginning at the junction of the descending and sigmoid colon. She undewent exploratory laparotomy, sigmoid colectomy with colostomy on 08/07/22. SLP swallow evaluation ordered with surgery recommending to not advance diet beyond clears.      SLP Plan  Continue with current plan of care      Recommendations for follow up therapy are one component of a multi-disciplinary discharge planning process, led by  the attending physician.  Recommendations may be updated based on patient status, additional functional criteria and insurance authorization.    Recommendations  Diet recommendations: Dysphagia 2 (fine chop);Thin liquid Liquids provided via: Cup;Straw Medication Administration: Whole meds with puree Supervision: Full supervision/cueing for compensatory strategies;Trained caregiver to feed patient;Staff to assist with self feeding Compensations: Slow rate;Small sips/bites Postural Changes and/or Swallow Maneuvers: Seated upright 90 degrees;Upright 30-60 min after meal                  Oral care BID;Staff/trained caregiver to provide oral care   Frequent or constant Supervision/Assistance Dysphagia, unspecified (R13.10)     Continue with current plan of care    Angela Nevin, MA, CCC-SLP Speech Therapy

## 2022-08-18 NOTE — Progress Notes (Signed)
Palliative Medicine Inpatient Follow Up Note   HPI: Sarah Moore is a 72 y.o. female with past medical history significant for hyperlipidemia, chronic constipation, history of remote DVT not on anticoagulation who presented to  on 4/22 with complaints of abdominal pain. CT abdomen/pelvis with colocolonic intussusception at the junction of the descending and sigmoid colon likely due to a large intraluminal lipoma serving as a lead point, mild mural thickening of the into the septum without frank evidence of bowel ischemia concerning for partial functional colonic obstruction with moderate gaseous distention - s/p surgical intervention --> sigmoid colectomy. Has had a prolonged hospitalization complicated by shock liver and delirium. Palliative care has been asked to get involved to assist in additional goals of care conversations.    Today's Discussion 08/18/2022  *Please note that this is a verbal dictation therefore any spelling or grammatical errors are due to the "[employer]agon [employer] One" system interpretation.  Chart reviewed inclusive of vital signs, progress notes, laboratory results, and diagnostic images.   I met this morning with [name]'s RN, [name] who shares that [name] has progressively seem to be improving.  We reviewed that labs are slowly trending in the right direction.  We discussed that patient's mentation is also improving slowly but surely.  I met at bedside with [name] and she was aware of who she is and where she is.  We discussed her hospital stay and all she is endured since being here.  She was more foggy on the details of hospitalization though did seem appropriate. Created space and opportunity for patient to explore thoughts feelings and fears regarding current medical situation. ________________________________ Addendum:  I stopped by this late morning to speak to patients daughter, [name]. She shares that Sarah Moore is doing well overall. She is noted to be sitting up in the  recliner at the time of assessment - she shares that she is having some back pain. I was able to help reposition her.   Speech therapy entered the room to perform evaluation.   Per discussion with patients daughter, plan to continue with current care and hopefully ongoing improvements will occur.   Questions and concerns addressed/Palliative Support Provided.   Objective Assessment: Vital Signs Vitals:   08/18/22 0930 08/18/22 0934  BP:  97/68  Pulse: 92   Resp: (!) 22   Temp:    SpO2: 93%     Intake/Output Summary (Last 24 hours) at 08/18/2022 1010 Last data filed at 08/18/2022 4098 Gross per 24 hour  Intake 1329.04 ml  Output 1950 ml  Net -620.96 ml   Last Weight  Most recent update: 08/17/2022  3:41 AM    Weight  44.1 kg (97 lb 3.6 oz)            Gen: Elderly African-American female chronically ill in appearance HEENT: Core track in place, dry mucous membranes CV: Regular rate and rhythm  PULM: On room air breathing is even and nonlabored ABD: colostomy EXT: No edema Neuro: Alert to self  SUMMARY OF RECOMMENDATIONS   DNAR/DNI   Allow time for outcomes - slow improvements  Patient's family agree that if [name] were to acutely decline they would not want to prolong life saving measures and would rather want nature to take its course   Ongoing palliative care support  Billing based on MDM: High ______________________________________________________________________________________ [name] [facility] Palliative Medicine Team Team Cell Phone: [phone] Please utilize secure chat with additional questions, if there is no response within 30 minutes please call  the above phone number  Palliative Medicine Team providers are available by phone from 7am to 7pm daily and can be reached through the team cell phone.  Should this patient require assistance outside of these hours, please call the patient's attending physician.

## 2022-08-18 NOTE — Progress Notes (Signed)
[facility] ADULT ICU REPLACEMENT PROTOCOL   The patient does apply for the [facility] Adult ICU Electrolyte Replacment Protocol based on the criteria listed below:   1.Exclusion criteria: TCTS, ECMO, Dialysis, and Myasthenia Gravis patients 2. Is GFR >/= 30 ml/min? Yes.    Patient's GFR today is >60 3. Is SCr </= 2? Yes.   Patient's SCr is 0.65 mg/dL 4. Did SCr increase >/= 0.5 in 24 hours? No. 5.Pt's weight >40kg  Yes.   6. Abnormal electrolyte(s): Potassium 3.3  7. Electrolytes replaced per protocol 8.  Call MD STAT for K+ </= 2.5, Phos </= 1, or Mag </= 1 Physician:  Dr. Faye Ramsay A Norvella Loscalzo 08/18/2022 5:19 AM

## 2022-08-19 DIAGNOSIS — K561 Intussusception: Secondary | ICD-10-CM | POA: Diagnosis not present

## 2022-08-19 DIAGNOSIS — K7201 Acute and subacute hepatic failure with coma: Secondary | ICD-10-CM

## 2022-08-19 DIAGNOSIS — E876 Hypokalemia: Secondary | ICD-10-CM | POA: Diagnosis not present

## 2022-08-19 DIAGNOSIS — R1310 Dysphagia, unspecified: Secondary | ICD-10-CM | POA: Diagnosis not present

## 2022-08-19 DIAGNOSIS — Z515 Encounter for palliative care: Secondary | ICD-10-CM | POA: Diagnosis not present

## 2022-08-19 LAB — COMPREHENSIVE METABOLIC PANEL
ALT: 327 U/L — ABNORMAL HIGH (ref 0–44)
AST: 94 U/L — ABNORMAL HIGH (ref 15–41)
Albumin: 2.3 g/dL — ABNORMAL LOW (ref 3.5–5.0)
Alkaline Phosphatase: 186 U/L — ABNORMAL HIGH (ref 38–126)
Anion gap: 5 (ref 5–15)
BUN: 12 mg/dL (ref 8–23)
CO2: 27 mmol/L (ref 22–32)
Calcium: 8.4 mg/dL — ABNORMAL LOW (ref 8.9–10.3)
Chloride: 107 mmol/L (ref 98–111)
Creatinine, Ser: 0.64 mg/dL (ref 0.44–1.00)
GFR, Estimated: >60 mL/min (ref >60)
Glucose, Bld: 140 mg/dL — ABNORMAL HIGH (ref 70–99)
Potassium: 3.6 mmol/L (ref 3.5–5.1)
Sodium: 139 mmol/L (ref 135–145)
Total Bilirubin: 16.7 mg/dL — ABNORMAL HIGH (ref 0.3–1.2)
Total Protein: 5.4 g/dL — ABNORMAL LOW (ref 6.5–8.1)

## 2022-08-19 LAB — CBC
HCT: 25.6 % — ABNORMAL LOW (ref 36.0–46.0)
Hemoglobin: 8.9 g/dL — ABNORMAL LOW (ref 12.0–15.0)
MCH: 31.1 pg (ref 26.0–34.0)
MCHC: 34.8 g/dL (ref 30.0–36.0)
MCV: 89.5 fL (ref 80.0–100.0)
Platelets: 181 10*3/uL (ref 150–400)
RBC: 2.86 MIL/uL — ABNORMAL LOW (ref 3.87–5.11)
RDW: 19.6 % — ABNORMAL HIGH (ref 11.5–15.5)
WBC: 11.9 10*3/uL — ABNORMAL HIGH (ref 4.0–10.5)
nRBC: 0.8 % — ABNORMAL HIGH (ref 0.0–0.2)

## 2022-08-19 LAB — TYPE AND SCREEN
Unit division: 0
Unit division: 0

## 2022-08-19 LAB — GLUCOSE, CAPILLARY
Glucose-Capillary: 130 mg/dL — ABNORMAL HIGH (ref 70–99)
Glucose-Capillary: 150 mg/dL — ABNORMAL HIGH (ref 70–99)
Glucose-Capillary: 164 mg/dL — ABNORMAL HIGH (ref 70–99)
Glucose-Capillary: 121 mg/dL — ABNORMAL HIGH (ref 70–99)

## 2022-08-19 LAB — BPAM RBC
Blood Product Expiration Date: 202405302359
ISSUE DATE / TIME: 202405011459
Unit Type and Rh: 7300

## 2022-08-19 LAB — HEMOGLOBIN A1C
Hgb A1c MFr Bld: 5.5 % (ref 4.8–5.6)
Mean Plasma Glucose: 111.15 mg/dL

## 2022-08-19 MED ORDER — FUROSEMIDE 10 MG/ML IJ SOLN
20.0000 mg | Freq: Two times a day (BID) | INTRAMUSCULAR | Status: AC
  Administered 2022-08-19 – 2022-08-20 (×2): 20 mg via INTRAVENOUS
  Filled 2022-08-19 (×2): qty 2

## 2022-08-19 MED ORDER — LIDOCAINE 5 % EX OINT
TOPICAL_OINTMENT | Freq: Three times a day (TID) | CUTANEOUS | Status: DC | PRN
  Administered 2022-08-21: 1 via TOPICAL
  Filled 2022-08-19: qty 35.44

## 2022-08-19 MED ORDER — POTASSIUM CHLORIDE 20 MEQ PO PACK
40.0000 meq | PACK | Freq: Once | ORAL | Status: AC
  Administered 2022-08-19: 40 meq via ORAL
  Filled 2022-08-19: qty 2

## 2022-08-19 NOTE — Progress Notes (Signed)
   Palliative Medicine Inpatient Follow Up Note   HPI: Sarah Moore is a 72 y.o. female with past medical history significant for hyperlipidemia, chronic constipation, history of remote DVT not on anticoagulation who presented to  on 4/22 with complaints of abdominal pain. CT abdomen/pelvis with colocolonic intussusception at the junction of the descending and sigmoid colon likely due to a large intraluminal lipoma serving as a lead Moore, mild mural thickening of the into the septum without frank evidence of bowel ischemia concerning for partial functional colonic obstruction with moderate gaseous distention - s/p surgical intervention --> sigmoid colectomy. Has had a prolonged hospitalization complicated by shock liver and delirium. Palliative care has been asked to get involved to assist in additional goals of care conversations.    Today's Discussion 08/19/2022  *Please note that this is a verbal dictation therefore any spelling or grammatical errors are due to the "[employer] One" system interpretation.  Chart reviewed inclusive of vital signs, progress notes, laboratory results, and diagnostic images. Continue on enteral nutrition.   I met this morning with Sarah Moore who was more alert and oriented. She requested some ginger ale which I was able to provide for her. I was able to help with repositioning in bed. We reviewed the importance of movement and rebuilding of strength after Sarah Moore's complicated hospital course. Sarah Moore did not experience significant pain or nausea at the time of assessment.  Plan for possible acute rehabilitation.   Questions and concerns addressed/Palliative Support Provided.   Objective Assessment: Vital Signs Vitals:   08/19/22 0752 08/19/22 1553  BP: 115/68 111/68  Pulse: 95 90  Resp: 17 18  Temp: 98 F (36.7 C) 97.7 F (36.5 C)  SpO2: 100% 100%    Intake/Output Summary (Last 24 hours) at 08/19/2022 1737 Last data filed at 08/19/2022 1715 Gross per 24 hour   Intake --  Output 2545 ml  Net -2545 ml    Last Weight  Most recent update: 08/19/2022  4:47 AM    Weight  61 kg (134 lb 7.7 oz)            Gen: Elderly African-American female chronically ill in appearance HEENT: Core track in place, dry mucous membranes CV: Regular rate and rhythm  PULM: On room air breathing is even and nonlabored ABD: colostomy EXT: No edema Neuro: Alert to self and place  SUMMARY OF RECOMMENDATIONS   DNAR/DNI   Continue present efforts to improve condition  Appreciate PT/OT consultation --> Recommend AIR   Ongoing palliative care support  Billing based on MDM: Moderate ______________________________________________________________________________________ Sarah Moore Sarah Moore Palliative Medicine Team Team Cell Phone: [phone] Please utilize secure chat with additional questions, if there is no response within 30 minutes please call the above phone number  Palliative Medicine Team providers are available by phone from 7am to 7pm daily and can be reached through the team cell phone.  Should this patient require assistance outside of these hours, please call the patient's attending physician.

## 2022-08-19 NOTE — Evaluation (Signed)
Occupational Therapy Evaluation Patient Details Name: Sarah Moore MRN: [id number] DOB: Jul 26, 1950 Today's Date: 08/19/2022   History of Present Illness 72 y.o. female presents to ED on 4/22 with complaints of abdominal pain. Dx of colonic intussuseption, s/p partial colectomy with end colostomy 08/07/22. Course complicated by confusion and hypotension starting 4/26, transfer to ICU, code stroke (negative), anemia, hyperbilirubinemia. PMH includes HLD, remote DVT, OA, GERD.   Clinical Impression   PTA, pt was living alone and was independent with ADLs and IADLs. Pt currently requiring Mod A for UB ADLs, Max A for LB ADLs, and Mod A for functional transfers.  Pt presenting with decreased cognition, balance, strength, and activity tolerance. Pt very motivated to participate despite fatigue and son present and very supportive. Pt would benefit from further acute OT to facilitate safe dc. Recommend dc to AIR for further OT to optimize safety, independence with ADLs, and return to PLOF.      Recommendations for follow up therapy are one component of a multi-disciplinary discharge planning process, led by the attending physician.  Recommendations may be updated based on patient status, additional functional criteria and insurance authorization.   Assistance Recommended at Discharge Frequent or constant Supervision/Assistance  Patient can return home with the following A lot of help with walking and/or transfers;A lot of help with bathing/dressing/bathroom    Functional Status Assessment  Patient has had a recent decline in their functional status and demonstrates the ability to make significant improvements in function in a reasonable and predictable amount of time.  Equipment Recommendations  Other (comment) (Defer to next venue)    Recommendations for Other Services PT consult     Precautions / Restrictions Precautions Precautions: Fall Precaution Comments: colostomy, abdominal surgery,  cortrak Restrictions Weight Bearing Restrictions: No      Mobility Bed Mobility Overal bed mobility: Needs Assistance Bed Mobility: Supine to Sit     Supine to sit: Mod assist     General bed mobility comments: Mod A for assisting with LEs and then elevating trunk; HOB elevated    Transfers Overall transfer level: Needs assistance Equipment used: Rolling walker (2 wheels), 1 person hand held assist Transfers: Sit to/from Stand, Bed to chair/wheelchair/BSC Sit to Stand: Mod assist Stand pivot transfers: Mod assist         General transfer comment: Mod A for power up and initating standing. Once in standing, pt stable and requiring Mod A for pivot and Max cues due to attention deficits      Balance Overall balance assessment: Needs assistance Sitting-balance support: No upper extremity supported, Feet supported Sitting balance-Leahy Scale: Fair     Standing balance support: During functional activity, Bilateral upper extremity supported, Reliant on assistive device for balance Standing balance-Leahy Scale: Poor                             ADL either performed or assessed with clinical judgement   ADL Overall ADL's : Needs assistance/impaired Eating/Feeding: NPO   Grooming: Minimal assistance;Sitting   Upper Body Bathing: Moderate assistance;Sitting   Lower Body Bathing: Maximal assistance;Sit to/from stand   Upper Body Dressing : Moderate assistance;Sitting   Lower Body Dressing: Maximal assistance;Sit to/from stand   Toilet Transfer: Moderate assistance;+2 for safety/equipment;Stand-pivot Toilet Transfer Details (indicate cue type and reason): Mod A for power up and cues for steps towards recliner. Son very encouraging         Functional mobility during ADLs: Moderate assistance (  stand pivot only) General ADL Comments: Pt presenting with decreased balance, strength, and activity tolerance.     Vision         Perception     Praxis       Pertinent Vitals/Pain Pain Assessment Pain Assessment: Faces Faces Pain Scale: Hurts a little bit Breathing: normal Negative Vocalization: none Facial Expression: smiling or inexpressive Body Language: relaxed Consolability: no need to console PAINAD Score: 0 Pain Location: nose (secondary to Cortrak tube) Pain Descriptors / Indicators: Discomfort Pain Intervention(s): Monitored during session, Limited activity within patient's tolerance, Repositioned     Hand Dominance Right   Extremity/Trunk Assessment Upper Extremity Assessment Upper Extremity Assessment: Generalized weakness   Lower Extremity Assessment Lower Extremity Assessment: Defer to PT evaluation   Cervical / Trunk Assessment Cervical / Trunk Assessment: Kyphotic   Communication Communication Communication: No difficulties   Cognition Arousal/Alertness: Lethargic (drowsy) Behavior During Therapy: WFL for tasks assessed/performed Overall Cognitive Status: Impaired/Different from baseline Area of Impairment: Attention, Following commands, Safety/judgement, Problem solving, Awareness, Orientation, Memory                 Orientation Level: Disoriented to, Place, Time Current Attention Level: Sustained Memory: Decreased short-term memory Following Commands: Follows one step commands with increased time Safety/Judgement: Decreased awareness of safety, Decreased awareness of deficits Awareness: Emergent Problem Solving: Slow processing, Decreased initiation, Difficulty sequencing, Requires verbal cues, Requires tactile cues General Comments: Pt engaged. However fatigues quickly and difficulty sustaining attention during tasks.     General Comments  Son present throughout    Exercises     Shoulder Instructions      Home Living Family/patient expects to be discharged to:: Private residence Living Arrangements: Alone Available Help at Discharge: Family;Available 24 hours/day Type of Home:  Apartment Home Access: Level entry     Home Layout: One level     Bathroom Shower/Tub: Chief Strategy Officer: Standard Bathroom Accessibility: Yes How Accessible: Accessible via walker Home Equipment: Cane - quad   Additional Comments: possible plan for dc to son's home in [place]      Prior Functioning/Environment Prior Level of Function : Independent/Modified Independent             Mobility Comments: no falls in past 6 months ADLs Comments: independent with ADLs and IADLs        OT Problem List: Decreased strength;Decreased range of motion;Decreased activity tolerance;Impaired balance (sitting and/or standing);Decreased knowledge of use of DME or AE;Decreased knowledge of precautions      OT Treatment/Interventions: Self-care/ADL training;Therapeutic exercise;Energy conservation;DME and/or AE instruction;Therapeutic activities;Patient/family education    OT Goals(Current goals can be found in the care plan section) Acute Rehab OT Goals Patient Stated Goal: Get stronger OT Goal Formulation: With patient Time For Goal Achievement: 09/02/22 Potential to Achieve Goals: Good  OT Frequency: Min 2X/week    Co-evaluation              AM-PAC OT "6 Clicks" Daily Activity     Outcome Measure Help from another person eating meals?: Total Help from another person taking care of personal grooming?: A Little Help from another person toileting, which includes using toliet, bedpan, or urinal?: A Lot Help from another person bathing (including washing, rinsing, drying)?: A Lot Help from another person to put on and taking off regular upper body clothing?: A Lot Help from another person to put on and taking off regular lower body clothing?: A Lot 6 Click Score: 12   End of Session  Equipment Utilized During Treatment: Stage manager Communication: Mobility status  Activity Tolerance: Patient tolerated treatment well;Patient limited by fatigue Patient  left: in chair;with call bell/phone within reach;with chair alarm set;with family/visitor present  OT Visit Diagnosis: Unsteadiness on feet (R26.81);Other abnormalities of gait and mobility (R26.89);Muscle weakness (generalized) (M62.81)                Time: 1610-9604 OT Time Calculation (min): 33 min Charges:  OT General Charges $OT Visit: 1 Visit OT Evaluation $OT Eval Moderate Complexity: 1 Mod OT Treatments $Self Care/Home Management : 8-22 mins  Davion Meara MSOT, OTR/L Acute Rehab Office: [phone]  Theodoro Grist Dovber Ernest 08/19/2022, 1:25 PM

## 2022-08-19 NOTE — Progress Notes (Signed)
12 Days Post-Op   Subjective/Chief Complaint: Asleep but arousable, answering questions. Denies abd pain. Tolerating TF.  Objective: Vital signs in last 24 hours: Temp:  [97.4 F (36.3 C)-98.6 F (37 C)] 98 F (36.7 C) (05/05 0752) Pulse Rate:  [90-102] 95 (05/05 0752) Resp:  [16-23] 17 (05/05 0752) BP: (87-120)/(59-68) 115/68 (05/05 0752) SpO2:  [93 %-100 %] 100 % (05/05 0752) Weight:  [61 kg] 61 kg (05/05 0400) Last BM Date : 08/18/22  Intake/Output from previous day: 05/04 0701 - 05/05 0700 In: 856.6 [P.O.:50; I.V.:12.4; NG/GT:450; IV Piggyback:344.2] Out: 4020 [Urine:3120; Stool:900] Intake/Output this shift: No intake/output data recorded. Gen: chronically ill appearing, jaundice Abd: stoma pink and functional.   wound without infection- staples intact, minimally tender- appropraite. Without guarding.   Lab Results:  Recent Labs    08/18/22 0314 08/19/22 0259  WBC 14.1* 11.9*  HGB 9.5* 8.9*  HCT 27.6* 25.6*  PLT 213 181    BMET Recent Labs    08/18/22 0314 08/19/22 0259  NA 142 139  K 3.3* 3.6  CL 111 107  CO2 26 27  GLUCOSE 145* 140*  BUN 17 12  CREATININE 0.65 0.64  CALCIUM 8.6* 8.4*    PT/INR Recent Labs    08/17/22 1005 08/18/22 0314  LABPROT 16.9* 17.2*  INR 1.4* 1.4*    ABG No results for input(s): "PHART", "HCO3" in the last 72 hours.  Invalid input(s): "PCO2", "PO2"   Studies/Results: MR ABDOMEN MRCP W WO CONTAST  Result Date: 08/17/2022 CLINICAL DATA:  Jaundice EXAM: MRI ABDOMEN WITHOUT AND WITH CONTRAST (INCLUDING MRCP) TECHNIQUE: Multiplanar multisequence MR imaging of the abdomen was performed both before and after the administration of intravenous contrast. Heavily T2-weighted images of the biliary and pancreatic ducts were obtained, and three-dimensional MRCP images were rendered by post processing. CONTRAST:  4mL GADAVIST GADOBUTROL 1 MMOL/ML IV SOLN COMPARISON:  CT abdomen and pelvis dated Aug 15, 2022 FINDINGS: Lower chest:  Small bilateral pleural effusions. Hepatobiliary: T2 hyperintense lesion of the left hepatic lobe measuring 3.0 x 2.4 cm on series 4, image 26 with peripheral discontinuous enhancement, consistent with benign hemangioma. No suspicious focal liver lesion. Portal vein is patent. Gallbladder wall thickening and sludge. No biliary ductal dilation. Pancreas: No mass, inflammatory changes, or other parenchymal abnormality identified. Spleen:  Within normal limits in size and appearance. Adrenals/Urinary Tract: Bilateral adrenal glands are unremarkable. No hydronephrosis. No suspicious renal lesions. Stomach/Bowel: Left lower quadrant colostomy. Otherwise unremarkable. Vascular/Lymphatic: Suprarenal abdominal aortic aneurysm mural thrombus measuring up to 3.4 cm, unchanged when compared with the prior. No enlarged lymph nodes seen in the abdomen. Other: Small volume abdominal ascites. Diffuse soft tissue anasarca. Musculoskeletal: No suspicious bone lesions identified. IMPRESSION: Somewhat limited exam due to motion artifact. 1. Gallbladder wall thickening and sludge. Wall thickening is possibly reactive, although acute cholecystitis could also have this appearance. If there is clinical concern, HIDA scan could be performed further evaluation. 2. No biliary ductal dilation. No evidence of choledocholithiasis, although motion artifact limits evaluation. 3. Small bilateral pleural effusions, small volume abdominal ascites, and diffuse soft tissue anasarca. 4. Suprarenal abdominal aortic aneurysm measuring up to 3.4 cm, unchanged when compared with the prior. Recommend follow-up ultrasound every 3 years. This recommendation follows ACR consensus guidelines: White Paper of the ACR Incidental Findings Committee II on Vascular Findings. J Am Coll Radiol 2013; 10:789-794. Electronically Signed   By: Allegra Lai M.D.   On: 08/17/2022 20:41    Anti-infectives: Anti-infectives (From admission, onward)  Start      Dose/Rate Route Frequency Ordered Stop   08/11/22 1500  vancomycin (VANCOREADY) IVPB 750 mg/150 mL  Status:  Discontinued        750 mg 150 mL/hr over 60 Minutes Intravenous Every 24 hours 08/10/22 1340 08/11/22 0518   08/11/22 0518  vancomycin variable dose per unstable renal function (pharmacist dosing)  Status:  Discontinued         Does not apply See admin instructions 08/11/22 0518 08/11/22 0850   08/10/22 2200  piperacillin-tazobactam (ZOSYN) IVPB 3.375 g        3.375 g 12.5 mL/hr over 240 Minutes Intravenous Every 8 hours 08/10/22 1334 08/16/22 2216   08/10/22 1430  piperacillin-tazobactam (ZOSYN) IVPB 3.375 g        3.375 g 100 mL/hr over 30 Minutes Intravenous  Once 08/10/22 1333 08/10/22 1451   08/10/22 1430  vancomycin (VANCOCIN) IVPB 1000 mg/200 mL premix        1,000 mg 200 mL/hr over 60 Minutes Intravenous  Once 08/10/22 1338 08/10/22 1507   08/07/22 1000  cefoTEtan (CEFOTAN) 2 g in sodium chloride 0.9 % 100 mL IVPB  Status:  Discontinued        2 g 200 mL/hr over 30 Minutes Intravenous On call to O.R. 08/07/22 0946 08/07/22 1005       Assessment/Plan: Colonic intussusception POD 11, s/p sigmoid colectomy/colostomy-LK - surgical path benign - became ill POD 3, CT as expected postop, no hemorrhage, better with volume, will continue to follow, she was slow to clear lactate but cleared today and clinically is better - leukocytosis improving, LFTs slowly improving. - hgb returned at 4.9 on 5/1 and CT was performed - no signs of bleeding. No signs of abscess. Received 3 u RBC and hgb is 10. Suspect 4.9 may have been lab error. There was gastric thickening on her CT which is unusual, no melena. Continue BID PPI.  - RUQ/liver U/S 5/2 for LFT elevation shows no liver lesion, thrombus, or biliary dilation. Small amt gallbladder sludge without signs of cholecystitis. MRCP without biliary obstruction, questions cholecystitis. Low clinical suspicion for this. No role for HIDA.   -ostomy is working well, Dys 2 diet ordered, unable to take in much.  Continue tube feeds. -mobilize as able -pulm toilet as she is able to understand -foley in place, defer to medical service regarding duration of this. - appreciate palliative consult: noted plans to see how she does over weekend. DNR/DNI.    FEN - Dys 2 diet, tube feeds via cortrak VTE - ok to resume lovenox or SQH from CCS standpoint if hgb remains stable - low suspicion for GIB.  ID - zosyn (aspiration PNA) Dispo - pursuing CIR admit currently  Below per primary team --  Acute metabolic encephalopathy Shock liver  Aspiration PNA GERD HLD   Vanita Panda 08/19/2022

## 2022-08-19 NOTE — Progress Notes (Addendum)
PROGRESS NOTE   JOYLIN DICHIARA  WJX:[id number]    DOB: 1951-03-08    DOA: 08/06/2022  PCP: Norm Salt, PA   I have briefly reviewed patients previous medical records in [facility].  Chief Complaint  Patient presents with   Constipation    Brief Narrative:  72 year old female with medical history significant for remote DVT, not on anticoagulation, HLD, chronic constipation presented to the ED on 08/06/2022 with complaints of abdominal pain, constipation and decreased appetite.  She had been seen in the ED 2 days prior and treated with Linzess and Fleet enema.  CT abdomen/pelvis showed colocolonic intussusception at the junction of descending and sigmoid colon felt due to intraluminal lipoma measuring 6.4 x 4.5 cm.  This General surgery consulted and underwent partial colectomy and end colostomy on 4/23.  Was complicated by confusion, hypotension, hypoglycemia, strokelike symptoms, acute hepatic/metabolic encephalopathy and anemia.  Briefly required vasopressors.  Stroke workup negative.  Hemoglobin of 4.9 felt to be a lab error.  Stabilized and care transferred from ICU/CCM to [facility] and floor on 08/19/2022.  Please refer to PCCM transfer note/aggress note from 08/18/2022 for details prior to that date.   Assessment & Plan:  Principal Problem:   Colonic intussusception (HCC) Active Problems:   DVT, HX OF   Hypokalemia   HLD (hyperlipidemia)   Septic shock (HCC)   Acute metabolic encephalopathy due to hypoglycemia   Lactic acidosis   Cerebrovascular accident (CVA) (HCC)   Protein-calorie malnutrition, severe   Obstructive jaundice   Hyperbilirubinemia   ABLA (acute blood loss anemia)   Hemorrhagic shock (HCC)   Acute metabolic encephalopathy   Acute hepatic and metabolic encephalopathy -Extensive neurologic workup done thus far including negative, head CT, MRI, and LTM.  Ammonia remains elevated at 45 - Continue delirium precautions, and bed mobility, holding sedating meds.   Family bedside frequently has helped. - Promote appropriate day night cycles. - Continue lactulose 3 times daily -Improved or even resolved.   Acute blood loss anemia, anemia of critical illness -Concern for GI process, now off pressors and lactic has cleared as of 5/1 - Transfuse for hemoglobin less than 7 or hemodynamically significant bleeding - Significant drop from 7.2 to 4.9 as of 5/1. No signs of external or radiographic signs of bleeding. Patient also remains hemodynamically stable -From chart review, hemoglobin of 4.9 on 5/1 was felt to be a lab error.   Colonic intussusception now s/p sigmoid colectomy w/ colostomy 4/23 -General surgery continues to follow and management per them -Surgical pathology benign. -Colostomy working well.  On dysphagia 2 diet but not taking much p.o. and hence continuing on tube feeds via core track.  When able to tolerate p.o. adequately, DC tube feeds and core track. -Complained of pain at NG tube site, topical lidocaine gel. -Ostomy management per [facility] RN team..   Elevated LFTs secondary to shock liver vs obstructive pathology  Hyperbilirubinemia/jaundice -Mildly distended gallbladder on CT, sludge in gallbladder on [place] 4/28, can not rule out obstructing stone at this time  -As per surgical follow-up, low suspicion for cholecystitis and no role for HIDA. -GI saw and signed off 5/1/Dr. Loreta Ave -Continue lactulose 3 times daily.  Avoid phytotoxins. -LFTs steadily improving.   Anasarca with global volume overload  Hypoalbuminemia   -Had positive fluid balance in ICU.  Diuresed with albumin. -Put out 3120 mL urine output yesterday, -3.1 L now. -Discontinued Foley catheter 5/5. -Continue gentle diuresis and follow BMP.   Possible GI bleed  -  Patient with large BM in ostomy on 4/28 with dark tarry appearing stool.  No further blood noted in ostomy since.  - Trial resuming chemical DVT prophylaxis - Continue high-dose PPI   Hypokalemia  - Aggressive  repletion, continue to monitor   Hyperglycemia -SSI as needed - Well-controlled CBGs.  No recent A1c, added to this morning labs.   Dysphagia -Advance diet per SLP, continue core track feedings.  Currently on dysphagia 2 diet and thin liquids.  When tolerating PO well, can DC tube feeds and core track.   Deconditioning -Aggressive PT, OT, SLP - Out of bed to chair today. -CIR consult placed for when she is medically ready for discharge; not there yet but hopefully should be there in the next couple of days.   AAA, suprarenal- incidental finding on MRI -needs follow up US in 3 years    Body mass index is 26.26 kg/m.  Nutritional Status Nutrition Problem: Severe Malnutrition Etiology: acute illness Signs/Symptoms: mild fat depletion, moderate muscle depletion, energy intake < or equal to 50% for > or equal to 5 days Interventions: TPN   ACP Documents: None present DVT prophylaxis: heparin injection 5,000 Units Start: 08/18/22 0900 SCDs Start: 08/06/22 1950     Code Status: DNR:  Family Communication: Discussed in detail with patient's son via phone, updated care and answered all questions Disposition:  Status is: Inpatient Remains inpatient appropriate because: Ongoing NG tube, tube feeds, serial lab monitoring     Consultants:   General surgery PCCM GI/Dr. Loreta Ave Neurology Palliative care medicine  Procedures:   As noted above  Antimicrobials:   Completed course of antibiotics on 5/2.   Subjective:  Patient awake alert and oriented except to date (Aug 18, 2022) which is understandable.  Denies abdominal pain or complaints.  Aware that she is in the hospital due to "bowel obstruction".  Objective:   Vitals:   08/18/22 1900 08/18/22 2308 08/19/22 0400 08/19/22 0752  BP: 109/61 115/67 120/68 115/68  Pulse: 96 97 91 95  Resp: 18 16 16 17   Temp: 98.2 F (36.8 C) 98.4 F (36.9 C) (!) 97.4 F (36.3 C) 98 F (36.7 C)  TempSrc: Oral Oral Oral Oral  SpO2: 98% 97%  100% 100%  Weight:   61 kg   Height:        General exam: Elderly female, moderately built and frail sitting up comfortably in bed without distress.  Oral mucosa moist.  Left nare core track in place with tube feeds ongoing.  Significant scleral and skin icterus present. Respiratory system: Slightly diminished breath sounds in the bases but otherwise clear to auscultation.  On room air. Cardiovascular system: S1 & S2 heard, RRR. No JVD, murmurs, rubs, gallops or clicks.  Trace bilateral ankle edema.  Not on telemetry. Gastrointestinal system: Abdomen is nondistended, soft and nontender. No organomegaly or masses felt. Normal bowel sounds heard.  Midline laparotomy staples intact and no acute findings.  Colostomy functioning well.  Bowel sounds heard. Central nervous system: Alert and oriented. No focal neurological deficits.  No asterixis appreciated. Extremities: Symmetric 5 x 5 power. Skin: No rashes, lesions or ulcers Psychiatry: Judgement and insight appear normal. Mood & affect appropriate.     Data Reviewed:   I have personally reviewed following labs and imaging studies   CBC: Recent Labs  Lab 08/17/22 1005 08/18/22 0314 08/19/22 0259  WBC 17.8* 14.1* 11.9*  HGB 9.9* 9.5* 8.9*  HCT 28.3* 27.6* 25.6*  MCV 87.9 88.7 89.5  PLT 262  213 181    Basic Metabolic Panel: Recent Labs  Lab 08/14/22 0548 08/15/22 0709 08/15/22 1355 08/16/22 0321 08/17/22 0319 08/18/22 0314 08/19/22 0259  NA 140 144  --  143 142 142 139  K 4.2 3.4*  --  3.5 3.7 3.3* 3.6  CL 104 113*  --  110 108 111 107  CO2 29 28  --  28 25 26 27   GLUCOSE 190* 137*  --  161* 134* 145* 140*  BUN 25* 21  --  19 20 17 12   CREATININE 0.71 0.79  --  0.81 0.73 0.65 0.64  CALCIUM 8.0* 8.2*  --  8.6* 8.9 8.6* 8.4*  MG 1.9 1.8 1.8 1.7 2.1 2.0  --   PHOS 2.1* 3.2 3.8 3.9 4.5  --   --     Liver Function Tests: Recent Labs  Lab 08/15/22 1435 08/16/22 0321 08/17/22 0319 08/18/22 0314 08/19/22 0259  AST  182* 159* 152* 135* 94*  ALT 909* 763* 622* 496* 327*  ALKPHOS 161* 187* 216* 227* 186*  BILITOT 28.1* 28.7* 24.6* 20.1* 16.7*  PROT 4.3* 4.7* 4.8* 5.0* 5.4*  ALBUMIN <1.5* 1.6* <1.5* <1.5* 2.3*    CBG: Recent Labs  Lab 08/19/22 0405 08/19/22 0754 08/19/22 1120  GLUCAP 130* 150* 164*    Microbiology Studies:   Recent Results (from the past 240 hour(s))  MRSA Next Gen by PCR, Nasal     Status: None   Collection Time: 08/10/22 11:13 AM   Specimen: Nasal Mucosa; Nasal Swab  Result Value Ref Range Status   MRSA by PCR Next Gen NOT DETECTED NOT DETECTED Final    Comment: (NOTE) The GeneXpert MRSA Assay (FDA approved for NASAL specimens only), is one component of a comprehensive MRSA colonization surveillance program. It is not intended to diagnose MRSA infection nor to guide or monitor treatment for MRSA infections. Test performance is not FDA approved in patients less than 54 years old. Performed at [facility], [address], [place], [place] [address]   Culture, blood (Routine X 2) w Reflex to ID Panel     Status: None   Collection Time: 08/10/22  3:30 PM   Specimen: BLOOD LEFT HAND  Result Value Ref Range Status   Specimen Description   Final    BLOOD LEFT HAND Performed at [facility], [address], [place], [place] [address]    Special Requests   Final    BOTTLES DRAWN AEROBIC ONLY Blood Culture adequate volume Performed at [facility], [address], [place], [place] [address]    Culture   Final    NO GROWTH 5 DAYS Performed at [facility], [address], [place], [place] [address]    Report Status 08/15/2022 FINAL  Final  Culture, blood (Routine X 2) w Reflex to ID Panel     Status: None   Collection Time: 08/10/22  6:01 PM   Specimen: BLOOD LEFT HAND  Result Value Ref Range Status   Specimen Description   Final    BLOOD LEFT HAND Performed at [facility], [address] [place], [place]  [address]    Special Requests   Final    BOTTLES DRAWN AEROBIC ONLY Blood Culture results may not be optimal due to an inadequate volume of blood received in culture bottles Performed at [facility], [address], [place], [place] [address]    Culture   Final    NO  GROWTH 5 DAYS Performed at [facility], [address], [place], [place] [address]    Report Status 08/15/2022 FINAL  Final    Radiology Studies:  MR ABDOMEN MRCP W WO CONTAST  Result Date: 08/17/2022 CLINICAL DATA:  Jaundice EXAM: MRI ABDOMEN WITHOUT AND WITH CONTRAST (INCLUDING MRCP) TECHNIQUE: Multiplanar multisequence MR imaging of the abdomen was performed both before and after the administration of intravenous contrast. Heavily T2-weighted images of the biliary and pancreatic ducts were obtained, and three-dimensional MRCP images were rendered by post processing. CONTRAST:  4mL GADAVIST GADOBUTROL 1 MMOL/ML IV SOLN COMPARISON:  CT abdomen and pelvis dated Aug 15, 2022 FINDINGS: Lower chest: Small bilateral pleural effusions. Hepatobiliary: T2 hyperintense lesion of the left hepatic lobe measuring 3.0 x 2.4 cm on series 4, image 26 with peripheral discontinuous enhancement, consistent with benign hemangioma. No suspicious focal liver lesion. Portal vein is patent. Gallbladder wall thickening and sludge. No biliary ductal dilation. Pancreas: No mass, inflammatory changes, or other parenchymal abnormality identified. Spleen:  Within normal limits in size and appearance. Adrenals/Urinary Tract: Bilateral adrenal glands are unremarkable. No hydronephrosis. No suspicious renal lesions. Stomach/Bowel: Left lower quadrant colostomy. Otherwise unremarkable. Vascular/Lymphatic: Suprarenal abdominal aortic aneurysm mural thrombus measuring up to 3.4 cm, unchanged when compared with the prior. No enlarged lymph nodes seen in the abdomen. Other: Small volume abdominal ascites. Diffuse soft tissue anasarca. Musculoskeletal:  No suspicious bone lesions identified. IMPRESSION: Somewhat limited exam due to motion artifact. 1. Gallbladder wall thickening and sludge. Wall thickening is possibly reactive, although acute cholecystitis could also have this appearance. If there is clinical concern, HIDA scan could be performed further evaluation. 2. No biliary ductal dilation. No evidence of choledocholithiasis, although motion artifact limits evaluation. 3. Small bilateral pleural effusions, small volume abdominal ascites, and diffuse soft tissue anasarca. 4. Suprarenal abdominal aortic aneurysm measuring up to 3.4 cm, unchanged when compared with the prior. Recommend follow-up ultrasound every 3 years. This recommendation follows ACR consensus guidelines: White Paper of the ACR Incidental Findings Committee II on Vascular Findings. J Am Coll Radiol 2013; 10:789-794. Electronically Signed   By: Allegra Lai M.D.   On: 08/17/2022 20:41    Scheduled Meds:    Chlorhexidine Gluconate Cloth  6 each Topical Daily   heparin injection (subcutaneous)  5,000 Units Subcutaneous Q8H   insulin aspart  0-9 Units Subcutaneous Q6H   lactulose  20 g Per Tube TID   mouth rinse  15 mL Mouth Rinse 4 times per day   pantoprazole (PROTONIX) IV  40 mg Intravenous Q12H    Continuous Infusions:    sodium chloride 5 mL/hr at 08/18/22 1200   feeding supplement (OSMOLITE 1.5 CAL) 55 mL/hr at 08/18/22 1200     LOS: 13 days     Marcellus Scott, MD,  FACP, [facility], [facility], [facility], [facility]   [facility] [place]     To contact the attending provider between 7A-7P or the covering provider during after hours 7P-7A, please log into the web site [email or web address] and access using universal [place] password for that web site. If you do not have the password, please call the hospital operator.  08/19/2022, 11:34 AM

## 2022-08-20 DIAGNOSIS — K561 Intussusception: Secondary | ICD-10-CM | POA: Diagnosis not present

## 2022-08-20 DIAGNOSIS — E876 Hypokalemia: Secondary | ICD-10-CM | POA: Diagnosis not present

## 2022-08-20 DIAGNOSIS — Z515 Encounter for palliative care: Secondary | ICD-10-CM | POA: Diagnosis not present

## 2022-08-20 DIAGNOSIS — R1310 Dysphagia, unspecified: Secondary | ICD-10-CM | POA: Diagnosis not present

## 2022-08-20 LAB — COMPREHENSIVE METABOLIC PANEL
ALT: 269 U/L — ABNORMAL HIGH (ref 0–44)
AST: 96 U/L — ABNORMAL HIGH (ref 15–41)
Albumin: 2 g/dL — ABNORMAL LOW (ref 3.5–5.0)
Alkaline Phosphatase: 172 U/L — ABNORMAL HIGH (ref 38–126)
Anion gap: 6 (ref 5–15)
BUN: 12 mg/dL (ref 8–23)
CO2: 26 mmol/L (ref 22–32)
Calcium: 8.7 mg/dL — ABNORMAL LOW (ref 8.9–10.3)
Chloride: 106 mmol/L (ref 98–111)
Creatinine, Ser: 0.63 mg/dL (ref 0.44–1.00)
GFR, Estimated: >60 mL/min (ref >60)
Glucose, Bld: 136 mg/dL — ABNORMAL HIGH (ref 70–99)
Potassium: 4.1 mmol/L (ref 3.5–5.1)
Sodium: 138 mmol/L (ref 135–145)
Total Bilirubin: 13.1 mg/dL — ABNORMAL HIGH (ref 0.3–1.2)
Total Protein: 5.7 g/dL — ABNORMAL LOW (ref 6.5–8.1)

## 2022-08-20 LAB — MAGNESIUM: Magnesium: 1.9 mg/dL (ref 1.7–2.4)

## 2022-08-20 LAB — GLUCOSE, CAPILLARY
Glucose-Capillary: 104 mg/dL — ABNORMAL HIGH (ref 70–99)
Glucose-Capillary: 109 mg/dL — ABNORMAL HIGH (ref 70–99)
Glucose-Capillary: 116 mg/dL — ABNORMAL HIGH (ref 70–99)
Glucose-Capillary: 118 mg/dL — ABNORMAL HIGH (ref 70–99)
Glucose-Capillary: 122 mg/dL — ABNORMAL HIGH (ref 70–99)
Glucose-Capillary: 128 mg/dL — ABNORMAL HIGH (ref 70–99)
Glucose-Capillary: 128 mg/dL — ABNORMAL HIGH (ref 70–99)

## 2022-08-20 LAB — CBC
HCT: 27.2 % — ABNORMAL LOW (ref 36.0–46.0)
Hemoglobin: 8.9 g/dL — ABNORMAL LOW (ref 12.0–15.0)
MCH: 30.3 pg (ref 26.0–34.0)
MCHC: 32.7 g/dL (ref 30.0–36.0)
MCV: 92.5 fL (ref 80.0–100.0)
Platelets: 163 10*3/uL (ref 150–400)
RBC: 2.94 MIL/uL — ABNORMAL LOW (ref 3.87–5.11)
RDW: 20.7 % — ABNORMAL HIGH (ref 11.5–15.5)
WBC: 14.7 10*3/uL — ABNORMAL HIGH (ref 4.0–10.5)
nRBC: 0 % (ref 0.0–0.2)

## 2022-08-20 LAB — TRIGLYCERIDES: Triglycerides: 137 mg/dL (ref <150)

## 2022-08-20 LAB — PHOSPHORUS: Phosphorus: 3 mg/dL (ref 2.5–4.6)

## 2022-08-20 MED ORDER — BOOST / RESOURCE BREEZE PO LIQD CUSTOM
1.0000 | Freq: Three times a day (TID) | ORAL | Status: DC
  Administered 2022-08-20 – 2022-08-25 (×9): 1 via ORAL

## 2022-08-20 NOTE — Progress Notes (Signed)
13 Days Post-Op   Subjective/Chief Complaint: No new complaints. Oriented to self, place, and situation. Denies abdominal pain/n/v  Objective: Vital signs in last 24 hours: Temp:  [97.7 F (36.5 C)-99 F (37.2 C)] 99 F (37.2 C) (05/06 0758) Pulse Rate:  [90-99] 90 (05/06 0758) Resp:  [17-22] 17 (05/06 0758) BP: (106-115)/(65-81) 115/70 (05/06 0758) SpO2:  [100 %] 100 % (05/06 0758) Weight:  [55.7 kg] 55.7 kg (05/06 0500) Last BM Date : 08/19/22  Intake/Output from previous day: 05/05 0701 - 05/06 0700 In: -  Out: 2850 [Urine:1850; Stool:1000] Intake/Output this shift: No intake/output data recorded. Gen: chronically ill appearing, jaundice Abd: stoma pink and functional - gas and stool in pouch wound without infection- staples intact, minimally tender- appropriate. Without guarding.   Lab Results:  Recent Labs    08/19/22 0259 08/20/22 0655  WBC 11.9* 14.7*  HGB 8.9* 8.9*  HCT 25.6* 27.2*  PLT 181 163    BMET Recent Labs    08/19/22 0259 08/20/22 0655  NA 139 138  K 3.6 4.1  CL 107 106  CO2 27 26  GLUCOSE 140* 136*  BUN 12 12  CREATININE 0.64 0.63  CALCIUM 8.4* 8.7*    PT/INR Recent Labs    08/17/22 1005 08/18/22 0314  LABPROT 16.9* 17.2*  INR 1.4* 1.4*    ABG No results for input(s): "PHART", "HCO3" in the last 72 hours.  Invalid input(s): "PCO2", "PO2"   Studies/Results: No results found.  Anti-infectives: Anti-infectives (From admission, onward)    Start     Dose/Rate Route Frequency Ordered Stop   08/11/22 1500  vancomycin (VANCOREADY) IVPB 750 mg/150 mL  Status:  Discontinued        750 mg 150 mL/hr over 60 Minutes Intravenous Every 24 hours 08/10/22 1340 08/11/22 0518   08/11/22 0518  vancomycin variable dose per unstable renal function (pharmacist dosing)  Status:  Discontinued         Does not apply See admin instructions 08/11/22 0518 08/11/22 0850   08/10/22 2200  piperacillin-tazobactam (ZOSYN) IVPB 3.375 g        3.375  g 12.5 mL/hr over 240 Minutes Intravenous Every 8 hours 08/10/22 1334 08/16/22 2216   08/10/22 1430  piperacillin-tazobactam (ZOSYN) IVPB 3.375 g        3.375 g 100 mL/hr over 30 Minutes Intravenous  Once 08/10/22 1333 08/10/22 1451   08/10/22 1430  vancomycin (VANCOCIN) IVPB 1000 mg/200 mL premix        1,000 mg 200 mL/hr over 60 Minutes Intravenous  Once 08/10/22 1338 08/10/22 1507   08/07/22 1000  cefoTEtan (CEFOTAN) 2 g in sodium chloride 0.9 % 100 mL IVPB  Status:  Discontinued        2 g 200 mL/hr over 30 Minutes Intravenous On call to O.R. 08/07/22 0946 08/07/22 1005       Assessment/Plan: Colonic intussusception POD 13, s/p sigmoid colectomy/colostomy-LK 4/23 - surgical path benign - became ill POD 3, CT as expected postop, no hemorrhage, better with volume, will continue to follow, she was slow to clear lactate but cleared today and clinically is better - leukocytosis improving, LFTs slowly improving. - hgb returned at 4.9 on 5/1 and CT was performed - no signs of bleeding. No signs of abscess. Received 3 u RBC and hgb is 10. Suspect 4.9 may have been lab error. There was gastric thickening on her CT which is unusual, no melena. Continue BID PPI.  - RUQ/liver U/S 5/2 for LFT elevation  shows no liver lesion, thrombus, or biliary dilation. Small amt gallbladder sludge without signs of cholecystitis. MRCP without biliary obstruction, questions cholecystitis. Low clinical suspicion for this. No role for HIDA.  -ostomy is working well, Dys 2 diet ordered, unable to take in much. Can advance diet/wean Tf as tolerated with improved PO intake -mobilize as able -pulm toilet - appreciate palliative consult: noted plans to see how she does over weekend. DNR/DNI.   - she will need staple removal in next day or so - stable for CIR from surgical standpoint   FEN - Dys 2 diet, tube feeds via cortrak VTE - ok to resume lovenox or SQH from CCS standpoint if hgb remains stable - low suspicion  for GIB.  ID - zosyn (aspiration PNA) Dispo - pursuing CIR admit currently  Below per primary team --  Acute metabolic encephalopathy Shock liver  Aspiration PNA GERD HLD  Eric Form, [facility] 08/20/2022, 9:11 AM Please see Amion for pager number during day hours 7:00am-4:30pm

## 2022-08-20 NOTE — Discharge Instructions (Signed)
[facility]      [facility] [phone]  OPEN ABDOMINAL SURGERY: POST OP INSTRUCTIONS  Always review your discharge instruction sheet given to you by the facility where your surgery was performed.  IF YOU HAVE DISABILITY OR FAMILY LEAVE FORMS, YOU MUST BRING THEM TO THE OFFICE FOR PROCESSING.  PLEASE DO NOT GIVE THEM TO YOUR DOCTOR.  A prescription for pain medication may be given to you upon discharge.  Take your pain medication as prescribed, if needed.  If narcotic pain medicine is not needed, then you may take acetaminophen (Tylenol) or ibuprofen (Advil) as needed. Take your usually prescribed medications unless otherwise directed. If you need a refill on your pain medication, please contact your pharmacy. They will contact our office to request authorization.  Prescriptions will not be filled after 5pm or on week-ends. You should follow a light diet the first few days after arrival home, such as soup and crackers, pudding, etc.unless your doctor has advised otherwise. A high-fiber, low fat diet can be resumed as tolerated.   Be sure to include lots of fluids daily. Most patients will experience some swelling and bruising on the chest and neck area.  Ice packs will help.  Swelling and bruising can take several days to resolve Most patients will experience some swelling and bruising in the area of the incision. Ice pack will help. Swelling and bruising can take several days to resolve..  It is common to experience some constipation if taking pain medication after surgery.  Increasing fluid intake and taking a stool softener will usually help or prevent this problem from occurring.  A mild laxative (Milk of Magnesia or Miralax) should be taken according to package directions if there are no bowel movements after 48 hours.  You may have steri-strips (small skin tapes) in place directly over the incision.  These strips should be left on the skin for 7-10 days.  If your surgeon used skin  glue on the incision, you may shower in 24 hours.  The glue will flake off over the next 2-3 weeks.  Any sutures or staples will be removed at the office during your follow-up visit. You may find that a light gauze bandage over your incision may keep your staples from being rubbed or pulled. You may shower and replace the bandage daily. ACTIVITIES:  You may resume regular (light) daily activities beginning the next day--such as daily self-care, walking, climbing stairs--gradually increasing activities as tolerated.  You may have sexual intercourse when it is comfortable.  Refrain from any heavy lifting or straining until approved by your doctor. You may drive when you no longer are taking prescription pain medication, you can comfortably wear a seatbelt, and you can safely maneuver your car and apply brakes Return to Work: ___________________________________ You should see your doctor in the office for a follow-up appointment approximately two weeks after your surgery.  Make sure that you call for this appointment within a day or two after you arrive home to insure a convenient appointment time. OTHER INSTRUCTIONS:  _____________________________________________________________ _____________________________________________________________  WHEN TO CALL YOUR DOCTOR: Fever over 101.0 Inability to urinate Nausea and/or vomiting Extreme swelling or bruising Continued bleeding from incision. Increased pain, redness, or drainage from the incision. Difficulty swallowing or breathing Muscle cramping or spasms. Numbness or tingling in hands or feet or around lips.  The clinic staff is available to answer your questions during regular business hours.  Please don't hesitate to call and ask to speak to one of   the nurses if you have concerns.  For further questions, please visit [email or web address]  

## 2022-08-20 NOTE — Progress Notes (Addendum)
   Palliative Medicine Inpatient Follow Up Note   HPI: Sarah Moore is a 72 y.o. female with past medical history significant for hyperlipidemia, chronic constipation, history of remote DVT not on anticoagulation who presented to  on 4/22 with complaints of abdominal pain. CT abdomen/pelvis with colocolonic intussusception at the junction of the descending and sigmoid colon likely due to a large intraluminal lipoma serving as a lead point, mild mural thickening of the into the septum without frank evidence of bowel ischemia concerning for partial functional colonic obstruction with moderate gaseous distention - s/p surgical intervention --> sigmoid colectomy. Has had a prolonged hospitalization complicated by shock liver and delirium. Palliative care has been asked to get involved to assist in additional goals of care conversations.    Today's Discussion 08/20/2022  *Please note that this is a verbal dictation therefore any spelling or grammatical errors are due to the "Dragon Medical One" system interpretation.  Chart reviewed inclusive of vital signs, progress notes, laboratory results, and diagnostic images. Continue on enteral nutrition.   I met with Sarah Moore this morning. She was resting comfortably. She is more clear minded this morning though shares a great deal associated with her hospitalization is not clear to her. We reviewed the complexities of her hospital stay through discussion of recent progress notes. She seems surprised at all her body has gone through. I was able to provide her some water.   Sarah Moore at this time is hopeful for continued improvements. She is aware that this may take time and her function may never be back to where it was.   Plan for acute rehabilitation this week.   Questions and concerns addressed/Palliative Support Provided.   Objective Assessment: Vital Signs Vitals:   08/20/22 1632 08/20/22 1637  BP: 107/66 (!) 140/42  Pulse: 89 81  Resp: 17 17  Temp: 98.9  F (37.2 C) 98 F (36.7 C)  SpO2: 100% 93%    Intake/Output Summary (Last 24 hours) at 08/20/2022 1643 Last data filed at 08/20/2022 1300 Gross per 24 hour  Intake 120 ml  Output 2725 ml  Net -2605 ml    Last Weight  Most recent update: 08/20/2022  6:37 AM    Weight  55.7 kg (122 lb 12.7 oz)            Gen: Elderly African-American female chronically ill in appearance HEENT: Core track in place, dry mucous membranes CV: Regular rate and rhythm  PULM: On room air breathing is even and nonlabored ABD: colostomy EXT: No edema Neuro: Alert to self and place  SUMMARY OF RECOMMENDATIONS   DNAR/DNI   Continue present efforts to improve condition  Plan for acute rehabilitation once optimized - can transition there with Coretrack    Incremental palliative care support pls call service is support is needed sooner  Billing based on MDM: Moderate ______________________________________________________________________________________ [name] [place] Palliative Medicine Team Team Cell Phone: [phone] Please utilize secure chat with additional questions, if there is no response within 30 minutes please call the above phone number  Palliative Medicine Team providers are available by phone from 7am to 7pm daily and can be reached through the team cell phone.  Should this patient require assistance outside of these hours, please call the patient's attending physician.

## 2022-08-20 NOTE — Progress Notes (Addendum)
Inpatient Rehab Admissions Coordinator:  Attempted to contact pt's son Sidonie Dickens to confirm pt's dispo. Call did not go through. Will continue to follow.  ADDENDUM 1206: Spoke with pt's son. Explained CIR goals ane expectations. He acknowledged understanding. He confirmed that he and family and friends will be able to provide 24/7 support for pt after discharge.  Insurance authorization started at 12:49 pm.   Wolfgang Phoenix, MS, CCC-SLP Admissions Coordinator [phone]

## 2022-08-20 NOTE — Progress Notes (Signed)
Physical Therapy Treatment Patient Details Name: Sarah Moore MRN: [id number] DOB: 18-Jun-1950 Today's Date: 08/20/2022   History of Present Illness 72 y.o. female presents to ED on 4/22 with complaints of abdominal pain. Dx of colonic intussuseption, s/p partial colectomy with end colostomy 08/07/22. Course complicated by confusion and hypotension starting 4/26, transfer to ICU, code stroke (negative), anemia, hyperbilirubinemia. PMH includes HLD, remote DVT, OA, GERD.    PT Comments    Pt received in supine and agreeable to session. Pt requiring increased time to respond to cues and initiate movement, however is able to do so with time. Pt requiring up to min A for all mobility tasks this session, however is limited by fatigue and requires encouragement to complete tasks. Pt able to tolerate 2 stands from EOB and a short gait distance to the recliner with slow, short steps and cues for sequencing. Pt continues to benefit from PT services to progress toward functional mobility goals.     Recommendations for follow up therapy are one component of a multi-disciplinary discharge planning process, led by the attending physician.  Recommendations may be updated based on patient status, additional functional criteria and insurance authorization.     Assistance Recommended at Discharge Frequent or constant Supervision/Assistance  Patient can return home with the following Two people to help with walking and/or transfers;Two people to help with bathing/dressing/bathroom   Equipment Recommendations  Rolling walker (2 wheels)    Recommendations for Other Services       Precautions / Restrictions Precautions Precautions: Fall Precaution Comments: colostomy, abdominal surgery, cortrak Restrictions Weight Bearing Restrictions: No     Mobility  Bed Mobility Overal bed mobility: Needs Assistance Bed Mobility: Supine to Sit     Supine to sit: Min assist     General bed mobility comments:  increased time and cues for initiation of each movement. Min A to advance BLE to EOB, but pt able to elevate trunk with use of bedrail.    Transfers Overall transfer level: Needs assistance Equipment used: Rolling walker (2 wheels) Transfers: Sit to/from Stand Sit to Stand: Min guard, Min assist           General transfer comment: Min A for power up progressing to min guard on second attempt from EOB. Pt requiring increased time due to slow power up.    Ambulation/Gait Ambulation/Gait assistance: Min guard Gait Distance (Feet): 5 Feet Assistive device: Rolling walker (2 wheels) Gait Pattern/deviations: Step-through pattern, Decreased stride length, Shuffle, Narrow base of support, Trunk flexed Gait velocity: decr     General Gait Details: Pt demonstrating small, slow steps towards recliner requiring cues for upright posture, hand placement on RW, and sequencing. Pt becoming fatigued quickly and requiring encouragement to complete distance to recliner       Balance Overall balance assessment: Needs assistance Sitting-balance support: No upper extremity supported, Feet supported Sitting balance-Leahy Scale: Fair Sitting balance - Comments: sitting EOB   Standing balance support: During functional activity, Bilateral upper extremity supported, Reliant on assistive device for balance Standing balance-Leahy Scale: Poor Standing balance comment: with RW support                            Cognition Arousal/Alertness: Awake/alert Behavior During Therapy: Flat affect Overall Cognitive Status: Impaired/Different from baseline  General Comments: Pt requires increased time to respond to and follow commands.        Exercises      General Comments        Pertinent Vitals/Pain Pain Assessment Pain Assessment: Faces Faces Pain Scale: Hurts a little bit Pain Location: Pt did not specify Pain Descriptors / Indicators:  Discomfort Pain Intervention(s): Monitored during session     PT Goals (current goals can now be found in the care plan section) Acute Rehab PT Goals Patient Stated Goal: get stronger, then go home PT Goal Formulation: With patient Time For Goal Achievement: 08/31/22 Potential to Achieve Goals: Good Progress towards PT goals: Progressing toward goals    Frequency    Min 4X/week      PT Plan Current plan remains appropriate       AM-PAC PT "6 Clicks" Mobility   Outcome Measure  Help needed turning from your back to your side while in a flat bed without using bedrails?: A Little Help needed moving from lying on your back to sitting on the side of a flat bed without using bedrails?: A Little Help needed moving to and from a bed to a chair (including a wheelchair)?: A Little Help needed standing up from a chair using your arms (e.g., wheelchair or bedside chair)?: A Little Help needed to walk in hospital room?: A Little Help needed climbing 3-5 steps with a railing? : Total 6 Click Score: 16    End of Session Equipment Utilized During Treatment: Gait belt Activity Tolerance: Patient limited by fatigue Patient left: in chair;with nursing/sitter in room;with chair alarm set;with call bell/phone within reach Nurse Communication: Mobility status PT Visit Diagnosis: Difficulty in walking, not elsewhere classified (R26.2);Pain     Time: 1137-1204 PT Time Calculation (min) (ACUTE ONLY): 27 min  Charges:  $Gait Training: 8-22 mins $Therapeutic Activity: 8-22 mins                     Johny Shock, PTA Acute Rehabilitation Services Secure Chat Preferred  [phone]    Johny Shock 08/20/2022, 12:42 PM

## 2022-08-20 NOTE — Progress Notes (Signed)
PROGRESS NOTE   Sarah Moore  EAV:[id number]    DOB: 25-Sep-1950    DOA: 08/06/2022  PCP: Norm Salt, PA   I have briefly reviewed patients previous medical records in [facility].  Chief Complaint  Patient presents with   Constipation    Brief Narrative:  72 year old female with medical history significant for remote DVT, not on anticoagulation, HLD, chronic constipation presented to the ED on 08/06/2022 with complaints of abdominal pain, constipation and decreased appetite.  She had been seen in the ED 2 days prior and treated with Linzess and Fleet enema.  CT abdomen/pelvis showed colocolonic intussusception at the junction of descending and sigmoid colon felt due to intraluminal lipoma measuring 6.4 x 4.5 cm.  This General surgery consulted and underwent partial colectomy and end colostomy on 4/23.  Was complicated by confusion, hypotension, hypoglycemia, strokelike symptoms, acute hepatic/metabolic encephalopathy and anemia.  Briefly required vasopressors.  Stroke workup negative.  Hemoglobin of 4.9 felt to be a lab error.  Stabilized and care transferred from ICU/CCM to [facility] and floor on 08/19/2022.  Please refer to PCCM transfer note/progress note from 08/18/2022 for details prior to that date.   Assessment & Plan:  Principal Problem:   Colonic intussusception (HCC) Active Problems:   DVT, HX OF   Hypokalemia   HLD (hyperlipidemia)   Septic shock (HCC)   Acute metabolic encephalopathy due to hypoglycemia   Lactic acidosis   Cerebrovascular accident (CVA) (HCC)   Protein-calorie malnutrition, severe   Obstructive jaundice   Hyperbilirubinemia   ABLA (acute blood loss anemia)   Hemorrhagic shock (HCC)   Acute metabolic encephalopathy   Acute hepatic and metabolic encephalopathy -Extensive neurologic workup done thus far including negative, head CT, MRI, and LTM.  Ammonia normalized. - Continue delirium precautions, and bed mobility, holding sedating meds.  Family  bedside frequently has helped. - Promote appropriate day night cycles. - Continue lactulose 3 times daily -Improved or even resolved.   Acute blood loss anemia, anemia of critical illness -Concern for GI process, now off pressors and lactic has cleared as of 5/1 - Transfuse for hemoglobin less than 7 or hemodynamically significant bleeding - Significant drop from 7.2 to 4.9 as of 5/1. No signs of external or radiographic signs of bleeding. Patient also remains hemodynamically stable -From chart review, hemoglobin of 4.9 on 5/1 was felt to be a lab error. - Relatively stable    Colonic intussusception now s/p sigmoid colectomy w/ colostomy 4/23 -General surgery continues to follow and management per them -Surgical pathology benign. -Colostomy working well.  On dysphagia 2 diet but not taking much p.o. and hence continuing on tube feeds via core track.  When able to tolerate p.o. adequately, DC tube feeds and core track.  Encouraged patient to take in p.o. and requested RN to document meal intake and flow charts. -Ostomy management per [facility] RN team..   Elevated LFTs secondary to shock liver vs obstructive pathology  Hyperbilirubinemia/jaundice -Mildly distended gallbladder on CT, sludge in gallbladder on [place] 4/28, can not rule out obstructing stone at this time  -As per surgical follow-up, low suspicion for cholecystitis and no role for HIDA. -GI saw and signed off 5/1/Dr. Loreta Ave -Continue lactulose 3 times daily.  Avoid phytotoxins. -LFTs continue to steadily improve and can be followed up at CIR.   Anasarca with global volume overload  Hypoalbuminemia   -Had positive fluid balance in ICU.  Diuresed with albumin. -Discontinued Foley catheter 5/5. -Volume status is improved, likely  euvolemic.  Completed IV Lasix 5/5.   Possible GI bleed  -Patient with large BM in ostomy on 4/28 with dark tarry appearing stool.  No further blood noted in ostomy since.  - Trial resuming chemical DVT  prophylaxis - Continue high-dose PPI   Hypokalemia  - Monitor and replace as needed.   Hyperglycemia -SSI as needed - Well-controlled CBGs.  A1c 5.5.   Dysphagia -Advance diet per SLP, continue core track feedings.  Currently on dysphagia 2 diet and thin liquids.  When tolerating PO well, can DC tube feeds and core track.   Deconditioning -Aggressive PT, OT, SLP - Out of bed to chair today. -Patient is medically optimized for DC to CIR if they can take the patient with the core track feeding which can be discontinued when patient tolerates orally well and trend LFTs.   AAA, suprarenal- incidental finding on MRI -needs follow up US in 3 years    Body mass index is 23.98 kg/m.  Nutritional Status Nutrition Problem: Severe Malnutrition Etiology: acute illness Signs/Symptoms: mild fat depletion, moderate muscle depletion, energy intake < or equal to 50% for > or equal to 5 days Interventions: TPN   ACP Documents: None present DVT prophylaxis: heparin injection 5,000 Units Start: 08/18/22 0900 SCDs Start: 08/06/22 1950     Code Status: DNR:  Family Communication: Discussed in detail with patient's son via phone 5/5, updated care and answered all questions Disposition:  Status is: Inpatient Remains inpatient appropriate because: Ongoing NG tube, tube feeds, serial lab monitoring     Consultants:   General surgery PCCM GI/Dr. Loreta Ave Neurology Palliative care medicine  Procedures:   As noted above  Antimicrobials:   Completed course of antibiotics on 5/2.   Subjective:  Denies complaints.  No pain reported.  As per RN, no acute issues noted.  Encourage patient to try and intake in more food by mouth.  Foley catheter discontinued 5/5 and appears to be voiding without difficulty  Objective:   Vitals:   08/19/22 2136 08/20/22 0427 08/20/22 0500 08/20/22 0758  BP: 110/81 106/65  115/70  Pulse: 93 99  90  Resp: 18 (!) 22  17  Temp: 98.8 F (37.1 C) 98.8 F (37.1  C)  99 F (37.2 C)  TempSrc: Oral Oral  Oral  SpO2: 100% 100%  100%  Weight:   55.7 kg   Height:        General exam: Elderly female, moderately built and frail sitting up comfortably in bed without distress.  Oral mucosa moist.  Left nare core track in place with tube feeds ongoing.  Significant scleral and skin icterus present. Respiratory system: Slightly diminished breath sounds in the bases but otherwise clear to auscultation.  On room air. Cardiovascular system: S1 & S2 heard, RRR. No JVD, murmurs, rubs, gallops or clicks.  Trace bilateral ankle edema.  Not on telemetry. Gastrointestinal system: Abdomen is nondistended, soft and nontender. No organomegaly or masses felt. Normal bowel sounds heard.  Midline laparotomy staples intact and no acute findings.  Colostomy functioning well.  Bowel sounds heard.  Stable without acute changes. Central nervous system: Alert and oriented x 3. No focal neurological deficits.  No asterixis appreciated. Extremities: Symmetric 5 x 5 power. Skin: No rashes, lesions or ulcers Psychiatry: Judgement and insight appear normal. Mood & affect appropriate.     Data Reviewed:   I have personally reviewed following labs and imaging studies   CBC: Recent Labs  Lab 08/18/22 0314 08/19/22 0259 08/20/22 1610  WBC 14.1* 11.9* 14.7*  HGB 9.5* 8.9* 8.9*  HCT 27.6* 25.6* 27.2*  MCV 88.7 89.5 92.5  PLT 213 181 163    Basic Metabolic Panel: Recent Labs  Lab 08/15/22 0709 08/15/22 1355 08/16/22 0321 08/17/22 0319 08/18/22 0314 08/19/22 0259 08/20/22 0655  NA 144  --  143 142 142 139 138  K 3.4*  --  3.5 3.7 3.3* 3.6 4.1  CL 113*  --  110 108 111 107 106  CO2 28  --  28 25 26 27 26   GLUCOSE 137*  --  161* 134* 145* 140* 136*  BUN 21  --  19 20 17 12 12   CREATININE 0.79  --  0.81 0.73 0.65 0.64 0.63  CALCIUM 8.2*  --  8.6* 8.9 8.6* 8.4* 8.7*  MG 1.8 1.8 1.7 2.1 2.0  --  1.9  PHOS 3.2 3.8 3.9 4.5  --   --  3.0    Liver Function  Tests: Recent Labs  Lab 08/16/22 0321 08/17/22 0319 08/18/22 0314 08/19/22 0259 08/20/22 0655  AST 159* 152* 135* 94* 96*  ALT 763* 622* 496* 327* 269*  ALKPHOS 187* 216* 227* 186* 172*  BILITOT 28.7* 24.6* 20.1* 16.7* 13.1*  PROT 4.7* 4.8* 5.0* 5.4* 5.7*  ALBUMIN 1.6* <1.5* <1.5* 2.3* 2.0*    CBG: Recent Labs  Lab 08/20/22 0541 08/20/22 0759 08/20/22 1204  GLUCAP 128* 122* 116*    Microbiology Studies:   Recent Results (from the past 240 hour(s))  Culture, blood (Routine X 2) w Reflex to ID Panel     Status: None   Collection Time: 08/10/22  3:30 PM   Specimen: BLOOD LEFT HAND  Result Value Ref Range Status   Specimen Description   Final    BLOOD LEFT HAND Performed at [facility], [address] [place], [place] [address]    Special Requests   Final    BOTTLES DRAWN AEROBIC ONLY Blood Culture adequate volume Performed at [facility], [address], [place], [place] [address]    Culture   Final    NO GROWTH 5 DAYS Performed at [facility], [address], [place], [place] [address]    Report Status 08/15/2022 FINAL  Final  Culture, blood (Routine X 2) w Reflex to ID Panel     Status: None   Collection Time: 08/10/22  6:01 PM   Specimen: BLOOD LEFT HAND  Result Value Ref Range Status   Specimen Description   Final    BLOOD LEFT HAND Performed at [facility], [address], [place], [place] [address]    Special Requests   Final    BOTTLES DRAWN AEROBIC ONLY Blood Culture results may not be optimal due to an inadequate volume of blood received in culture bottles Performed at [facility], [address], [place], [place] [address]    Culture   Final    NO GROWTH 5 DAYS Performed at [facility], [address], [place], [place] [address]    Report Status 08/15/2022 FINAL  Final    Radiology Studies:  No results found.  Scheduled Meds:    Chlorhexidine Gluconate Cloth  6 each Topical  Daily   heparin injection (subcutaneous)  5,000 Units Subcutaneous Q8H   insulin aspart  0-9 Units Subcutaneous Q6H   lactulose  20 g Per Tube TID   mouth rinse  15 mL Mouth Rinse 4 times per day   pantoprazole (  PROTONIX) IV  40 mg Intravenous Q12H    Continuous Infusions:    sodium chloride 5 mL/hr at 08/18/22 1200   feeding supplement (OSMOLITE 1.5 CAL) 1,000 mL (08/19/22 2320)     LOS: 14 days     Marcellus Scott, MD,  FACP, [facility], [facility], [facility], [facility]   [facility] [place]     To contact the attending provider between 7A-7P or the covering provider during after hours 7P-7A, please log into the web site [email or web address] and access using universal [place] password for that web site. If you do not have the password, please call the hospital operator.  08/20/2022, 12:48 PM

## 2022-08-20 NOTE — Progress Notes (Signed)
Nutrition Follow-up  DOCUMENTATION CODES:   Severe malnutrition in context of acute illness/injury  INTERVENTION:  Continue tube feeding via Cortrak: Osmolite 1.5 at 10ml/hr ( per day) Provides 1980 kcal, 83 gm protein, 1006 ml free water daily   Continue assistance feeding with all meals  Add Boost Breeze po TID for pt to try, each supplement provides 250 kcal and 9 grams of protein  Can consider calorie count to better assess PO intake  NUTRITION DIAGNOSIS:   Severe Malnutrition related to acute illness as evidenced by mild fat depletion, moderate muscle depletion, energy intake < or equal to 50% for > or equal to 5 days. - remains applicable  GOAL:   Patient will meet greater than or equal to 90% of their needs - goal met via TF  MONITOR:   Diet advancement, Labs, Weight trends, I & O's, PO intake  REASON FOR ASSESSMENT:   Consult New TPN/TNA  ASSESSMENT:   72 y.o. female with PMH significant for HLD, chronic constipation who presented with complaints of abdominal pain, last BM 1.5 weeks ago, and loss of appetite. Admitted for colocolonic intussusception.  4/22 Admit 4/23 partial colectomy and end colostomy 5/1 Cortrak placed; tip gastric; TF started 5/4 ST adv diet to dysphagia 2, thin liquids   Noted possible plans for admission to CIR.   Pt sleeping at time of visit. No family present at bedside.   No documented meal completions on file to review. Spoke with RN. RN reports that pt is not consuming much. She refused breakfast and ate very little lunch.   Would recommend continuing with full rate TF until PO intake begins to improve. Pt is able to receive TF if transferred to CIR. Will also order Boost Breeze, as pt does not like milk, to optimize PO intake.   Weight history throughout admission has been widely variable. Fluctuations likely d/t transferring between units and volume overload requiring diuresis.   Medications: SSI 0-9 units q6h,  lactulose, protonix  Labs: CBG's 116-164 x24 hours  Diet Order:   Diet Order             DIET DYS 2 Room service appropriate? Yes; Fluid consistency: Thin  Diet effective now                   EDUCATION NEEDS:   Not appropriate for education at this time  Skin:  Skin Assessment: Skin Integrity Issues: Skin Integrity Issues:: Incisions Incisions: Medial abdomen  Last BM:  5/6 via colostomy  Height:   Ht Readings from Last 1 Encounters:  08/10/22 5' (1.524 m)    Weight:   Wt Readings from Last 1 Encounters:  08/20/22 55.7 kg   BMI:  Body mass index is 23.98 kg/m.  Estimated Nutritional Needs:   Kcal:  1800-2050 kcals  Protein:  70-85 grams  Fluid:  >/= 1.8L  Drusilla Kanner, RDN, LDN Clinical Nutrition

## 2022-08-21 DIAGNOSIS — R1310 Dysphagia, unspecified: Secondary | ICD-10-CM

## 2022-08-21 DIAGNOSIS — Z515 Encounter for palliative care: Secondary | ICD-10-CM | POA: Diagnosis not present

## 2022-08-21 DIAGNOSIS — E876 Hypokalemia: Secondary | ICD-10-CM | POA: Diagnosis not present

## 2022-08-21 DIAGNOSIS — K561 Intussusception: Secondary | ICD-10-CM | POA: Diagnosis not present

## 2022-08-21 DIAGNOSIS — Z7189 Other specified counseling: Secondary | ICD-10-CM | POA: Diagnosis not present

## 2022-08-21 LAB — GLUCOSE, CAPILLARY
Glucose-Capillary: 104 mg/dL — ABNORMAL HIGH (ref 70–99)
Glucose-Capillary: 105 mg/dL — ABNORMAL HIGH (ref 70–99)
Glucose-Capillary: 113 mg/dL — ABNORMAL HIGH (ref 70–99)
Glucose-Capillary: 127 mg/dL — ABNORMAL HIGH (ref 70–99)
Glucose-Capillary: 131 mg/dL — ABNORMAL HIGH (ref 70–99)
Glucose-Capillary: 133 mg/dL — ABNORMAL HIGH (ref 70–99)

## 2022-08-21 LAB — COMPREHENSIVE METABOLIC PANEL
ALT: 234 U/L — ABNORMAL HIGH (ref 0–44)
AST: 107 U/L — ABNORMAL HIGH (ref 15–41)
Albumin: 1.9 g/dL — ABNORMAL LOW (ref 3.5–5.0)
Alkaline Phosphatase: 183 U/L — ABNORMAL HIGH (ref 38–126)
Anion gap: 4 — ABNORMAL LOW (ref 5–15)
BUN: 9 mg/dL (ref 8–23)
CO2: 23 mmol/L (ref 22–32)
Calcium: 8.8 mg/dL — ABNORMAL LOW (ref 8.9–10.3)
Chloride: 110 mmol/L (ref 98–111)
Creatinine, Ser: 0.52 mg/dL (ref 0.44–1.00)
GFR, Estimated: >60 mL/min (ref >60)
Glucose, Bld: 119 mg/dL — ABNORMAL HIGH (ref 70–99)
Potassium: 4.3 mmol/L (ref 3.5–5.1)
Sodium: 137 mmol/L (ref 135–145)
Total Bilirubin: 11.6 mg/dL — ABNORMAL HIGH (ref 0.3–1.2)
Total Protein: 5.6 g/dL — ABNORMAL LOW (ref 6.5–8.1)

## 2022-08-21 LAB — CBC
HCT: 27.2 % — ABNORMAL LOW (ref 36.0–46.0)
Hemoglobin: 8.7 g/dL — ABNORMAL LOW (ref 12.0–15.0)
MCH: 30.3 pg (ref 26.0–34.0)
MCHC: 32 g/dL (ref 30.0–36.0)
MCV: 94.8 fL (ref 80.0–100.0)
Platelets: 141 10*3/uL — ABNORMAL LOW (ref 150–400)
RBC: 2.87 MIL/uL — ABNORMAL LOW (ref 3.87–5.11)
RDW: 21.6 % — ABNORMAL HIGH (ref 11.5–15.5)
WBC: 12.1 10*3/uL — ABNORMAL HIGH (ref 4.0–10.5)
nRBC: 0 % (ref 0.0–0.2)

## 2022-08-21 MED ORDER — MUSCLE RUB 10-15 % EX CREA
TOPICAL_CREAM | CUTANEOUS | Status: DC | PRN
  Filled 2022-08-21: qty 85

## 2022-08-21 NOTE — Progress Notes (Signed)
Physical Therapy Treatment Patient Details Name: Sarah Moore MRN: [id number] DOB: February 09, 1951 Today's Date: 08/21/2022   History of Present Illness 72 y.o. female presents to ED on 4/22 with complaints of abdominal pain. Dx of colonic intussuseption, s/p partial colectomy with end colostomy 08/07/22. Course complicated by confusion and hypotension starting 4/26, transfer to ICU, code stroke (negative), anemia, hyperbilirubinemia. PMH includes HLD, remote DVT, OA, GERD.    PT Comments    Pt received in supine and agreeable to session. Pt continuing to require increased time for initiation and response to commands, however is much improved from previous session. Pt able to perform all mobility this session with up to min guard for safety, however is limited by impaired activity tolerance. Pt requiring heavy encouragement to perform final stand trial from recliner due to fatigue. Pt continues to benefit from PT services to progress toward functional mobility goals.     Recommendations for follow up therapy are one component of a multi-disciplinary discharge planning process, led by the attending physician.  Recommendations may be updated based on patient status, additional functional criteria and insurance authorization.     Assistance Recommended at Discharge Frequent or constant Supervision/Assistance  Patient can return home with the following Two people to help with walking and/or transfers;Two people to help with bathing/dressing/bathroom   Equipment Recommendations  Rolling walker (2 wheels)    Recommendations for Other Services       Precautions / Restrictions Precautions Precautions: Fall Precaution Comments: colostomy, abdominal surgery, cortrak Restrictions Weight Bearing Restrictions: No     Mobility  Bed Mobility Overal bed mobility: Needs Assistance Bed Mobility: Supine to Sit     Supine to sit: Min guard     General bed mobility comments: increased time for  initiation and cues for sequencing    Transfers Overall transfer level: Needs assistance Equipment used: Rolling walker (2 wheels) Transfers: Sit to/from Stand, Bed to chair/wheelchair/BSC Sit to Stand: Min guard   Step pivot transfers: Min guard       General transfer comment: From EOB, BSC, and recliner with min guard for safety and slow power up. Pt able to step pivot with small, slow steps to [facility].    Ambulation/Gait Ambulation/Gait assistance: Min guard Gait Distance (Feet): 5 Feet Assistive device: Rolling walker (2 wheels) Gait Pattern/deviations: Step-through pattern, Decreased stride length, Shuffle, Narrow base of support, Trunk flexed Gait velocity: decr     General Gait Details: Pt demonstrating small, slow steps towards recliner requiring cues for upright posture. Pt becoming fatigued quickly.       Balance Overall balance assessment: Needs assistance Sitting-balance support: No upper extremity supported, Feet supported Sitting balance-Leahy Scale: Fair Sitting balance - Comments: sitting EOB   Standing balance support: During functional activity, Bilateral upper extremity supported, Reliant on assistive device for balance Standing balance-Leahy Scale: Fair Standing balance comment: with RW support                            Cognition Arousal/Alertness: Awake/alert Behavior During Therapy: Flat affect Overall Cognitive Status: Impaired/Different from baseline                                 General Comments: Improved response time to commands this session and more conversational        Exercises      General Comments        Pertinent  Vitals/Pain Pain Assessment Pain Assessment: Faces Faces Pain Scale: Hurts a little bit Pain Location: Nose Pain Descriptors / Indicators: Discomfort Pain Intervention(s): Monitored during session     PT Goals (current goals can now be found in the care plan section) Acute Rehab PT  Goals Patient Stated Goal: get stronger, then go home PT Goal Formulation: With patient Time For Goal Achievement: 08/31/22 Potential to Achieve Goals: Good Progress towards PT goals: Progressing toward goals    Frequency    Min 4X/week      PT Plan Current plan remains appropriate       AM-PAC PT "6 Clicks" Mobility   Outcome Measure  Help needed turning from your back to your side while in a flat bed without using bedrails?: A Little Help needed moving from lying on your back to sitting on the side of a flat bed without using bedrails?: A Little Help needed moving to and from a bed to a chair (including a wheelchair)?: A Little Help needed standing up from a chair using your arms (e.g., wheelchair or bedside chair)?: A Little Help needed to walk in hospital room?: A Little Help needed climbing 3-5 steps with a railing? : A Lot 6 Click Score: 17    End of Session Equipment Utilized During Treatment: Gait belt Activity Tolerance: Patient limited by fatigue Patient left: in chair;with chair alarm set;with call bell/phone within reach Nurse Communication: Mobility status PT Visit Diagnosis: Difficulty in walking, not elsewhere classified (R26.2);Pain     Time: 1610-9604 PT Time Calculation (min) (ACUTE ONLY): 31 min  Charges:  $Gait Training: 8-22 mins $Therapeutic Activity: 8-22 mins                     Johny Shock, PTA Acute Rehabilitation Services Secure Chat Preferred  [phone]    Johny Shock 08/21/2022, 2:26 PM

## 2022-08-21 NOTE — Plan of Care (Signed)
  Problem: Health Behavior/Discharge Planning: Goal: Ability to manage health-related needs will improve Outcome: Progressing   

## 2022-08-21 NOTE — Progress Notes (Signed)
Inpatient Rehab Admissions Coordinator:    I continue to await insurance auth for CIR. I will follow for potential admit pending auth.   Milaya Hora, MS, CCC-SLP Rehab Admissions Coordinator  [phone] (celll) [phone] (office)  

## 2022-08-21 NOTE — Consult Note (Signed)
WOC Nurse ostomy follow up Stoma type/location: LUQ end colostomy  Stomal assessment/size: 7/8", slightly oval, slightly above skin level at today's visit, red and moist; has a 1 cm x 1.5 cms area of mucocutaneous separation as previously noted from 9-10 o'clock Peristomal assessment: patient with dip in skin at 9 o'clock, midline incision to right of ostomy with staples intact, mild erythema noted but skin intact  Treatment options for stomal/peristomal skin: 2" skin barrier ring cut in half and placed in crease at 9 o'clock (this is where stool tends to leak into wound) Output approximately 100 mls liquid brown green stool  Ostomy pouching: 1pc. Convex [place] [phone])  Education provided: patient had one teaching session before becoming critically ill last week and does not remember anything from teaching session.  Today we reviewed emptying pouch. Patient able to utilize lock and roll mechanism to open and close pouch.  Did demonstrate to patient emptying pouch and cleaning with toilet paper wick.  Did discuss with patient that pouch will need to be emptied when 1/3 to 1/2 full.  Talked to patient about measuring stoma at this visit, stoma not as retracted as when last assessed.  Did measure at 7/8" slightly oval.  Demonstrated to patient how to cut new skin barrier.  We discussed use of 2" barrier ring in her case to fill in crease at 9 o'clock. Barrier ring placed and new convex pouch attached. Patient again practiced opening and closing pouch.   This RN cleaned wound, removed staples and placed steri-strips after verifying order.  Ostomy is very close to incision and unfortunately dip at 9 o'clock runs into incision line.  Patient states she has 3 sons and 1 daughter.  We discussed the potential of her going to inpatient rehab and that we would like to set up teaching sessions with one of her children. Patient is not sure who she will be staying with when she is ultimately discharged home.  I did  tell patient that we will continue to follow her for ostomy teaching if she goes to inpatient rehab, which may be as soon as today per MD.    Ordered (5)  1 pc Convex pouches [place] [phone]) and (5) 2" barrier rings [phone]). Patient has ostomy belt in room that may be beneficial when she is up and moving around.  Enrolled patient in [place] Secure Start Discharge program: Yes  WOC team will continue to follow patient for ostomy education and support.   Thank you,    Priscella Mann MSN, RN-BC, [employer] [phone]

## 2022-08-21 NOTE — Progress Notes (Addendum)
PROGRESS NOTE   ARII GALLEGOS  ZOX:[id number]    DOB: [birth year]    DOA: 08/06/2022  PCP: Norm Salt, PA   I have briefly reviewed patients previous medical records in [facility].  Chief Complaint  Patient presents with   Constipation    Brief Narrative:  72 year old female with medical history significant for remote DVT, not on anticoagulation, HLD, chronic constipation presented to the ED on 08/06/2022 with complaints of abdominal pain, constipation and decreased appetite.  She had been seen in the ED 2 days prior and treated with Linzess and Fleet enema.  CT abdomen/pelvis showed colocolonic intussusception at the junction of descending and sigmoid colon felt due to intraluminal lipoma measuring 6.4 x 4.5 cm.  This General surgery consulted and underwent partial colectomy and end colostomy on 4/23.  Was complicated by confusion, hypotension, hypoglycemia, strokelike symptoms, acute hepatic/metabolic encephalopathy and anemia.  Briefly required vasopressors.  Stroke workup negative.  Hemoglobin of 4.9 felt to be a lab error.  Stabilized and care transferred from ICU/CCM to [facility] and floor on 08/19/2022.  Apart from continued core track feeds due to poor oral intake, is medically optimized for DC to CIR pending insurance authorization.  Core track feeding can be managed at CIR.  08/21/2022, 5:15 PM.  I did a peer to peer review with Dr. Charleston Ropes at [facility], discussed patient's case in detail, strongly advocated for CIR but Dr. Madelin Rear indicated that she did not feel patient was appropriate for CIR, probably most appropriate for SNF and hence she upheld the insurance denial.  Please refer to PCCM transfer note/progress note from 08/18/2022 for details prior to that date.   Assessment & Plan:  Principal Problem:   Colonic intussusception (HCC) Active Problems:   DVT, HX OF   Hypokalemia   HLD (hyperlipidemia)   Septic shock (HCC)   Acute metabolic encephalopathy due to  hypoglycemia   Lactic acidosis   Cerebrovascular accident (CVA) (HCC)   Protein-calorie malnutrition, severe   Obstructive jaundice   Hyperbilirubinemia   ABLA (acute blood loss anemia)   Hemorrhagic shock (HCC)   Acute metabolic encephalopathy   Acute hepatic and metabolic encephalopathy -Extensive neurologic workup done thus far including negative, head CT, MRI, and LTM.  Ammonia normalized. - Continue delirium precautions, and bed mobility, holding sedating meds.  - Promote appropriate day night cycles. - Continue lactulose 3 times daily -Improved or even resolved.  Will need update from family regarding her current mental status compared to her baseline.   Acute blood loss anemia, anemia of critical illness -Concern for GI process, now off pressors and lactic has cleared as of 5/1 - Transfuse for hemoglobin less than 7 or hemodynamically significant bleeding - Significant drop from 7.2 to 4.9 as of 5/1. No signs of external or radiographic signs of bleeding. Patient also remains hemodynamically stable.  This hemoglobin drop/4.9 was felt to be a lab error. -Stable in the 8 g range for the last 3 days.   Colonic intussusception now s/p sigmoid colectomy w/ colostomy 4/23 -General surgery continues to follow and management per them -Surgical pathology benign (pathology report below). -Colostomy working well.  On dysphagia 2 diet but not taking much p.o. and hence continuing on tube feeds via core track.  When able to tolerate p.o. adequately, DC tube feeds and core track.  Encouraged patient to take in p.o. and requested RN to document meal intake and flow charts. -Ostomy management per [facility] RN team. -Only took 25% of  her meal yesterday.  Encouraged her to increase oral intake this morning.  Since then documentation of 50% at breakfast and 75% at lunch.  If this remains consistent, may be able to DC core track with feeds in AM.   Elevated LFTs secondary to shock liver vs obstructive  pathology  Hyperbilirubinemia/jaundice -Mildly distended gallbladder on CT, sludge in gallbladder on [place] 4/28, can not rule out obstructing stone at this time  -As per surgical follow-up, low suspicion for cholecystitis and no role for HIDA. -GI saw and signed off 5/1/Dr. Loreta Ave -Continue lactulose 3 times daily.  Avoid phytotoxins. -LFTs continue to steadily improve (total bilirubin down from peak of 28.7 to 11.6) and can be followed up at CIR. icterus also improving.   Anasarca with global volume overload  Hypoalbuminemia   -Had positive fluid balance in ICU.  Diuresed with albumin. -Discontinued Foley catheter 5/5. -Volume status has improved, likely euvolemic.  Completed IV Lasix 5/5.   Possible GI bleed  -Patient with large BM in ostomy on 4/28 with dark tarry appearing stool.  No further blood noted in ostomy since.  - Trial resuming chemical DVT prophylaxis, tolerating. - Continue high-dose PPI   Hypokalemia  - Monitor and replace as needed.   Hyperglycemia -SSI as needed - Well-controlled CBGs.  A1c 5.5.  Can DC SSI when tube feeds are stopped.   Dysphagia -Advance diet per SLP, continue core track feedings.  Currently on dysphagia 2 diet and thin liquids.  When tolerating PO well, can DC tube feeds and core track.  See discussion above.   Deconditioning -Aggressive PT, OT, SLP - Out of bed to chair today daily each shift. -Patient is medically optimized for DC to CIR.  Awaiting insurance authorization.   AAA, suprarenal- incidental finding on MRI -needs follow up US in 3 years    Body mass index is 23.25 kg/m.  Nutritional Status Nutrition Problem: Severe Malnutrition Etiology: acute illness Signs/Symptoms: mild fat depletion, moderate muscle depletion, energy intake < or equal to 50% for > or equal to 5 days Interventions: Boost Breeze, Tube feeding   ACP Documents: None present DVT prophylaxis: heparin injection 5,000 Units Start: 08/18/22 0900 SCDs Start:  08/06/22 1950     Code Status: DNR:  Family Communication: Discussed in detail with patient's son via phone 5/5, updated care and answered all questions Disposition:  Can be discharged to [facility] pending insurance authorization.     Consultants:   General surgery PCCM GI/Dr. Loreta Ave Neurology Palliative care medicine  Procedures:   As noted above  Antimicrobials:   Completed course of antibiotics on 5/2.   Subjective:  Seen this morning along with WOC RN in room at bedside.  Patient denies complaints.  States that she feels "okay".  Agrees to trying to increase oral intake and advised her that we would be able to remove the NG tube if her oral intake consistently improves.  Appears to verbalized understanding.  Objective:   Vitals:   08/20/22 2114 08/21/22 0447 08/21/22 0454 08/21/22 0831  BP: 122/71 125/80  114/71  Pulse: 84 90  92  Resp: 17 17  18   Temp: 98 F (36.7 C) 97.8 F (36.6 C)  98.4 F (36.9 C)  TempSrc: Oral Oral  Oral  SpO2: 100% 100%  100%  Weight:   54 kg   Height:        General exam: Elderly female, moderately built and frail sitting up comfortably in bed without distress.  Oral mucosa moist.  Left  nare core track in place with tube feeds ongoing.  Significant scleral and skin icterus present, improving. Respiratory system: Clear to auscultation.  No increased work of breathing. Cardiovascular system: S1 & S2 heard, RRR. No JVD, murmurs, rubs, gallops or clicks.  Trace bilateral ankle edema.  Not on telemetry. Gastrointestinal system: Abdomen is nondistended, soft and nontender. No organomegaly or masses felt. Normal bowel sounds heard.  Midline laparotomy staples intact and no acute findings.  Colostomy functioning well.  Bowel sounds heard.  Stable without acute changes. Central nervous system: Alert and oriented x 3. No focal neurological deficits.  No asterixis appreciated. Extremities: Symmetric 5 x 5 power. Skin: No rashes, lesions or  ulcers Psychiatry: Judgement and insight appear normal. Mood & affect appropriate.     Data Reviewed:   I have personally reviewed following labs and imaging studies   CBC: Recent Labs  Lab 08/19/22 0259 08/20/22 0655 08/21/22 0112  WBC 11.9* 14.7* 12.1*  HGB 8.9* 8.9* 8.7*  HCT 25.6* 27.2* 27.2*  MCV 89.5 92.5 94.8  PLT 181 163 141*    Basic Metabolic Panel: Recent Labs  Lab 08/15/22 0709 08/15/22 1355 08/16/22 0321 08/17/22 0319 08/18/22 0314 08/19/22 0259 08/20/22 0655 08/21/22 0112  NA 144  --  143 142 142 139 138 137  K 3.4*  --  3.5 3.7 3.3* 3.6 4.1 4.3  CL 113*  --  110 108 111 107 106 110  CO2 28  --  28 25 26 27 26 23   GLUCOSE 137*  --  161* 134* 145* 140* 136* 119*  BUN 21  --  19 20 17 12 12 9   CREATININE 0.79  --  0.81 0.73 0.65 0.64 0.63 0.52  CALCIUM 8.2*  --  8.6* 8.9 8.6* 8.4* 8.7* 8.8*  MG 1.8 1.8 1.7 2.1 2.0  --  1.9  --   PHOS 3.2 3.8 3.9 4.5  --   --  3.0  --     Liver Function Tests: Recent Labs  Lab 08/17/22 0319 08/18/22 0314 08/19/22 0259 08/20/22 0655 08/21/22 0112  AST 152* 135* 94* 96* 107*  ALT 622* 496* 327* 269* 234*  ALKPHOS 216* 227* 186* 172* 183*  BILITOT 24.6* 20.1* 16.7* 13.1* 11.6*  PROT 4.8* 5.0* 5.4* 5.7* 5.6*  ALBUMIN <1.5* <1.5* 2.3* 2.0* 1.9*    CBG: Recent Labs  Lab 08/21/22 0447 08/21/22 0828 08/21/22 1203  GLUCAP 104* 127* 131*    Microbiology Studies:   No results found for this or any previous visit (from the past 240 hour(s)).   Radiology Studies:  No results found.  Scheduled Meds:    Chlorhexidine Gluconate Cloth  6 each Topical Daily   feeding supplement  1 Container Oral TID BM   heparin injection (subcutaneous)  5,000 Units Subcutaneous Q8H   insulin aspart  0-9 Units Subcutaneous Q6H   lactulose  20 g Per Tube TID   mouth rinse  15 mL Mouth Rinse 4 times per day   pantoprazole (PROTONIX) IV  40 mg Intravenous Q12H    Continuous Infusions:    sodium chloride 5 mL/hr at  08/18/22 1200   feeding supplement (OSMOLITE 1.5 CAL) 1,000 mL (08/20/22 1626)     LOS: 15 days     Marcellus Scott, MD,  FACP, FHM, SFHM, [facility], [facility]   [facility] Physician Advisor [employer]     To contact the attending provider between 7A-7P or the covering provider during after hours 7P-7A, please log into the  web site [email or web address] and access using universal [place] password for that web site. If you do not have the password, please call the hospital operator.  08/21/2022, 3:55 PM

## 2022-08-21 NOTE — Progress Notes (Signed)
   Palliative Medicine Inpatient Follow Up Note   HPI: Sarah Moore is a 72 y.o. female with past medical history significant for hyperlipidemia, chronic constipation, history of remote DVT not on anticoagulation who presented to  on 4/22 with complaints of abdominal pain. CT abdomen/pelvis with colocolonic intussusception at the junction of the descending and sigmoid colon likely due to a large intraluminal lipoma serving as a lead point, mild mural thickening of the into the septum without frank evidence of bowel ischemia concerning for partial functional colonic obstruction with moderate gaseous distention - s/p surgical intervention --> sigmoid colectomy. Has had a prolonged hospitalization complicated by shock liver and delirium. Palliative care has been asked to get involved to assist in additional goals of care conversations.    Today's Discussion 08/21/2022  *Please note that this is a verbal dictation therefore any spelling or grammatical errors are due to the "Dragon Medical One" system interpretation.  Chart reviewed inclusive of vital Moore, progress notes, laboratory results, and diagnostic images. Continue on enteral nutrition.   I met with Sarah Moore this afternoon in the presence of her son, Sarah Moore. She shares that her appetite is increasing tremendously. She feels that eating will further help her to gain strength which I agreed with. She and her son discussed options for discharge post rehabilitation though it appears that as a family they are going to need to work on [employer].   Per discussion with Sarah Moore's RN, she has been doing well but does have some generalized musculoskeletal pain. Discussed some analgesic balm for pain relief.   I called patients daughter, Sarah Moore and provided her an update.   Plan for acute rehabilitation this week once insurance approval is obtained.   Questions and concerns addressed/Palliative Support Provided.   Objective Assessment: Vital  Moore Vitals:   08/21/22 0447 08/21/22 0831  BP: 125/80 114/71  Pulse: 90 92  Resp: 17 18  Temp: 97.8 F (36.6 C) 98.4 F (36.9 C)  SpO2: 100% 100%    Intake/Output Summary (Last 24 hours) at 08/21/2022 1310 Last data filed at 08/21/2022 1100 Gross per 24 hour  Intake 1371 ml  Output 600 ml  Net 771 ml    Last Weight  Most recent update: 08/21/2022  6:34 AM    Weight  54 kg (119 lb 0.8 oz)            Gen: Elderly African-American female chronically ill in appearance HEENT: Core track in place, dry mucous membranes CV: Regular rate and rhythm  PULM: On room air breathing is even and nonlabored ABD: colostomy EXT: No edema Neuro: Alert to self and place  SUMMARY OF RECOMMENDATIONS   DNAR/DNI  Musculoskeletal Pain:  - Analgesic Balm   Continue present efforts to improve condition  Plan for acute rehabilitation once optimized - can transition there with Coretrack    Incremental palliative care support pls call service is support is needed sooner  Billing based on MDM: Moderate ______________________________________________________________________________________ Sarah Moore Sarah Moore Palliative Medicine Team Team Cell Phone: [phone] Please utilize secure chat with additional questions, if there is no response within 30 minutes please call the above phone number  Palliative Medicine Team providers are available by phone from 7am to 7pm daily and can be reached through the team cell phone.  Should this patient require assistance outside of these hours, please call the patient's attending physician.

## 2022-08-22 DIAGNOSIS — K561 Intussusception: Secondary | ICD-10-CM | POA: Diagnosis not present

## 2022-08-22 LAB — CBC
HCT: 27.5 % — ABNORMAL LOW (ref 36.0–46.0)
Hemoglobin: 8.9 g/dL — ABNORMAL LOW (ref 12.0–15.0)
MCH: 30.7 pg (ref 26.0–34.0)
MCHC: 32.4 g/dL (ref 30.0–36.0)
MCV: 94.8 fL (ref 80.0–100.0)
Platelets: 146 10*3/uL — ABNORMAL LOW (ref 150–400)
RBC: 2.9 MIL/uL — ABNORMAL LOW (ref 3.87–5.11)
RDW: 22.2 % — ABNORMAL HIGH (ref 11.5–15.5)
WBC: 9.8 10*3/uL (ref 4.0–10.5)
nRBC: 0 % (ref 0.0–0.2)

## 2022-08-22 LAB — GLUCOSE, CAPILLARY
Glucose-Capillary: 117 mg/dL — ABNORMAL HIGH (ref 70–99)
Glucose-Capillary: 118 mg/dL — ABNORMAL HIGH (ref 70–99)
Glucose-Capillary: 127 mg/dL — ABNORMAL HIGH (ref 70–99)
Glucose-Capillary: 131 mg/dL — ABNORMAL HIGH (ref 70–99)
Glucose-Capillary: 97 mg/dL (ref 70–99)
Glucose-Capillary: 97 mg/dL (ref 70–99)

## 2022-08-22 NOTE — Progress Notes (Signed)
PROGRESS NOTE   Sarah Moore  ZOX:[id number] DOB: [birth year] DOA: 08/06/2022 PCP: Norm Salt, PA  Brief Narrative:  72 year old female Remote DVT not on anticoagulation, HLD chronic constipation/anal fissure status post flex sig 4/9 follows with Dr. Daleen Squibb to take lactulose Admit ABD pain/20 05/2022, no stool X1 week no nausea no vomiting--- CT ABD = colocolonic intussusception at descending and sigmoid colon 2/2 large intraluminal lipoma?  6.4 X4.5 4/23 had partial colectomy and end-colostomy 4/26 hypotensive systolic in 70s bolused IVF still hypotensive dropping hemoglobin WBC 13-profoundly hypoglycemic CBG 17-ICU transfer-brief vasopressor requirement 4/29 neurology consulted 2/2 new right facial droop left lower extremity weakness and echolalia-CT angiogram unremarkable MRI brain unremarkable 4/30 long-term EEG at [facility] showed acute toxic metabolic encephalopathy and epileptologist did not feel patient needed AEDs 5/1 hemoglobin 4.9---2 units PRBC, core track placed 5/2 ultrasound showed no liver lesions thrombi or biliary dilatation and small amount of gallbladder sludge, MRCP without biliary obstruction?  Cholecystitis-General surgery felt no role for HIDA scan   Hospital-Problem based course  Toxic metabolic encephalopathy secondary to hepatic encephalopathy Secondary to potentially delirium and hypoglycemia but had elevated ammonia on admission Promote circadian rhythms Lifelong lactulose 20 3 times daily given hepatic component to her encephalopathy  Colocolonic intussusception + sigmoid colectomy/ostomy 4/23 Appreciative WOC nurse input and ostomy management Tube feeds to be weaned and core track attempt at discontinuation if eating >60% of meals Have reinforced with nursing to kindly document percentage of meals Appreciative SLP input now on regular diet  Shock liver hyperbilirubinemia CT and ultrasound?  Sludge in gallbladder but general surgery feels low  suspicion for cholecystitis no role for HIDA scan Bilirubin to be followed daily Icterus improving some  Unfounded anemia?  GI bleed  Large BM with dark appearing stool 4/28 Hemoglobin was spuriously low but seems to have stabilized in the 8 range although was transfused while in the ICU on 5/1  Hypokalemia Now resolved and stabilized Periodic rechecks  Dysphagia Improving to some degree now tolerating hopefully full diet  Impaired glucose tolerance and A1c 5.5 Stop CBG checks  Hepatic hemangioma 4/28 with known hepatic steatosis Outpatient follow-up and management  AAA incidental MRI finding needs ultrasound in 3 years  DVT prophylaxis: Heparin Code Status: DNR Family Communication: Discussed with family on phone who will update rest of family Disposition:  Status is: Inpatient      Subjective: Awake coherent has NG tube in place She does not seem to be in pain She seems interested in eating but is not eating as much as needed yet No chest pain   Objective: Vitals:   08/21/22 1629 08/21/22 2003 08/22/22 0329 08/22/22 0500  BP: 121/66 110/62 106/66   Pulse: 88 92 86   Resp: 16 18    Temp: 98.8 F (37.1 C) 99.1 F (37.3 C) 98.9 F (37.2 C)   TempSrc: Oral Oral Oral   SpO2: 100% 100% 100%   Weight:    54 kg  Height:        Intake/Output Summary (Last 24 hours) at 08/22/2022 0710 Last data filed at 08/22/2022 0521 Gross per 24 hour  Intake 1211 ml  Output 550 ml  Net 661 ml   Filed Weights   08/20/22 0500 08/21/22 0454 08/22/22 0500  Weight: 55.7 kg 54 kg 54 kg    Examination:  EOMI NCAT no focal deficit-does have core track in place Chest clear no added sound wheeze rales rhonchi ROM intact CTAB Abdomen soft no rebound no  guarding No flap Moves 4 limbs equally-rest of neuroexam was deferred  Data Reviewed: personally reviewed   CBC    Component Value Date/Time   WBC 9.8 08/22/2022 0008   RBC 2.90 (L) 08/22/2022 0008   HGB 8.9 (L)  08/22/2022 0008   HGB 11.9 10/11/2008 1245   HCT 27.5 (L) 08/22/2022 0008   HCT 34.2 (L) 10/11/2008 1245   PLT 146 (L) 08/22/2022 0008   PLT 255 10/11/2008 1245   MCV 94.8 08/22/2022 0008   MCV 86 10/11/2008 1245   MCH 30.7 08/22/2022 0008   MCHC 32.4 08/22/2022 0008   RDW 22.2 (H) 08/22/2022 0008   RDW 12.1 10/11/2008 1245   LYMPHSABS 2.6 08/06/2022 1230   LYMPHSABS 2.1 10/11/2008 1245   MONOABS 0.4 08/06/2022 1230   EOSABS 0.1 08/06/2022 1230   EOSABS 0.1 10/11/2008 1245   BASOSABS 0.0 08/06/2022 1230   BASOSABS 0.0 10/11/2008 1245      Latest Ref Rng & Units 08/21/2022    1:12 AM 08/20/2022    6:55 AM 08/19/2022    2:59 AM  CMP  Glucose 70 - 99 mg/dL 725  366  440   BUN 8 - 23 mg/dL 9  12  12    Creatinine 0.44 - 1.00 mg/dL 3.47  4.25  9.56   Sodium 135 - 145 mmol/L 137  138  139   Potassium 3.5 - 5.1 mmol/L 4.3  4.1  3.6   Chloride 98 - 111 mmol/L 110  106  107   CO2 22 - 32 mmol/L 23  26  27    Calcium 8.9 - 10.3 mg/dL 8.8  8.7  8.4   Total Protein 6.5 - 8.1 g/dL 5.6  5.7  5.4   Total Bilirubin 0.3 - 1.2 mg/dL 38.7  56.4  33.2   Alkaline Phos 38 - 126 U/L 183  172  186   AST 15 - 41 U/L 107  96  94   ALT 0 - 44 U/L 234  269  327      Radiology Studies: No results found.   Scheduled Meds:  Chlorhexidine Gluconate Cloth  6 each Topical Daily   feeding supplement  1 Container Oral TID BM   heparin injection (subcutaneous)  5,000 Units Subcutaneous Q8H   insulin aspart  0-9 Units Subcutaneous Q6H   lactulose  20 g Per Tube TID   mouth rinse  15 mL Mouth Rinse 4 times per day   pantoprazole (PROTONIX) IV  40 mg Intravenous Q12H   Continuous Infusions:  sodium chloride 5 mL/hr at 08/18/22 1200   feeding supplement (OSMOLITE 1.5 CAL) 1,000 mL (08/20/22 1626)     LOS: 16 days   Time spent: 44  Rhetta Mura, MD [facility] To contact the attending provider between 7A-7P or the covering provider during after hours 7P-7A, please log into the web site  [email or web address] and access using universal  password for that web site. If you do not have the password, please call the hospital operator.  08/22/2022, 7:10 AM

## 2022-08-22 NOTE — Progress Notes (Signed)
OT Cancellation Note  Patient Details Name: AAMORI NICKLIN MRN: [id number] DOB: [birth year]   Cancelled Treatment:    Reason Eval/Treat Not Completed: Patient declined, no reason specified. Pt reporting she does not feel well today. Asking OT x2 to return tomorrow. Will return as schedule allows.   Tyler Deis, OTR/L [facility] Acute Rehabilitation Office: [phone]   Myrla Halsted 08/22/2022, 5:33 PM

## 2022-08-22 NOTE — Progress Notes (Signed)
Inpatient Rehab Admissions Coordinator:    I met with pt. And spoke with son and daughter over the phone to let them know that CIR case was denied by insurance. Ultimately, they decided not to file an appeal and to pursue SNF. CIR will sign off.   Megan Salon, MS, CCC-SLP Rehab Admissions Coordinator  [phone] (celll) [phone] (office)

## 2022-08-22 NOTE — Progress Notes (Signed)
Speech Language Pathology Treatment: Dysphagia  Patient Details Name: Sarah Moore MRN: [id number] DOB: 22-Sep-1950 Today's Date: 08/22/2022 Time: 2536-6440 SLP Time Calculation (min) (ACUTE ONLY): 14 min  Assessment / Plan / Recommendation Clinical Impression  Pt was seen for dysphagia treatment. Pt and her RN reported that the pt has been tolerating the current diet without signs of aspiration, and SLP was advised by staff that pt consumed pancakes yesterday and wanted chicken salad. Pt tolerated dual consistency boluses (i.e., pears in juice), regular texture solids, and thin liquids via cup and straw without overt s/s of aspiration. Mastication and oral clearance were WFL and no symptoms of pharyngeal residue were noted. A regular texture diet with thin liquids is recommended and SLP will follow briefly to ensure tolerance of the advanced diet.    HPI HPI: Patient is a 72 y.o. female with PMH: remote DVT not on anticoagulation, HLD, chronic constipation, GERD. She presented to the hospital on 08/06/22 with c/o abdominal pain and constipation. In ED, CT abd/pelvis revealed colo-colic intussusception beginning at the junction of the descending and sigmoid colon. She undewent exploratory laparotomy, sigmoid colectomy with colostomy on 08/07/22. SLP swallow evaluation ordered with surgery recommending to not advance diet beyond clears. Pt cleared for solids by surgery on 5/4; a dysphagia 2 diet with thin liquids recommended by SLP; per surgery's notes on 5/6, . Can advance diet/wean TF as tolerated with improved PO intake.      SLP Plan  Continue with current plan of care      Recommendations for follow up therapy are one component of a multi-disciplinary discharge planning process, led by the attending physician.  Recommendations may be updated based on patient status, additional functional criteria and insurance authorization.    Recommendations  Diet recommendations: Regular;Thin  liquid Liquids provided via: Cup;Straw Medication Administration: Whole meds with liquid Supervision: Full supervision/cueing for compensatory strategies;Trained caregiver to feed patient;Staff to assist with self feeding Postural Changes and/or Swallow Maneuvers: Seated upright 90 degrees;Upright 30-60 min after meal                  Oral care BID     Dysphagia, unspecified (R13.10)     Continue with current plan of care   Miaya Lafontant I. Vear Clock, MS, CCC-SLP Neuro Diagnostic Specialist  Acute Rehabilitation Services Office number: [phone]  Scheryl Marten  08/22/2022, 10:49 AM

## 2022-08-22 NOTE — Progress Notes (Signed)
Physical Therapy Treatment Patient Details Name: Sarah Moore MRN: [id number] DOB: 1951/02/21 Today's Date: 08/22/2022   History of Present Illness 72 y.o. female presents to ED on 4/22 with complaints of abdominal pain. Dx of colonic intussuseption, s/p partial colectomy with end colostomy 08/07/22. Course complicated by confusion and hypotension starting 4/26, transfer to ICU, code stroke (negative), anemia, hyperbilirubinemia. PMH includes HLD, remote DVT, OA, GERD.    PT Comments    Pt received in supine and agreeable to session with encouragement. Pt limited by impaired activity tolerance, requiring frequent rest breaks throughout session. Pt able to perform bed mobility with dense cues for initiation and up to min A. Pt demonstrating slow movement, but no unsteadiness with RW support during standing and pivoting to/from [facility]. Pt able to tolerate some exercises sitting EOB, then requesting to return to supine. Pt agreeable to putting the bed in chair position, however pt reporting that she will not be able to keep the [facility] elevated very long. Pt was instructed in how to use the bed remote to adjust the [facility] when needed, but encouraged to keep it elevated as long as she can tolerate. Pt continues to benefit from PT services to progress toward functional mobility goals.    Recommendations for follow up therapy are one component of a multi-disciplinary discharge planning process, led by the attending physician.  Recommendations may be updated based on patient status, additional functional criteria and insurance authorization.     Assistance Recommended at Discharge Frequent or constant Supervision/Assistance  Patient can return home with the following Two people to help with walking and/or transfers;Two people to help with bathing/dressing/bathroom   Equipment Recommendations  Rolling walker (2 wheels)    Recommendations for Other Services       Precautions / Restrictions  Precautions Precautions: Fall Precaution Comments: colostomy, abdominal surgery, cortrak Restrictions Weight Bearing Restrictions: No     Mobility  Bed Mobility Overal bed mobility: Needs Assistance Bed Mobility: Supine to Sit, Sit to Supine     Supine to sit: Min guard Sit to supine: Min assist   General bed mobility comments: increased time for initiation and min A for RLE elevation to EOB    Transfers Overall transfer level: Needs assistance Equipment used: Rolling walker (2 wheels) Transfers: Sit to/from Stand, Bed to chair/wheelchair/BSC Sit to Stand: Min guard   Step pivot transfers: Min guard       General transfer comment: From EOB and BSC with min guard for safety and slow power up. Pt able to step pivot with small, slow steps to/from [facility]. Cues for hand placement    Ambulation/Gait               General Gait Details: Pt deferred due to fatigue      Balance Overall balance assessment: Needs assistance Sitting-balance support: No upper extremity supported, Feet supported Sitting balance-Leahy Scale: Fair Sitting balance - Comments: sitting EOB   Standing balance support: During functional activity, Bilateral upper extremity supported, Reliant on assistive device for balance Standing balance-Leahy Scale: Fair Standing balance comment: with RW support                            Cognition Arousal/Alertness: Awake/alert Behavior During Therapy: Flat affect Overall Cognitive Status: Impaired/Different from baseline  General Comments: Continues to require increased time for initiation and responses        Exercises General Exercises - Lower Extremity Long Arc Quad: AROM, Seated, Both, 10 reps Hip Flexion/Marching: AROM, Seated, Both, 5 reps    General Comments        Pertinent Vitals/Pain Pain Assessment Pain Assessment: Faces Faces Pain Scale: No hurt     PT Goals (current goals  can now be found in the care plan section) Acute Rehab PT Goals Patient Stated Goal: get stronger, then go home PT Goal Formulation: With patient Time For Goal Achievement: 08/31/22 Potential to Achieve Goals: Good Progress towards PT goals: Progressing toward goals    Frequency    Min 4X/week      PT Plan Current plan remains appropriate       AM-PAC PT "6 Clicks" Mobility   Outcome Measure  Help needed turning from your back to your side while in a flat bed without using bedrails?: A Little Help needed moving from lying on your back to sitting on the side of a flat bed without using bedrails?: A Little Help needed moving to and from a bed to a chair (including a wheelchair)?: A Little Help needed standing up from a chair using your arms (e.g., wheelchair or bedside chair)?: A Little Help needed to walk in hospital room?: A Little Help needed climbing 3-5 steps with a railing? : A Lot 6 Click Score: 17    End of Session Equipment Utilized During Treatment: Gait belt Activity Tolerance: Patient limited by fatigue Patient left: in bed;with call bell/phone within reach Nurse Communication: Mobility status PT Visit Diagnosis: Difficulty in walking, not elsewhere classified (R26.2);Pain     Time: 1610-9604 PT Time Calculation (min) (ACUTE ONLY): 29 min  Charges:  $Therapeutic Activity: 23-37 mins                     Johny Shock, PTA Acute Rehabilitation Services Secure Chat Preferred  [phone]    Johny Shock 08/22/2022, 10:30 AM

## 2022-08-23 DIAGNOSIS — K561 Intussusception: Secondary | ICD-10-CM | POA: Diagnosis not present

## 2022-08-23 LAB — COMPREHENSIVE METABOLIC PANEL
ALT: 191 U/L — ABNORMAL HIGH (ref 0–44)
AST: 136 U/L — ABNORMAL HIGH (ref 15–41)
Albumin: 1.9 g/dL — ABNORMAL LOW (ref 3.5–5.0)
Alkaline Phosphatase: 228 U/L — ABNORMAL HIGH (ref 38–126)
Anion gap: 6 (ref 5–15)
BUN: 13 mg/dL (ref 8–23)
CO2: 23 mmol/L (ref 22–32)
Calcium: 9.1 mg/dL (ref 8.9–10.3)
Chloride: 109 mmol/L (ref 98–111)
Creatinine, Ser: 0.5 mg/dL (ref 0.44–1.00)
GFR, Estimated: >60 mL/min (ref >60)
Glucose, Bld: 127 mg/dL — ABNORMAL HIGH (ref 70–99)
Potassium: 4.5 mmol/L (ref 3.5–5.1)
Sodium: 138 mmol/L (ref 135–145)
Total Bilirubin: 9.2 mg/dL — ABNORMAL HIGH (ref 0.3–1.2)
Total Protein: 6 g/dL — ABNORMAL LOW (ref 6.5–8.1)

## 2022-08-23 LAB — CBC
HCT: 28.1 % — ABNORMAL LOW (ref 36.0–46.0)
Hemoglobin: 9.1 g/dL — ABNORMAL LOW (ref 12.0–15.0)
MCH: 31.1 pg (ref 26.0–34.0)
MCHC: 32.4 g/dL (ref 30.0–36.0)
MCV: 95.9 fL (ref 80.0–100.0)
Platelets: 153 10*3/uL (ref 150–400)
RBC: 2.93 MIL/uL — ABNORMAL LOW (ref 3.87–5.11)
RDW: 23.1 % — ABNORMAL HIGH (ref 11.5–15.5)
WBC: 7.6 10*3/uL (ref 4.0–10.5)
nRBC: 0 % (ref 0.0–0.2)

## 2022-08-23 LAB — GLUCOSE, CAPILLARY
Glucose-Capillary: 120 mg/dL — ABNORMAL HIGH (ref 70–99)
Glucose-Capillary: 123 mg/dL — ABNORMAL HIGH (ref 70–99)
Glucose-Capillary: 123 mg/dL — ABNORMAL HIGH (ref 70–99)
Glucose-Capillary: 131 mg/dL — ABNORMAL HIGH (ref 70–99)
Glucose-Capillary: 132 mg/dL — ABNORMAL HIGH (ref 70–99)
Glucose-Capillary: 73 mg/dL (ref 70–99)

## 2022-08-23 LAB — PHOSPHORUS: Phosphorus: 3.9 mg/dL (ref 2.5–4.6)

## 2022-08-23 LAB — MAGNESIUM: Magnesium: 1.9 mg/dL (ref 1.7–2.4)

## 2022-08-23 MED ORDER — PANTOPRAZOLE SODIUM 40 MG PO TBEC
40.0000 mg | DELAYED_RELEASE_TABLET | Freq: Two times a day (BID) | ORAL | Status: DC
  Administered 2022-08-23 – 2022-08-28 (×11): 40 mg via ORAL
  Filled 2022-08-23 (×11): qty 1

## 2022-08-23 NOTE — Progress Notes (Signed)
PROGRESS NOTE   Sarah Moore  KGM:[id number] DOB: 04-17-1950 DOA: 08/06/2022 PCP: Norm Salt, PA  Brief Narrative:  72 year old female Remote DVT not on anticoagulation, HLD chronic constipation/anal fissure status post flex sig 4/9 follows with Dr. Daleen Squibb to take lactulose Admit ABD pain/20 05/2022, no stool X1 week no nausea no vomiting--- CT ABD = colocolonic intussusception at descending and sigmoid colon 2/2 large intraluminal lipoma?  6.4 X4.5 4/23 had partial colectomy and end-colostomy 4/26 hypotensive systolic in 70s bolused IVF still hypotensive dropping hemoglobin WBC 13-profoundly hypoglycemic CBG 17-ICU transfer-brief vasopressor requirement 4/29 neurology consulted 2/2 new right facial droop left lower extremity weakness and echolalia-CT angiogram unremarkable MRI brain unremarkable 4/30 long-term EEG at [facility] showed acute toxic metabolic encephalopathy and epileptologist did not feel patient needed AEDs 5/1 hemoglobin 4.9---2 units PRBC, core track placed 5/2 ultrasound showed no liver lesions thrombi or biliary dilatation and small amount of gallbladder sludge, MRCP without biliary obstruction?  Cholecystitis-General surgery felt no role for HIDA scan  More coherent and continues to seemingly improve CTAB no  Hospital-Problem based course  Toxic metabolic encephalopathy secondary to hepatic encephalopathy Secondary to potentially delirium and hypoglycemia but had elevated ammonia on admission Promote circadian rhythms and seems to be improving day by day Lifelong lactulose 20 3 times daily given hepatic component to her encephalopathy  Colocolonic intussusception + sigmoid colectomy/ostomy 4/23 Appreciative WOC nurse input and ostomy management Tube feeds at goal of 55 --await  eating >60% of meals prior to removal of core track Currently eating 15-50% of meals at this time and having regular diet since 5/8 promote choices  Shock liver  hyperbilirubinemia CT and ultrasound?  Sludge in gallbladder but general surgery feels low suspicion for cholecystitis no role for HIDA scan Bilirubin trending downward slowly Icterus improving some  Unfounded anemia?  GI bleed  Large BM with dark appearing stool 4/28 Hemoglobin was spuriously low but seems to have stabilized in the 8 range although was transfused while in the ICU on 5/1  Hypokalemia Now resolved and stabilized Periodic rechecks  Dysphagia Improving  Impaired glucose tolerance and A1c 5.5 Stop CBG checks  Hepatic hemangioma 4/28 with known hepatic steatosis Outpatient follow-up and management  AAA incidental MRI finding needs ultrasound in 3 years  DVT prophylaxis: Heparin Code Status: DNR Family Communication: Family today-continues to improve slowly Disposition:  Status is: Inpatient  Will need to stay until can reliably be liberated from core track   Subjective:  Doing better today sitting up asking good questions No chest pain no fever   Objective: Vitals:   08/23/22 0430 08/23/22 0500 08/23/22 0845 08/23/22 1630  BP: 116/63  112/73 118/65  Pulse: 86  83 84  Resp:   16 16  Temp: 98.6 F (37 C)  98.7 F (37.1 C) 98.8 F (37.1 C)  TempSrc: Oral  Oral Oral  SpO2: 100%  100% 100%  Weight:  55.5 kg    Height:        Intake/Output Summary (Last 24 hours) at 08/23/2022 1820 Last data filed at 08/23/2022 1600 Gross per 24 hour  Intake 2695.75 ml  Output 1151 ml  Net 1544.75 ml    Filed Weights   08/21/22 0454 08/22/22 0500 08/23/22 0500  Weight: 54 kg 54 kg 55.5 kg    Examination:  EOMI NCAT no focal deficit-does have core track in place CTAB no added sound wheeze rales rhonchi Abdomen soft no rebound ostomy in place looks like it is working well  No lower extremity edema  Data Reviewed: personally reviewed   CBC    Component Value Date/Time   WBC 7.6 08/23/2022 0118   RBC 2.93 (L) 08/23/2022 0118   HGB 9.1 (L) 08/23/2022 0118    HGB 11.9 10/11/2008 1245   HCT 28.1 (L) 08/23/2022 0118   HCT 34.2 (L) 10/11/2008 1245   PLT 153 08/23/2022 0118   PLT 255 10/11/2008 1245   MCV 95.9 08/23/2022 0118   MCV 86 10/11/2008 1245   MCH 31.1 08/23/2022 0118   MCHC 32.4 08/23/2022 0118   RDW 23.1 (H) 08/23/2022 0118   RDW 12.1 10/11/2008 1245   LYMPHSABS 2.6 08/06/2022 1230   LYMPHSABS 2.1 10/11/2008 1245   MONOABS 0.4 08/06/2022 1230   EOSABS 0.1 08/06/2022 1230   EOSABS 0.1 10/11/2008 1245   BASOSABS 0.0 08/06/2022 1230   BASOSABS 0.0 10/11/2008 1245      Latest Ref Rng & Units 08/23/2022    1:18 AM 08/21/2022    1:12 AM 08/20/2022    6:55 AM  CMP  Glucose 70 - 99 mg/dL 295  621  308   BUN 8 - 23 mg/dL 13  9  12    Creatinine 0.44 - 1.00 mg/dL 6.57  8.46  9.62   Sodium 135 - 145 mmol/L 138  137  138   Potassium 3.5 - 5.1 mmol/L 4.5  4.3  4.1   Chloride 98 - 111 mmol/L 109  110  106   CO2 22 - 32 mmol/L 23  23  26    Calcium 8.9 - 10.3 mg/dL 9.1  8.8  8.7   Total Protein 6.5 - 8.1 g/dL 6.0  5.6  5.7   Total Bilirubin 0.3 - 1.2 mg/dL 9.2  95.2  84.1   Alkaline Phos 38 - 126 U/L 228  183  172   AST 15 - 41 U/L 136  107  96   ALT 0 - 44 U/L 191  234  269      Radiology Studies: No results found.   Scheduled Meds:  Chlorhexidine Gluconate Cloth  6 each Topical Daily   feeding supplement  1 Container Oral TID BM   heparin injection (subcutaneous)  5,000 Units Subcutaneous Q8H   lactulose  20 g Per Tube TID   mouth rinse  15 mL Mouth Rinse 4 times per day   pantoprazole  40 mg Oral BID   Continuous Infusions:  sodium chloride 5 mL/hr at 08/18/22 1200   feeding supplement (OSMOLITE 1.5 CAL) 1,000 mL (08/20/22 1626)     LOS: 17 days   Time spent: 30  Rhetta Mura, MD [facility] To contact the attending provider between 7A-7P or the covering provider during after hours 7P-7A, please log into the web site [email or web address] and access using universal [place] password for that web site. If you  do not have the password, please call the hospital operator.  08/23/2022, 6:20 PM

## 2022-08-23 NOTE — Consult Note (Signed)
WOC Nurse ostomy follow up Stoma type/location:  LUQ end colostomy  Stomal assessment/size: 7/8" slightly oval, slightly above skin level, mucocutaneous separation as previously noted Peristomal assessment: intact  Treatment options for stomal/peristomal skin: 2" barrier ring and another 2" barrier ring cut in half and strips used to fill in dip at 9 o'clock (this is where ostomy often leaks) Output 50 mls liquid brown stool  Ostomy pouching: 1pc. [employer] [place] [phone])  Education provided: Patient has consistently been unable to participate in the care of her ostomy.  Had one teaching session prior to going to ICU where she did empty the pouch however since then has not been able to do anything with her own ostomy other than practice closing it with some assistance.  Did talk to daughter over the phone who states she will not be the one to educate on care of her mothers ostomy but there is a niece they feel will do it.  I spoke with Miosha, patients niece over the phone who agrees to meet on Monday at 10 a.m. for teaching session. Patient plans to go to SNF at discharge and then daughter is hopeful for home health once she is home.  I stressed to daughter that the ostomy pouch will need to be emptied several times a day and the entire ostomy system changed twice a week and as needed for leaking.  I told patient and daughter that even if patient has home health they will be looking for a caregiver to teach.  Daughter expressed understanding and feels niece will be very helpful with care of ostomy.   This RN had changed ostomy earlier in the shift as it was leaking when assessed.  No real teaching with patient other than having her close and open pouch.   Enrolled patient in [place] [employer] Discharge program: Yes, had daughter go back in patients emails to 08/09/2022 and she did find correspondence from [employer]. Also verified they received box at home.  I told daughter we will send patient to  rehab with a months worth of supplies but then they will need to order supplies going forward.  Encouraged daughter to reach out to [employer].  Will take [employer] catalog and ostomy clinic information to room as well.   WOC team will continue to follow patient for ostomy education and support.   Thank you,    Priscella Mann MSN, RN-BC, [employer] [phone]

## 2022-08-23 NOTE — Progress Notes (Signed)
Occupational Therapy Treatment Patient Details Name: Sarah Moore MRN: [id number] DOB: [birth year] Today's Date: 08/23/2022   History of present illness 72 y.o. female presents to ED on 4/22 with complaints of abdominal pain. Dx of colonic intussuseption, s/p partial colectomy with end colostomy 08/07/22. Course complicated by confusion and hypotension starting 4/26, transfer to ICU, code stroke (negative), anemia, hyperbilirubinemia. PMH includes HLD, remote DVT, OA, GERD.   OT comments  Pt very tired today, limiting session. Pt was able to perform ADLs sitting at EOB, set up for grooming and hand hygiene, max A for LB dressing. Pt able to transfer to [facility], min A with HHA step pivot transfer, increased time, some dizziness, no LOB. Pt tolerated session well but unable to continue due to fatigue. Pt would benefit greatly from continued skilled therapy to improve endurance and strength.   Recommendations for follow up therapy are one component of a multi-disciplinary discharge planning process, led by the attending physician.  Recommendations may be updated based on patient status, additional functional criteria and insurance authorization.    Assistance Recommended at Discharge Frequent or constant Supervision/Assistance  Patient can return home with the following  A lot of help with walking and/or transfers;A lot of help with bathing/dressing/bathroom   Equipment Recommendations  Other (comment) (defer)    Recommendations for Other Services      Precautions / Restrictions Precautions Precautions: Fall Precaution Comments: colostomy, abdominal surgery, cortrak Restrictions Weight Bearing Restrictions: No       Mobility Bed Mobility Overal bed mobility: Needs Assistance Bed Mobility: Supine to Sit, Sit to Supine     Supine to sit: Min guard Sit to supine: Min assist   General bed mobility comments: increased time, assistance with BLEs    Transfers Overall transfer level: Needs  assistance Equipment used: Rolling walker (2 wheels) Transfers: Sit to/from Stand, Bed to chair/wheelchair/BSC Sit to Stand: Min assist Stand pivot transfers: Min assist   Step pivot transfers: Min assist     General transfer comment: EOB to BSC, no LOB, some dizziness, fatigue     Balance Overall balance assessment: Needs assistance Sitting-balance support: No upper extremity supported, Feet supported Sitting balance-Leahy Scale: Fair Sitting balance - Comments: sitting EOB performing grooming/hand hygiene   Standing balance support: During functional activity, Bilateral upper extremity supported, Reliant on assistive device for balance Standing balance-Leahy Scale: Fair Standing balance comment: with RW support, HHA performing perineal hygiene while standing                           ADL either performed or assessed with clinical judgement   ADL Overall ADL's : Needs assistance/impaired Eating/Feeding: NPO   Grooming: Minimal assistance;Sitting                   Toilet Transfer: Minimal assistance;Stand-pivot;BSC/3in1   Toileting- Clothing Manipulation and Hygiene: Minimal assistance;Sit to/from stand         General ADL Comments: Pt able to stand with min A HHA, able to transfer to [facility]. Pt able to stand and perform perineal hygiene min A for support. some dizziness, no LOB    Extremity/Trunk Assessment              Vision       Perception     Praxis      Cognition Arousal/Alertness: Awake/alert Behavior During Therapy: WFL for tasks assessed/performed Overall Cognitive Status: Within Functional Limits for tasks assessed  Exercises      Shoulder Instructions       General Comments      Pertinent Vitals/ Pain       Pain Assessment Pain Assessment: No/denies pain  Home Living                                          Prior Functioning/Environment               Frequency  Min 2X/week        Progress Toward Goals  OT Goals(current goals can now be found in the care plan section)  Progress towards OT goals: Progressing toward goals  Acute Rehab OT Goals Patient Stated Goal: to improve endurance and strength OT Goal Formulation: With patient Time For Goal Achievement: 09/02/22 Potential to Achieve Goals: Good ADL Goals Pt Will Perform Upper Body Bathing: with min assist;sitting Pt Will Perform Lower Body Dressing: with min assist;sit to/from stand Pt Will Transfer to Toilet: with min assist;stand pivot transfer;bedside commode Pt Will Perform Toileting - Clothing Manipulation and hygiene: with min assist;sit to/from stand;sitting/lateral leans Additional ADL Goal #1: Pt will demonstrate sustained attention to perform three grooming tasks with Min cues  Plan Discharge plan remains appropriate    Co-evaluation                 AM-PAC OT "6 Clicks" Daily Activity     Outcome Measure   Help from another person eating meals?: Total Help from another person taking care of personal grooming?: A Little Help from another person toileting, which includes using toliet, bedpan, or urinal?: A Little Help from another person bathing (including washing, rinsing, drying)?: A Lot Help from another person to put on and taking off regular upper body clothing?: A Lot Help from another person to put on and taking off regular lower body clothing?: A Lot 6 Click Score: 13    End of Session Equipment Utilized During Treatment: Gait belt;Rolling walker (2 wheels)  OT Visit Diagnosis: Unsteadiness on feet (R26.81);Other abnormalities of gait and mobility (R26.89);Muscle weakness (generalized) (M62.81)   Activity Tolerance Patient tolerated treatment well;Patient limited by fatigue   Patient Left in bed;with call bell/phone within reach;with bed alarm set   Nurse Communication Mobility status        Time: 0981-1914 OT Time  Calculation (min): 14 min  Charges: OT General Charges $OT Visit: 1 Visit OT Treatments $Self Care/Home Management : 8-22 mins  [place], OTR/L   Alexis Goodell 08/23/2022, 3:44 PM

## 2022-08-23 NOTE — TOC Initial Note (Signed)
Transition of Care [facility]) - Initial/Assessment Note    Patient Details  Name: Sarah Moore MRN: [id number] Date of Birth: 10/30/1950  [facility]) CM/SW Contact:    Carley Hammed, LCSW Phone Number: 08/23/2022, 1:31 PM  Clinical Narrative:                 CSW met with pt and dtr, [name] at bedside and confirmed their interest in SNF. They have no preference at this time and were given a Medicare list to review. Next steps were discussed and questions answered. Pt still with Cortrak, which will need to be removed prior to proceeding with SNF placement. TOC will continue to follow for DC needs.     Expected Discharge Plan: Skilled Nursing Facility Barriers to Discharge: Continued Medical Work up, English as a second language teacher, SNF Pending bed offer   Patient Goals and CMS Choice Patient states their goals for this hospitalization and ongoing recovery are:: Pt wants to get stronger to return home. CMS Medicare.gov Compare Post Acute Care list provided to:: Patient Choice offered to / list presented to : Patient, Adult Children      Expected Discharge Plan and Services   Discharge Planning Services: CM Consult Post Acute Care Choice: Skilled Nursing Facility Living arrangements for the past 2 months: Apartment                 DME Arranged: Dan Humphreys youth DME Agency: [employer] Date DME Agency Contacted: 08/08/22 Time DME Agency Contacted: [phone] Representative spoke with at DME Agency: Shaune Leeks HH Arranged: RN, PT HH Agency: [facility] Date [facility] Agency Contacted: 08/08/22 Time HH Agency Contacted: [phone] Representative spoke with at [facility] Agency: [facility]  Prior Living Arrangements/Services Living arrangements for the past 2 months: Apartment Lives with:: Self Patient language and need for interpreter reviewed:: Yes Do you feel safe going back to the place where you live?: Yes      Need for Family Participation in Patient Care: Yes (Comment) Care  giver support system in place?: Yes (comment)   Criminal Activity/Legal Involvement Pertinent to Current Situation/Hospitalization: No - Comment as needed  Activities of Daily Living Home Assistive Devices/Equipment: None ADL Screening (condition at time of admission) Patient's cognitive ability adequate to safely complete daily activities?: Yes Is the patient deaf or have difficulty hearing?: No Does the patient have difficulty seeing, even when wearing glasses/contacts?: No Does the patient have difficulty concentrating, remembering, or making decisions?: No Patient able to express need for assistance with ADLs?: Yes Does the patient have difficulty dressing or bathing?: No Independently performs ADLs?: Yes (appropriate for developmental age) Does the patient have difficulty walking or climbing stairs?: No Weakness of Legs: None Weakness of Arms/Hands: None  Permission Sought/Granted Permission sought to share information with : Family Supports Permission granted to share information with : Yes, Verbal Permission Granted  Share Information with NAME: [name]  Permission granted to share info w AGENCY: [employer]  Permission granted to share info w Relationship: Daughter     Emotional Assessment Appearance:: Appears stated age Attitude/Demeanor/Rapport: Engaged Affect (typically observed): Appropriate Orientation: : Oriented to Self, Oriented to Place, Oriented to  Time, Oriented to Situation Alcohol / Substance Use: Not Applicable Psych Involvement: No (comment)  Admission diagnosis:  Intussusception (HCC) [K56.1] Hypokalemia [E87.6] Colonic intussusception (HCC) [K56.1] Patient Active Problem List   Diagnosis Date Noted   Hyperbilirubinemia 08/16/2022   ABLA (acute blood loss anemia) 08/16/2022   Hemorrhagic shock (HCC) 08/16/2022   Acute metabolic  encephalopathy 08/16/2022   Obstructive jaundice 08/15/2022   Protein-calorie malnutrition, severe 08/14/2022    Cerebrovascular accident (CVA) (HCC) 08/13/2022   Septic shock (HCC) 08/10/2022   Acute metabolic encephalopathy due to hypoglycemia 08/10/2022   Lactic acidosis 08/10/2022   Colonic intussusception (HCC) 08/06/2022   Hypokalemia 08/06/2022   HLD (hyperlipidemia) 08/06/2022   Syncope 07/12/2021   Palpitations 10/10/2020   Former tobacco use 10/10/2020   Hyperlipidemia 05/23/2020   CONSTIPATION, CHRONIC 02/18/2007   OSTEOARTHRITIS 02/18/2007   DVT, HX OF 02/18/2007   ARTHROSCOPY, KNEE, HX OF 02/18/2007   PCP:  Norm Salt, PA Pharmacy:   [phone] Ginette Otto, [place] - [address] [place] [address] Phone: [phone] Fax: [phone]     Social Determinants of Health (SDOH) Social History: SDOH Screenings   Food Insecurity: No Food Insecurity (08/06/2022)  Housing: Low Risk  (08/06/2022)  Transportation Needs: No Transportation Needs (08/06/2022)  Utilities: Not At Risk (08/06/2022)  Tobacco Use: Medium Risk (08/16/2022)   SDOH Interventions: Food Insecurity Interventions: Intervention Not Indicated Housing Interventions: Intervention Not Indicated Transportation Interventions: Intervention Not Indicated Utilities Interventions: Intervention Not Indicated   Readmission Risk Interventions    08/07/2022   10:37 AM  Readmission Risk Prevention Plan  Post Dischage Appt Complete  Medication Screening Complete  Transportation Screening Complete

## 2022-08-23 NOTE — TOC Progression Note (Deleted)
Transition of Care [facility]) - Progression Note    Patient Details  Name: Sarah Moore MRN: [id number] Date of Birth: 12/28/1950  Transition of Care [facility]) CM/SW Contact  Carley Hammed, LCSW Phone Number: 08/23/2022, 1:22 PM  Clinical Narrative:    CSW met with pt and dtr, Sarah Moore at bedside and confirmed their interest in SNF. They have no preference at this time and were given a Medicare list to review. Next steps were discussed and questions answered. Pt still with Cortrak, which will need to be removed prior to proceeding with SNF placement. TOC will continue to follow for DC needs.    Expected Discharge Plan: Home w Home Health Services Barriers to Discharge: Continued Medical Work up  Expected Discharge Plan and Services   Discharge Planning Services: CM Consult Post Acute Care Choice: Durable Medical Equipment, Home Health Living arrangements for the past 2 months: Apartment                 DME Arranged: Dan Humphreys youth DME Agency: [employer] Date DME Agency Contacted: 08/08/22 Time DME Agency Contacted: [phone] Representative spoke with at DME Agency: Shaune Leeks HH Arranged: RN, PT [facility] Agency: [facility] Date [facility] Agency Contacted: 08/08/22 Time HH Agency Contacted: 1410 Representative spoke with at [facility] Agency: Ephriam Knuckles   Social Determinants of Health (SDOH) Interventions SDOH Screenings   Food Insecurity: No Food Insecurity (08/06/2022)  Housing: Low Risk  (08/06/2022)  Transportation Needs: No Transportation Needs (08/06/2022)  Utilities: Not At Risk (08/06/2022)  Tobacco Use: Medium Risk (08/16/2022)    Readmission Risk Interventions    08/07/2022   10:37 AM  Readmission Risk Prevention Plan  Post Dischage Appt Complete  Medication Screening Complete  Transportation Screening Complete

## 2022-08-24 DIAGNOSIS — Z7189 Other specified counseling: Secondary | ICD-10-CM | POA: Diagnosis not present

## 2022-08-24 DIAGNOSIS — Z515 Encounter for palliative care: Secondary | ICD-10-CM | POA: Diagnosis not present

## 2022-08-24 DIAGNOSIS — K561 Intussusception: Secondary | ICD-10-CM | POA: Diagnosis not present

## 2022-08-24 LAB — COMPREHENSIVE METABOLIC PANEL
ALT: 168 U/L — ABNORMAL HIGH (ref 0–44)
AST: 136 U/L — ABNORMAL HIGH (ref 15–41)
Albumin: 1.9 g/dL — ABNORMAL LOW (ref 3.5–5.0)
Alkaline Phosphatase: 231 U/L — ABNORMAL HIGH (ref 38–126)
Anion gap: 4 — ABNORMAL LOW (ref 5–15)
BUN: 10 mg/dL (ref 8–23)
CO2: 24 mmol/L (ref 22–32)
Calcium: 9.1 mg/dL (ref 8.9–10.3)
Chloride: 107 mmol/L (ref 98–111)
Creatinine, Ser: 0.47 mg/dL (ref 0.44–1.00)
GFR, Estimated: >60 mL/min (ref >60)
Glucose, Bld: 128 mg/dL — ABNORMAL HIGH (ref 70–99)
Potassium: 4.2 mmol/L (ref 3.5–5.1)
Sodium: 135 mmol/L (ref 135–145)
Total Bilirubin: 8.3 mg/dL — ABNORMAL HIGH (ref 0.3–1.2)
Total Protein: 6 g/dL — ABNORMAL LOW (ref 6.5–8.1)

## 2022-08-24 LAB — CBC
HCT: 25.5 % — ABNORMAL LOW (ref 36.0–46.0)
Hemoglobin: 8.7 g/dL — ABNORMAL LOW (ref 12.0–15.0)
MCH: 31.6 pg (ref 26.0–34.0)
MCHC: 34.1 g/dL (ref 30.0–36.0)
MCV: 92.7 fL (ref 80.0–100.0)
Platelets: 172 10*3/uL (ref 150–400)
RBC: 2.75 MIL/uL — ABNORMAL LOW (ref 3.87–5.11)
RDW: 23.9 % — ABNORMAL HIGH (ref 11.5–15.5)
WBC: 6.9 10*3/uL (ref 4.0–10.5)
nRBC: 0 % (ref 0.0–0.2)

## 2022-08-24 LAB — GLUCOSE, CAPILLARY
Glucose-Capillary: 100 mg/dL — ABNORMAL HIGH (ref 70–99)
Glucose-Capillary: 106 mg/dL — ABNORMAL HIGH (ref 70–99)
Glucose-Capillary: 118 mg/dL — ABNORMAL HIGH (ref 70–99)
Glucose-Capillary: 120 mg/dL — ABNORMAL HIGH (ref 70–99)
Glucose-Capillary: 123 mg/dL — ABNORMAL HIGH (ref 70–99)
Glucose-Capillary: 135 mg/dL — ABNORMAL HIGH (ref 70–99)
Glucose-Capillary: 94 mg/dL (ref 70–99)

## 2022-08-24 MED ORDER — OSMOLITE 1.5 CAL PO LIQD: 910.0000 mL | ORAL | Status: DC

## 2022-08-24 NOTE — Progress Notes (Signed)
Per Dr. Mahala Menghini, RN to coordinate with dietician to decrease Osmolite 1.5 to 25 ml/hr. Spoke with Gerarda Gunther, RDH. New orders received.

## 2022-08-24 NOTE — Progress Notes (Addendum)
PROGRESS NOTE   Sarah Moore  ZOX:[id number] DOB: [birth year] DOA: 08/06/2022 PCP: Norm Salt, PA  Brief Narrative:  72 year old female Remote DVT not on anticoagulation, HLD chronic constipation/anal fissure status post flex sig 4/9 follows with Dr. Daleen Squibb to take lactulose Admit ABD pain/20 05/2022, no stool X1 week no nausea no vomiting--- CT ABD = colocolonic intussusception at descending and sigmoid colon 2/2 large intraluminal lipoma?  6.4 X4.5 4/23 had partial colectomy and end-colostomy 4/26 hypotensive systolic in 70s bolused IVF still hypotensive dropping hemoglobin WBC 13-profoundly hypoglycemic CBG 17-ICU transfer-brief vasopressor requirement 4/29 neurology consulted 2/2 new right facial droop left lower extremity weakness and echolalia-CT angiogram unremarkable MRI brain unremarkable 4/30 long-term EEG at [facility] showed acute toxic metabolic encephalopathy and epileptologist did not feel patient needed AEDs 5/1 hemoglobin 4.9---2 units PRBC, core track placed 5/2 ultrasound showed no liver lesions thrombi or biliary dilatation and small amount of gallbladder sludge, MRCP without biliary obstruction?  Cholecystitis-General surgery felt no role for HIDA scan  More coherent and continues to improve but still not eating about 50% of meals which is the rate limiting step for her to discharge to skilled facility  Hospital-Problem based course  Toxic metabolic encephalopathy secondary to hepatic encephalopathy Primarily secondary to elevated ammonia on admission--also hypoglycemia contributing Lifelong lactulose 20 3 times daily with underlying hepatic encephalopathy  Colocolonic intussusception + sigmoid colectomy/ostomy 4/23 Appreciative WOC nurse input and ostomy management Cutting back tube feeds in half to about 20 cc--diet liberalized on 5/8-currently eating about 20 to 50% of meals so far and will need to be close to 50% consistently to allow for adequate  nutrition at time of discharge  Shock liver hyperbilirubinemia CT and ultrasound?  Sludge in gallbladder but general surgery feels low suspicion for cholecystitis no role for HIDA scan Icterus improved Wrenn, bilirubin trending downward in the right direction and no further workup is needed  Unfounded anemia?  GI bleed  Large BM with dark appearing stool 4/28 Hemoglobin was spuriously low but seems to have stabilized in the 8 range although was transfused while in the ICU on 5/1 Periodic labs at this juncture  Hypokalemia Now resolved and stabilized Periodic labs at this juncture  Dysphagia Improving  Impaired glucose tolerance and A1c 5.5 Stop CBG checks  Hepatic hemangioma 4/28 with known hepatic steatosis Outpatient follow-up and management  AAA incidental MRI finding needs ultrasound in 3 years  DVT prophylaxis: Heparin Code Status: DNR Family Communication: updated [name] daughter 5/10 Disposition:  Status is: Inpatient  Will need to stay until can reliably be liberated from core track   Subjective:  Looks well coherent sitting up Food intake has been about 15% this morning Fever chills nausea vomiting No chest pain ROM intact  Objective: Vitals:   08/23/22 1630 08/23/22 2234 08/24/22 0500 08/24/22 0509  BP: 118/65 109/67  120/69  Pulse: 84 87  87  Resp: 16     Temp: 98.8 F (37.1 C) 98.4 F (36.9 C)    TempSrc: Oral Oral    SpO2: 100% 100%  100%  Weight:   55.7 kg   Height:        Intake/Output Summary (Last 24 hours) at 08/24/2022 0808 Last data filed at 08/24/2022 0653 Gross per 24 hour  Intake 4455 ml  Output 351 ml  Net 4104 ml    Filed Weights   08/22/22 0500 08/23/22 0500 08/24/22 0500  Weight: 54 kg 55.5 kg 55.7 kg    Examination:  EOMI  NCAT no focal deficit-does have core track in place CTAB no added sound wheeze rales rhonchi Abdomen soft no rebound ostomy with some stool No lower extremity edema ROM intact moving 4 limbs  equally Not confused at all  Data Reviewed: personally reviewed   CBC    Component Value Date/Time   WBC 6.9 08/24/2022 0308   RBC 2.75 (L) 08/24/2022 0308   HGB 8.7 (L) 08/24/2022 0308   HGB 11.9 10/11/2008 1245   HCT 25.5 (L) 08/24/2022 0308   HCT 34.2 (L) 10/11/2008 1245   PLT 172 08/24/2022 0308   PLT 255 10/11/2008 1245   MCV 92.7 08/24/2022 0308   MCV 86 10/11/2008 1245   MCH 31.6 08/24/2022 0308   MCHC 34.1 08/24/2022 0308   RDW 23.9 (H) 08/24/2022 0308   RDW 12.1 10/11/2008 1245   LYMPHSABS 2.6 08/06/2022 1230   LYMPHSABS 2.1 10/11/2008 1245   MONOABS 0.4 08/06/2022 1230   EOSABS 0.1 08/06/2022 1230   EOSABS 0.1 10/11/2008 1245   BASOSABS 0.0 08/06/2022 1230   BASOSABS 0.0 10/11/2008 1245      Latest Ref Rng & Units 08/24/2022    3:08 AM 08/23/2022    1:18 AM 08/21/2022    1:12 AM  CMP  Glucose 70 - 99 mg/dL 161  096  045   BUN 8 - 23 mg/dL 10  13  9    Creatinine 0.44 - 1.00 mg/dL 4.09  8.11  9.14   Sodium 135 - 145 mmol/L 135  138  137   Potassium 3.5 - 5.1 mmol/L 4.2  4.5  4.3   Chloride 98 - 111 mmol/L 107  109  110   CO2 22 - 32 mmol/L 24  23  23    Calcium 8.9 - 10.3 mg/dL 9.1  9.1  8.8   Total Protein 6.5 - 8.1 g/dL 6.0  6.0  5.6   Total Bilirubin 0.3 - 1.2 mg/dL 8.3  9.2  78.2   Alkaline Phos 38 - 126 U/L 231  228  183   AST 15 - 41 U/L 136  136  107   ALT 0 - 44 U/L 168  191  234      Radiology Studies: No results found.   Scheduled Meds:  Chlorhexidine Gluconate Cloth  6 each Topical Daily   feeding supplement  1 Container Oral TID BM   heparin injection (subcutaneous)  5,000 Units Subcutaneous Q8H   lactulose  20 g Per Tube TID   mouth rinse  15 mL Mouth Rinse 4 times per day   pantoprazole  40 mg Oral BID   Continuous Infusions:  sodium chloride 5 mL/hr at 08/18/22 1200   feeding supplement (OSMOLITE 1.5 CAL) 1,000 mL (08/24/22 0652)     LOS: 18 days   Time spent: 24  Rhetta Mura, MD [facility] To contact the  attending provider between 7A-7P or the covering provider during after hours 7P-7A, please log into the web site [email or web address] and access using universal [place] password for that web site. If you do not have the password, please call the hospital operator.  08/24/2022, 8:08 AM

## 2022-08-24 NOTE — Plan of Care (Signed)
  Problem: Education: Goal: Knowledge of General Education information will improve Description: Including pain rating scale, medication(s)/side effects and non-pharmacologic comfort measures Outcome: Progressing   Problem: Health Behavior/Discharge Planning: Goal: Ability to manage health-related needs will improve Outcome: Progressing   Problem: Clinical Measurements: Goal: Ability to maintain clinical measurements within normal limits will improve Outcome: Progressing Goal: Will remain free from infection Outcome: Progressing Goal: Diagnostic test results will improve Outcome: Progressing Goal: Respiratory complications will improve Outcome: Progressing Goal: Cardiovascular complication will be avoided Outcome: Progressing   Problem: Activity: Goal: Risk for activity intolerance will decrease Outcome: Progressing   Problem: Nutrition: Goal: Adequate nutrition will be maintained Outcome: Progressing   Problem: Coping: Goal: Level of anxiety will decrease Outcome: Progressing   Problem: Elimination: Goal: Will not experience complications related to bowel motility Outcome: Progressing Goal: Will not experience complications related to urinary retention Outcome: Progressing   Problem: Pain Managment: Goal: General experience of comfort will improve Outcome: Progressing   Problem: Safety: Goal: Ability to remain free from injury will improve Outcome: Progressing   Problem: Skin Integrity: Goal: Risk for impaired skin integrity will decrease Outcome: Progressing   Problem: Education: Goal: Understanding of discharge needs will improve Outcome: Progressing Goal: Verbalization of understanding of the causes of altered bowel function will improve Outcome: Progressing   Problem: Activity: Goal: Ability to tolerate increased activity will improve Outcome: Progressing   Problem: Bowel/Gastric: Goal: Gastrointestinal status for postoperative course will  improve Outcome: Progressing   Problem: Health Behavior/Discharge Planning: Goal: Identification of community resources to assist with postoperative recovery needs will improve Outcome: Progressing   Problem: Nutritional: Goal: Will attain and maintain optimal nutritional status will improve Outcome: Progressing   Problem: Clinical Measurements: Goal: Postoperative complications will be avoided or minimized Outcome: Progressing   Problem: Respiratory: Goal: Respiratory status will improve Outcome: Progressing   Problem: Skin Integrity: Goal: Will show signs of wound healing Outcome: Progressing   Problem: Safety: Goal: Non-violent Restraint(s) Outcome: Progressing

## 2022-08-24 NOTE — Progress Notes (Signed)
PT Cancellation Note  Patient Details Name: Sarah Moore MRN: [id number] DOB: 11/08/1950   Cancelled Treatment:    Reason Eval/Treat Not Completed: Patient declined, no reason specified. Pt declines PT twice on this date, reporting stomach discomfort. PT provided encouragement however the pt continues to decline intervention at this time. PT will follow up as time allows.   Arlyss Gandy 08/24/2022, 2:53 PM

## 2022-08-24 NOTE — Progress Notes (Signed)
Nutrition Follow-up  DOCUMENTATION CODES:   Severe malnutrition in context of acute illness/injury  INTERVENTION:  Continue Boost Breeze po TID, each supplement provides 250 kcal and 9 grams of protein  Recommend transitioning TF via Cortrak to nocturnal tube feeding: Osmolite 1.5 at 69ml/hr from 1800 -0800 ( per day) Provides 1365 kcal (76% of minimum estimated needs), 57g protein (81% of minimum estimated protein needs) and free water daily  Discussed possible PEG tube placement for long term nutrition with MD; start 48 hour calorie count- results to follow on Monday 5/13  NUTRITION DIAGNOSIS:   Severe Malnutrition related to acute illness as evidenced by mild fat depletion, moderate muscle depletion, energy intake < or equal to 50% for > or equal to 5 days. - remains applicable  GOAL:   Patient will meet greater than or equal to 90% of their needs - goal met via TF  MONITOR:   Diet advancement, Labs, Weight trends, I & O's, PO intake  REASON FOR ASSESSMENT:   Consult New TPN/TNA  ASSESSMENT:   72 y.o. female with PMH significant for HLD, chronic constipation who presented with complaints of abdominal pain, last BM 1.5 weeks ago, and loss of appetite. Admitted for colocolonic intussusception.  4/22 Admit 4/23 partial colectomy and end colostomy 5/1 Cortrak placed; tip gastric; TF started 5/4 ST adv diet to dysphagia 2, thin liquids  5/8 diet advanced to regular  Pt remains with limited PO intake. MD hoping to decrease TF and encourage adequate PO intake for ability to d/c to SNF. Pt only intermittently accepting Boost Breeze supplements (5 of 10 supplements received).   Per ST assessment today, pt reported that she "has to take her time" since she feels weaker than baseline and that eating therefore takes more effort. Given ongoing decreased PO intake, question whether this is a new baseline for pt. Pt would likely benefit from PEG tube placement for long  term nutrition. Discussed with MD.   Meal completions: 5/8: 25% breakfast, 60% lunch, 20% dinner 5/9: 15% breakfast, 50% lunch 5/10: 20% breakfast   Weight stable around 55.7 kg   Medications: lactulose, protonix  Labs: anion gap 4, alkaline phos 231, AST 136, ALT 168, CBG's 73-123 x24 hours  Colostomy output: x24 hours +463ml x12 hours  Diet Order:   Diet Order             Diet regular Room service appropriate? No; Fluid consistency: Thin  Diet effective now                   EDUCATION NEEDS:   Not appropriate for education at this time  Skin:  Skin Assessment: Skin Integrity Issues: Skin Integrity Issues:: Incisions Incisions: Medial abdomen  Last BM:  5/10 via colostomy  Height:   Ht Readings from Last 1 Encounters:  08/10/22 5' (1.524 m)    Weight:   Wt Readings from Last 1 Encounters:  08/24/22 55.7 kg   BMI:  Body mass index is 23.98 kg/m.  Estimated Nutritional Needs:   Kcal:  1800-2050 kcals  Protein:  70-85 grams  Fluid:  >/= 1.8L  Drusilla Kanner, RDN, LDN Clinical Nutrition

## 2022-08-24 NOTE — Progress Notes (Signed)
Speech Language Pathology Treatment: Dysphagia  Patient Details Name: Sarah Moore MRN: [id number] DOB: 07-Apr-1951 Today's Date: 08/24/2022 Time: 7829-5621 SLP Time Calculation (min) (ACUTE ONLY): 11 min  Assessment / Plan / Recommendation Clinical Impression  Pt was seen for dysphagia treatment. She reported that she "has to take her time" since she feels weaker that baseline and that eating therefore takes more effort. However, she denied symptoms of oropharyngeal dysphagia with meals and these reports were corroborated with pt's RN. Pt tolerated dysphagia 3, regular texture solids, and thin liquids via straw without overt s/s of aspiration. Mastication and oral clearance were WNL and no other symptoms of oropharyngeal dysphagia were noted. Pt's current diet of regular texture solids and thin liquids will be continued. Further skilled SLP services are not clinically indicated at this time.   HPI HPI: Patient is a 72 y.o. female with PMH: remote DVT not on anticoagulation, HLD, chronic constipation, GERD. She presented to the hospital on 08/06/22 with c/o abdominal pain and constipation. In ED, CT abd/pelvis revealed colo-colic intussusception beginning at the junction of the descending and sigmoid colon. She undewent exploratory laparotomy, sigmoid colectomy with colostomy on 08/07/22. SLP swallow evaluation ordered with surgery recommending to not advance diet beyond clears. Pt cleared for solids by surgery on 5/4; a dysphagia 2 diet with thin liquids recommended by SLP; per surgery's notes on 5/6, . Can advance diet/wean TF as tolerated with improved PO intake.      SLP Plan  Continue with current plan of care      Recommendations for follow up therapy are one component of a multi-disciplinary discharge planning process, led by the attending physician.  Recommendations may be updated based on patient status, additional functional criteria and insurance authorization.    Recommendations   Diet recommendations: Regular;Thin liquid Liquids provided via: Cup;Straw Medication Administration: Whole meds with liquid Supervision: Full supervision/cueing for compensatory strategies;Trained caregiver to feed patient;Staff to assist with self feeding Postural Changes and/or Swallow Maneuvers: Seated upright 90 degrees;Upright 30-60 min after meal                  Oral care BID     Dysphagia, unspecified (R13.10)     Continue with current plan of care    [name] Clock, MS, CCC-SLP Acute Rehabilitation Services Office number [phone]  Scheryl Marten  08/24/2022, 9:25 AM

## 2022-08-25 DIAGNOSIS — K561 Intussusception: Secondary | ICD-10-CM | POA: Diagnosis not present

## 2022-08-25 DIAGNOSIS — Z515 Encounter for palliative care: Secondary | ICD-10-CM | POA: Diagnosis not present

## 2022-08-25 LAB — GLUCOSE, CAPILLARY
Glucose-Capillary: 114 mg/dL — ABNORMAL HIGH (ref 70–99)
Glucose-Capillary: 114 mg/dL — ABNORMAL HIGH (ref 70–99)
Glucose-Capillary: 116 mg/dL — ABNORMAL HIGH (ref 70–99)
Glucose-Capillary: 79 mg/dL (ref 70–99)
Glucose-Capillary: 89 mg/dL (ref 70–99)
Glucose-Capillary: 98 mg/dL (ref 70–99)

## 2022-08-25 MED ORDER — TRAMADOL HCL 50 MG PO TABS: 25.0000 mg | ORAL_TABLET | Freq: Two times a day (BID) | ORAL | Status: DC | PRN

## 2022-08-25 MED ORDER — PANCRELIPASE (LIP-PROT-AMYL) 10440-39150 UNITS PO TABS
20880.0000 [IU] | ORAL_TABLET | Freq: Once | ORAL | Status: AC
  Administered 2022-08-25: 20880 [IU]
  Filled 2022-08-25: qty 2

## 2022-08-25 MED ORDER — SODIUM BICARBONATE 650 MG PO TABS
650.0000 mg | ORAL_TABLET | Freq: Once | ORAL | Status: AC
  Administered 2022-08-25: 650 mg
  Filled 2022-08-25: qty 1

## 2022-08-25 NOTE — Progress Notes (Addendum)
Palliative Medicine Inpatient Follow Up Note   HPI: Sarah Moore is a 72 y.o. female with past medical history significant for hyperlipidemia, chronic constipation, history of remote DVT not on anticoagulation who presented to  on 4/22 with complaints of abdominal pain. CT abdomen/pelvis with colocolonic intussusception at the junction of the descending and sigmoid colon likely due to a large intraluminal lipoma serving as a lead point, mild mural thickening of the into the septum without frank evidence of bowel ischemia concerning for partial functional colonic obstruction with moderate gaseous distention - s/p surgical intervention --> sigmoid colectomy. Has had a prolonged hospitalization complicated by shock liver and delirium. Palliative care has been asked to get involved to assist in additional goals of care conversations.    Today's Discussion 08/25/2022  *Please note that this is a verbal dictation therefore any spelling or grammatical errors are due to the "Dragon Medical One" system interpretation.  Chart reviewed inclusive of vital signs, progress notes, laboratory results, and diagnostic images.   I met with Sarah Moore this morning. She was alert and oriented this morning,. She shares concerns that her colostomy bag is leaking and she needs to use the bathroom. I was able to support in getting her OOB with the FWW - she was 1P assistance to stand and pivot. She is eating incredibly well as of this morning.   Per RN notes - coretrack has clogged. Patient is able to swallow therefore if unable to unclog might be worth removing to see how well patient can eat/drink on her own.  Sarah Moore is in agreement with getting OOB and mobilizing daily to maintain strength.   I called patients daughter, Sarah Moore to provide an update. A VM was left.  Questions and concerns addressed/Palliative Support Provided.  _________________________ Addendum:  I spoke to Sarah Moore over the phone this afternoon.  She shares they have had some difficult experiences with the unit.   Plan for Sarah Moore and her family to explore SNFs on Monday.   Objective Assessment: Vital Signs Vitals:   08/25/22 0425 08/25/22 0748  BP: 112/68 111/71  Pulse: 84 84  Resp: 17 18  Temp: 98.2 F (36.8 C) 98 F (36.7 C)  SpO2: 100% 100%    Intake/Output Summary (Last 24 hours) at 08/25/2022 1259 Last data filed at 08/25/2022 1100 Gross per 24 hour  Intake 1086 ml  Output 475 ml  Net 611 ml    Last Weight  Most recent update: 08/24/2022  6:53 AM    Weight  55.7 kg (122 lb 12.7 oz)            Gen: Elderly African-American female chronically ill in appearance HEENT: Core track in place, dry mucous membranes CV: Regular rate and rhythm  PULM: On room air breathing is even and nonlabored ABD: colostomy EXT: No edema Neuro: Alert to self, place, situation  SUMMARY OF RECOMMENDATIONS   DNAR/DNI  Continue present efforts to improve condition  Plan for SNF --> will need coretrack removed.  Nutrition involvement appreciate for adult failure to thrive  --> 48 hours calorie count is in process. Coretrack has clogged might be worth removing to see how well she does on her own.   Musculoskeletal Pain:  - Analgesic Balm - Tramadol 25mg  PO Q12H PRN  Incremental palliative care support pls call service is support is needed sooner  Billing based on MDM: Moderate ______________________________________________________________________________________ [name] [facility] Palliative Medicine Team Team Cell Phone: [phone] Please utilize secure chat with additional questions, if  there is no response within 30 minutes please call the above phone number  Palliative Medicine Team providers are available by phone from 7am to 7pm daily and can be reached through the team cell phone.  Should this patient require assistance outside of these hours, please call the patient's attending physician.

## 2022-08-25 NOTE — Progress Notes (Signed)
PROGRESS NOTE   Sarah Moore  ZOX:[id number] DOB: 07/14/1950 DOA: 08/06/2022 PCP: Norm Salt, PA  Brief Narrative:  72 year old female Remote DVT not on anticoagulation, HLD chronic constipation/anal fissure status post flex sig 4/9 follows with Dr. Daleen Squibb to take lactulose Admit ABD pain/20 05/2022, no stool X1 week no nausea no vomiting--- CT ABD = colocolonic intussusception at descending and sigmoid colon 2/2 large intraluminal lipoma?  6.4 X4.5 4/23 had partial colectomy and end-colostomy 4/26 hypotensive systolic in 70s bolused IVF still hypotensive dropping hemoglobin WBC 13-profoundly hypoglycemic CBG 17-ICU transfer-brief vasopressor requirement 4/29 neurology consulted 2/2 new right facial droop left lower extremity weakness and echolalia-CT angiogram unremarkable MRI brain unremarkable 4/30 long-term EEG at [facility] showed acute toxic metabolic encephalopathy and epileptologist did not feel patient needed AEDs 5/1 hemoglobin 4.9---2 units PRBC, core track placed 5/2 ultrasound showed no liver lesions thrombi or biliary dilatation and small amount of gallbladder sludge, MRCP without biliary obstruction?  Cholecystitis-General surgery felt no role for HIDA scan  More coherent and continues to improve And over the past a day to day and a half is eating close to 70% of meals  Hospital-Problem based course  Toxic metabolic encephalopathy secondary to hepatic encephalopathy Primarily secondary to elevated ammonia on admission--also hypoglycemia contributing Lifelong lactulose 20 3 times daily with underlying hepatic encephalopathy  Colocolonic intussusception + sigmoid colectomy/ostomy 4/23 Appreciative WOC nurse input and ostomy management Tube feeds to be cut back versus discontinue later today and core track to be pulled on 5/11 if eating >60% of meals  Shock liver hyperbilirubinemia CT and ultrasound?  Sludge in gallbladder but general surgery feels low suspicion  for cholecystitis no role for HIDA scan Icterus improved , bilirubin trending downward in the right direction and no further workup is needed  Unfounded anemia?  GI bleed  Large BM with dark appearing stool 4/28 Hemoglobin was spuriously low but seems to have stabilized in the 8 range although was transfused while in the ICU on 5/1 Periodic labs at this juncture  Hypokalemia Now resolved and stabilized Periodic labs at this juncture  Dysphagia Improving  Impaired glucose tolerance and A1c 5.5 Stop CBG checks  Hepatic hemangioma 4/28 with known hepatic steatosis Outpatient follow-up and management  AAA incidental MRI finding needs ultrasound in 3 years  DVT prophylaxis: Heparin Code Status: DNR Family Communication: updated [name] daughter 5/10 Disposition:  Status is: Inpatient  Was denied CIR placement but can go to skilled likely on Monday based on choices   Subjective:  Eating very well today tolerating full diet No fever no chills Coherent  Objective: Vitals:   08/24/22 2129 08/25/22 0425 08/25/22 0748 08/25/22 1551  BP: 114/70 112/68 111/71 115/73  Pulse: 94 84 84 77  Resp: 16 17 18 18   Temp: 98 F (36.7 C) 98.2 F (36.8 C) 98 F (36.7 C) 98.5 F (36.9 C)  TempSrc: Oral Oral  Oral  SpO2: 96% 100% 100% 96%  Weight:      Height:        Intake/Output Summary (Last 24 hours) at 08/25/2022 1715 Last data filed at 08/25/2022 1443 Gross per 24 hour  Intake 237 ml  Output 650 ml  Net -413 ml    Filed Weights   08/22/22 0500 08/23/22 0500 08/24/22 0500  Weight: 54 kg 55.5 kg 55.7 kg    Examination:  EOMI NCAT no focal deficit-does have core track in place Chest is clear S1-S2 no murmur Power 5/5 No asterixis Noticed icterus  Data Reviewed: personally reviewed   CBC    Component Value Date/Time   WBC 6.9 08/24/2022 0308   RBC 2.75 (L) 08/24/2022 0308   HGB 8.7 (L) 08/24/2022 0308   HGB 11.9 10/11/2008 1245   HCT 25.5 (L) 08/24/2022 0308    HCT 34.2 (L) 10/11/2008 1245   PLT 172 08/24/2022 0308   PLT 255 10/11/2008 1245   MCV 92.7 08/24/2022 0308   MCV 86 10/11/2008 1245   MCH 31.6 08/24/2022 0308   MCHC 34.1 08/24/2022 0308   RDW 23.9 (H) 08/24/2022 0308   RDW 12.1 10/11/2008 1245   LYMPHSABS 2.6 08/06/2022 1230   LYMPHSABS 2.1 10/11/2008 1245   MONOABS 0.4 08/06/2022 1230   EOSABS 0.1 08/06/2022 1230   EOSABS 0.1 10/11/2008 1245   BASOSABS 0.0 08/06/2022 1230   BASOSABS 0.0 10/11/2008 1245      Latest Ref Rng & Units 08/24/2022    3:08 AM 08/23/2022    1:18 AM 08/21/2022    1:12 AM  CMP  Glucose 70 - 99 mg/dL 191  478  295   BUN 8 - 23 mg/dL 10  13  9    Creatinine 0.44 - 1.00 mg/dL 6.21  3.08  6.57   Sodium 135 - 145 mmol/L 135  138  137   Potassium 3.5 - 5.1 mmol/L 4.2  4.5  4.3   Chloride 98 - 111 mmol/L 107  109  110   CO2 22 - 32 mmol/L 24  23  23    Calcium 8.9 - 10.3 mg/dL 9.1  9.1  8.8   Total Protein 6.5 - 8.1 g/dL 6.0  6.0  5.6   Total Bilirubin 0.3 - 1.2 mg/dL 8.3  9.2  84.6   Alkaline Phos 38 - 126 U/L 231  228  183   AST 15 - 41 U/L 136  136  107   ALT 0 - 44 U/L 168  191  234      Radiology Studies: No results found.   Scheduled Meds:  Chlorhexidine Gluconate Cloth  6 each Topical Daily   feeding supplement  1 Container Oral TID BM   heparin injection (subcutaneous)  5,000 Units Subcutaneous Q8H   lactulose  20 g Per Tube TID   mouth rinse  15 mL Mouth Rinse 4 times per day   pantoprazole  40 mg Oral BID   Continuous Infusions:  sodium chloride 5 mL/hr at 08/18/22 1200   feeding supplement (OSMOLITE 1.5 CAL)       LOS: 19 days   Time spent: 24  Rhetta Mura, MD [facility] To contact the attending provider between 7A-7P or the covering provider during after hours 7P-7A, please log into the web site [email or web address] and access using universal  password for that web site. If you do not have the password, please call the hospital operator.  08/25/2022, 5:15 PM

## 2022-08-25 NOTE — Progress Notes (Signed)
Palliative Medicine Inpatient Follow Up Note   HPI: Sarah Moore is a 72 y.o. female with past medical history significant for hyperlipidemia, chronic constipation, history of remote DVT not on anticoagulation who presented to  on 4/22 with complaints of abdominal pain. CT abdomen/pelvis with colocolonic intussusception at the junction of the descending and sigmoid colon likely due to a large intraluminal lipoma serving as a lead point, mild mural thickening of the into the septum without frank evidence of bowel ischemia concerning for partial functional colonic obstruction with moderate gaseous distention - s/p surgical intervention --> sigmoid colectomy. Has had a prolonged hospitalization complicated by shock liver and delirium. Palliative care has been asked to get involved to assist in additional goals of care conversations.    Today's Discussion 08/25/2022  *Please note that this is a verbal dictation therefore any spelling or grammatical errors are due to the "Dragon Medical One" system interpretation.  Chart reviewed inclusive of vital signs, progress notes, laboratory results, and diagnostic images. Continue on enteral nutrition.   I met with Sarah Moore this late morning at bedside. She shares that she had a fairly decent night but it still intermittently experiencing generalized musculoskeletal pain. We discussed using the muscle rub ordered and getting out of bed. I shared very low dose tramadol may be used as well.   Encouraged Sarah Moore to continue eating and drinking as tolerated. We reviewed that this is the only way the artificial feeding tube can be removed. She shares that her appetite has been increasing gradually.  WE further discussed the need for rehabilitation. Sarah Moore is in agreement with this and the ultimate goal thereafter of getting back home. She shares her family is working on aligning things in a way which will enable the care she needs.    Questions and concerns  addressed/Palliative Support Provided.   Objective Assessment: Vital Signs Vitals:   08/24/22 2129 08/25/22 0425  BP: 114/70 112/68  Pulse: 94 84  Resp: 16 17  Temp: 98 F (36.7 C) 98.2 F (36.8 C)  SpO2: 96% 100%    Intake/Output Summary (Last 24 hours) at 08/25/2022 4098 Last data filed at 08/25/2022 0048 Gross per 24 hour  Intake 1106 ml  Output 725 ml  Net 381 ml    Last Weight  Most recent update: 08/24/2022  6:53 AM    Weight  55.7 kg (122 lb 12.7 oz)            Gen: Elderly African-American female chronically ill in appearance HEENT: Core track in place, dry mucous membranes CV: Regular rate and rhythm  PULM: On room air breathing is even and nonlabored ABD: colostomy EXT: No edema Neuro: Alert to self and place  SUMMARY OF RECOMMENDATIONS   DNAR/DNI     Continue present efforts to improve condition  Plan for SNF --> will need coretrack removed.  Nutrition involvement appreciate for adult failure to thrive  --> 48 hours calorie count is in process  Musculoskeletal Pain:  - Analgesic Balm - Tramadol 25mg  PO Q12H PRN  Incremental palliative care support pls call service is support is needed sooner  Billing based on MDM: Moderate ______________________________________________________________________________________ [name] [place] Palliative Medicine Team Team Cell Phone: [phone] Please utilize secure chat with additional questions, if there is no response within 30 minutes please call the above phone number  Palliative Medicine Team providers are available by phone from 7am to 7pm daily and can be reached through the team cell phone.  Should this patient  require assistance outside of these hours, please call the patient's attending physician.

## 2022-08-25 NOTE — Progress Notes (Signed)
Cortrak been clogged since yesterday. Today patient been eating meal tray appropraitely. Per MD may discontinue cortrak if eating dinner about 60% of her meal tray.

## 2022-08-25 NOTE — Plan of Care (Signed)
  Problem: Health Behavior/Discharge Planning: Goal: Ability to manage health-related needs will improve Outcome: Not Progressing   Problem: Clinical Measurements: Goal: Will remain free from infection Outcome: Progressing Goal: Respiratory complications will improve Outcome: Not Progressing   Problem: Nutrition: Goal: Adequate nutrition will be maintained Outcome: Not Progressing   Problem: Coping: Goal: Level of anxiety will decrease Outcome: Progressing   Problem: Elimination: Goal: Will not experience complications related to urinary retention Outcome: Progressing

## 2022-08-25 NOTE — Progress Notes (Addendum)
Peg tube became clogged around 0030, nurse tried multiple interventions to unclogg PEG tube, pt PEG tube is still clogged at this moment. Night time on call provider notified.

## 2022-08-26 DIAGNOSIS — K561 Intussusception: Secondary | ICD-10-CM | POA: Diagnosis not present

## 2022-08-26 LAB — GLUCOSE, CAPILLARY
Glucose-Capillary: 93 mg/dL (ref 70–99)
Glucose-Capillary: 126 mg/dL — ABNORMAL HIGH (ref 70–99)
Glucose-Capillary: 104 mg/dL — ABNORMAL HIGH (ref 70–99)
Glucose-Capillary: 89 mg/dL (ref 70–99)
Glucose-Capillary: 93 mg/dL (ref 70–99)

## 2022-08-26 NOTE — Progress Notes (Signed)
PROGRESS NOTE   Sarah Moore  ZOX:[id number] DOB: 1950-12-13 DOA: 08/06/2022 PCP: Norm Salt, PA  Brief Narrative:   72 year old female Remote DVT not on anticoagulation, HLD chronic constipation/anal fissure status post flex sig 4/9 follows with Dr. Daleen Squibb to take lactulose Admit ABD pain/20 05/2022, no stool X1 week no nausea no vomiting--- CT ABD = colocolonic intussusception at descending and sigmoid colon 2/2 large intraluminal lipoma?  6.4 X4.5 4/23 had partial colectomy and end-colostomy 4/26 hypotensive systolic in 70s bolused IVF still hypotensive dropping hemoglobin WBC 13-profoundly hypoglycemic CBG 17-ICU transfer-brief vasopressor requirement 4/29 neurology consulted 2/2 new right facial droop left lower extremity weakness and echolalia-CT angiogram unremarkable MRI brain unremarkable 4/30 long-term EEG at [facility] showed acute toxic metabolic encephalopathy and epileptologist did not feel patient needed AEDs 5/1 hemoglobin 4.9---2 units PRBC, core track placed 5/2 ultrasound showed no liver lesions thrombi or biliary dilatation and small amount of gallbladder sludge, MRCP without biliary obstruction?  Cholecystitis-General surgery felt no role for HIDA scan  More coherent and continues to improve And over the past a day to day and a half is eating close to 70% of meals  Hospital-Problem based course  Toxic metabolic encephalopathy secondary to hepatic encephalopathy Primarily secondary to elevated ammonia on admission--also hypoglycemia contributing Lifelong lactulose 20 3 times daily with underlying hepatic encephalopathy  Colocolonic intussusception + sigmoid colectomy/ostomy 4/23 Appreciative WOC nurse input and ostomy management Tube feeds discontinued 5/11 as eating >50% meals Tolerated full breakfast 5/12  Shock liver hyperbilirubinemia CT and ultrasound?  Sludge in gallbladder but general surgery feels low suspicion for cholecystitis no role for HIDA  scan Repeat labs a.m.-contingent on these likely can discharge home  Unfounded anemia?  GI bleed  Large BM with dark appearing stool 4/28 Hemoglobin was spuriously low but seems to have stabilized in the 8 range although was transfused while in the ICU on 5/1 Periodic labs at this juncture  Hypokalemia Now resolved and stabilized Periodic labs at this juncture  Dysphagia Improving  Impaired glucose tolerance and A1c 5.5 Stop CBG checks  Hepatic hemangioma 4/28 with known hepatic steatosis Outpatient follow-up and management  AAA incidental MRI finding needs ultrasound in 3 years  DVT prophylaxis: Heparin Code Status: DNR Family Communication: Daughter called updated 5/12 Disposition:  Status is: Inpatient  Was denied CIR placement but can go to skilled likely on Monday based on choices   Subjective: Well ate a full breakfast [place] toast, bacon etc No n/v/chills  Objective: Vitals:   08/25/22 1929 08/26/22 0333 08/26/22 0405 08/26/22 0834  BP: 100/63 106/66  99/62  Pulse: 78 75  92  Resp: 17 16  18   Temp: 98.1 F (36.7 C) 98.5 F (36.9 C)  98 F (36.7 C)  TempSrc: Oral Oral  Oral  SpO2: 100% 100%    Weight:   52.4 kg   Height:        Intake/Output Summary (Last 24 hours) at 08/26/2022 1032 Last data filed at 08/26/2022 0645 Gross per 24 hour  Intake 474 ml  Output 775 ml  Net -301 ml    Filed Weights   08/23/22 0500 08/24/22 0500 08/26/22 0405  Weight: 55.5 kg 55.7 kg 52.4 kg    Examination:  EOMI NCAT no focal deficit-no CORE track Chest is clear S1-S2 no murmur Power 5/5 No asterixis Noticed icterus  Data Reviewed: personally reviewed   CBC    Component Value Date/Time   WBC 6.9 08/24/2022 0308   RBC 2.75 (L)  08/24/2022 0308   HGB 8.7 (L) 08/24/2022 0308   HGB 11.9 10/11/2008 1245   HCT 25.5 (L) 08/24/2022 0308   HCT 34.2 (L) 10/11/2008 1245   PLT 172 08/24/2022 0308   PLT 255 10/11/2008 1245   MCV 92.7 08/24/2022 0308   MCV 86  10/11/2008 1245   MCH 31.6 08/24/2022 0308   MCHC 34.1 08/24/2022 0308   RDW 23.9 (H) 08/24/2022 0308   RDW 12.1 10/11/2008 1245   LYMPHSABS 2.6 08/06/2022 1230   LYMPHSABS 2.1 10/11/2008 1245   MONOABS 0.4 08/06/2022 1230   EOSABS 0.1 08/06/2022 1230   EOSABS 0.1 10/11/2008 1245   BASOSABS 0.0 08/06/2022 1230   BASOSABS 0.0 10/11/2008 1245      Latest Ref Rng & Units 08/24/2022    3:08 AM 08/23/2022    1:18 AM 08/21/2022    1:12 AM  CMP  Glucose 70 - 99 mg/dL 161  096  045   BUN 8 - 23 mg/dL 10  13  9    Creatinine 0.44 - 1.00 mg/dL 4.09  8.11  9.14   Sodium 135 - 145 mmol/L 135  138  137   Potassium 3.5 - 5.1 mmol/L 4.2  4.5  4.3   Chloride 98 - 111 mmol/L 107  109  110   CO2 22 - 32 mmol/L 24  23  23    Calcium 8.9 - 10.3 mg/dL 9.1  9.1  8.8   Total Protein 6.5 - 8.1 g/dL 6.0  6.0  5.6   Total Bilirubin 0.3 - 1.2 mg/dL 8.3  9.2  78.2   Alkaline Phos 38 - 126 U/L 231  228  183   AST 15 - 41 U/L 136  136  107   ALT 0 - 44 U/L 168  191  234      Radiology Studies: No results found.   Scheduled Meds:  Chlorhexidine Gluconate Cloth  6 each Topical Daily   feeding supplement  1 Container Oral TID BM   heparin injection (subcutaneous)  5,000 Units Subcutaneous Q8H   lactulose  20 g Per Tube TID   mouth rinse  15 mL Mouth Rinse 4 times per day   pantoprazole  40 mg Oral BID   Continuous Infusions:  sodium chloride 5 mL/hr at 08/18/22 1200   feeding supplement (OSMOLITE 1.5 CAL)       LOS: 20 days   Time spent: 26  Rhetta Mura, MD [facility] To contact the attending provider between 7A-7P or the covering provider during after hours 7P-7A, please log into the web site [email or web address] and access using universal [place] password for that web site. If you do not have the password, please call the hospital operator.  08/26/2022, 10:32 AM

## 2022-08-26 NOTE — Progress Notes (Signed)
Patient transferred to/from beside commode  08/26/22 0645  Mobility  Activity Transferred to/from [facility]  Range of Motion/Exercises Passive  Level of Assistance Maximum assist, patient does 25-49%  Distance Ambulated (ft) 2 ft  Activity Response Tolerated well  Transport method Ambulatory  Hygiene  Hygiene Peri care   Patient was transferred to/from [facility]. Did well with getting up out of the bed with assistance from staff. She did get a little off balance while trying to pivot to the [facility] but was able to reestablish balance and get to the [facility] and back to bed with some assistance. Also completed some passive ROM exercises while getting in and out of bed.

## 2022-08-27 DIAGNOSIS — K561 Intussusception: Secondary | ICD-10-CM | POA: Diagnosis not present

## 2022-08-27 LAB — GLUCOSE, CAPILLARY
Glucose-Capillary: 106 mg/dL — ABNORMAL HIGH (ref 70–99)
Glucose-Capillary: 111 mg/dL — ABNORMAL HIGH (ref 70–99)
Glucose-Capillary: 113 mg/dL — ABNORMAL HIGH (ref 70–99)
Glucose-Capillary: 121 mg/dL — ABNORMAL HIGH (ref 70–99)
Glucose-Capillary: 87 mg/dL (ref 70–99)
Glucose-Capillary: 95 mg/dL (ref 70–99)

## 2022-08-27 LAB — CBC WITH DIFFERENTIAL/PLATELET
Abs Immature Granulocytes: 0.04 10*3/uL (ref 0.00–0.07)
Basophils Absolute: 0 10*3/uL (ref 0.0–0.1)
Basophils Relative: 0 %
Eosinophils Absolute: 0.1 10*3/uL (ref 0.0–0.5)
Eosinophils Relative: 2 %
HCT: 27.1 % — ABNORMAL LOW (ref 36.0–46.0)
Hemoglobin: 8.8 g/dL — ABNORMAL LOW (ref 12.0–15.0)
Immature Granulocytes: 1 %
Lymphocytes Relative: 44 %
Lymphs Abs: 2.2 10*3/uL (ref 0.7–4.0)
MCH: 31.4 pg (ref 26.0–34.0)
MCHC: 32.5 g/dL (ref 30.0–36.0)
MCV: 96.8 fL (ref 80.0–100.0)
Monocytes Absolute: 0.7 10*3/uL (ref 0.1–1.0)
Monocytes Relative: 15 %
Neutro Abs: 1.8 10*3/uL (ref 1.7–7.7)
Neutrophils Relative %: 38 %
Platelets: 220 10*3/uL (ref 150–400)
RBC: 2.8 MIL/uL — ABNORMAL LOW (ref 3.87–5.11)
RDW: 24.4 % — ABNORMAL HIGH (ref 11.5–15.5)
WBC: 4.9 10*3/uL (ref 4.0–10.5)
nRBC: 0 % (ref 0.0–0.2)

## 2022-08-27 LAB — COMPREHENSIVE METABOLIC PANEL
ALT: 190 U/L — ABNORMAL HIGH (ref 0–44)
AST: 216 U/L — ABNORMAL HIGH (ref 15–41)
Albumin: 2.1 g/dL — ABNORMAL LOW (ref 3.5–5.0)
Alkaline Phosphatase: 271 U/L — ABNORMAL HIGH (ref 38–126)
Anion gap: 8 (ref 5–15)
BUN: 14 mg/dL (ref 8–23)
CO2: 22 mmol/L (ref 22–32)
Calcium: 9.5 mg/dL (ref 8.9–10.3)
Chloride: 107 mmol/L (ref 98–111)
Creatinine, Ser: 0.5 mg/dL (ref 0.44–1.00)
GFR, Estimated: >60 mL/min (ref >60)
Glucose, Bld: 90 mg/dL (ref 70–99)
Potassium: 3.5 mmol/L (ref 3.5–5.1)
Sodium: 137 mmol/L (ref 135–145)
Total Bilirubin: 8.3 mg/dL — ABNORMAL HIGH (ref 0.3–1.2)
Total Protein: 7 g/dL (ref 6.5–8.1)

## 2022-08-27 MED ORDER — LIDOCAINE 5 % EX OINT: TOPICAL_OINTMENT | Freq: Three times a day (TID) | CUTANEOUS | 0 refills | Status: DC | PRN

## 2022-08-27 MED ORDER — WITCH HAZEL-GLYCERIN EX PADS: MEDICATED_PAD | CUTANEOUS | Status: DC | PRN

## 2022-08-27 MED ORDER — LACTULOSE 10 GM/15ML PO SOLN: 20.0000 g | Freq: Three times a day (TID) | ORAL | 0 refills | Status: AC

## 2022-08-27 MED ORDER — TRAMADOL HCL 50 MG PO TABS: 25.0000 mg | ORAL_TABLET | Freq: Two times a day (BID) | ORAL | 0 refills | Status: DC | PRN

## 2022-08-27 NOTE — TOC Progression Note (Signed)
Transition of Care [facility]) - Progression Note    Patient Details  Name: Sarah Moore MRN: [id number] Date of Birth: Jul 24, 1950  Transition [facility]) CM/SW Contact  Carley Hammed, LCSW Phone Number: 08/27/2022, 10:29 AM  Clinical Narrative:     CSW notified pt is medically stable to DC today. Pt's family interested in [facility]. [facility] to accept pending PT update. PT unable to update note since 5/8. CSW discussed importance of participation with family. TOC will continue to follow for DC planning.   Expected Discharge Plan: Skilled Nursing Facility Barriers to Discharge: Continued Medical Work up, English as a second language teacher, SNF Pending bed offer  Expected Discharge Plan and Services   Discharge Planning Services: CM Consult Post Acute Care Choice: Skilled Nursing Facility Living arrangements for the past 2 months: Apartment Expected Discharge Date: 08/27/22               DME Arranged: Dan Humphreys youth DME Agency: [employer] Date DME Agency Contacted: 08/08/22 Time DME Agency Contacted: [phone] Representative spoke with at DME Agency: Shaune Leeks HH Arranged: RN, PT [facility] Agency: [facility] Date [facility] Agency Contacted: 08/08/22 Time HH Agency Contacted: [phone] Representative spoke with at [facility] Agency: Ephriam Knuckles   Social Determinants of Health (SDOH) Interventions SDOH Screenings   Food Insecurity: No Food Insecurity (08/06/2022)  Housing: Low Risk  (08/06/2022)  Transportation Needs: No Transportation Needs (08/06/2022)  Utilities: Not At Risk (08/06/2022)  Tobacco Use: Medium Risk (08/16/2022)    Readmission Risk Interventions    08/07/2022   10:37 AM  Readmission Risk Prevention Plan  Post Dischage Appt Complete  Medication Screening Complete  Transportation Screening Complete

## 2022-08-27 NOTE — TOC Progression Note (Addendum)
Transition of Care [facility]) - Progression Note    Patient Details  Name: Sarah Moore MRN: [id number] Date of Birth: [birth year]  Transition of Care [facility]) CM/SW Contact  Carley Hammed, LCSW Phone Number: 08/27/2022, 1:11 PM  Clinical Narrative:     CSW advised that pt is medically stable to DC, cortrak out on Saturday. CSW spoke with dtr who stated a preference for [facility]. CSW spoke with Pernell Dupre who stated they would confirm once PT note is in. CSW notified [facility] when PT was entered, waiting for bed confirmation. TOC will continue to follow for DC needs.   2:40 Pt has been accepted to [facility], [place] approved. Plan to DC tomorrow. Family aware. TOC will continue to follow.   Expected Discharge Plan: Skilled Nursing Facility Barriers to Discharge: Continued Medical Work up, English as a second language teacher, SNF Pending bed offer  Expected Discharge Plan and Services   Discharge Planning Services: CM Consult Post Acute Care Choice: Skilled Nursing Facility Living arrangements for the past 2 months: Apartment Expected Discharge Date: 08/27/22               DME Arranged: Dan Humphreys youth DME Agency: [employer] Date DME Agency Contacted: 08/08/22 Time DME Agency Contacted: [phone] Representative spoke with at DME Agency: Shaune Leeks HH Arranged: RN, PT [facility] Agency: [facility] Date [facility] Agency Contacted: 08/08/22 Time HH Agency Contacted: 1410 Representative spoke with at [facility] Agency: Ephriam Knuckles   Social Determinants of Health (SDOH) Interventions SDOH Screenings   Food Insecurity: No Food Insecurity (08/06/2022)  Housing: Low Risk  (08/06/2022)  Transportation Needs: No Transportation Needs (08/06/2022)  Utilities: Not At Risk (08/06/2022)  Tobacco Use: Medium Risk (08/16/2022)    Readmission Risk Interventions    08/07/2022   10:37 AM  Readmission Risk Prevention Plan  Post Dischage Appt Complete  Medication Screening Complete  Transportation Screening Complete

## 2022-08-27 NOTE — NC FL2 (Signed)
[place] MEDICAID FL2 LEVEL OF CARE FORM     IDENTIFICATION  Patient Name: Sarah Moore Birthdate: 1950-12-05 Sex: female Admission Date (Current Location): 08/06/2022  [facility] and [place] Number:  Producer, television/film/video and Address:  [facility], [address], [place], [place] [address]      Provider Number: [id number]  Attending Physician Name and Address:  Rhetta Mura, MD  Relative Name and Phone Number:  [phone]    Current Level of Care: Hospital Recommended Level of Care: Skilled Nursing Facility Prior Approval Number:    Date Approved/Denied:   PASRR Number: [id number]  Discharge Plan: SNF    Current Diagnoses: Patient Active Problem List   Diagnosis Date Noted   Hyperbilirubinemia 08/16/2022   ABLA (acute blood loss anemia) 08/16/2022   Hemorrhagic shock (HCC) 08/16/2022   Acute metabolic encephalopathy 08/16/2022   Obstructive jaundice 08/15/2022   Protein-calorie malnutrition, severe 08/14/2022   Cerebrovascular accident (CVA) (HCC) 08/13/2022   Septic shock (HCC) 08/10/2022   Acute metabolic encephalopathy due to hypoglycemia 08/10/2022   Lactic acidosis 08/10/2022   Colonic intussusception (HCC) 08/06/2022   Hypokalemia 08/06/2022   HLD (hyperlipidemia) 08/06/2022   Syncope 07/12/2021   Palpitations 10/10/2020   Former tobacco use 10/10/2020   Hyperlipidemia 05/23/2020   CONSTIPATION, CHRONIC 02/18/2007   OSTEOARTHRITIS 02/18/2007   DVT, HX OF 02/18/2007   ARTHROSCOPY, KNEE, HX OF 02/18/2007    Orientation RESPIRATION BLADDER Height & Weight     Self, Time, Situation, Place  Normal Continent Weight: 115 lb 8.3 oz (52.4 kg) Height:  5' (152.4 cm)  BEHAVIORAL SYMPTOMS/MOOD NEUROLOGICAL BOWEL NUTRITION STATUS      Continent Diet (See DC summary)  AMBULATORY STATUS COMMUNICATION OF NEEDS Skin   Extensive Assist Verbally Surgical wounds (Abdomen incision)                       Personal Care  Assistance Level of Assistance  Bathing, Feeding, Dressing Bathing Assistance: Maximum assistance Feeding assistance: Limited assistance Dressing Assistance: Maximum assistance     Functional Limitations Info  Sight, Hearing, Speech Sight Info: Adequate Hearing Info: Adequate Speech Info: Adequate    SPECIAL CARE FACTORS FREQUENCY  PT (By licensed PT), OT (By licensed OT)     PT Frequency: 5x week OT Frequency: 5x week            Contractures Contractures Info: Not present    Additional Factors Info  Code Status, Allergies Code Status Info: DNR Allergies Info: Codeine           Current Medications (08/27/2022):  This is the current hospital active medication list Current Facility-Administered Medications  Medication Dose Route Frequency Provider Last Rate Last Admin   0.9 %  sodium chloride infusion   Intravenous PRN [place], Alvira Philips, DO 5 mL/hr at 08/18/22 1200 Infusion Verify at 08/18/22 1200   Chlorhexidine Gluconate Cloth 2 % PADS 6 each  6 each Topical Daily [place], Eric J, DO   6 each at 08/26/22 1106   heparin injection 5,000 Units  5,000 Units Subcutaneous Q8H Karie Fetch P, DO   5,000 Units at 08/27/22 0849   lactulose (CHRONULAC) 10 GM/15ML solution 20 g  20 g Per Tube TID Karie Fetch P, DO   20 g at 08/27/22 0849   lidocaine (XYLOCAINE) 5 % ointment   Topical TID PRN Elease Etienne, MD   1 Application at 08/21/22 2246   Muscle Rub CREA   Topical  PRN Ernie Avena, NP   Given at 08/22/22 0144   naphazoline-glycerin (CLEAR EYES REDNESS) ophth solution 1-2 drop  1-2 drop Both Eyes QID PRN Hetty Blend C, NP   2 drop at 08/20/22 2135   ondansetron (ZOFRAN) injection 4 mg  4 mg Intravenous Q6H PRN Barnetta Chapel, PA-C   4 mg at 08/10/22 1610   Oral care mouth rinse  15 mL Mouth Rinse 4 times per day Karie Fetch P, DO   15 mL at 08/27/22 9604   Oral care mouth rinse  15 mL Mouth Rinse PRN Karie Fetch P, DO       pantoprazole (PROTONIX) EC tablet 40 mg   40 mg Oral BID Quenton Fetter, RPH   40 mg at 08/27/22 0850   traMADol (ULTRAM) tablet 25 mg  25 mg Oral Q12H PRN Ernie Avena, NP         Discharge Medications: Please see discharge summary for a list of discharge medications.  Relevant Imaging Results:  Relevant Lab Results:   Additional Information SS# [id number] [place]

## 2022-08-27 NOTE — Plan of Care (Signed)

## 2022-08-27 NOTE — Consult Note (Signed)
WOC Nurse ostomy follow up Stoma type/location: LUQ end colostomy  Niece is at bedside to observe pouch change today.  Patient states her daughter is touring [employer] and they want her to do rehab, which she agrees to do.  Stomal assessment/size: oval  7/8"  Stomal separation from 9 to 1 o'clock.  Covered with barrier ring Peristomal assessment: Deep creasing at 3 and 9 o'clock.   Treatment options for stomal/peristomal skin: 2 barrier rings (around stoma and in deep creasing)   Output soft brown stool Ostomy pouching: 1pc. Flexible convex with barrier rings  Added ostomy belt.  Large is too long, but I have pinned it to fit for now for added security at areas of leakage (3 and 9 o'clock)  will need medium belt   Education provided: POuch change performed.   Niece verbalizes understanding of pouch change procedure.  Enrolled patient in [place] [employer] Discharge program: Yes previously Discussed outpatient ostomy clinic and patient and family would like to be seen after discharge.  Will follow.  Mike Gip MSN, RN, FNP-BC CWON Wound, Ostomy, Continence Nurse Outpatient [facility] [phone] Pager [phone]

## 2022-08-27 NOTE — Discharge Summary (Signed)
Physician Discharge Summary  Sarah Moore ZOX:[id number] DOB: Jul 07, 1950 DOA: 08/06/2022  PCP: Norm Salt, PA  Admit date: 08/06/2022 Discharge date: 08/27/2022  Time spent: 47 minutes  Recommendations for Outpatient Follow-up:  Chem-12 CBC about 1 week at skilled facility-does have underlying steatosis and will need probable further follow-up with Dr. Elnoria Howard in the outpatient setting for management-has a known hepatic hemangioma in addition which needs monitoring Needs ultrasound of AAA in about 3 years time Mandatory lactulose on discharge Ostomy care as per wound nurse patient is quite comfortable with management and is learning how to use the same  Discharge Diagnoses:  MAIN problem for hospitalization   Intussusception status post surgery Shock liver Steatosis requiring lactulose Anemia of acute blood loss with transfusion Dysphagia Hepatic hemangioma AAA   Please see below for itemized issues addressed in HOpsital- refer to other progress notes for clarity if needed  Discharge Condition: Fair  Diet recommendation: Heart healthy  Filed Weights   08/23/22 0500 08/24/22 0500 08/26/22 0405  Weight: 55.5 kg 55.7 kg 52.4 kg    History of present illness:  72 year old female Remote DVT not on anticoagulation, HLD chronic constipation/anal fissure status post flex sig 4/9 follows with Dr. Daleen Squibb to take lactulose--probable underlying cirrhosis/steatosis Admit ABD pain no stool X1 week no nausea no vomiting--- CT ABD = colocolonic intussusception at descending and sigmoid colon 2/2 large intraluminal lipoma?  6.4 X4.5-note that this was negative for any type of cancer and it was ischemic injury 4/23 had partial colectomy and end-colostomy 4/26 hypotensive systolic in 70s bolused IVF still hypotensive dropping hemoglobin WBC 13-profoundly hypoglycemic CBG 17-ICU transfer-brief vasopressor requirement 4/29 neurology consulted 2/2 new right facial droop left lower  extremity weakness and echolalia-CT angiogram unremarkable MRI brain unremarkable 4/30 long-term EEG at [facility] showed acute toxic metabolic encephalopathy and epileptologist did not feel patient needed AEDs 5/1 hemoglobin 4.9---2 units PRBC, core track placed 5/2 ultrasound showed no liver lesions thrombi or biliary dilatation and small amount of gallbladder sludge, MRCP without biliary obstruction?  Cholecystitis-General surgery felt no role for HIDA scan   More coherent and continues to improve And over the past a day to day and a half is eating close to 70% of meals decision made 5/11 versus 5/12 to DC NG tube/core track and patient eating well and tolerating diet    Hospital Course:  Toxic metabolic encephalopathy secondary to hepatic encephalopathy Primarily secondary to elevated ammonia on admission--also hypoglycemia contributing Lifelong lactulose 20 3 times daily with underlying hepatic encephalopathy Mentation is improved-she will need good follow-up in the outpatient setting with gastroenterology Dr. Elnoria Howard who will be CCed on discharge   Colocolonic intussusception + sigmoid colectomy/ostomy 4/23 associated with removal of 6.5 x 4.5 lipoma which was nonmalignant Appreciative WOC nurse input and ostomy management Tube feeds discontinued 5/11 as eating >50% meals Tolerating full meals without issue and managing to keep them down without issue as well as able to keep up nutrition Stabilized for discharge Patient's niece is coming to learn how to use the ostomy and patient is reasonably comfortable with self-management   Shock liver hyperbilirubinemia CT and ultrasound?  Sludge in gallbladder but general surgery feels low suspicion for cholecystitis no role for HIDA scan LFT's have trended up over the past several days however she has no abdominal pain no fever and although her bilirubin remains within the 8-9 range, she is stable for close outpatient follow-up with Dr. Tanda Rockers think  that this is a combination of her  underlying liver insufficiency from shock liver post surgery but also her underlying hepatic steatosis/cirrhosis Probably will need to be transferred/evaluated at a liver center eventually depending on labs   Unfounded anemia?  GI bleed  Large BM with dark appearing stool 4/28 Hemoglobin was spuriously low but seems to have stabilized in the 8 range although was transfused while in the ICU on 5/1 Periodic labs at this juncture   Hypokalemia Now resolved and stabilized Periodic labs at this juncture   Impaired glucose tolerance and A1c 5.5 Stop CBG checks-outpatient follow-up and discussion   Hepatic hemangioma 4/28 with known hepatic steatosis Outpatient follow-up and management   AAA incidental MRI finding needs ultrasound in 3 years   Discharge Exam: Vitals:   08/26/22 1728 08/26/22 2019  BP: (!) 110/58 (!) 104/59  Pulse: 71 88  Resp: 16 18  Temp: 98.3 F (36.8 C) 98.7 F (37.1 C)  SpO2: 100% 100%    Subj on day of d/c   Awake pleasant ate 100% of meals no chest pain no fever no nausea no vomiting Frail cachectic still icteric Chest is clear no wheeze rales rhonchi Abdomen is soft with ostomy in place midline scar No lower extremity edema Plan No encephalopathy Power 5/5    Discharge Instructions   Discharge Instructions     Amb Referral to Ostomy Clinic   Complete by: As directed    Reason for referral modifiers: Pre and post-operative counseling for ostomy management   Diet - low sodium heart healthy   Complete by: As directed    Increase activity slowly   Complete by: As directed       Allergies as of 08/27/2022       Reactions   Codeine Palpitations        Medication List     STOP taking these medications    atorvastatin 20 MG tablet Commonly known as: LIPITOR   gabapentin 300 MG capsule Commonly known as: NEURONTIN   hydrOXYzine 25 MG tablet Commonly known as: ATARAX   PRESCRIPTION MEDICATION        TAKE these medications    cycloSPORINE 0.05 % ophthalmic emulsion Commonly known as: RESTASIS Place 1 drop into both eyes as needed (for dry eyes).   lactulose 10 GM/15ML solution Commonly known as: CHRONULAC Place 30 mLs (20 g total) into feeding tube 3 (three) times daily. What changed:  how much to take how to take this when to take this reasons to take this   lidocaine 5 % ointment Commonly known as: XYLOCAINE Apply topically 3 (three) times daily as needed for mild pain or moderate pain.   multivitamin with minerals Tabs tablet Take 2 tablets by mouth daily.   omeprazole 40 MG capsule Commonly known as: PRILOSEC Take 40 mg by mouth daily.   REFRESH DRY EYE THERAPY OP Apply 1 drop to eye daily as needed.   traMADol 50 MG tablet Commonly known as: ULTRAM Take 0.5 tablets (25 mg total) by mouth every 12 (twelve) hours as needed for severe pain.               Durable Medical Equipment  (From admission, onward)           Start     Ordered   08/08/22 1216  For home use only DME Walker rolling  Once       Question Answer Comment  Walker: With 5 Inch Wheels   Patient needs a walker to treat with the following condition Difficulty in  walking, not elsewhere classified      08/08/22 1215           Allergies  Allergen Reactions   Codeine Palpitations    Follow-up Information     [facility] Follow up.   Specialty: Home Health Services Why: Someone from [facility] will contact you to arrange start date and time for your therapy. Contact information: [address] [place] [address] [phone]         Kinsinger, De Blanch, MD. Go on 09/06/2022.   Specialty: General Surgery Why: 5/23 at 10:10am Please arrive 30 minutes early to complete check in, and bring photo ID and insurance card. Contact information: [address] [place] [address] [phone]                   The results of significant diagnostics from this hospitalization (including imaging, microbiology, ancillary and laboratory) are listed below for reference.    Significant Diagnostic Studies: MR ABDOMEN MRCP W WO CONTAST  Result Date: 08/17/2022 CLINICAL DATA:  Jaundice EXAM: MRI ABDOMEN WITHOUT AND WITH CONTRAST (INCLUDING MRCP) TECHNIQUE: Multiplanar multisequence MR imaging of the abdomen was performed both before and after the administration of intravenous contrast. Heavily T2-weighted images of the biliary and pancreatic ducts were obtained, and three-dimensional MRCP images were rendered by post processing. CONTRAST:  4mL GADAVIST GADOBUTROL 1 MMOL/ML IV SOLN COMPARISON:  CT abdomen and pelvis dated Aug 15, 2022 FINDINGS: Lower chest: Small bilateral pleural effusions. Hepatobiliary: T2 hyperintense lesion of the left hepatic lobe measuring 3.0 x 2.4 cm on series 4, image 26 with peripheral discontinuous enhancement, consistent with benign hemangioma. No suspicious focal liver lesion. Portal vein is patent. Gallbladder wall thickening and sludge. No biliary ductal dilation. Pancreas: No mass, inflammatory changes, or other parenchymal abnormality identified. Spleen:  Within normal limits in size and appearance. Adrenals/Urinary Tract: Bilateral adrenal glands are unremarkable. No hydronephrosis. No suspicious renal lesions. Stomach/Bowel: Left lower quadrant colostomy. Otherwise unremarkable. Vascular/Lymphatic: Suprarenal abdominal aortic aneurysm mural thrombus measuring up to 3.4 cm, unchanged when compared with the prior. No enlarged lymph nodes seen in the abdomen. Other: Small volume abdominal ascites. Diffuse soft tissue anasarca. Musculoskeletal: No suspicious bone lesions identified. IMPRESSION: Somewhat limited exam due to motion artifact. 1. Gallbladder wall thickening and sludge. Wall thickening is possibly reactive, although acute cholecystitis could also have this appearance. If there  is clinical concern, HIDA scan could be performed further evaluation. 2. No biliary ductal dilation. No evidence of choledocholithiasis, although motion artifact limits evaluation. 3. Small bilateral pleural effusions, small volume abdominal ascites, and diffuse soft tissue anasarca. 4. Suprarenal abdominal aortic aneurysm measuring up to 3.4 cm, unchanged when compared with the prior. Recommend follow-up ultrasound every 3 years. This recommendation follows ACR consensus guidelines: White Paper of the ACR Incidental Findings Committee II on Vascular Findings. J Am Coll Radiol 2013; 10:789-794. Electronically Signed   By: Allegra Lai M.D.   On: 08/17/2022 20:41   US LIVER DOPPLER  Result Date: 08/16/2022 CLINICAL DATA:  Hyperbilirubinemia EXAM: DUPLEX ULTRASOUND OF LIVER TECHNIQUE: Color and duplex Doppler ultrasound was performed to evaluate the hepatic in-flow and out-flow vessels. COMPARISON:  CT abdomen pelvis 08/15/2022 FINDINGS: Liver: Normal parenchymal echogenicity. Normal hepatic contour without nodularity. No focal lesion, mass or intrahepatic biliary ductal dilatation. Main Portal Vein size: 0.9 cm Portal Vein Velocities Main Prox:  43 cm/sec Main Mid: 41 cm/sec Main Dist:  38 cm/sec Right:  31 cm/sec Left: 15 cm/sec Hepatic Vein Velocities Right:  17 cm/sec Middle:  27 cm/sec Left:  12 cm/sec IVC: Present and patent with normal respiratory phasicity. Hepatic Artery Velocity:  96 cm/sec Splenic Vein Velocity:  42 cm/sec Spleen: 7.7 cm x 7.2 cm x 3.5 cm with a total volume of 100 cm^3 (411 cm^3 is upper limit normal) Portal Vein Occlusion/Thrombus: No Splenic Vein Occlusion/Thrombus: No Ascites: Trace perihepatic ascites. Varices: None Small amount of gallbladder sludge. Gallbladder otherwise normal in appearance. IMPRESSION: 1. Patent portal vein with appropriate direction of flow. 2. Trace perihepatic ascites. Electronically Signed   By: Acquanetta Belling M.D.   On: 08/16/2022 16:46   CT ABDOMEN  PELVIS W CONTRAST  Result Date: 08/15/2022 CLINICAL DATA:  Sepsis. Status post Hartmann's pouch. Leukocytosis and lactic acidosis EXAM: CT ABDOMEN AND PELVIS WITH CONTRAST TECHNIQUE: Multidetector CT imaging of the abdomen and pelvis was performed using the standard protocol following bolus administration of intravenous contrast. RADIATION DOSE REDUCTION: This exam was performed according to the departmental dose-optimization program which includes automated exposure control, adjustment of the mA and/or kV according to patient size and/or use of iterative reconstruction technique. CONTRAST:  75mL OMNIPAQUE IOHEXOL 350 MG/ML SOLN COMPARISON:  CT 08/10/2022.  Ultrasound 08/12/2022. FINDINGS: Lower chest: Small bilateral pleural effusions are new. Adjacent parenchymal opacities. Atelectasis is favored over infiltrate but recommend follow-up. Breathing motion. Enteric tube in the stomach. Hepatobiliary: Known left hepatic lobe segment 2 hepatic hemangioma. Periportal edema. Patent portal vein. No other space-occupying liver lesion. Gallbladder is mildly distended. Pancreas: Mild atrophy of the pancreas.  No obvious pancreatic mass. Spleen: Lobular contours of the spleen with a central splenule. Adrenals/Urinary Tract: Adrenal glands are preserved. The kidneys are without enhancing mass or collecting system dilatation. Foley catheter in the urinary bladder with some dependent air. Stomach/Bowel: There is diffuse wall thickening of the stomach. Please correlate for gastritis. Again enteric tube with tip along the distal stomach. Small bowel is nondilated. Surgical changes seen. There is a left-sided colostomy. The the transverse and ascending colon has wall thickening but are nondilated. Edema is greatest towards the ostomy site. Rectosigmoid stump is nondilated. Additional mild wall thickening in the area of the rectum as well. Small bowel is nondilated diffusely. No areas of pneumatosis. Vascular/Lymphatic: Diffuse  vascular calcifications. Normal caliber aorta and IVC. Significant atherosclerotic plaque along the aorta at the level of the diaphragm eccentric towards the right. Once again there is some ectasia of the extreme distal descending thoracic aorta of up to 3.4 cm as on prior. Reproductive: Status post hysterectomy. No adnexal masses. Other: Scattered ascites identified. There is some high density material along components of the ascites in the pelvis on the right side as on prior. The amount of ascites is slightly increased from previous. Mesenteric stranding. Increasing anasarca. Midline skin staples along the pelvis. Musculoskeletal: Scattered degenerative changes of the spine and pelvis. IMPRESSION: Again surgical changes seen from diverting colostomy along the left side. Residual rectosigmoid stump. Multifocal areas of wall thickening along the colon as well as of the stomach. Please correlate for colitis and gastritis. No obstruction or free air. Increasing mild ascites.  There is increasing anasarca. Bilateral pleural effusions and adjacent opacities. Atelectasis is favored over infiltrate. Recommend follow-up. Enteric tube.  Foley catheter. Electronically Signed   By: Karen Kays M.D.   On: 08/15/2022 12:12   Overnight EEG with video  Result Date: 08/15/2022 Charlsie Quest, MD     08/15/2022 10:53 AM Patient Name:  Sarah Moore MRN: [id number] Epilepsy Attending: Charlsie Quest Referring Physician/Provider: Milon Dikes, MD Duration: 08/14/2022 1854 to 08/15/2022 4098  Patient history: 72yo F with ams getting eeg to evaluate for seizure  Level of alertness:  awake, asleep  AEDs during EEG study: None  Technical aspects: This EEG study was done with scalp electrodes positioned according to the 10-20 International system of electrode placement. Electrical activity was reviewed with band pass filter of 1-70Hz , sensitivity of 7 uV/mm, display speed of 54mm/sec with a 60Hz  notched filter applied as appropriate.  EEG data were recorded continuously and digitally stored.  Video monitoring was available and reviewed as appropriate.  Description: During awake state, no clear posterior dominant rhythm was seen. Sleep was characterized by sleep spindles (12 -14 Hz), maximal frontocentral region. EEG showed continuous generalized 3-5Hz  theta- delta slowing.  Intermittent generalized periodic discharges with triphasic morphology were also noted at 1-2 Hz, predominantly when awake/stimulated.  Hyperventilation and photic stimulation were not performed.    ABNORMALITY - Periodic discharges with triphasic morphology, generalized ( GPDs) - Continuous slow, generalized  IMPRESSION: This study showed generalized periodic discharges with triphasic morphology most likely toxic-metabolic causes. Additionally there is moderate diffuse encephalopathy.  No seizures were noted. EEG appears to be improving compared to previous day.  Charlsie Quest   EEG adult  Result Date: 08/14/2022 Charlsie Quest, MD     08/14/2022 11:40 AM Patient Name: Sarah Moore MRN: [id number] Epilepsy Attending: Charlsie Quest Referring Physician/Provider: Caryl Pina, MD Date: 08/14/2022 Duration: 26.08 mins Patient history: 72yo F with ams getting eeg to evaluate for seizure Level of alertness:  lethargic AEDs during EEG study: None Technical aspects: This EEG study was done with scalp electrodes positioned according to the 10-20 International system of electrode placement. Electrical activity was reviewed with band pass filter of 1-70Hz , sensitivity of 7 uV/mm, display speed of 46mm/sec with a 60Hz  notched filter applied as appropriate. EEG data were recorded continuously and digitally stored.  Video monitoring was available and reviewed as appropriate. Description: EEG showed continuous generalized 2 to 3 Hz delta slowing.  Generalized periodic discharges with triphasic morphology were also noted at 2 to 2.5 Hz, rarely reaching 3 Hz.  Hyperventilation and  photic stimulation were not performed.   ABNORMALITY - Periodic discharges with triphasic morphology, generalized ( GPDs) - Continuous slow, generalized IMPRESSION: This study showed generalized periodic discharges with triphasic morphology which is on the ictal-interictal continuum.  The morphology and frequency is typically associated with toxic Metabolic causes.  However the frequency reaches 2.5 to 3 Hz which could be interictal.  Consider Ativan challenge and long-term EEG monitoring. Additionally the study is suggestive of moderate to severe diffuse encephalopathy.  Dr. [place] and Dr Wilford Corner were notified. Charlsie Quest   MR BRAIN WO CONTRAST  Result Date: 08/13/2022 CLINICAL DATA:  Neuro deficit, acute, stroke suspected. EXAM: MRI HEAD WITHOUT CONTRAST TECHNIQUE: Multiplanar, multiecho pulse sequences of the brain and surrounding structures were obtained without intravenous contrast. COMPARISON:  Stroke protocol CT/CTA/CTP 08/13/2022. FINDINGS: Brain: Motion degraded study. Within limits of motion artifact, no acute infarct or hemorrhage. No hydrocephalus or extra-axial collection. No mass or midline shift. Vascular: Normal flow voids. Skull and upper cervical spine: Normal marrow signal. Sinuses/Orbits: Unremarkable. Other: None. IMPRESSION: Motion degraded study. Within limits of motion artifact, no acute intracranial process. Electronically Signed   By: Orvan Falconer M.D.   On: 08/13/2022 20:53   CT ANGIO HEAD W OR WO CONTRAST  Result Date: 08/13/2022 CLINICAL DATA:  Neuro deficit, acute, stroke suspected. Right facial droop and aphasia. Unable to move lower extremity. EXAM: CT ANGIOGRAPHY HEAD AND NECK CT PERFUSION BRAIN TECHNIQUE: Multidetector CT imaging of the head and neck was performed using the standard protocol during bolus administration of intravenous contrast. Multiplanar CT image reconstructions and MIPs were obtained to evaluate the vascular anatomy. Carotid stenosis measurements  (when applicable) are obtained utilizing NASCET criteria, using the distal internal carotid diameter as the denominator. Multiphase CT imaging of the brain was performed following IV bolus contrast injection. Subsequent parametric perfusion maps were calculated using RAPID software. RADIATION DOSE REDUCTION: This exam was performed according to the departmental dose-optimization program which includes automated exposure control, adjustment of the mA and/or kV according to patient size and/or use of iterative reconstruction technique. CONTRAST:  OMNIPAQUE IOHEXOL 350 MG/ML SOLN COMPARISON:  Same-day head CT. FINDINGS: CTA NECK FINDINGS Aortic arch: Two-vessel arch configuration with common origin of the right brachiocephalic and left common carotid arteries. Atherosclerotic calcifications of the aortic arch and arch vessel origins. Arch vessel origins are patent. Right carotid system: No evidence of dissection, stenosis (50% or greater) or occlusion. Mild calcified plaque along the right carotid bulb and proximal right cervical ICA. Left carotid system: No evidence of dissection, stenosis (50% or greater) or occlusion. Mild calcified plaque along the left carotid bulb and proximal left cervical ICA. Vertebral arteries: Left dominant.  Patent to the skull base. Skeleton: No suspicious bone lesions. Other neck: Unremarkable. Upper chest: Small bilateral pleural effusions. Review of the MIP images confirms the above findings CTA HEAD FINDINGS Anterior circulation: Calcified plaque along the carotid siphons without hemodynamically significant stenosis. The proximal ACAs and MCAs are patent without stenosis or aneurysm. Distal branches are symmetric. Posterior circulation: The right vertebral artery functionally terminates in PICA. The intracranial portion of the left vertebral artery and basilar artery are patent without stenosis or aneurysm. The SCAs, AICAs and PICAs are patent proximally. The PCAs are patent  proximally without stenosis or aneurysm. Distal branches are symmetric. Venous sinuses: As permitted by contrast timing, patent. Anatomic variants: None. Review of the MIP images confirms the above findings CT Brain Perfusion Findings: ASPECTS: 10 CBF (<30%) Volume: 0mL Perfusion (Tmax>6.0s) volume: 0mL Mismatch Volume: 0mL Infarction Location: Not applicable. IMPRESSION: 1. No large vessel occlusion or hemodynamically significant stenosis in the head or neck vessels. 2. No evidence of core infarct or penumbra on CT perfusion. 3. Small bilateral pleural effusions. 4. Aortic Atherosclerosis (ICD10-I70.0). Electronically Signed   By: Orvan Falconer M.D.   On: 08/13/2022 17:30   CT ANGIO NECK W OR WO CONTRAST  Result Date: 08/13/2022 CLINICAL DATA:  Neuro deficit, acute, stroke suspected. Right facial droop and aphasia. Unable to move lower extremity. EXAM: CT ANGIOGRAPHY HEAD AND NECK CT PERFUSION BRAIN TECHNIQUE: Multidetector CT imaging of the head and neck was performed using the standard protocol during bolus administration of intravenous contrast. Multiplanar CT image reconstructions and MIPs were obtained to evaluate the vascular anatomy. Carotid stenosis measurements (when applicable) are obtained utilizing NASCET criteria, using the distal internal carotid diameter as the denominator. Multiphase CT imaging of the brain was performed following IV bolus contrast injection. Subsequent parametric perfusion maps were calculated using RAPID software. RADIATION DOSE REDUCTION: This exam was performed according to the departmental dose-optimization program which includes automated exposure control, adjustment of the mA and/or kV according to patient size and/or use of iterative reconstruction technique. CONTRAST:  OMNIPAQUE IOHEXOL 350 MG/ML  SOLN COMPARISON:  Same-day head CT. FINDINGS: CTA NECK FINDINGS Aortic arch: Two-vessel arch configuration with common origin of the right brachiocephalic and left  common carotid arteries. Atherosclerotic calcifications of the aortic arch and arch vessel origins. Arch vessel origins are patent. Right carotid system: No evidence of dissection, stenosis (50% or greater) or occlusion. Mild calcified plaque along the right carotid bulb and proximal right cervical ICA. Left carotid system: No evidence of dissection, stenosis (50% or greater) or occlusion. Mild calcified plaque along the left carotid bulb and proximal left cervical ICA. Vertebral arteries: Left dominant.  Patent to the skull base. Skeleton: No suspicious bone lesions. Other neck: Unremarkable. Upper chest: Small bilateral pleural effusions. Review of the MIP images confirms the above findings CTA HEAD FINDINGS Anterior circulation: Calcified plaque along the carotid siphons without hemodynamically significant stenosis. The proximal ACAs and MCAs are patent without stenosis or aneurysm. Distal branches are symmetric. Posterior circulation: The right vertebral artery functionally terminates in PICA. The intracranial portion of the left vertebral artery and basilar artery are patent without stenosis or aneurysm. The SCAs, AICAs and PICAs are patent proximally. The PCAs are patent proximally without stenosis or aneurysm. Distal branches are symmetric. Venous sinuses: As permitted by contrast timing, patent. Anatomic variants: None. Review of the MIP images confirms the above findings CT Brain Perfusion Findings: ASPECTS: 10 CBF (<30%) Volume: 0mL Perfusion (Tmax>6.0s) volume: 0mL Mismatch Volume: 0mL Infarction Location: Not applicable. IMPRESSION: 1. No large vessel occlusion or hemodynamically significant stenosis in the head or neck vessels. 2. No evidence of core infarct or penumbra on CT perfusion. 3. Small bilateral pleural effusions. 4. Aortic Atherosclerosis (ICD10-I70.0). Electronically Signed   By: Orvan Falconer M.D.   On: 08/13/2022 17:30   CT CEREBRAL PERFUSION W CONTRAST  Result Date:  08/13/2022 CLINICAL DATA:  Neuro deficit, acute, stroke suspected. Right facial droop and aphasia. Unable to move lower extremity. EXAM: CT ANGIOGRAPHY HEAD AND NECK CT PERFUSION BRAIN TECHNIQUE: Multidetector CT imaging of the head and neck was performed using the standard protocol during bolus administration of intravenous contrast. Multiplanar CT image reconstructions and MIPs were obtained to evaluate the vascular anatomy. Carotid stenosis measurements (when applicable) are obtained utilizing NASCET criteria, using the distal internal carotid diameter as the denominator. Multiphase CT imaging of the brain was performed following IV bolus contrast injection. Subsequent parametric perfusion maps were calculated using RAPID software. RADIATION DOSE REDUCTION: This exam was performed according to the departmental dose-optimization program which includes automated exposure control, adjustment of the mA and/or kV according to patient size and/or use of iterative reconstruction technique. CONTRAST:  OMNIPAQUE IOHEXOL 350 MG/ML SOLN COMPARISON:  Same-day head CT. FINDINGS: CTA NECK FINDINGS Aortic arch: Two-vessel arch configuration with common origin of the right brachiocephalic and left common carotid arteries. Atherosclerotic calcifications of the aortic arch and arch vessel origins. Arch vessel origins are patent. Right carotid system: No evidence of dissection, stenosis (50% or greater) or occlusion. Mild calcified plaque along the right carotid bulb and proximal right cervical ICA. Left carotid system: No evidence of dissection, stenosis (50% or greater) or occlusion. Mild calcified plaque along the left carotid bulb and proximal left cervical ICA. Vertebral arteries: Left dominant.  Patent to the skull base. Skeleton: No suspicious bone lesions. Other neck: Unremarkable. Upper chest: Small bilateral pleural effusions. Review of the MIP images confirms the above findings CTA HEAD FINDINGS Anterior  circulation: Calcified plaque along the carotid siphons without hemodynamically significant stenosis. The proximal ACAs and MCAs  are patent without stenosis or aneurysm. Distal branches are symmetric. Posterior circulation: The right vertebral artery functionally terminates in PICA. The intracranial portion of the left vertebral artery and basilar artery are patent without stenosis or aneurysm. The SCAs, AICAs and PICAs are patent proximally. The PCAs are patent proximally without stenosis or aneurysm. Distal branches are symmetric. Venous sinuses: As permitted by contrast timing, patent. Anatomic variants: None. Review of the MIP images confirms the above findings CT Brain Perfusion Findings: ASPECTS: 10 CBF (<30%) Volume: 0mL Perfusion (Tmax>6.0s) volume: 0mL Mismatch Volume: 0mL Infarction Location: Not applicable. IMPRESSION: 1. No large vessel occlusion or hemodynamically significant stenosis in the head or neck vessels. 2. No evidence of core infarct or penumbra on CT perfusion. 3. Small bilateral pleural effusions. 4. Aortic Atherosclerosis (ICD10-I70.0). Electronically Signed   By: Orvan Falconer M.D.   On: 08/13/2022 17:30   CT HEAD CODE STROKE WO CONTRAST  Result Date: 08/13/2022 CLINICAL DATA:  Code stroke. Neuro deficit, acute, stroke suspected. EXAM: CT HEAD WITHOUT CONTRAST TECHNIQUE: Contiguous axial images were obtained from the base of the skull through the vertex without intravenous contrast. RADIATION DOSE REDUCTION: This exam was performed according to the departmental dose-optimization program which includes automated exposure control, adjustment of the mA and/or kV according to patient size and/or use of iterative reconstruction technique. COMPARISON:  None Available. FINDINGS: Brain: No acute intracranial hemorrhage. Gray-white differentiation is preserved. No hydrocephalus or extra-axial collection. No mass effect or midline shift. Vascular: No hyperdense vessel or unexpected  calcification. Skull: No calvarial fracture or suspicious bone lesion. Skull base is unremarkable. Sinuses/Orbits: Unremarkable. Other: None. ASPECTS ([place] Stroke Program Early CT Score) - Ganglionic level infarction (caudate, lentiform nuclei, internal capsule, insula, M1-M3 cortex): 7 - Supraganglionic infarction (M4-M6 cortex): 3 Total score (0-10 with 10 being normal): 10 IMPRESSION: No acute intracranial hemorrhage or evidence of evolving large vessel territory infarct. ASPECT score is 10. Code stroke imaging results were communicated on 08/13/2022 at 4:52 pm to provider Dr. Otelia Limes via secure text paging. Electronically Signed   By: Orvan Falconer M.D.   On: 08/13/2022 16:54   DG CHEST PORT 1 VIEW  Result Date: 08/12/2022 CLINICAL DATA:  Shortness of breath EXAM: PORTABLE CHEST 1 VIEW COMPARISON:  August 10, 2022 FINDINGS: Stable right central line. No pneumothorax. The cardiomediastinal silhouette is stable. Lungs are clear. No other abnormalities. IMPRESSION: No active disease. Electronically Signed   By: Gerome Sam III M.D.   On: 08/12/2022 13:01   US Abdomen Limited RUQ (LIVER/GB)  Result Date: 08/12/2022 CLINICAL DATA:  Abdominal pain for a week. Recent abdominal surgery. EXAM: ULTRASOUND ABDOMEN LIMITED RIGHT UPPER QUADRANT COMPARISON:  CT scan of the abdomen and pelvis August 06, 2022 and August 10, 2022 FINDINGS: Gallbladder: The gallbladder wall is borderline in thickness measuring 3 mm. Pericholecystic fluid identified. Sludge identified in the gallbladder. No stones noted. Common bile duct: Diameter: 4 mm Liver: Coarsened echotexture. Known hemangioma measuring 2.1 cm in the left hepatic lobe. No other abnormalities in the liver. Portal vein is patent on color Doppler imaging with normal direction of blood flow towards the liver. Other: A right pleural effusion is noted. Free fluid is identified Morison's pouch. IMPRESSION: 1. Sludge in the gallbladder. The gallbladder wall is  borderline. No stones. Pericholecystic fluid, a nonspecific finding in the postoperative setting. 2. Known hemangioma in the left hepatic lobe. Known hepatic steatosis. 3. Right pleural effusion. 4. Free fluid in Morison's pouch could be postoperative given history. Electronically Signed  By: Gerome Sam III M.D.   On: 08/12/2022 13:01   ECHOCARDIOGRAM COMPLETE  Result Date: 08/11/2022    ECHOCARDIOGRAM REPORT   Patient Name:   Sarah Moore Date of Exam: 08/11/2022 Medical Rec #:  [id number]     Height:       60.0 in Accession #:    [id number]    Weight:       112.0 lb Date of Birth:  Jan 30, 1951    BSA:          1.459 m Patient Age:    71 years      BP:           116/66 mmHg Patient Gender: F             HR:           87 bpm. Exam Location:  Inpatient Procedure: 2D Echo, Cardiac Doppler and Color Doppler Indications:    Shock  History:        Patient has prior history of Echocardiogram examinations, most                 recent 08/01/2021. Risk Factors:Dyslipidemia.  Sonographer:    Wallie Char Referring Phys: [id number] MURALI RAMASWAMY IMPRESSIONS  1. Left ventricular ejection fraction, by estimation, is 60 to 65%. The left ventricle has normal function. The left ventricle has no regional wall motion abnormalities. Left ventricular diastolic parameters are consistent with Grade I diastolic dysfunction (impaired relaxation).  2. Right ventricular systolic function is normal. The right ventricular size is normal. There is moderately elevated pulmonary artery systolic pressure. The estimated right ventricular systolic pressure is 50.3 mmHg.  3. The mitral valve is normal in structure. No evidence of mitral valve regurgitation. No evidence of mitral stenosis.  4. The aortic valve is normal in structure. Aortic valve regurgitation is not visualized. No aortic stenosis is present.  5. The inferior vena cava is dilated in size with <50% respiratory variability, suggesting right atrial pressure of 15 mmHg.  Comparison(s): Prior images reviewed side by side. The left ventricular function is unchanged. Increased right atrial pressure and pulmonary artery pressure is now seen, but right ventricular function remains normal. FINDINGS  Left Ventricle: Left ventricular ejection fraction, by estimation, is 60 to 65%. The left ventricle has normal function. The left ventricle has no regional wall motion abnormalities. The left ventricular internal cavity size was normal in size. There is  no left ventricular hypertrophy. Left ventricular diastolic parameters are consistent with Grade I diastolic dysfunction (impaired relaxation). Indeterminate filling pressures. Right Ventricle: The right ventricular size is normal. No increase in right ventricular wall thickness. Right ventricular systolic function is normal. There is moderately elevated pulmonary artery systolic pressure. The tricuspid regurgitant velocity is 2.97 m/s, and with an assumed right atrial pressure of 15 mmHg, the estimated right ventricular systolic pressure is 50.3 mmHg. Left Atrium: Left atrial size was normal in size. Right Atrium: Right atrial size was normal in size. Pericardium: There is no evidence of pericardial effusion. Mitral Valve: The mitral valve is normal in structure. No evidence of mitral valve regurgitation. No evidence of mitral valve stenosis. MV peak gradient, 7.7 mmHg. The mean mitral valve gradient is 3.0 mmHg. Tricuspid Valve: The tricuspid valve is normal in structure. Tricuspid valve regurgitation is mild . No evidence of tricuspid stenosis. Aortic Valve: The aortic valve is normal in structure. Aortic valve regurgitation is not visualized. No aortic stenosis is present. Aortic valve mean gradient measures 4.0 mmHg.  Aortic valve peak gradient measures 8.2 mmHg. Aortic valve area, by VTI measures 2.06 cm. Pulmonic Valve: The pulmonic valve was normal in structure. Pulmonic valve regurgitation is mild. No evidence of pulmonic stenosis.  Aorta: The aortic root is normal in size and structure. Venous: The inferior vena cava is dilated in size with less than 50% respiratory variability, suggesting right atrial pressure of 15 mmHg. IAS/Shunts: No atrial level shunt detected by color flow Doppler.  LEFT VENTRICLE PLAX 2D LVIDd:         3.30 cm     Diastology LVIDs:         2.30 cm     LV e' medial:    7.19 cm/s LV PW:         1.10 cm     LV E/e' medial:  12.1 LV IVS:        1.10 cm     LV e' lateral:   8.97 cm/s LVOT diam:     1.90 cm     LV E/e' lateral: 9.7 LV SV:         51 LV SV Index:   35 LVOT Area:     2.84 cm  LV Volumes (MOD) LV vol d, MOD A2C: 57.6 ml LV vol d, MOD A4C: 68.6 ml LV vol s, MOD A2C: 26.1 ml LV vol s, MOD A4C: 27.8 ml LV SV MOD A2C:     31.5 ml LV SV MOD A4C:     68.6 ml LV SV MOD BP:      36.3 ml RIGHT VENTRICLE             IVC RV Basal diam:  3.00 cm     IVC diam: 2.50 cm RV S prime:     14.50 cm/s TAPSE (M-mode): 2.3 cm LEFT ATRIUM             Index        RIGHT ATRIUM           Index LA diam:        3.10 cm 2.12 cm/m   RA Area:     10.70 cm LA Vol (A2C):   23.2 ml 15.90 ml/m  RA Volume:   21.00 ml  14.39 ml/m LA Vol (A4C):   20.9 ml 14.33 ml/m LA Biplane Vol: 22.1 ml 15.15 ml/m  AORTIC VALVE AV Area (Vmax):    2.03 cm AV Area (Vmean):   1.97 cm AV Area (VTI):     2.06 cm AV Vmax:           143.50 cm/s AV Vmean:          97.150 cm/s AV VTI:            0.249 m AV Peak Grad:      8.2 mmHg AV Mean Grad:      4.0 mmHg LVOT Vmax:         102.50 cm/s LVOT Vmean:        67.550 cm/s LVOT VTI:          0.180 m LVOT/AV VTI ratio: 0.72  AORTA Ao Root diam: 2.90 cm Ao Asc diam:  3.10 cm MITRAL VALVE                TRICUSPID VALVE MV Area (PHT): 3.28 cm     TR Peak grad:   35.3 mmHg MV Area VTI:   1.89 cm     TR Vmax:  297.00 cm/s MV Peak grad:  7.7 mmHg MV Mean grad:  3.0 mmHg     SHUNTS MV Vmax:       1.39 m/s     Systemic VTI:  0.18 m MV Vmean:      77.6 cm/s    Systemic Diam: 1.90 cm MV Decel Time: 231 msec MV E  velocity: 87.20 cm/s MV A velocity: 120.00 cm/s MV E/A ratio:  0.73 Mihai Croitoru MD Electronically signed by Thurmon Fair MD Signature Date/Time: 08/11/2022/11:18:35 AM    Final    CT ABDOMEN PELVIS W CONTRAST  Result Date: 08/10/2022 CLINICAL DATA:  72 year old with postop abdominal pain. History of colectomy and colostomy. EXAM: CT ABDOMEN AND PELVIS WITH CONTRAST TECHNIQUE: Multidetector CT imaging of the abdomen and pelvis was performed using the standard protocol following bolus administration of intravenous contrast. RADIATION DOSE REDUCTION: This exam was performed according to the departmental dose-optimization program which includes automated exposure control, adjustment of the mA and/or kV according to patient size and/or use of iterative reconstruction technique. CONTRAST:  80mL OMNIPAQUE IOHEXOL 300 MG/ML  SOLN COMPARISON:  CT abdomen and pelvis 08/06/2022 FINDINGS: Lower chest: 3 mm nodular density along the right major fissure on the image 16/6 was probably present in 2014 and suggestive for a benign finding. Otherwise, the lung bases are clear. Hepatobiliary: High-density material in the gallbladder without distension or inflammatory changes. Main portal venous system is patent. No biliary dilatation. Stable appearance of the liver. Patient has a hemangioma involving the left hepatic lobe which is poorly characterized on this exam and markedly decreased in size compared to the exam in 2014. Pancreas: Unremarkable. No pancreatic ductal dilatation or surrounding inflammatory changes. Spleen: Normal in size without focal abnormality. Adrenals/Urinary Tract: Normal adrenal glands. Normal appearance of both kidneys without hydronephrosis. Normal appearance of the urinary bladder. Stomach/Bowel: Partial colectomy with resection of at least a portion of the sigmoid colon. Patient has a Hartmann's pouch. There is a colostomy involving the descending colon. Mild distention of the stomach with air-fluid  level. No small bowel distention or obstruction. Mild wall thickening along the proximal aspect of the Hartmann's pouch. There appears to be a normal appendix. Vascular/Lymphatic: Aorta at the hiatus measures up to 3.4 cm with eccentric plaque occupying approximately 50% of the lumen. Diffuse atherosclerotic calcifications involving the abdominal aorta. No significant lymph node enlargement in the abdomen or pelvis. Reproductive: Prostate is unremarkable. Other: Small amount of slightly dense fluid along the right side of the pelvis on image 63/2. Suspect this represents postoperative fluid with blood products. Small air-fluid collections near the Hartmann's pouch on image 62/2 are likely associated with small bowel loops. Multiple pockets of gas along the lateral left abdomen and upper pelvic region are compatible with recent postoperative changes. Small amount of free air in the upper abdomen. No suspicious postoperative fluid collections. Mild mesenteric edema. Surgical skin staples along the anterior abdominal wall. Musculoskeletal: No acute bone abnormality. IMPRESSION: 1. Expected postoperative changes following a partial colectomy and colostomy creation. Multiple small pockets of air within the abdomen are compatible with recent surgery. Small amount of slightly dense fluid in the pelvis could represent blood products. No suspicious postoperative fluid collections at this time. 2. 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 Recommend follow-up ultrasound every 3 years. This recommendation follows ACR consensus guidelines: White Paper of the ACR Incidental Findings Committee II on Vascular Findings. J Am Coll Radiol 2013; 16:109-604. 3.  Aortic Atherosclerosis (ICD10-I70.0). Electronically Signed  By: Richarda Overlie M.D.   On: 08/10/2022 17:23   DG CHEST PORT 1 VIEW  Result Date: 08/10/2022 CLINICAL DATA:  Central line placement. EXAM: PORTABLE CHEST 1 VIEW COMPARISON:  One-view chest  x-ray 08/10/2022 at 1:55 p.m. FINDINGS: Heart size is normal. A right IJ line has been placed. The tip is at the. Rule junction. No pneumothorax is present. Lung volumes are low. No edema or effusion is present. No focal airspace disease is present. The visualized soft tissues and bony thorax are unremarkable. IMPRESSION: Interval placement of right IJ line. The tip is at the cavoatrial junction. No pneumothorax. Electronically Signed   By: Marin Roberts M.D.   On: 08/10/2022 16:22   DG CHEST PORT 1 VIEW  Result Date: 08/10/2022 CLINICAL DATA:  Respiratory failure EXAM: PORTABLE CHEST 1 VIEW COMPARISON:  05/13/2021 FINDINGS: Cardiac size is within normal limits. Thoracic aorta is ectatic. There are no signs of pulmonary edema or focal pulmonary consolidation. There is no pleural effusion or pneumothorax. IMPRESSION: No active disease. Electronically Signed   By: Ernie Avena M.D.   On: 08/10/2022 14:10   CT ABDOMEN PELVIS W CONTRAST  Result Date: 08/06/2022 CLINICAL DATA:  Hematuria, suspected bowel obstruction EXAM: CT ABDOMEN AND PELVIS WITH CONTRAST TECHNIQUE: Multidetector CT imaging of the abdomen and pelvis was performed using the standard protocol following bolus administration of intravenous contrast. RADIATION DOSE REDUCTION: This exam was performed according to the departmental dose-optimization program which includes automated exposure control, adjustment of the mA and/or kV according to patient size and/or use of iterative reconstruction technique. CONTRAST:  80mL OMNIPAQUE IOHEXOL 300 MG/ML  SOLN COMPARISON:  08/02/2022, 07/02/2012 FINDINGS: Lower chest: No acute pleural or parenchymal lung disease. Hepatobiliary: Involution of the cavernous hemangioma seen within the left lobe liver on prior exam. This now measures up to 2.3 cm in maximal dimension, previously measuring 5.6 cm. No acute liver abnormality. The gallbladder is unremarkable. No biliary duct dilation. Pancreas:  Moderate pancreatic atrophy. No acute inflammatory changes or pancreatic duct dilation. Spleen: Normal in size without focal abnormality. Adrenals/Urinary Tract: The kidneys enhance normally and symmetrically. No urinary tract calculi or obstructive uropathy. Bladder is decompressed, the bladder is minimally distended, without focal abnormality. The adrenals are unremarkable. Stomach/Bowel: There is a colo-colic intussusception beginning at the junction of the descending and sigmoid colon. Lead point is likely a large intraluminal lipoma, which has increased in size since prior study measuring 6.4 x 4.5 cm on today's exam. There is mild mural thickening of the intussusceptum, without frank evidence of bowel ischemia. Moderate gaseous distension of the colon proximal to the intussusception could reflect partial functional colonic obstruction. Gas is seen within the rectal vault. There is no evidence of small-bowel obstruction. Normal appendix right lower quadrant. Vascular/Lymphatic: There is marked eccentric atheromatous plaque within the aorta at the diaphragmatic hiatus, with approximately 50% stenosis in that region. Atherosclerosis throughout the remainder of the visualized thoracoabdominal aorta without evidence of aneurysm or dissection. No pathologic adenopathy. Reproductive: Status post hysterectomy. No adnexal masses. Other: No free fluid or free intraperitoneal gas. No abdominal wall hernia. Musculoskeletal: There are no acute or destructive bony lesions. Reconstructed images demonstrate no additional findings. IMPRESSION: 1. Colo-colic intussusception at the junction of the descending and sigmoid colon, likely due to a large intraluminal lipoma serving as the lead point. There is mild mural thickening of the intussusceptum, without frank evidence of bowel ischemia. Likely partial functional colonic obstruction with moderate gaseous distension of the proximal colon. Gas  is seen within the rectal vault.  Surgical consultation recommended. 2. Aortic Atherosclerosis (ICD10-I70.0). Moderate eccentric plaque located at the diaphragmatic hiatus, with approximately 50% narrowing of the aortic lumen in that region. 3. Significant involution of the cavernous hemangioma within the left lobe liver as above. Critical Value/emergent results were called by telephone at the time of interpretation on 08/06/2022 at 5:06 pm to provider [facility] [place] , who verbally acknowledged these results. Electronically Signed   By: Sharlet Salina M.D.   On: 08/06/2022 17:08   DG Abd 2 Views  Result Date: 08/02/2022 CLINICAL DATA:  Abdominal pain. EXAM: ABDOMEN - 2 VIEW COMPARISON:  None Available. FINDINGS: The bowel gas pattern is normal without gaseous distention. No free intraperitoneal air. No radio-opaque calculi or other significant radiographic abnormality are seen. Increased stool noted consistent with constipation. IMPRESSION: Constipation.  No evidence of obstruction or free air. Electronically Signed   By: Layla Maw M.D.   On: 08/02/2022 11:21    Microbiology: No results found for this or any previous visit (from the past 240 hour(s)).   Labs: Basic Metabolic Panel: Recent Labs  Lab 08/21/22 0112 08/23/22 0118 08/24/22 0308 08/27/22 0016  NA 137 138 135 137  K 4.3 4.5 4.2 3.5  CL 110 109 107 107  CO2 23 23 24 22   GLUCOSE 119* 127* 128* 90  BUN 9 13 10 14   CREATININE 0.52 0.50 0.47 0.50  CALCIUM 8.8* 9.1 9.1 9.5  MG  --  1.9  --   --   PHOS  --  3.9  --   --    Liver Function Tests: Recent Labs  Lab 08/21/22 0112 08/23/22 0118 08/24/22 0308 08/27/22 0016  AST 107* 136* 136* 216*  ALT 234* 191* 168* 190*  ALKPHOS 183* 228* 231* 271*  BILITOT 11.6* 9.2* 8.3* 8.3*  PROT 5.6* 6.0* 6.0* 7.0  ALBUMIN 1.9* 1.9* 1.9* 2.1*   No results for input(s): "LIPASE", "AMYLASE" in the last 168 hours. No results for input(s): "AMMONIA" in the last 168 hours. CBC: Recent Labs  Lab 08/21/22 0112  08/22/22 0008 08/23/22 0118 08/24/22 0308 08/27/22 0016  WBC 12.1* 9.8 7.6 6.9 4.9  NEUTROABS  --   --   --   --  1.8  HGB 8.7* 8.9* 9.1* 8.7* 8.8*  HCT 27.2* 27.5* 28.1* 25.5* 27.1*  MCV 94.8 94.8 95.9 92.7 96.8  PLT 141* 146* 153 172 220   Cardiac Enzymes: No results for input(s): "CKTOTAL", "CKMB", "CKMBINDEX", "TROPONINI" in the last 168 hours. BNP: BNP (last 3 results) No results for input(s): "BNP" in the last 8760 hours.  ProBNP (last 3 results) No results for input(s): "PROBNP" in the last 8760 hours.  CBG: Recent Labs  Lab 08/26/22 1137 08/26/22 1731 08/26/22 2157 08/27/22 0033 08/27/22 0551  GLUCAP 104* 89 93 95 87       Signed:  Rhetta Mura MD   [facility] 08/27/2022, 7:57 AM

## 2022-08-27 NOTE — Progress Notes (Signed)
Physical Therapy Treatment Patient Details Name: Sarah Moore MRN: [id number] DOB: 03-01-1951 Today's Date: 08/27/2022   History of Present Illness 72 y.o. female presents to ED on 4/22 with complaints of abdominal pain. Dx of colonic intussuseption, s/p partial colectomy with end colostomy 08/07/22. Course complicated by confusion and hypotension starting 4/26, transfer to ICU, code stroke (negative), anemia, hyperbilirubinemia. PMH includes HLD, remote DVT, OA, GERD.    PT Comments    Pt received in supine and agreeable to session with encouragement and with family present throughout. Pt able to pivot to [facility] and perform pericare in standing with min guard. Pt was able to tolerate a slightly increased gait distance, however continues to be limited by impaired activity tolerance. Pt with improved problem solving and response to cues, but continues to require increased time for all mobility tasks. Pt continues to benefit from PT services to progress toward functional mobility goals.    Recommendations for follow up therapy are one component of a multi-disciplinary discharge planning process, led by the attending physician.  Recommendations may be updated based on patient status, additional functional criteria and insurance authorization.     Assistance Recommended at Discharge Frequent or constant Supervision/Assistance  Patient can return home with the following Two people to help with walking and/or transfers;Two people to help with bathing/dressing/bathroom   Equipment Recommendations  Rolling walker (2 wheels)    Recommendations for Other Services       Precautions / Restrictions Precautions Precautions: Fall Precaution Comments: colostomy, abdominal surgery, cortrak Restrictions Weight Bearing Restrictions: No     Mobility  Bed Mobility Overal bed mobility: Needs Assistance Bed Mobility: Supine to Sit     Supine to sit: Supervision, HOB elevated     General bed mobility  comments: increased time    Transfers Overall transfer level: Needs assistance Equipment used: Rolling walker (2 wheels) Transfers: Sit to/from Stand, Bed to chair/wheelchair/BSC Sit to Stand: Min guard   Step pivot transfers: Min guard       General transfer comment: Pt able to stand from EOB and BSC with min guard and cues for hand placement. Pt pivoting from EOB to [facility] with min guard for safety.    Ambulation/Gait Ambulation/Gait assistance: Min guard Gait Distance (Feet): 15 Feet Assistive device: Rolling walker (2 wheels) Gait Pattern/deviations: Step-through pattern, Decreased stride length, Shuffle, Narrow base of support, Trunk flexed       General Gait Details: Pt demonstrating slow steps with cues for RW proximity and upright posture.     Balance Overall balance assessment: Needs assistance Sitting-balance support: No upper extremity supported, Feet supported Sitting balance-Leahy Scale: Fair Sitting balance - Comments: sitting EOB   Standing balance support: During functional activity, Bilateral upper extremity supported, Reliant on assistive device for balance Standing balance-Leahy Scale: Fair Standing balance comment: with RW support. Pt able to perform pericare in standing                            Cognition Arousal/Alertness: Awake/alert Behavior During Therapy: WFL for tasks assessed/performed Overall Cognitive Status: Within Functional Limits for tasks assessed                                 General Comments: pt's daughter and niece present and supportive throughout session.        Exercises      General Comments  Pertinent Vitals/Pain Pain Assessment Pain Assessment: No/denies pain     PT Goals (current goals can now be found in the care plan section) Acute Rehab PT Goals Patient Stated Goal: get stronger, then go home PT Goal Formulation: With patient Time For Goal Achievement: 08/31/22 Potential to  Achieve Goals: Good Progress towards PT goals: Progressing toward goals    Frequency    Min 4X/week      PT Plan Current plan remains appropriate       AM-PAC PT "6 Clicks" Mobility   Outcome Measure  Help needed turning from your back to your side while in a flat bed without using bedrails?: A Little Help needed moving from lying on your back to sitting on the side of a flat bed without using bedrails?: A Little Help needed moving to and from a bed to a chair (including a wheelchair)?: A Little Help needed standing up from a chair using your arms (e.g., wheelchair or bedside chair)?: A Little Help needed to walk in hospital room?: A Little Help needed climbing 3-5 steps with a railing? : A Lot 6 Click Score: 17    End of Session Equipment Utilized During Treatment: Gait belt Activity Tolerance: Patient limited by fatigue Patient left: in chair;with family/visitor present;with call bell/phone within reach Nurse Communication: Mobility status PT Visit Diagnosis: Difficulty in walking, not elsewhere classified (R26.2);Pain     Time: 0981-1914 PT Time Calculation (min) (ACUTE ONLY): 19 min  Charges:  $Therapeutic Activity: 8-22 mins                     Johny Shock, PTA Acute Rehabilitation Services Secure Chat Preferred  [phone]    Johny Shock 08/27/2022, 12:41 PM

## 2022-08-27 NOTE — Care Management Important Message (Signed)
Important Message  Patient Details  Name: CASSADI MOFFA MRN: [id number] Date of Birth: [birth year]   Medicare Important Message Given:  Yes     Sherilyn Banker 08/27/2022, 4:02 PM

## 2022-08-28 DIAGNOSIS — R2681 Unsteadiness on feet: Secondary | ICD-10-CM | POA: Diagnosis not present

## 2022-08-28 DIAGNOSIS — R2689 Other abnormalities of gait and mobility: Secondary | ICD-10-CM | POA: Diagnosis not present

## 2022-08-28 DIAGNOSIS — I10 Essential (primary) hypertension: Secondary | ICD-10-CM | POA: Diagnosis not present

## 2022-08-28 DIAGNOSIS — G9341 Metabolic encephalopathy: Secondary | ICD-10-CM | POA: Diagnosis not present

## 2022-08-28 DIAGNOSIS — Z48815 Encounter for surgical aftercare following surgery on the digestive system: Secondary | ICD-10-CM | POA: Diagnosis not present

## 2022-08-28 DIAGNOSIS — K769 Liver disease, unspecified: Secondary | ICD-10-CM | POA: Diagnosis not present

## 2022-08-28 DIAGNOSIS — R41841 Cognitive communication deficit: Secondary | ICD-10-CM | POA: Diagnosis not present

## 2022-08-28 DIAGNOSIS — M6281 Muscle weakness (generalized): Secondary | ICD-10-CM | POA: Diagnosis not present

## 2022-08-28 DIAGNOSIS — D62 Acute posthemorrhagic anemia: Secondary | ICD-10-CM | POA: Diagnosis not present

## 2022-08-28 DIAGNOSIS — R1312 Dysphagia, oropharyngeal phase: Secondary | ICD-10-CM | POA: Diagnosis not present

## 2022-08-28 DIAGNOSIS — Z433 Encounter for attention to colostomy: Secondary | ICD-10-CM | POA: Diagnosis not present

## 2022-08-28 DIAGNOSIS — K9131 Postprocedural partial intestinal obstruction: Secondary | ICD-10-CM | POA: Diagnosis not present

## 2022-08-28 DIAGNOSIS — K219 Gastro-esophageal reflux disease without esophagitis: Secondary | ICD-10-CM | POA: Diagnosis not present

## 2022-08-28 DIAGNOSIS — R531 Weakness: Secondary | ICD-10-CM | POA: Diagnosis not present

## 2022-08-28 DIAGNOSIS — Z7401 Bed confinement status: Secondary | ICD-10-CM | POA: Diagnosis not present

## 2022-08-28 DIAGNOSIS — K5900 Constipation, unspecified: Secondary | ICD-10-CM | POA: Diagnosis not present

## 2022-08-28 DIAGNOSIS — I69354 Hemiplegia and hemiparesis following cerebral infarction affecting left non-dominant side: Secondary | ICD-10-CM | POA: Diagnosis not present

## 2022-08-28 LAB — GLUCOSE, CAPILLARY
Glucose-Capillary: 102 mg/dL — ABNORMAL HIGH (ref 70–99)
Glucose-Capillary: 106 mg/dL — ABNORMAL HIGH (ref 70–99)
Glucose-Capillary: 116 mg/dL — ABNORMAL HIGH (ref 70–99)

## 2022-08-28 NOTE — Discharge Summary (Signed)
Physician Discharge Summary  Sarah Moore ZOX:[id number] DOB: 1950-06-13 DOA: 08/06/2022  Seen examined and looks well for d/c today--no new changes to plan See updated exam  PCP: Norm Salt, PA  Admit date: 08/06/2022 Discharge date: 08/28/2022  Time spent: 47 minutes  Recommendations for Outpatient Follow-up:  Chem-12 CBC about 1 week at skilled facility-does have underlying steatosis and will need probable further follow-up with Dr. Elnoria Howard in the outpatient setting for management-has a known hepatic hemangioma in addition which needs monitoring Needs ultrasound of AAA in about 3 years time Mandatory lactulose on discharge Ostomy care as per wound nurse patient is quite comfortable with management and is learning how to use the same  Discharge Diagnoses:  MAIN problem for hospitalization   Intussusception status post surgery Shock liver Steatosis requiring lactulose Anemia of acute blood loss with transfusion Dysphagia Hepatic hemangioma AAA   Please see below for itemized issues addressed in HOpsital- refer to other progress notes for clarity if needed  Discharge Condition: Fair  Diet recommendation: Heart healthy  Filed Weights   08/24/22 0500 08/26/22 0405 08/28/22 0428  Weight: 55.7 kg 52.4 kg 47.4 kg    History of present illness:  72 year old female Remote DVT not on anticoagulation, HLD chronic constipation/anal fissure status post flex sig 4/9 follows with Dr. Daleen Squibb to take lactulose--probable underlying cirrhosis/steatosis Admit ABD pain no stool X1 week no nausea no vomiting--- CT ABD = colocolonic intussusception at descending and sigmoid colon 2/2 large intraluminal lipoma?  6.4 X4.5-note that this was negative for any type of cancer and it was ischemic injury 4/23 had partial colectomy and end-colostomy 4/26 hypotensive systolic in 70s bolused IVF still hypotensive dropping hemoglobin WBC 13-profoundly hypoglycemic CBG 17-ICU transfer-brief  vasopressor requirement 4/29 neurology consulted 2/2 new right facial droop left lower extremity weakness and echolalia-CT angiogram unremarkable MRI brain unremarkable 4/30 long-term EEG at [facility] showed acute toxic metabolic encephalopathy and epileptologist did not feel patient needed AEDs 5/1 hemoglobin 4.9---2 units PRBC, core track placed 5/2 ultrasound showed no liver lesions thrombi or biliary dilatation and small amount of gallbladder sludge, MRCP without biliary obstruction?  Cholecystitis-General surgery felt no role for HIDA scan   More coherent and continues to improve And over the past a day to day and a half is eating close to 70% of meals decision made 5/11 versus 5/12 to DC NG tube/core track and patient eating well and tolerating diet    Hospital Course:  Toxic metabolic encephalopathy secondary to hepatic encephalopathy Primarily secondary to elevated ammonia on admission--also hypoglycemia contributing Lifelong lactulose 20 3 times daily with underlying hepatic encephalopathy Mentation is improved-she will need good follow-up in the outpatient setting with gastroenterology Dr. Elnoria Howard who will be CCed on discharge   Colocolonic intussusception + sigmoid colectomy/ostomy 4/23 associated with removal of 6.5 x 4.5 lipoma which was nonmalignant Appreciative WOC nurse input and ostomy management Tube feeds discontinued 5/11 as eating >50% meals Tolerating full meals without issue and managing to keep them down without issue as well as able to keep up nutrition Stabilized for discharge Patient's niece is coming to learn how to use the ostomy and patient is reasonably comfortable with self-management   Shock liver hyperbilirubinemia CT and ultrasound?  Sludge in gallbladder but general surgery feels low suspicion for cholecystitis no role for HIDA scan LFT's have trended up over the past several days however she has no abdominal pain no fever and although her bilirubin remains  within the 8-9 range, she is  stable for close outpatient follow-up with Dr. Tanda Rockers think that this is a combination of her underlying liver insufficiency from shock liver post surgery but also her underlying hepatic steatosis/cirrhosis Probably will need to be transferred/evaluated at a liver center eventually depending on labs   Unfounded anemia?  GI bleed  Large BM with dark appearing stool 4/28 Hemoglobin was spuriously low but seems to have stabilized in the 8 range although was transfused while in the ICU on 5/1 Periodic labs at this juncture   Hypokalemia Now resolved and stabilized Periodic labs at this juncture   Impaired glucose tolerance and A1c 5.5 Stop CBG checks-outpatient follow-up and discussion   Hepatic hemangioma 4/28 with known hepatic steatosis Outpatient follow-up and management   AAA incidental MRI finding needs ultrasound in 3 years   Discharge Exam: Vitals:   08/28/22 0402 08/28/22 0742  BP: 95/60 (!) 93/56  Pulse:  71  Resp: 16 18  Temp: 97.9 F (36.6 C) 98.8 F (37.1 C)  SpO2:  100%    Subj on day of d/c   Awake pleasant ate 100% of meals no chest pain no fever no nausea no vomiting Frail cachectic still icteric Chest is clear no wheeze rales rhonchi Abdomen is soft with ostomy in place midline scar Coherent x4    Discharge Instructions   Discharge Instructions     Amb Referral to Ostomy Clinic   Complete by: As directed    Reason for referral modifiers: Pre and post-operative counseling for ostomy management   Diet - low sodium heart healthy   Complete by: As directed    Increase activity slowly   Complete by: As directed       Allergies as of 08/28/2022       Reactions   Codeine Palpitations        Medication List     STOP taking these medications    atorvastatin 20 MG tablet Commonly known as: LIPITOR   gabapentin 300 MG capsule Commonly known as: NEURONTIN   hydrOXYzine 25 MG tablet Commonly known as: ATARAX    PRESCRIPTION MEDICATION       TAKE these medications    cycloSPORINE 0.05 % ophthalmic emulsion Commonly known as: RESTASIS Place 1 drop into both eyes as needed (for dry eyes).   lactulose 10 GM/15ML solution Commonly known as: CHRONULAC Place 30 mLs (20 g total) into feeding tube 3 (three) times daily. What changed:  how much to take how to take this when to take this reasons to take this   lidocaine 5 % ointment Commonly known as: XYLOCAINE Apply topically 3 (three) times daily as needed for mild pain or moderate pain.   multivitamin with minerals Tabs tablet Take 2 tablets by mouth daily.   omeprazole 40 MG capsule Commonly known as: PRILOSEC Take 40 mg by mouth daily.   REFRESH DRY EYE THERAPY OP Apply 1 drop to eye daily as needed.   traMADol 50 MG tablet Commonly known as: ULTRAM Take 0.5 tablets (25 mg total) by mouth every 12 (twelve) hours as needed for severe pain.               Durable Medical Equipment  (From admission, onward)           Start     Ordered   08/08/22 1216  For home use only DME Walker rolling  Once       Question Answer Comment  Walker: With 5 Inch Wheels   Patient needs a walker  to treat with the following condition Difficulty in walking, not elsewhere classified      08/08/22 1215           Allergies  Allergen Reactions   Codeine Palpitations    Contact information for follow-up providers     [facility] Follow up.   Specialty: Home Health Services Why: Someone from [facility] will contact you to arrange start date and time for your therapy. Contact information: [address] [place] [address] [phone]         Kinsinger, De Blanch, MD. Go on 09/06/2022.   Specialty: General Surgery Why: 5/23 at 10:10am Please arrive 30 minutes early to complete check in, and bring photo ID and insurance card. Contact information: [address] [place]  [address] [phone]              Contact information for after-discharge care     Destination     [facility] Preferred SNF .   Service: Skilled Nursing Contact information: [address] [place] [address] [phone]                      The results of significant diagnostics from this hospitalization (including imaging, microbiology, ancillary and laboratory) are listed below for reference.    Significant Diagnostic Studies: MR ABDOMEN MRCP W WO CONTAST  Result Date: 08/17/2022 CLINICAL DATA:  Jaundice EXAM: MRI ABDOMEN WITHOUT AND WITH CONTRAST (INCLUDING MRCP) TECHNIQUE: Multiplanar multisequence MR imaging of the abdomen was performed both before and after the administration of intravenous contrast. Heavily T2-weighted images of the biliary and pancreatic ducts were obtained, and three-dimensional MRCP images were rendered by post processing. CONTRAST:  4mL GADAVIST GADOBUTROL 1 MMOL/ML IV SOLN COMPARISON:  CT abdomen and pelvis dated Aug 15, 2022 FINDINGS: Lower chest: Small bilateral pleural effusions. Hepatobiliary: T2 hyperintense lesion of the left hepatic lobe measuring 3.0 x 2.4 cm on series 4, image 26 with peripheral discontinuous enhancement, consistent with benign hemangioma. No suspicious focal liver lesion. Portal vein is patent. Gallbladder wall thickening and sludge. No biliary ductal dilation. Pancreas: No mass, inflammatory changes, or other parenchymal abnormality identified. Spleen:  Within normal limits in size and appearance. Adrenals/Urinary Tract: Bilateral adrenal glands are unremarkable. No hydronephrosis. No suspicious renal lesions. Stomach/Bowel: Left lower quadrant colostomy. Otherwise unremarkable. Vascular/Lymphatic: Suprarenal abdominal aortic aneurysm mural thrombus measuring up to 3.4 cm, unchanged when compared with the prior. No enlarged lymph nodes seen in the abdomen. Other: Small volume abdominal ascites.  Diffuse soft tissue anasarca. Musculoskeletal: No suspicious bone lesions identified. IMPRESSION: Somewhat limited exam due to motion artifact. 1. Gallbladder wall thickening and sludge. Wall thickening is possibly reactive, although acute cholecystitis could also have this appearance. If there is clinical concern, HIDA scan could be performed further evaluation. 2. No biliary ductal dilation. No evidence of choledocholithiasis, although motion artifact limits evaluation. 3. Small bilateral pleural effusions, small volume abdominal ascites, and diffuse soft tissue anasarca. 4. Suprarenal abdominal aortic aneurysm measuring up to 3.4 cm, unchanged when compared with the prior. Recommend follow-up ultrasound every 3 years. This recommendation follows ACR consensus guidelines: White Paper of the ACR Incidental Findings Committee II on Vascular Findings. J Am Coll Radiol 2013; 10:789-794. Electronically Signed   By: Allegra Lai M.D.   On: 08/17/2022 20:41   US LIVER DOPPLER  Result Date: 08/16/2022 CLINICAL DATA:  Hyperbilirubinemia EXAM: DUPLEX ULTRASOUND OF LIVER TECHNIQUE:  Color and duplex Doppler ultrasound was performed to evaluate the hepatic in-flow and out-flow vessels. COMPARISON:  CT abdomen pelvis 08/15/2022 FINDINGS: Liver: Normal parenchymal echogenicity. Normal hepatic contour without nodularity. No focal lesion, mass or intrahepatic biliary ductal dilatation. Main Portal Vein size: 0.9 cm Portal Vein Velocities Main Prox:  43 cm/sec Main Mid: 41 cm/sec Main Dist:  38 cm/sec Right: 31 cm/sec Left: 15 cm/sec Hepatic Vein Velocities Right:  17 cm/sec Middle:  27 cm/sec Left:  12 cm/sec IVC: Present and patent with normal respiratory phasicity. Hepatic Artery Velocity:  96 cm/sec Splenic Vein Velocity:  42 cm/sec Spleen: 7.7 cm x 7.2 cm x 3.5 cm with a total volume of 100 cm^3 (411 cm^3 is upper limit normal) Portal Vein Occlusion/Thrombus: No Splenic Vein Occlusion/Thrombus: No Ascites: Trace  perihepatic ascites. Varices: None Small amount of gallbladder sludge. Gallbladder otherwise normal in appearance. IMPRESSION: 1. Patent portal vein with appropriate direction of flow. 2. Trace perihepatic ascites. Electronically Signed   By: Acquanetta Belling M.D.   On: 08/16/2022 16:46   CT ABDOMEN PELVIS W CONTRAST  Result Date: 08/15/2022 CLINICAL DATA:  Sepsis. Status post Hartmann's pouch. Leukocytosis and lactic acidosis EXAM: CT ABDOMEN AND PELVIS WITH CONTRAST TECHNIQUE: Multidetector CT imaging of the abdomen and pelvis was performed using the standard protocol following bolus administration of intravenous contrast. RADIATION DOSE REDUCTION: This exam was performed according to the departmental dose-optimization program which includes automated exposure control, adjustment of the mA and/or kV according to patient size and/or use of iterative reconstruction technique. CONTRAST:  75mL OMNIPAQUE IOHEXOL 350 MG/ML SOLN COMPARISON:  CT 08/10/2022.  Ultrasound 08/12/2022. FINDINGS: Lower chest: Small bilateral pleural effusions are new. Adjacent parenchymal opacities. Atelectasis is favored over infiltrate but recommend follow-up. Breathing motion. Enteric tube in the stomach. Hepatobiliary: Known left hepatic lobe segment 2 hepatic hemangioma. Periportal edema. Patent portal vein. No other space-occupying liver lesion. Gallbladder is mildly distended. Pancreas: Mild atrophy of the pancreas.  No obvious pancreatic mass. Spleen: Lobular contours of the spleen with a central splenule. Adrenals/Urinary Tract: Adrenal glands are preserved. The kidneys are without enhancing mass or collecting system dilatation. Foley catheter in the urinary bladder with some dependent air. Stomach/Bowel: There is diffuse wall thickening of the stomach. Please correlate for gastritis. Again enteric tube with tip along the distal stomach. Small bowel is nondilated. Surgical changes seen. There is a left-sided colostomy. The the  transverse and ascending colon has wall thickening but are nondilated. Edema is greatest towards the ostomy site. Rectosigmoid stump is nondilated. Additional mild wall thickening in the area of the rectum as well. Small bowel is nondilated diffusely. No areas of pneumatosis. Vascular/Lymphatic: Diffuse vascular calcifications. Normal caliber aorta and IVC. Significant atherosclerotic plaque along the aorta at the level of the diaphragm eccentric towards the right. Once again there is some ectasia of the extreme distal descending thoracic aorta of up to 3.4 cm as on prior. Reproductive: Status post hysterectomy. No adnexal masses. Other: Scattered ascites identified. There is some high density material along components of the ascites in the pelvis on the right side as on prior. The amount of ascites is slightly increased from previous. Mesenteric stranding. Increasing anasarca. Midline skin staples along the pelvis. Musculoskeletal: Scattered degenerative changes of the spine and pelvis. IMPRESSION: Again surgical changes seen from diverting colostomy along the left side. Residual rectosigmoid stump. Multifocal areas of wall thickening along the colon as well as of the stomach. Please correlate for colitis and gastritis. No obstruction or free  air. Increasing mild ascites.  There is increasing anasarca. Bilateral pleural effusions and adjacent opacities. Atelectasis is favored over infiltrate. Recommend follow-up. Enteric tube.  Foley catheter. Electronically Signed   By: Karen Kays M.D.   On: 08/15/2022 12:12   Overnight EEG with video  Result Date: 08/15/2022 Charlsie Quest, MD     08/15/2022 10:53 AM Patient Name: Sarah Moore MRN: [id number] Epilepsy Attending: Charlsie Quest Referring Physician/Provider: Milon Dikes, MD Duration: 08/14/2022 1854 to 08/15/2022 7829  Patient history: 72yo F with ams getting eeg to evaluate for seizure  Level of alertness:  awake, asleep  AEDs during EEG study: None   Technical aspects: This EEG study was done with scalp electrodes positioned according to the 10-20 International system of electrode placement. Electrical activity was reviewed with band pass filter of 1-70Hz , sensitivity of 7 uV/mm, display speed of 77mm/sec with a 60Hz  notched filter applied as appropriate. EEG data were recorded continuously and digitally stored.  Video monitoring was available and reviewed as appropriate.  Description: During awake state, no clear posterior dominant rhythm was seen. Sleep was characterized by sleep spindles (12 -14 Hz), maximal frontocentral region. EEG showed continuous generalized 3-5Hz  theta- delta slowing.  Intermittent generalized periodic discharges with triphasic morphology were also noted at 1-2 Hz, predominantly when awake/stimulated.  Hyperventilation and photic stimulation were not performed.    ABNORMALITY - Periodic discharges with triphasic morphology, generalized ( GPDs) - Continuous slow, generalized  IMPRESSION: This study showed generalized periodic discharges with triphasic morphology most likely toxic-metabolic causes. Additionally there is moderate diffuse encephalopathy.  No seizures were noted. EEG appears to be improving compared to previous day.  Charlsie Quest   EEG adult  Result Date: 08/14/2022 Charlsie Quest, MD     08/14/2022 11:40 AM Patient Name: Sarah Moore MRN: [id number] Epilepsy Attending: Charlsie Quest Referring Physician/Provider: Caryl Pina, MD Date: 08/14/2022 Duration: 26.08 mins Patient history: 72yo F with ams getting eeg to evaluate for seizure Level of alertness:  lethargic AEDs during EEG study: None Technical aspects: This EEG study was done with scalp electrodes positioned according to the 10-20 International system of electrode placement. Electrical activity was reviewed with band pass filter of 1-70Hz , sensitivity of 7 uV/mm, display speed of 36mm/sec with a 60Hz  notched filter applied as appropriate. EEG data were  recorded continuously and digitally stored.  Video monitoring was available and reviewed as appropriate. Description: EEG showed continuous generalized 2 to 3 Hz delta slowing.  Generalized periodic discharges with triphasic morphology were also noted at 2 to 2.5 Hz, rarely reaching 3 Hz.  Hyperventilation and photic stimulation were not performed.   ABNORMALITY - Periodic discharges with triphasic morphology, generalized ( GPDs) - Continuous slow, generalized IMPRESSION: This study showed generalized periodic discharges with triphasic morphology which is on the ictal-interictal continuum.  The morphology and frequency is typically associated with toxic Metabolic causes.  However the frequency reaches 2.5 to 3 Hz which could be interictal.  Consider Ativan challenge and long-term EEG monitoring. Additionally the study is suggestive of moderate to severe diffuse encephalopathy.  Dr. [place] and Dr Wilford Corner were notified. Charlsie Quest   MR BRAIN WO CONTRAST  Result Date: 08/13/2022 CLINICAL DATA:  Neuro deficit, acute, stroke suspected. EXAM: MRI HEAD WITHOUT CONTRAST TECHNIQUE: Multiplanar, multiecho pulse sequences of the brain and surrounding structures were obtained without intravenous contrast. COMPARISON:  Stroke protocol CT/CTA/CTP 08/13/2022. FINDINGS: Brain: Motion degraded study. Within limits of motion artifact, no acute infarct or  hemorrhage. No hydrocephalus or extra-axial collection. No mass or midline shift. Vascular: Normal flow voids. Skull and upper cervical spine: Normal marrow signal. Sinuses/Orbits: Unremarkable. Other: None. IMPRESSION: Motion degraded study. Within limits of motion artifact, no acute intracranial process. Electronically Signed   By: Orvan Falconer M.D.   On: 08/13/2022 20:53   CT ANGIO HEAD W OR WO CONTRAST  Result Date: 08/13/2022 CLINICAL DATA:  Neuro deficit, acute, stroke suspected. Right facial droop and aphasia. Unable to move lower extremity. EXAM: CT  ANGIOGRAPHY HEAD AND NECK CT PERFUSION BRAIN TECHNIQUE: Multidetector CT imaging of the head and neck was performed using the standard protocol during bolus administration of intravenous contrast. Multiplanar CT image reconstructions and MIPs were obtained to evaluate the vascular anatomy. Carotid stenosis measurements (when applicable) are obtained utilizing NASCET criteria, using the distal internal carotid diameter as the denominator. Multiphase CT imaging of the brain was performed following IV bolus contrast injection. Subsequent parametric perfusion maps were calculated using RAPID software. RADIATION DOSE REDUCTION: This exam was performed according to the departmental dose-optimization program which includes automated exposure control, adjustment of the mA and/or kV according to patient size and/or use of iterative reconstruction technique. CONTRAST:  OMNIPAQUE IOHEXOL 350 MG/ML SOLN COMPARISON:  Same-day head CT. FINDINGS: CTA NECK FINDINGS Aortic arch: Two-vessel arch configuration with common origin of the right brachiocephalic and left common carotid arteries. Atherosclerotic calcifications of the aortic arch and arch vessel origins. Arch vessel origins are patent. Right carotid system: No evidence of dissection, stenosis (50% or greater) or occlusion. Mild calcified plaque along the right carotid bulb and proximal right cervical ICA. Left carotid system: No evidence of dissection, stenosis (50% or greater) or occlusion. Mild calcified plaque along the left carotid bulb and proximal left cervical ICA. Vertebral arteries: Left dominant.  Patent to the skull base. Skeleton: No suspicious bone lesions. Other neck: Unremarkable. Upper chest: Small bilateral pleural effusions. Review of the MIP images confirms the above findings CTA HEAD FINDINGS Anterior circulation: Calcified plaque along the carotid siphons without hemodynamically significant stenosis. The proximal ACAs and MCAs are patent without  stenosis or aneurysm. Distal branches are symmetric. Posterior circulation: The right vertebral artery functionally terminates in PICA. The intracranial portion of the left vertebral artery and basilar artery are patent without stenosis or aneurysm. The SCAs, AICAs and PICAs are patent proximally. The PCAs are patent proximally without stenosis or aneurysm. Distal branches are symmetric. Venous sinuses: As permitted by contrast timing, patent. Anatomic variants: None. Review of the MIP images confirms the above findings CT Brain Perfusion Findings: ASPECTS: 10 CBF (<30%) Volume: 0mL Perfusion (Tmax>6.0s) volume: 0mL Mismatch Volume: 0mL Infarction Location: Not applicable. IMPRESSION: 1. No large vessel occlusion or hemodynamically significant stenosis in the head or neck vessels. 2. No evidence of core infarct or penumbra on CT perfusion. 3. Small bilateral pleural effusions. 4. Aortic Atherosclerosis (ICD10-I70.0). Electronically Signed   By: Orvan Falconer M.D.   On: 08/13/2022 17:30   CT ANGIO NECK W OR WO CONTRAST  Result Date: 08/13/2022 CLINICAL DATA:  Neuro deficit, acute, stroke suspected. Right facial droop and aphasia. Unable to move lower extremity. EXAM: CT ANGIOGRAPHY HEAD AND NECK CT PERFUSION BRAIN TECHNIQUE: Multidetector CT imaging of the head and neck was performed using the standard protocol during bolus administration of intravenous contrast. Multiplanar CT image reconstructions and MIPs were obtained to evaluate the vascular anatomy. Carotid stenosis measurements (when applicable) are obtained utilizing NASCET criteria, using the distal internal carotid diameter as the  denominator. Multiphase CT imaging of the brain was performed following IV bolus contrast injection. Subsequent parametric perfusion maps were calculated using RAPID software. RADIATION DOSE REDUCTION: This exam was performed according to the departmental dose-optimization program which includes automated exposure control,  adjustment of the mA and/or kV according to patient size and/or use of iterative reconstruction technique. CONTRAST:  OMNIPAQUE IOHEXOL 350 MG/ML SOLN COMPARISON:  Same-day head CT. FINDINGS: CTA NECK FINDINGS Aortic arch: Two-vessel arch configuration with common origin of the right brachiocephalic and left common carotid arteries. Atherosclerotic calcifications of the aortic arch and arch vessel origins. Arch vessel origins are patent. Right carotid system: No evidence of dissection, stenosis (50% or greater) or occlusion. Mild calcified plaque along the right carotid bulb and proximal right cervical ICA. Left carotid system: No evidence of dissection, stenosis (50% or greater) or occlusion. Mild calcified plaque along the left carotid bulb and proximal left cervical ICA. Vertebral arteries: Left dominant.  Patent to the skull base. Skeleton: No suspicious bone lesions. Other neck: Unremarkable. Upper chest: Small bilateral pleural effusions. Review of the MIP images confirms the above findings CTA HEAD FINDINGS Anterior circulation: Calcified plaque along the carotid siphons without hemodynamically significant stenosis. The proximal ACAs and MCAs are patent without stenosis or aneurysm. Distal branches are symmetric. Posterior circulation: The right vertebral artery functionally terminates in PICA. The intracranial portion of the left vertebral artery and basilar artery are patent without stenosis or aneurysm. The SCAs, AICAs and PICAs are patent proximally. The PCAs are patent proximally without stenosis or aneurysm. Distal branches are symmetric. Venous sinuses: As permitted by contrast timing, patent. Anatomic variants: None. Review of the MIP images confirms the above findings CT Brain Perfusion Findings: ASPECTS: 10 CBF (<30%) Volume: 0mL Perfusion (Tmax>6.0s) volume: 0mL Mismatch Volume: 0mL Infarction Location: Not applicable. IMPRESSION: 1. No large vessel occlusion or hemodynamically significant  stenosis in the head or neck vessels. 2. No evidence of core infarct or penumbra on CT perfusion. 3. Small bilateral pleural effusions. 4. Aortic Atherosclerosis (ICD10-I70.0). Electronically Signed   By: Orvan Falconer M.D.   On: 08/13/2022 17:30   CT CEREBRAL PERFUSION W CONTRAST  Result Date: 08/13/2022 CLINICAL DATA:  Neuro deficit, acute, stroke suspected. Right facial droop and aphasia. Unable to move lower extremity. EXAM: CT ANGIOGRAPHY HEAD AND NECK CT PERFUSION BRAIN TECHNIQUE: Multidetector CT imaging of the head and neck was performed using the standard protocol during bolus administration of intravenous contrast. Multiplanar CT image reconstructions and MIPs were obtained to evaluate the vascular anatomy. Carotid stenosis measurements (when applicable) are obtained utilizing NASCET criteria, using the distal internal carotid diameter as the denominator. Multiphase CT imaging of the brain was performed following IV bolus contrast injection. Subsequent parametric perfusion maps were calculated using RAPID software. RADIATION DOSE REDUCTION: This exam was performed according to the departmental dose-optimization program which includes automated exposure control, adjustment of the mA and/or kV according to patient size and/or use of iterative reconstruction technique. CONTRAST:  OMNIPAQUE IOHEXOL 350 MG/ML SOLN COMPARISON:  Same-day head CT. FINDINGS: CTA NECK FINDINGS Aortic arch: Two-vessel arch configuration with common origin of the right brachiocephalic and left common carotid arteries. Atherosclerotic calcifications of the aortic arch and arch vessel origins. Arch vessel origins are patent. Right carotid system: No evidence of dissection, stenosis (50% or greater) or occlusion. Mild calcified plaque along the right carotid bulb and proximal right cervical ICA. Left carotid system: No evidence of dissection, stenosis (50% or greater) or occlusion. Mild calcified plaque  along the left carotid  bulb and proximal left cervical ICA. Vertebral arteries: Left dominant.  Patent to the skull base. Skeleton: No suspicious bone lesions. Other neck: Unremarkable. Upper chest: Small bilateral pleural effusions. Review of the MIP images confirms the above findings CTA HEAD FINDINGS Anterior circulation: Calcified plaque along the carotid siphons without hemodynamically significant stenosis. The proximal ACAs and MCAs are patent without stenosis or aneurysm. Distal branches are symmetric. Posterior circulation: The right vertebral artery functionally terminates in PICA. The intracranial portion of the left vertebral artery and basilar artery are patent without stenosis or aneurysm. The SCAs, AICAs and PICAs are patent proximally. The PCAs are patent proximally without stenosis or aneurysm. Distal branches are symmetric. Venous sinuses: As permitted by contrast timing, patent. Anatomic variants: None. Review of the MIP images confirms the above findings CT Brain Perfusion Findings: ASPECTS: 10 CBF (<30%) Volume: 0mL Perfusion (Tmax>6.0s) volume: 0mL Mismatch Volume: 0mL Infarction Location: Not applicable. IMPRESSION: 1. No large vessel occlusion or hemodynamically significant stenosis in the head or neck vessels. 2. No evidence of core infarct or penumbra on CT perfusion. 3. Small bilateral pleural effusions. 4. Aortic Atherosclerosis (ICD10-I70.0). Electronically Signed   By: Orvan Falconer M.D.   On: 08/13/2022 17:30   CT HEAD CODE STROKE WO CONTRAST  Result Date: 08/13/2022 CLINICAL DATA:  Code stroke. Neuro deficit, acute, stroke suspected. EXAM: CT HEAD WITHOUT CONTRAST TECHNIQUE: Contiguous axial images were obtained from the base of the skull through the vertex without intravenous contrast. RADIATION DOSE REDUCTION: This exam was performed according to the departmental dose-optimization program which includes automated exposure control, adjustment of the mA and/or kV according to patient size and/or use of  iterative reconstruction technique. COMPARISON:  None Available. FINDINGS: Brain: No acute intracranial hemorrhage. Gray-white differentiation is preserved. No hydrocephalus or extra-axial collection. No mass effect or midline shift. Vascular: No hyperdense vessel or unexpected calcification. Skull: No calvarial fracture or suspicious bone lesion. Skull base is unremarkable. Sinuses/Orbits: Unremarkable. Other: None. ASPECTS ([place] Stroke Program Early CT Score) - Ganglionic level infarction (caudate, lentiform nuclei, internal capsule, insula, M1-M3 cortex): 7 - Supraganglionic infarction (M4-M6 cortex): 3 Total score (0-10 with 10 being normal): 10 IMPRESSION: No acute intracranial hemorrhage or evidence of evolving large vessel territory infarct. ASPECT score is 10. Code stroke imaging results were communicated on 08/13/2022 at 4:52 pm to provider Dr. Otelia Limes via secure text paging. Electronically Signed   By: Orvan Falconer M.D.   On: 08/13/2022 16:54   DG CHEST PORT 1 VIEW  Result Date: 08/12/2022 CLINICAL DATA:  Shortness of breath EXAM: PORTABLE CHEST 1 VIEW COMPARISON:  August 10, 2022 FINDINGS: Stable right central line. No pneumothorax. The cardiomediastinal silhouette is stable. Lungs are clear. No other abnormalities. IMPRESSION: No active disease. Electronically Signed   By: Gerome Sam III M.D.   On: 08/12/2022 13:01   US Abdomen Limited RUQ (LIVER/GB)  Result Date: 08/12/2022 CLINICAL DATA:  Abdominal pain for a week. Recent abdominal surgery. EXAM: ULTRASOUND ABDOMEN LIMITED RIGHT UPPER QUADRANT COMPARISON:  CT scan of the abdomen and pelvis August 06, 2022 and August 10, 2022 FINDINGS: Gallbladder: The gallbladder wall is borderline in thickness measuring 3 mm. Pericholecystic fluid identified. Sludge identified in the gallbladder. No stones noted. Common bile duct: Diameter: 4 mm Liver: Coarsened echotexture. Known hemangioma measuring 2.1 cm in the left hepatic lobe. No other  abnormalities in the liver. Portal vein is patent on color Doppler imaging with normal direction of blood flow towards the liver.  Other: A right pleural effusion is noted. Free fluid is identified Morison's pouch. IMPRESSION: 1. Sludge in the gallbladder. The gallbladder wall is borderline. No stones. Pericholecystic fluid, a nonspecific finding in the postoperative setting. 2. Known hemangioma in the left hepatic lobe. Known hepatic steatosis. 3. Right pleural effusion. 4. Free fluid in Morison's pouch could be postoperative given history. Electronically Signed   By: Gerome Sam III M.D.   On: 08/12/2022 13:01   ECHOCARDIOGRAM COMPLETE  Result Date: 08/11/2022    ECHOCARDIOGRAM REPORT   Patient Name:   Sarah Moore Date of Exam: 08/11/2022 Medical Rec #:  [id number]     Height:       60.0 in Accession #:    [id number]    Weight:       112.0 lb Date of Birth:  January 26, 1951    BSA:          1.459 m Patient Age:    71 years      BP:           116/66 mmHg Patient Gender: F             HR:           87 bpm. Exam Location:  Inpatient Procedure: 2D Echo, Cardiac Doppler and Color Doppler Indications:    Shock  History:        Patient has prior history of Echocardiogram examinations, most                 recent 08/01/2021. Risk Factors:Dyslipidemia.  Sonographer:    Wallie Char Referring Phys: [id number] MURALI RAMASWAMY IMPRESSIONS  1. Left ventricular ejection fraction, by estimation, is 60 to 65%. The left ventricle has normal function. The left ventricle has no regional wall motion abnormalities. Left ventricular diastolic parameters are consistent with Grade I diastolic dysfunction (impaired relaxation).  2. Right ventricular systolic function is normal. The right ventricular size is normal. There is moderately elevated pulmonary artery systolic pressure. The estimated right ventricular systolic pressure is 50.3 mmHg.  3. The mitral valve is normal in structure. No evidence of mitral valve regurgitation. No evidence  of mitral stenosis.  4. The aortic valve is normal in structure. Aortic valve regurgitation is not visualized. No aortic stenosis is present.  5. The inferior vena cava is dilated in size with <50% respiratory variability, suggesting right atrial pressure of 15 mmHg. Comparison(s): Prior images reviewed side by side. The left ventricular function is unchanged. Increased right atrial pressure and pulmonary artery pressure is now seen, but right ventricular function remains normal. FINDINGS  Left Ventricle: Left ventricular ejection fraction, by estimation, is 60 to 65%. The left ventricle has normal function. The left ventricle has no regional wall motion abnormalities. The left ventricular internal cavity size was normal in size. There is  no left ventricular hypertrophy. Left ventricular diastolic parameters are consistent with Grade I diastolic dysfunction (impaired relaxation). Indeterminate filling pressures. Right Ventricle: The right ventricular size is normal. No increase in right ventricular wall thickness. Right ventricular systolic function is normal. There is moderately elevated pulmonary artery systolic pressure. The tricuspid regurgitant velocity is 2.97 m/s, and with an assumed right atrial pressure of 15 mmHg, the estimated right ventricular systolic pressure is 50.3 mmHg. Left Atrium: Left atrial size was normal in size. Right Atrium: Right atrial size was normal in size. Pericardium: There is no evidence of pericardial effusion. Mitral Valve: The mitral valve is normal in structure. No evidence of mitral valve regurgitation. No  evidence of mitral valve stenosis. MV peak gradient, 7.7 mmHg. The mean mitral valve gradient is 3.0 mmHg. Tricuspid Valve: The tricuspid valve is normal in structure. Tricuspid valve regurgitation is mild . No evidence of tricuspid stenosis. Aortic Valve: The aortic valve is normal in structure. Aortic valve regurgitation is not visualized. No aortic stenosis is present.  Aortic valve mean gradient measures 4.0 mmHg. Aortic valve peak gradient measures 8.2 mmHg. Aortic valve area, by VTI measures 2.06 cm. Pulmonic Valve: The pulmonic valve was normal in structure. Pulmonic valve regurgitation is mild. No evidence of pulmonic stenosis. Aorta: The aortic root is normal in size and structure. Venous: The inferior vena cava is dilated in size with less than 50% respiratory variability, suggesting right atrial pressure of 15 mmHg. IAS/Shunts: No atrial level shunt detected by color flow Doppler.  LEFT VENTRICLE PLAX 2D LVIDd:         3.30 cm     Diastology LVIDs:         2.30 cm     LV e' medial:    7.19 cm/s LV PW:         1.10 cm     LV E/e' medial:  12.1 LV IVS:        1.10 cm     LV e' lateral:   8.97 cm/s LVOT diam:     1.90 cm     LV E/e' lateral: 9.7 LV SV:         51 LV SV Index:   35 LVOT Area:     2.84 cm  LV Volumes (MOD) LV vol d, MOD A2C: 57.6 ml LV vol d, MOD A4C: 68.6 ml LV vol s, MOD A2C: 26.1 ml LV vol s, MOD A4C: 27.8 ml LV SV MOD A2C:     31.5 ml LV SV MOD A4C:     68.6 ml LV SV MOD BP:      36.3 ml RIGHT VENTRICLE             IVC RV Basal diam:  3.00 cm     IVC diam: 2.50 cm RV S prime:     14.50 cm/s TAPSE (M-mode): 2.3 cm LEFT ATRIUM             Index        RIGHT ATRIUM           Index LA diam:        3.10 cm 2.12 cm/m   RA Area:     10.70 cm LA Vol (A2C):   23.2 ml 15.90 ml/m  RA Volume:   21.00 ml  14.39 ml/m LA Vol (A4C):   20.9 ml 14.33 ml/m LA Biplane Vol: 22.1 ml 15.15 ml/m  AORTIC VALVE AV Area (Vmax):    2.03 cm AV Area (Vmean):   1.97 cm AV Area (VTI):     2.06 cm AV Vmax:           143.50 cm/s AV Vmean:          97.150 cm/s AV VTI:            0.249 m AV Peak Grad:      8.2 mmHg AV Mean Grad:      4.0 mmHg LVOT Vmax:         102.50 cm/s LVOT Vmean:        67.550 cm/s LVOT VTI:          0.180 m LVOT/AV VTI ratio: 0.72  AORTA Ao  Root diam: 2.90 cm Ao Asc diam:  3.10 cm MITRAL VALVE                TRICUSPID VALVE MV Area (PHT): 3.28 cm     TR Peak  grad:   35.3 mmHg MV Area VTI:   1.89 cm     TR Vmax:        297.00 cm/s MV Peak grad:  7.7 mmHg MV Mean grad:  3.0 mmHg     SHUNTS MV Vmax:       1.39 m/s     Systemic VTI:  0.18 m MV Vmean:      77.6 cm/s    Systemic Diam: 1.90 cm MV Decel Time: 231 msec MV E velocity: 87.20 cm/s MV A velocity: 120.00 cm/s MV E/A ratio:  0.73 Mihai Croitoru MD Electronically signed by Thurmon Fair MD Signature Date/Time: 08/11/2022/11:18:35 AM    Final    CT ABDOMEN PELVIS W CONTRAST  Result Date: 08/10/2022 CLINICAL DATA:  72 year old with postop abdominal pain. History of colectomy and colostomy. EXAM: CT ABDOMEN AND PELVIS WITH CONTRAST TECHNIQUE: Multidetector CT imaging of the abdomen and pelvis was performed using the standard protocol following bolus administration of intravenous contrast. RADIATION DOSE REDUCTION: This exam was performed according to the departmental dose-optimization program which includes automated exposure control, adjustment of the mA and/or kV according to patient size and/or use of iterative reconstruction technique. CONTRAST:  80mL OMNIPAQUE IOHEXOL 300 MG/ML  SOLN COMPARISON:  CT abdomen and pelvis 08/06/2022 FINDINGS: Lower chest: 3 mm nodular density along the right major fissure on the image 16/6 was probably present in 2014 and suggestive for a benign finding. Otherwise, the lung bases are clear. Hepatobiliary: High-density material in the gallbladder without distension or inflammatory changes. Main portal venous system is patent. No biliary dilatation. Stable appearance of the liver. Patient has a hemangioma involving the left hepatic lobe which is poorly characterized on this exam and markedly decreased in size compared to the exam in 2014. Pancreas: Unremarkable. No pancreatic ductal dilatation or surrounding inflammatory changes. Spleen: Normal in size without focal abnormality. Adrenals/Urinary Tract: Normal adrenal glands. Normal appearance of both kidneys without hydronephrosis.  Normal appearance of the urinary bladder. Stomach/Bowel: Partial colectomy with resection of at least a portion of the sigmoid colon. Patient has a Hartmann's pouch. There is a colostomy involving the descending colon. Mild distention of the stomach with air-fluid level. No small bowel distention or obstruction. Mild wall thickening along the proximal aspect of the Hartmann's pouch. There appears to be a normal appendix. Vascular/Lymphatic: Aorta at the hiatus measures up to 3.4 cm with eccentric plaque occupying approximately 50% of the lumen. Diffuse atherosclerotic calcifications involving the abdominal aorta. No significant lymph node enlargement in the abdomen or pelvis. Reproductive: Prostate is unremarkable. Other: Small amount of slightly dense fluid along the right side of the pelvis on image 63/2. Suspect this represents postoperative fluid with blood products. Small air-fluid collections near the Hartmann's pouch on image 62/2 are likely associated with small bowel loops. Multiple pockets of gas along the lateral left abdomen and upper pelvic region are compatible with recent postoperative changes. Small amount of free air in the upper abdomen. No suspicious postoperative fluid collections. Mild mesenteric edema. Surgical skin staples along the anterior abdominal wall. Musculoskeletal: No acute bone abnormality. IMPRESSION: 1. Expected postoperative changes following a partial colectomy and colostomy creation. Multiple small pockets of air within the abdomen are compatible with recent surgery. Small amount of slightly  dense fluid in the pelvis could represent blood products. No suspicious postoperative fluid collections at this time. 2. 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 Recommend follow-up ultrasound every 3 years. This recommendation follows ACR consensus guidelines: White Paper of the ACR Incidental Findings Committee II on Vascular Findings. J Am Coll Radiol 2013;  16:109-604. 3.  Aortic Atherosclerosis (ICD10-I70.0). Electronically Signed   By: Richarda Overlie M.D.   On: 08/10/2022 17:23   DG CHEST PORT 1 VIEW  Result Date: 08/10/2022 CLINICAL DATA:  Central line placement. EXAM: PORTABLE CHEST 1 VIEW COMPARISON:  One-view chest x-ray 08/10/2022 at 1:55 p.m. FINDINGS: Heart size is normal. A right IJ line has been placed. The tip is at the. Rule junction. No pneumothorax is present. Lung volumes are low. No edema or effusion is present. No focal airspace disease is present. The visualized soft tissues and bony thorax are unremarkable. IMPRESSION: Interval placement of right IJ line. The tip is at the cavoatrial junction. No pneumothorax. Electronically Signed   By: Marin Roberts M.D.   On: 08/10/2022 16:22   DG CHEST PORT 1 VIEW  Result Date: 08/10/2022 CLINICAL DATA:  Respiratory failure EXAM: PORTABLE CHEST 1 VIEW COMPARISON:  05/13/2021 FINDINGS: Cardiac size is within normal limits. Thoracic aorta is ectatic. There are no signs of pulmonary edema or focal pulmonary consolidation. There is no pleural effusion or pneumothorax. IMPRESSION: No active disease. Electronically Signed   By: Ernie Avena M.D.   On: 08/10/2022 14:10   CT ABDOMEN PELVIS W CONTRAST  Result Date: 08/06/2022 CLINICAL DATA:  Hematuria, suspected bowel obstruction EXAM: CT ABDOMEN AND PELVIS WITH CONTRAST TECHNIQUE: Multidetector CT imaging of the abdomen and pelvis was performed using the standard protocol following bolus administration of intravenous contrast. RADIATION DOSE REDUCTION: This exam was performed according to the departmental dose-optimization program which includes automated exposure control, adjustment of the mA and/or kV according to patient size and/or use of iterative reconstruction technique. CONTRAST:  80mL OMNIPAQUE IOHEXOL 300 MG/ML  SOLN COMPARISON:  08/02/2022, 07/02/2012 FINDINGS: Lower chest: No acute pleural or parenchymal lung disease. Hepatobiliary:  Involution of the cavernous hemangioma seen within the left lobe liver on prior exam. This now measures up to 2.3 cm in maximal dimension, previously measuring 5.6 cm. No acute liver abnormality. The gallbladder is unremarkable. No biliary duct dilation. Pancreas: Moderate pancreatic atrophy. No acute inflammatory changes or pancreatic duct dilation. Spleen: Normal in size without focal abnormality. Adrenals/Urinary Tract: The kidneys enhance normally and symmetrically. No urinary tract calculi or obstructive uropathy. Bladder is decompressed, the bladder is minimally distended, without focal abnormality. The adrenals are unremarkable. Stomach/Bowel: There is a colo-colic intussusception beginning at the junction of the descending and sigmoid colon. Lead point is likely a large intraluminal lipoma, which has increased in size since prior study measuring 6.4 x 4.5 cm on today's exam. There is mild mural thickening of the intussusceptum, without frank evidence of bowel ischemia. Moderate gaseous distension of the colon proximal to the intussusception could reflect partial functional colonic obstruction. Gas is seen within the rectal vault. There is no evidence of small-bowel obstruction. Normal appendix right lower quadrant. Vascular/Lymphatic: There is marked eccentric atheromatous plaque within the aorta at the diaphragmatic hiatus, with approximately 50% stenosis in that region. Atherosclerosis throughout the remainder of the visualized thoracoabdominal aorta without evidence of aneurysm or dissection. No pathologic adenopathy. Reproductive: Status post hysterectomy. No adnexal masses. Other: No free fluid or free intraperitoneal gas. No abdominal wall hernia. Musculoskeletal: There  are no acute or destructive bony lesions. Reconstructed images demonstrate no additional findings. IMPRESSION: 1. Colo-colic intussusception at the junction of the descending and sigmoid colon, likely due to a large intraluminal lipoma  serving as the lead point. There is mild mural thickening of the intussusceptum, without frank evidence of bowel ischemia. Likely partial functional colonic obstruction with moderate gaseous distension of the proximal colon. Gas is seen within the rectal vault. Surgical consultation recommended. 2. Aortic Atherosclerosis (ICD10-I70.0). Moderate eccentric plaque located at the diaphragmatic hiatus, with approximately 50% narrowing of the aortic lumen in that region. 3. Significant involution of the cavernous hemangioma within the left lobe liver as above. Critical Value/emergent results were called by telephone at the time of interpretation on 08/06/2022 at 5:06 pm to provider [facility] G[place] , who verbally acknowledged these results. Electronically Signed   By: Sharlet Salina M.D.   On: 08/06/2022 17:08   DG Abd 2 Views  Result Date: 08/02/2022 CLINICAL DATA:  Abdominal pain. EXAM: ABDOMEN - 2 VIEW COMPARISON:  None Available. FINDINGS: The bowel gas pattern is normal without gaseous distention. No free intraperitoneal air. No radio-opaque calculi or other significant radiographic abnormality are seen. Increased stool noted consistent with constipation. IMPRESSION: Constipation.  No evidence of obstruction or free air. Electronically Signed   By: Layla Maw M.D.   On: 08/02/2022 11:21    Microbiology: No results found for this or any previous visit (from the past 240 hour(s)).   Labs: Basic Metabolic Panel: Recent Labs  Lab 08/23/22 0118 08/24/22 0308 08/27/22 0016  NA 138 135 137  K 4.5 4.2 3.5  CL 109 107 107  CO2 23 24 22   GLUCOSE 127* 128* 90  BUN 13 10 14   CREATININE 0.50 0.47 0.50  CALCIUM 9.1 9.1 9.5  MG 1.9  --   --   PHOS 3.9  --   --     Liver Function Tests: Recent Labs  Lab 08/23/22 0118 08/24/22 0308 08/27/22 0016  AST 136* 136* 216*  ALT 191* 168* 190*  ALKPHOS 228* 231* 271*  BILITOT 9.2* 8.3* 8.3*  PROT 6.0* 6.0* 7.0  ALBUMIN 1.9* 1.9* 2.1*    No results  for input(s): "LIPASE", "AMYLASE" in the last 168 hours. No results for input(s): "AMMONIA" in the last 168 hours. CBC: Recent Labs  Lab 08/22/22 0008 08/23/22 0118 08/24/22 0308 08/27/22 0016  WBC 9.8 7.6 6.9 4.9  NEUTROABS  --   --   --  1.8  HGB 8.9* 9.1* 8.7* 8.8*  HCT 27.5* 28.1* 25.5* 27.1*  MCV 94.8 95.9 92.7 96.8  PLT 146* 153 172 220    Cardiac Enzymes: No results for input(s): "CKTOTAL", "CKMB", "CKMBINDEX", "TROPONINI" in the last 168 hours. BNP: BNP (last 3 results) No results for input(s): "BNP" in the last 8760 hours.  ProBNP (last 3 results) No results for input(s): "PROBNP" in the last 8760 hours.  CBG: Recent Labs  Lab 08/27/22 1719 08/27/22 1943 08/28/22 0054 08/28/22 0404 08/28/22 0742  GLUCAP 113* 106* 102* 116* 106*        Signed:  Rhetta Mura MD   [facility] 08/28/2022, 10:09 AM

## 2022-08-28 NOTE — Progress Notes (Signed)
Seen and examined and no new changes patient seems stable for discharge to rehab when able DNR signed  I have reviewed my discharge summary from yesterday and updated Pleas Koch, MD [facility] 10:12 AM

## 2022-08-28 NOTE — TOC Transition Note (Signed)
Transition of Care [facility]) - CM/SW Discharge Note   Patient Details  Name: Sarah Moore MRN: [id number] Date of Birth: 04-30-1950  Transition of Care [facility]) CM/SW Contact:  Carley Hammed, LCSW Phone Number: 08/28/2022, 12:06 PM   Clinical Narrative:    Pt to be transported to [facility] via [place]. Nurse to call report to [phone].   Final next level of care: Skilled Nursing Facility Barriers to Discharge: Barriers Resolved   Patient Goals and CMS Choice CMS Medicare.gov Compare Post Acute Care list provided to:: Patient Choice offered to / list presented to : Patient, Adult Children  Discharge Placement                Patient chooses bed at: [facility] Patient to be transferred to facility by: [facility] Name of family member notified: [name] Patient and family notified of of transfer: 08/28/22  Discharge Plan and Services Additional resources added to the After Visit Summary for     Discharge Planning Services: CM Consult Post Acute Care Choice: Skilled Nursing Facility          DME Arranged: Dan Humphreys youth DME Agency: [employer] Date DME Agency Contacted: 08/08/22 Time DME Agency Contacted: [phone] Representative spoke with at DME Agency: Shaune Leeks HH Arranged: RN, PT [facility] Agency: [facility] Date [facility] Agency Contacted: 08/08/22 Time HH Agency Contacted: [id number] Representative spoke with at [facility] Agency: Ephriam Knuckles  Social Determinants of Health (SDOH) Interventions SDOH Screenings   Food Insecurity: No Food Insecurity (08/06/2022)  Housing: Low Risk  (08/06/2022)  Transportation Needs: No Transportation Needs (08/06/2022)  Utilities: Not At Risk (08/06/2022)  Tobacco Use: Medium Risk (08/16/2022)     Readmission Risk Interventions    08/07/2022   10:37 AM  Readmission Risk Prevention Plan  Post Dischage Appt Complete  Medication Screening Complete  Transportation Screening Complete

## 2022-08-28 NOTE — Consult Note (Signed)
WOC Nurse ostomy follow up Contacted by bedside nursing, pouch leaking. Changed 5/13 as well. Difficult stoma, retracted with os at skin level with creasing Stoma type/location: LUQ Stomal assessment/size: 1" oval, os at skin level, pink, moist, retracted  Peristomal assessment: intact  Treatment options for stomal/peristomal skin: 2" skin barrier and an additional 1/2 of ostomy barrier from 6-12 o'clock to flatten pouching surface, she has dips in her abdominal topography additionally at 3 and 9 o'clock    Output seedy,, green, +flatus Ostomy pouching: 1pc.soft convex with 2 barrier rings, see above  Education provided: stressed importance of belt, demonstrated placement, requested patient to ask daughter to pick up SS package from her home to see if medium belt that will fit her better is in that package for use at SNF Enrolled patient in [place] [employer] Discharge program: Yes  (5) 1pc soft convex and (5) barrier rings ordered, requested unit secretary and bedside nursing to make sure patient receives supplies prior to leaving for SNF.  WOC Nurse will follow along with you for continued support with ostomy teaching and care Cherae Marton [facility], RN, [place], CNS, [place] [phone]

## 2022-08-28 NOTE — Plan of Care (Signed)
Pt alert and oriented x 4. Up 1 assist with cane to bsc. Pt colostomy bag changed due to leaking. After changing trouble with sealing due to stoma close to umbilicus. Reinforced. Will check with dayshift to see if wound ostomy needs consult prior to d/c. No prns given. Med compliant. Vitals stable.  Problem: Education: Goal: Knowledge of General Education information will improve Description: Including pain rating scale, medication(s)/side effects and non-pharmacologic comfort measures Outcome: Progressing   Problem: Health Behavior/Discharge Planning: Goal: Ability to manage health-related needs will improve Outcome: Progressing   Problem: Clinical Measurements: Goal: Ability to maintain clinical measurements within normal limits will improve Outcome: Progressing Goal: Will remain free from infection Outcome: Progressing Goal: Diagnostic test results will improve Outcome: Progressing Goal: Respiratory complications will improve Outcome: Progressing Goal: Cardiovascular complication will be avoided Outcome: Progressing   Problem: Activity: Goal: Risk for activity intolerance will decrease Outcome: Progressing   Problem: Nutrition: Goal: Adequate nutrition will be maintained Outcome: Progressing   Problem: Coping: Goal: Level of anxiety will decrease Outcome: Progressing   Problem: Elimination: Goal: Will not experience complications related to bowel motility Outcome: Progressing Goal: Will not experience complications related to urinary retention Outcome: Progressing   Problem: Pain Managment: Goal: General experience of comfort will improve Outcome: Progressing   Problem: Safety: Goal: Ability to remain free from injury will improve Outcome: Progressing   Problem: Skin Integrity: Goal: Risk for impaired skin integrity will decrease Outcome: Progressing   Problem: Education: Goal: Understanding of discharge needs will improve Outcome: Progressing Goal:  Verbalization of understanding of the causes of altered bowel function will improve Outcome: Progressing   Problem: Activity: Goal: Ability to tolerate increased activity will improve Outcome: Progressing   Problem: Bowel/Gastric: Goal: Gastrointestinal status for postoperative course will improve Outcome: Progressing   Problem: Health Behavior/Discharge Planning: Goal: Identification of community resources to assist with postoperative recovery needs will improve Outcome: Progressing   Problem: Nutritional: Goal: Will attain and maintain optimal nutritional status will improve Outcome: Progressing   Problem: Clinical Measurements: Goal: Postoperative complications will be avoided or minimized Outcome: Progressing   Problem: Respiratory: Goal: Respiratory status will improve Outcome: Progressing   Problem: Skin Integrity: Goal: Will show signs of wound healing Outcome: Progressing   Problem: Safety: Goal: Non-violent Restraint(s) Outcome: Progressing

## 2022-08-29 DIAGNOSIS — M6281 Muscle weakness (generalized): Secondary | ICD-10-CM | POA: Diagnosis not present

## 2022-08-30 DIAGNOSIS — M6281 Muscle weakness (generalized): Secondary | ICD-10-CM | POA: Diagnosis not present

## 2022-08-30 DIAGNOSIS — R2681 Unsteadiness on feet: Secondary | ICD-10-CM | POA: Diagnosis not present

## 2022-08-30 DIAGNOSIS — K5900 Constipation, unspecified: Secondary | ICD-10-CM | POA: Diagnosis not present

## 2022-08-30 DIAGNOSIS — Z48815 Encounter for surgical aftercare following surgery on the digestive system: Secondary | ICD-10-CM | POA: Diagnosis not present

## 2022-08-30 DIAGNOSIS — K219 Gastro-esophageal reflux disease without esophagitis: Secondary | ICD-10-CM | POA: Diagnosis not present

## 2022-08-30 DIAGNOSIS — R2689 Other abnormalities of gait and mobility: Secondary | ICD-10-CM | POA: Diagnosis not present

## 2022-08-30 DIAGNOSIS — Z433 Encounter for attention to colostomy: Secondary | ICD-10-CM | POA: Diagnosis not present

## 2022-09-03 DIAGNOSIS — K769 Liver disease, unspecified: Secondary | ICD-10-CM | POA: Diagnosis not present

## 2022-09-03 DIAGNOSIS — Z48815 Encounter for surgical aftercare following surgery on the digestive system: Secondary | ICD-10-CM | POA: Diagnosis not present

## 2022-09-03 DIAGNOSIS — M6281 Muscle weakness (generalized): Secondary | ICD-10-CM | POA: Diagnosis not present

## 2022-09-03 DIAGNOSIS — G9341 Metabolic encephalopathy: Secondary | ICD-10-CM | POA: Diagnosis not present

## 2022-09-10 DIAGNOSIS — Z48815 Encounter for surgical aftercare following surgery on the digestive system: Secondary | ICD-10-CM | POA: Diagnosis not present

## 2022-09-10 DIAGNOSIS — K769 Liver disease, unspecified: Secondary | ICD-10-CM | POA: Diagnosis not present

## 2022-09-10 DIAGNOSIS — G9341 Metabolic encephalopathy: Secondary | ICD-10-CM | POA: Diagnosis not present

## 2022-09-10 DIAGNOSIS — M6281 Muscle weakness (generalized): Secondary | ICD-10-CM | POA: Diagnosis not present

## 2022-09-24 ENCOUNTER — Ambulatory Visit (HOSPITAL_COMMUNITY)
Admission: RE | Admit: 2022-09-24 | Discharge: 2022-09-24 | Disposition: A | Discharge status: Home / Self Care | Payer: Medicare HMO | Source: Ambulatory Visit | Attending: Nurse Practitioner | Admitting: Nurse Practitioner

## 2022-09-24 DIAGNOSIS — K9409 Other complications of colostomy: Secondary | ICD-10-CM | POA: Diagnosis not present

## 2022-09-24 DIAGNOSIS — Z9049 Acquired absence of other specified parts of digestive tract: Secondary | ICD-10-CM | POA: Diagnosis not present

## 2022-09-24 DIAGNOSIS — K9400 Colostomy complication, unspecified: Secondary | ICD-10-CM

## 2022-09-24 DIAGNOSIS — L24B3 Irritant contact dermatitis related to fecal or urinary stoma or fistula: Secondary | ICD-10-CM | POA: Diagnosis not present

## 2022-09-24 DIAGNOSIS — Z433 Encounter for attention to colostomy: Secondary | ICD-10-CM | POA: Diagnosis present

## 2022-09-24 NOTE — Progress Notes (Signed)
[facility]   Reason for visit:  LLQ colostomy  Continues to have anxiety surrounding pouch seal and leaks.  Here with daughter today but is very independent.  States she was not taught ostomy care when in rehab.  Continues to receive [facility] services.  HH nurse will need to continue pouch changes in patient's home to increase comfort level with ostomy self care.  HPI:  Sigmoid colectomy with colostomy due to intussusception Past Medical History:  Diagnosis Date   Acid reflux    Arthritis    Chest pain    Fatigue    GERD (gastroesophageal reflux disease)    Hyperlipidemia    Family History  Adopted: Yes  Problem Relation Age of Onset   Asthma Brother    Stomach cancer Brother    Clotting disorder Son        blood clot   Allergies  Allergen Reactions   Codeine Palpitations   Current Outpatient Medications  Medication Sig Dispense Refill Last Dose   cycloSPORINE (RESTASIS) 0.05 % ophthalmic emulsion Place 1 drop into both eyes as needed (for dry eyes).      Glycerin-Polysorbate 80 (REFRESH DRY EYE THERAPY OP) Apply 1 drop to eye daily as needed.      lactulose (CHRONULAC) 10 GM/15ML solution Place 30 mLs (20 g total) into feeding tube 3 (three) times daily. 236 mL 0    lidocaine (XYLOCAINE) 5 % ointment Apply topically 3 (three) times daily as needed for mild pain or moderate pain. 35.44 g 0    Multiple Vitamin (MULTIVITAMIN WITH MINERALS) TABS tablet Take 2 tablets by mouth daily.      omeprazole (PRILOSEC) 40 MG capsule Take 40 mg by mouth daily. (Patient not taking: Reported on 08/08/2022)      traMADol (ULTRAM) 50 MG tablet Take 0.5 tablets (25 mg total) by mouth every 12 (twelve) hours as needed for severe pain. 30 tablet 0    No current facility-administered medications for this encounter.   ROS  Review of Systems  Constitutional:  Positive for appetite change and fatigue.  Gastrointestinal:        LUQ end colostomy  Skin:  Positive for rash.       Peristomal  breakdown  Psychiatric/Behavioral: Negative.         Is stressed about pouch and fearful of leaks.  Feels like she lacks knowledge about ostomy self care.   All other systems reviewed and are negative.  Vital signs:  There were no vitals taken for this visit. Exam:  Physical Exam Constitutional:      Appearance: Normal appearance.  Abdominal:     Palpations: Abdomen is soft.  Skin:    General: Skin is warm and dry.     Findings: Erythema present.     Comments: Peristomal breakdown  Neurological:     Mental Status: She is alert and oriented to person, place, and time.  Psychiatric:        Mood and Affect: Mood normal.        Behavior: Behavior normal.     Stoma type/location:  LUQ end colostomy Stomal assessment/size:  7/8" flush stoma Peristomal assessment:  contact dermatitis from contact with stool, deep creasing at 3 and 9 o'clock, leads into umbilicus.  THis is where majority of leaks occur. Treatment options for stomal/peristomal skin: 2 piece 1 3/4" pouch with filtered, convex pouch.  Prefers clip closure as she can open and close better.  Output: soft brown stool.  Ostomy pouching:  2pc. With stoma powder and skin prep to skin breakdown Education provided:  pouch change performed and daughter and patient observed and verbalized understanding.  Step by step instructions printed for [facility] nurse to review and order needed supplies.     Impression/dx  Colostomy Contact dermatitis Discussion  See back as needed.  Plan   Appointment scheduled    Visit time: 50 minutes.   Maple Hudson FNP-BC

## 2022-09-24 NOTE — Discharge Instructions (Addendum)
Ostomy care:  2 piece 1 3/4" pouch with FILTER- Convex barrier  Wash with soap and water.  Apply stoma powder and skin prep Apply barrier ring (pull into strip for belly button) Remainder of barrier ring around stoma Apply 2 piece pouch Clip on bottom Surgery by Dr Sheliah Hatch   [phone]  I will plan to enroll with [facility] after [facility] discharge if you have not enrolled with a supplier.

## 2022-09-25 DIAGNOSIS — L24B3 Irritant contact dermatitis related to fecal or urinary stoma or fistula: Secondary | ICD-10-CM | POA: Insufficient documentation

## 2022-09-25 DIAGNOSIS — K9400 Colostomy complication, unspecified: Secondary | ICD-10-CM | POA: Insufficient documentation

## 2022-10-02 ENCOUNTER — Ambulatory Visit (HOSPITAL_COMMUNITY): Payer: Medicare HMO | Admitting: Nurse Practitioner

## 2022-10-08 ENCOUNTER — Ambulatory Visit (HOSPITAL_COMMUNITY)
Admission: RE | Admit: 2022-10-08 | Discharge: 2022-10-08 | Disposition: A | Discharge status: Home / Self Care | Payer: Medicare HMO | Source: Ambulatory Visit | Attending: Nurse Practitioner | Admitting: Nurse Practitioner

## 2022-10-08 DIAGNOSIS — L24B3 Irritant contact dermatitis related to fecal or urinary stoma or fistula: Secondary | ICD-10-CM | POA: Diagnosis not present

## 2022-10-08 DIAGNOSIS — K9400 Colostomy complication, unspecified: Secondary | ICD-10-CM

## 2022-10-08 DIAGNOSIS — Z933 Colostomy status: Secondary | ICD-10-CM | POA: Diagnosis present

## 2022-10-08 NOTE — Discharge Instructions (Signed)
Continue same pouching Follow up appointment made

## 2022-10-08 NOTE — Progress Notes (Signed)
[name]   Reason for visit:  LLQ colostomy HPI:   Past Medical History:  Diagnosis Date  . Acid reflux   . Arthritis   . Chest pain   . Fatigue   . GERD (gastroesophageal reflux disease)   . Hyperlipidemia    Family History  Adopted: Yes  Problem Relation Age of Onset  . Asthma Brother   . Stomach cancer Brother   . Clotting disorder Son        blood clot   Allergies  Allergen Reactions  . Codeine Palpitations   Current Outpatient Medications  Medication Sig Dispense Refill Last Dose  . cycloSPORINE (RESTASIS) 0.05 % ophthalmic emulsion Place 1 drop into both eyes as needed (for dry eyes).     . Glycerin-Polysorbate 80 (REFRESH DRY EYE THERAPY OP) Apply 1 drop to eye daily as needed.     . lactulose (CHRONULAC) 10 GM/15ML solution Place 30 mLs (20 g total) into feeding tube 3 (three) times daily. 236 mL 0   . lidocaine (XYLOCAINE) 5 % ointment Apply topically 3 (three) times daily as needed for mild pain or moderate pain. 35.44 g 0   . Multiple Vitamin (MULTIVITAMIN WITH MINERALS) TABS tablet Take 2 tablets by mouth daily.     Marland Kitchen omeprazole (PRILOSEC) 40 MG capsule Take 40 mg by mouth daily. (Patient not taking: Reported on 08/08/2022)     . traMADol (ULTRAM) 50 MG tablet Take 0.5 tablets (25 mg total) by mouth every 12 (twelve) hours as needed for severe pain. 30 tablet 0    No current facility-administered medications for this encounter.   ROS  Review of Systems  Constitutional:  Positive for fatigue.  Gastrointestinal:        LLq colostomy  Skin:  Positive for rash.       Contact dermatitis to peristomal skin  All other systems reviewed and are negative. Vital signs:  BP 117/75 (BP Location: Right Arm)   Pulse 79   Temp (!) 97.5 F (36.4 C) (Oral)   Resp 18   SpO2 100%  Exam:  Physical Exam Vitals reviewed.  Constitutional:      Appearance: Normal appearance.  Abdominal:     Palpations: Abdomen is soft.  Skin:    General: Skin is warm  and dry.     Findings: Rash present.  Neurological:     Mental Status: She is alert and oriented to person, place, and time.  Psychiatric:        Mood and Affect: Mood normal.        Behavior: Behavior normal.    Stoma type/location:  LLQ colostomy Stomal assessment/size:  Retracted 7/8" pink stoma Peristomal assessment:  irritant contact dermatitis Treatment options for stomal/peristomal skin: Barrier ring to creasing and around stoma, convex barrier and 2 piece pouch Output: soft brown stool Ostomy pouching: 2pc.  1 3/4" pouch with convex barrier  Education provided:  pouch change performed.  Patient would benefit from presized pouches.      Impression/dx  Contact dermatitis, improving  barrier opening cut too large Colostomy  Discussion  Patient unsure if she would pursue reversal.  Does not want to undergo another major surgery at this time.  Plan  See back 2 weeks.  Will need to enroll with supplier. HH remains active at this time.     Visit time: 45 minutes.   Sarah Hudson FNP-BC

## 2022-10-22 ENCOUNTER — Ambulatory Visit (HOSPITAL_COMMUNITY)
Admission: RE | Admit: 2022-10-22 | Discharge: 2022-10-22 | Disposition: A | Discharge status: Home / Self Care | Payer: Medicare HMO | Source: Ambulatory Visit

## 2022-10-22 DIAGNOSIS — E782 Mixed hyperlipidemia: Secondary | ICD-10-CM | POA: Diagnosis not present

## 2022-10-22 DIAGNOSIS — K581 Irritable bowel syndrome with constipation: Secondary | ICD-10-CM | POA: Diagnosis not present

## 2022-10-22 DIAGNOSIS — L259 Unspecified contact dermatitis, unspecified cause: Secondary | ICD-10-CM | POA: Insufficient documentation

## 2022-10-22 DIAGNOSIS — K9400 Colostomy complication, unspecified: Secondary | ICD-10-CM | POA: Insufficient documentation

## 2022-10-22 DIAGNOSIS — K219 Gastro-esophageal reflux disease without esophagitis: Secondary | ICD-10-CM | POA: Diagnosis not present

## 2022-10-22 DIAGNOSIS — J302 Other seasonal allergic rhinitis: Secondary | ICD-10-CM | POA: Diagnosis not present

## 2022-10-22 DIAGNOSIS — M1990 Unspecified osteoarthritis, unspecified site: Secondary | ICD-10-CM | POA: Diagnosis not present

## 2022-10-22 DIAGNOSIS — Z720 Tobacco use: Secondary | ICD-10-CM | POA: Diagnosis not present

## 2022-10-22 NOTE — Progress Notes (Signed)
[facility]   Reason for visit:  LLQ colostomy HPI:   Past Medical History:  Diagnosis Date   Acid reflux    Arthritis    Chest pain    Fatigue    GERD (gastroesophageal reflux disease)    Hyperlipidemia    Family History  Adopted: Yes  Problem Relation Age of Onset   Asthma Brother    Stomach cancer Brother    Clotting disorder Son        blood clot   Allergies  Allergen Reactions   Codeine Palpitations   Current Outpatient Medications  Medication Sig Dispense Refill Last Dose   cycloSPORINE (RESTASIS) 0.05 % ophthalmic emulsion Place 1 drop into both eyes as needed (for dry eyes).      Glycerin-Polysorbate 80 (REFRESH DRY EYE THERAPY OP) Apply 1 drop to eye daily as needed.      lactulose (CHRONULAC) 10 GM/15ML solution Place 30 mLs (20 g total) into feeding tube 3 (three) times daily. 236 mL 0    lidocaine (XYLOCAINE) 5 % ointment Apply topically 3 (three) times daily as needed for mild pain or moderate pain. 35.44 g 0    Multiple Vitamin (MULTIVITAMIN WITH MINERALS) TABS tablet Take 2 tablets by mouth daily.      omeprazole (PRILOSEC) 40 MG capsule Take 40 mg by mouth daily. (Patient not taking: Reported on 08/08/2022)      traMADol (ULTRAM) 50 MG tablet Take 0.5 tablets (25 mg total) by mouth every 12 (twelve) hours as needed for severe pain. 30 tablet 0    No current facility-administered medications for this encounter.   ROS  Review of Systems  Gastrointestinal:        LLQ colostomy  Skin:  Positive for rash.       Peristomal breakdown, improving  Psychiatric/Behavioral: Negative.    All other systems reviewed and are negative.  Vital signs:  BP 109/82 (BP Location: Right Arm)   Pulse 87   Temp 98.3 F (36.8 C) (Oral)   Resp 18   SpO2 96%  Exam:  Physical Exam Vitals reviewed.  Constitutional:      Appearance: Normal appearance.  Abdominal:     Palpations: Abdomen is soft.     Comments: LLQ colostomy, recessed  Skin:    General: Skin  is warm and dry.  Neurological:     Mental Status: She is alert and oriented to person, place, and time.  Psychiatric:        Mood and Affect: Mood normal.        Behavior: Behavior normal.     Stoma type/location:  LLQ colostomy Stomal assessment/size:  retracted 7/8" pink stoma Peristomal assessment:  denuded skin along bottom half Treatment options for stomal/peristomal skin: barrier ring to creasing and around stoma Output: soft brown stool Ostomy pouching: 2pc. 1 3/4" pouch with barrier ring  Education provided:  will enroll for supplies    Impression/dx  Contact dermatitis Colostomy complication Discussion  Continue same pouching  add barrier strips to perimeter of pouch for support Plan  See back as needed    Visit time: 45 minutes.   Maple Hudson FNP-BC

## 2022-10-23 ENCOUNTER — Other Ambulatory Visit (HOSPITAL_COMMUNITY): Payer: Self-pay | Admitting: Nurse Practitioner

## 2022-10-23 DIAGNOSIS — K9400 Colostomy complication, unspecified: Secondary | ICD-10-CM

## 2022-10-23 DIAGNOSIS — L24B3 Irritant contact dermatitis related to fecal or urinary stoma or fistula: Secondary | ICD-10-CM

## 2022-11-05 ENCOUNTER — Ambulatory Visit (HOSPITAL_COMMUNITY)
Admission: RE | Admit: 2022-11-05 | Discharge: 2022-11-05 | Disposition: A | Discharge status: Home / Self Care | Payer: Medicare HMO | Source: Ambulatory Visit | Attending: Physician Assistant | Admitting: Physician Assistant

## 2022-11-05 DIAGNOSIS — L24B3 Irritant contact dermatitis related to fecal or urinary stoma or fistula: Secondary | ICD-10-CM | POA: Diagnosis not present

## 2022-11-05 DIAGNOSIS — K9403 Colostomy malfunction: Secondary | ICD-10-CM | POA: Diagnosis not present

## 2022-11-05 DIAGNOSIS — Z433 Encounter for attention to colostomy: Secondary | ICD-10-CM | POA: Diagnosis not present

## 2022-11-05 NOTE — Progress Notes (Signed)
[facility]   Reason for visit:  LMQ colostomy HPI:   Past Medical History:  Diagnosis Date  . Acid reflux   . Arthritis   . Chest pain   . Fatigue   . GERD (gastroesophageal reflux disease)   . Hyperlipidemia    Family History  Adopted: Yes  Problem Relation Age of Onset  . Asthma Brother   . Stomach cancer Brother   . Clotting disorder Son        blood clot   Allergies  Allergen Reactions  . Codeine Palpitations   Current Outpatient Medications  Medication Sig Dispense Refill Last Dose  . cycloSPORINE (RESTASIS) 0.05 % ophthalmic emulsion Place 1 drop into both eyes as needed (for dry eyes).     . Glycerin-Polysorbate 80 (REFRESH DRY EYE THERAPY OP) Apply 1 drop to eye daily as needed.     . lactulose (CHRONULAC) 10 GM/15ML solution Place 30 mLs (20 g total) into feeding tube 3 (three) times daily. 236 mL 0   . lidocaine (XYLOCAINE) 5 % ointment Apply topically 3 (three) times daily as needed for mild pain or moderate pain. 35.44 g 0   . Multiple Vitamin (MULTIVITAMIN WITH MINERALS) TABS tablet Take 2 tablets by mouth daily.     Marland Kitchen omeprazole (PRILOSEC) 40 MG capsule Take 40 mg by mouth daily. (Patient not taking: Reported on 08/08/2022)     . traMADol (ULTRAM) 50 MG tablet Take 0.5 tablets (25 mg total) by mouth every 12 (twelve) hours as needed for severe pain. 30 tablet 0    No current facility-administered medications for this encounter.   ROS  Review of Systems  Constitutional: Negative.   Gastrointestinal:        LMQ colostomy Takes lactulose daily  Musculoskeletal:        Uses wheelchair in hospital due to weakness  Skin:  Positive for color change.  All other systems reviewed and are negative. Vital signs:  BP 117/84 (BP Location: Right Arm)   Pulse 82   Temp 98 F (36.7 C) (Oral)   Resp 20   SpO2 97%  Exam:  Physical Exam Vitals reviewed.  Constitutional:      Appearance: Normal appearance.  Abdominal:     Palpations: Abdomen is  soft.     Comments: Creasing around stoma  Skin:    General: Skin is warm and dry.  Neurological:     Mental Status: She is alert and oriented to person, place, and time.  Psychiatric:        Mood and Affect: Mood normal.        Behavior: Behavior normal.    Stoma type/location:  LMQ colostomy, stenosis Stomal assessment/size:  7/8" pink and moist Peristomal assessment:  creasing at 3 and 9 o'clock, extends to umbilicus, creating deep valley Treatment options for peristomal skin: 1/2 barrier ring around stoma and the other 1/2 piece formed to fill in umbilical valley Output: soft brown stool Ostomy pouching: 1 3/4" 2pc. With barrier ring Education provided:  Will set up again with [facility] as she has not heard from them. I provide her with the phone number as I fear she may not have answered their phone call.      Impression/dx  Colostomy with stenosis Discussion  Prefers clip pouch Plan   See back as needed    Visit time: 45 minutes.   Maple Hudson FNP-BC

## 2022-11-05 NOTE — Discharge Instructions (Signed)
No Changes Will set up with [facility]  [phone]

## 2022-11-06 DIAGNOSIS — K9403 Colostomy malfunction: Secondary | ICD-10-CM | POA: Insufficient documentation

## 2022-11-08 DIAGNOSIS — Z933 Colostomy status: Secondary | ICD-10-CM | POA: Diagnosis not present

## 2022-11-08 DIAGNOSIS — L24B3 Irritant contact dermatitis related to fecal or urinary stoma or fistula: Secondary | ICD-10-CM | POA: Diagnosis not present

## 2022-11-19 DIAGNOSIS — K5900 Constipation, unspecified: Secondary | ICD-10-CM | POA: Diagnosis not present

## 2022-11-26 ENCOUNTER — Ambulatory Visit (HOSPITAL_COMMUNITY)
Admission: RE | Admit: 2022-11-26 | Discharge: 2022-11-26 | Disposition: A | Discharge status: Home / Self Care | Payer: Medicare HMO | Source: Ambulatory Visit | Attending: Plastic Surgery | Admitting: Plastic Surgery

## 2022-11-26 DIAGNOSIS — K5909 Other constipation: Secondary | ICD-10-CM | POA: Diagnosis not present

## 2022-11-26 DIAGNOSIS — K9400 Colostomy complication, unspecified: Secondary | ICD-10-CM

## 2022-11-26 DIAGNOSIS — K5901 Slow transit constipation: Secondary | ICD-10-CM

## 2022-11-26 DIAGNOSIS — Z433 Encounter for attention to colostomy: Secondary | ICD-10-CM | POA: Insufficient documentation

## 2022-11-26 NOTE — Progress Notes (Signed)
[facility]   Reason for visit:  LMQ colostomy HPI:   Past Medical History:  Diagnosis Date   Acid reflux    Arthritis    Chest pain    Fatigue    GERD (gastroesophageal reflux disease)    Hyperlipidemia    Family History  Adopted: Yes  Problem Relation Age of Onset   Asthma Brother    Stomach cancer Brother    Clotting disorder Son        blood clot   Allergies  Allergen Reactions   Codeine Palpitations   Current Outpatient Medications  Medication Sig Dispense Refill Last Dose   cycloSPORINE (RESTASIS) 0.05 % ophthalmic emulsion Place 1 drop into both eyes as needed (for dry eyes).      Glycerin-Polysorbate 80 (REFRESH DRY EYE THERAPY OP) Apply 1 drop to eye daily as needed.      lactulose (CHRONULAC) 10 GM/15ML solution Place 30 mLs (20 g total) into feeding tube 3 (three) times daily. 236 mL 0    lidocaine (XYLOCAINE) 5 % ointment Apply topically 3 (three) times daily as needed for mild pain or moderate pain. 35.44 g 0    Multiple Vitamin (MULTIVITAMIN WITH MINERALS) TABS tablet Take 2 tablets by mouth daily.      omeprazole (PRILOSEC) 40 MG capsule Take 40 mg by mouth daily. (Patient not taking: Reported on 08/08/2022)      traMADol (ULTRAM) 50 MG tablet Take 0.5 tablets (25 mg total) by mouth every 12 (twelve) hours as needed for severe pain. 30 tablet 0    No current facility-administered medications for this encounter.   ROS  Review of Systems  Gastrointestinal:        LMQ colostomy Chronic constipation  Musculoskeletal:        USes wheelchair in hospital and for long distances   Skin:  Positive for color change.  Psychiatric/Behavioral: Negative.    All other systems reviewed and are negative.  Vital signs:  BP 131/81 (BP Location: Right Arm)   Pulse 93   Temp 98.5 F (36.9 C) (Oral)   Resp 19   SpO2 100%  Exam:  Physical Exam Vitals reviewed.  Constitutional:      Appearance: Normal appearance.  Abdominal:     Palpations: Abdomen is  soft.  Skin:    General: Skin is warm and dry.     Findings: Erythema present.  Neurological:     Mental Status: She is alert and oriented to person, place, and time.  Psychiatric:        Mood and Affect: Mood normal.        Behavior: Behavior normal.     Stoma type/location:  LMQ colostomy Stomal assessment/size:  retracted 7/8" pink stoma,  Peristomal assessment:  pink, improved, creasing at 3 and 9 o'clock, extends to umbilicus Treatment options for stomal/peristomal skin: barrier ring Output: thick pasty stool.  Sticks to stoma and can be challenging to empty.  Ostomy pouching: 2pc. 1 3/4" with barrier ring and barrier strips.   Education provided:  finally receiving supplies from [facility].     Impression/dx  Colostomy complication Discussion  Continue same pouching Daily lactulose  may try miralax for ongoing constipation Plan  See back as needed     Visit time: 35 minutes.   Maple Hudson FNP-BC

## 2022-11-26 NOTE — Discharge Instructions (Addendum)
Adding Miralax every other day. FOr pancaking Continue lactulose daily [facility] monthly for supplies   [phone]

## 2022-12-04 ENCOUNTER — Ambulatory Visit (HOSPITAL_COMMUNITY): Payer: Medicare HMO | Admitting: Nurse Practitioner

## 2022-12-10 DIAGNOSIS — M1990 Unspecified osteoarthritis, unspecified site: Secondary | ICD-10-CM | POA: Diagnosis not present

## 2022-12-10 DIAGNOSIS — Z0001 Encounter for general adult medical examination with abnormal findings: Secondary | ICD-10-CM | POA: Diagnosis not present

## 2022-12-10 DIAGNOSIS — E782 Mixed hyperlipidemia: Secondary | ICD-10-CM | POA: Diagnosis not present

## 2022-12-10 DIAGNOSIS — K219 Gastro-esophageal reflux disease without esophagitis: Secondary | ICD-10-CM | POA: Diagnosis not present

## 2022-12-10 DIAGNOSIS — J302 Other seasonal allergic rhinitis: Secondary | ICD-10-CM | POA: Diagnosis not present

## 2022-12-10 DIAGNOSIS — K581 Irritable bowel syndrome with constipation: Secondary | ICD-10-CM | POA: Diagnosis not present

## 2022-12-10 DIAGNOSIS — Z720 Tobacco use: Secondary | ICD-10-CM | POA: Diagnosis not present

## 2022-12-11 DIAGNOSIS — L24B3 Irritant contact dermatitis related to fecal or urinary stoma or fistula: Secondary | ICD-10-CM | POA: Diagnosis not present

## 2022-12-11 DIAGNOSIS — Z933 Colostomy status: Secondary | ICD-10-CM | POA: Diagnosis not present

## 2022-12-18 DIAGNOSIS — E782 Mixed hyperlipidemia: Secondary | ICD-10-CM | POA: Diagnosis not present

## 2022-12-18 DIAGNOSIS — Z720 Tobacco use: Secondary | ICD-10-CM | POA: Diagnosis not present

## 2022-12-18 DIAGNOSIS — M1990 Unspecified osteoarthritis, unspecified site: Secondary | ICD-10-CM | POA: Diagnosis not present

## 2022-12-18 DIAGNOSIS — J302 Other seasonal allergic rhinitis: Secondary | ICD-10-CM | POA: Diagnosis not present

## 2022-12-18 DIAGNOSIS — K219 Gastro-esophageal reflux disease without esophagitis: Secondary | ICD-10-CM | POA: Diagnosis not present

## 2022-12-18 DIAGNOSIS — K581 Irritable bowel syndrome with constipation: Secondary | ICD-10-CM | POA: Diagnosis not present

## 2023-01-09 DIAGNOSIS — L24B3 Irritant contact dermatitis related to fecal or urinary stoma or fistula: Secondary | ICD-10-CM | POA: Diagnosis not present

## 2023-01-09 DIAGNOSIS — Z933 Colostomy status: Secondary | ICD-10-CM | POA: Diagnosis not present

## 2023-01-23 DIAGNOSIS — K561 Intussusception: Secondary | ICD-10-CM | POA: Diagnosis not present

## 2023-01-23 DIAGNOSIS — Z433 Encounter for attention to colostomy: Secondary | ICD-10-CM | POA: Diagnosis not present

## 2023-01-23 DIAGNOSIS — K5904 Chronic idiopathic constipation: Secondary | ICD-10-CM | POA: Diagnosis not present

## 2023-02-09 DIAGNOSIS — Z933 Colostomy status: Secondary | ICD-10-CM | POA: Diagnosis not present

## 2023-02-09 DIAGNOSIS — L24B3 Irritant contact dermatitis related to fecal or urinary stoma or fistula: Secondary | ICD-10-CM | POA: Diagnosis not present

## 2023-02-13 DIAGNOSIS — H524 Presbyopia: Secondary | ICD-10-CM | POA: Diagnosis not present

## 2023-03-16 DIAGNOSIS — L24B3 Irritant contact dermatitis related to fecal or urinary stoma or fistula: Secondary | ICD-10-CM | POA: Diagnosis not present

## 2023-03-16 DIAGNOSIS — Z933 Colostomy status: Secondary | ICD-10-CM | POA: Diagnosis not present

## 2023-03-19 DIAGNOSIS — M1990 Unspecified osteoarthritis, unspecified site: Secondary | ICD-10-CM | POA: Diagnosis not present

## 2023-03-19 DIAGNOSIS — E782 Mixed hyperlipidemia: Secondary | ICD-10-CM | POA: Diagnosis not present

## 2023-03-19 DIAGNOSIS — K581 Irritable bowel syndrome with constipation: Secondary | ICD-10-CM | POA: Diagnosis not present

## 2023-03-19 DIAGNOSIS — K219 Gastro-esophageal reflux disease without esophagitis: Secondary | ICD-10-CM | POA: Diagnosis not present

## 2023-03-19 DIAGNOSIS — Z720 Tobacco use: Secondary | ICD-10-CM | POA: Diagnosis not present

## 2023-03-19 DIAGNOSIS — Z933 Colostomy status: Secondary | ICD-10-CM | POA: Diagnosis not present

## 2023-03-19 DIAGNOSIS — J302 Other seasonal allergic rhinitis: Secondary | ICD-10-CM | POA: Diagnosis not present

## 2023-04-03 DIAGNOSIS — Z1231 Encounter for screening mammogram for malignant neoplasm of breast: Secondary | ICD-10-CM | POA: Diagnosis not present

## 2023-04-12 DIAGNOSIS — Z933 Colostomy status: Secondary | ICD-10-CM | POA: Diagnosis not present

## 2023-04-12 DIAGNOSIS — L24B3 Irritant contact dermatitis related to fecal or urinary stoma or fistula: Secondary | ICD-10-CM | POA: Diagnosis not present

## 2023-05-13 DIAGNOSIS — Z933 Colostomy status: Secondary | ICD-10-CM | POA: Diagnosis not present

## 2023-05-13 DIAGNOSIS — L24B3 Irritant contact dermatitis related to fecal or urinary stoma or fistula: Secondary | ICD-10-CM | POA: Diagnosis not present

## 2023-05-14 DIAGNOSIS — R410 Disorientation, unspecified: Secondary | ICD-10-CM | POA: Diagnosis not present

## 2023-05-14 DIAGNOSIS — Z433 Encounter for attention to colostomy: Secondary | ICD-10-CM | POA: Diagnosis not present

## 2023-05-15 ENCOUNTER — Encounter: Payer: Self-pay | Admitting: Physician Assistant

## 2023-05-20 DIAGNOSIS — I69911 Memory deficit following unspecified cerebrovascular disease: Secondary | ICD-10-CM | POA: Diagnosis not present

## 2023-05-20 DIAGNOSIS — Z1211 Encounter for screening for malignant neoplasm of colon: Secondary | ICD-10-CM | POA: Diagnosis not present

## 2023-06-04 DIAGNOSIS — K219 Gastro-esophageal reflux disease without esophagitis: Secondary | ICD-10-CM | POA: Diagnosis not present

## 2023-06-04 DIAGNOSIS — E782 Mixed hyperlipidemia: Secondary | ICD-10-CM | POA: Diagnosis not present

## 2023-06-04 DIAGNOSIS — Z720 Tobacco use: Secondary | ICD-10-CM | POA: Diagnosis not present

## 2023-06-04 DIAGNOSIS — Z933 Colostomy status: Secondary | ICD-10-CM | POA: Diagnosis not present

## 2023-06-04 DIAGNOSIS — M1990 Unspecified osteoarthritis, unspecified site: Secondary | ICD-10-CM | POA: Diagnosis not present

## 2023-06-04 DIAGNOSIS — J302 Other seasonal allergic rhinitis: Secondary | ICD-10-CM | POA: Diagnosis not present

## 2023-06-04 DIAGNOSIS — K581 Irritable bowel syndrome with constipation: Secondary | ICD-10-CM | POA: Diagnosis not present

## 2023-06-05 ENCOUNTER — Other Ambulatory Visit: Payer: Self-pay | Admitting: Physician Assistant

## 2023-06-05 DIAGNOSIS — Z13820 Encounter for screening for osteoporosis: Secondary | ICD-10-CM

## 2023-06-12 DIAGNOSIS — Z933 Colostomy status: Secondary | ICD-10-CM | POA: Diagnosis not present

## 2023-06-12 DIAGNOSIS — L24B3 Irritant contact dermatitis related to fecal or urinary stoma or fistula: Secondary | ICD-10-CM | POA: Diagnosis not present

## 2023-06-16 NOTE — Progress Notes (Signed)
 Assessment/Plan:     Sarah Moore is a very pleasant 73 y.o. year old RH female with a history of hypertension, hyperlipidemia, history of acute metabolic encephalopathy due to hypoglycemia and hyperbilirubinemia in April and May 2024, history of CVA April 2024 with septic shock, history of DVT November 2008, seen today for evaluation of memory loss. MoCA today is  /30***.  Workup is in progress, but findings are suspicious for dementia*** Patient needs assistance with some ADLs.  Patient no longer drives.  Memory impairment with behavioral disturbance, unclear etiology.***  MRI brain without contrast to assess for underlying structural abnormality and assess vascular load  Check B12, TSH Recommend good control of cardiovascular risk factors.   Continue to control mood as per PCP Folllow up in    Subjective:    The patient is accompanied by ***  who supplements the history.    How long did patient have memory difficulties?  For the last***.  Patient has difficulty remembering new information, recent conversations and names of people. repeats oneself?  Endorsed Disoriented when walking into a room?  Denies except occasionally not remembering what patient came to the room for ***  Leaving objects in unusual places?   Denies.  Wandering behavior? Denies.   Any personality changes, or depression, anxiety?  Has moments of irritability.*** Hallucinations or paranoia?  Denies.   Seizures? Denies.    Any sleep changes?  Sleeps well***does not sleep well***denies vivid dreams, REM behavior or sleepwalking.   Sleep apnea? Denies.   Any hygiene concerns?  Denies.   Independent of bathing and dressing?  Needs assistance getting dressed.*** Who is in charge of the medications? is in charge *** Who is in charge of the finances?   is in charge   *** Any changes in appetite?   Denies. ***   Patient have trouble swallowing?  History of GERD Does the patient cook?  No***  Any headaches?   Denies.   Chronic back pain?  Denies.   Ambulates with difficulty? Needs a walker to ambulate ***   Recent falls or head injuries? Denies.     Vision changes? Denies. Stroke like symptoms?  Denies.   Any tremors?  Denies.   Any anosmia?  Denies.   Any incontinence of urine? Denies.   Any bowel dysfunction?  She has a history of intussusception 2024, status post colostomy January 2025 Patient lives  ***  History of heavy alcohol intake? Denies.   History of heavy tobacco use? Denies.   Family history of dementia?   ***Denies. Does patient drive?***No longer drives  Allergies  Allergen Reactions   Codeine Palpitations    Current Outpatient Medications  Medication Instructions   cycloSPORINE (RESTASIS) 0.05 % ophthalmic emulsion 1 drop, Both Eyes, As needed   Glycerin-Polysorbate 80 (REFRESH DRY EYE THERAPY OP) 1 drop, Ophthalmic, Daily PRN   lactulose (CHRONULAC) 20 g, Per Tube, 3 times daily   lidocaine (XYLOCAINE) 5 % ointment Topical, 3 times daily PRN   Multiple Vitamin (MULTIVITAMIN WITH MINERALS) TABS tablet 2 tablets, Oral, Daily   omeprazole (PRILOSEC) 40 mg, Daily   traMADol (ULTRAM) 25 mg, Oral, Every 12 hours PRN     VITALS:  There were no vitals filed for this visit.    PHYSICAL EXAM   HEENT:  Normocephalic, atraumatic. The mucous membranes are moist. The superficial temporal arteries are without ropiness or tenderness. Cardiovascular: Regular rate and rhythm. Lungs: Clear to auscultation bilaterally. Neck: There are no carotid bruits noted  bilaterally.  NEUROLOGICAL:     No data to display              No data to display           Orientation:  Alert and oriented to person, not to place and time***. No aphasia or dysarthria. Fund of knowledge is reduced. Recent and remote memory impaired.  Attention and concentration are reduced.  Able to name objects and unable to repeat phrases. Delayed recall    Cranial nerves: There is good facial symmetry.  Extraocular muscles are intact and visual fields are full to confrontational testing. Speech is fluent and clear, no tongue deviation. Hearing is intact to conversational tone.*** Tone: Tone is good throughout. Sensation: Sensation is intact to light touch and pinprick throughout. Vibration is intact at the bilateral big toe. Coordination: The patient has no difficulty with RAM's or FNF bilaterally. Normal finger to nose  Motor: Strength is 5/5 in the bilateral upper and lower extremities. There is no pronator drift. There are no fasciculations noted. DTR's: Deep tendon reflexes are 2/4 .  Plantar responses are downgoing bilaterally. Gait and Station: The patient is able to ambulate with difficulty. Needs a walker to ambulate ***. Gait is cautious and narrow.      Thank you for allowing [place] the opportunity to participate in the care of this nice patient. Please do not hesitate to contact us for any questions or concerns.   Total time spent on today's visit was *** minutes dedicated to this patient today, preparing to see patient, examining the patient, ordering tests and/or medications and counseling the patient, documenting clinical information in the EHR or other health record, independently interpreting results and communicating results to the patient/family, discussing treatment and goals, answering patient's questions and coordinating care.  Cc:  Norm Salt, [place]  Marlowe Kays 06/16/2023 11:03 AM

## 2023-06-17 ENCOUNTER — Other Ambulatory Visit

## 2023-06-17 ENCOUNTER — Ambulatory Visit: Payer: Medicare HMO

## 2023-06-17 ENCOUNTER — Encounter: Payer: Self-pay | Admitting: Physician Assistant

## 2023-06-17 ENCOUNTER — Ambulatory Visit (INDEPENDENT_AMBULATORY_CARE_PROVIDER_SITE_OTHER): Payer: Medicare HMO | Admitting: Physician Assistant

## 2023-06-17 VITALS — BP 95/63 | HR 106 | Resp 20 | Ht 63.0 in | Wt 100.0 lb

## 2023-06-17 DIAGNOSIS — R413 Other amnesia: Secondary | ICD-10-CM | POA: Diagnosis not present

## 2023-06-17 MED ORDER — DIAZEPAM 2 MG PO TABS: ORAL_TABLET | ORAL | 0 refills | Status: DC

## 2023-06-17 NOTE — Patient Instructions (Addendum)
 It was a pleasure to see you today at our office.   Recommendations:   MRI of the brain, the radiology office will call you to arrange you appointment   Consider changing omeprazole to an alternative medication (i.e. lansoprazole) as there is evidence that if taking greater than 3 years, can contribute to memory difficulties.  Check labs today  Follow up in 3  months   For psychiatric meds, mood meds: Please have your primary care physician manage these medications.  If you have any severe symptoms of a stroke, or other severe issues such as confusion,severe chills or fever, etc call 911 or go to the ER as you may need to be evaluated further    For assessment of decision of mental capacity and competency:  Call Dr. Erick Blinks, geriatric psychiatrist at [phone]    Whom to call: Memory  decline, memory medications: Call our office [phone]    [email or web address]   RECOMMENDATIONS FOR ALL PATIENTS WITH MEMORY PROBLEMS: 1. Continue to exercise (Recommend 30 minutes of walking everyday, or 3 hours every week) 2. Increase social interactions - continue going to [place] and enjoy social gatherings with friends and family 3. Eat healthy, avoid fried foods and eat more fruits and vegetables 4. Maintain adequate blood pressure, blood sugar, and blood cholesterol level. Reducing the risk of stroke and cardiovascular disease also helps promoting better memory. 5. Avoid stressful situations. Live a simple life and avoid aggravations. Organize your time and prepare for the next day in anticipation. 6. Sleep well, avoid any interruptions of sleep and avoid any distractions in the bedroom that may interfere with adequate sleep quality 7. Avoid sugar, avoid sweets as there is a strong link between excessive sugar intake, diabetes, and cognitive impairment We discussed the Mediterranean diet, which has been shown to help patients  reduce the risk of progressive memory disorders and reduces cardiovascular risk. This includes eating fish, eat fruits and green leafy vegetables, nuts like almonds and hazelnuts, walnuts, and also use olive oil. Avoid fast foods and fried foods as much as possible. Avoid sweets and sugar as sugar use has been linked to worsening of memory function.  There is always a concern of gradual progression of memory problems. If this is the case, then we may need to adjust level of care according to patient needs. Support, both to the patient and caregiver, should then be put into place.        DRIVING: Regarding driving, in patients with progressive memory problems, driving will be impaired. We advise to have someone else do the driving if trouble finding directions or if minor accidents are reported. Independent driving assessment is available to determine safety of driving.   If you are interested in the driving assessment, you can contact the following:  [facility] [employer] in [place] [phone]  [facility] [phone]  [facility] [phone]  [facility] [phone] or [phone]   FALL PRECAUTIONS: Be cautious when walking. Scan the area for obstacles that may increase the risk of trips and falls. When getting up in the mornings, sit up at the edge of the bed for a few minutes before getting out of bed. Consider elevating the bed at the head end to avoid drop of blood pressure when getting up. Walk always in a well-lit room (use night lights in the walls). Avoid area rugs or power cords from appliances in the middle of the walkways. Use a walker or a cane if necessary and consider  physical therapy for balance exercise. Get your eyesight checked regularly.  FINANCIAL OVERSIGHT: Supervision, especially oversight when making financial decisions or transactions is also recommended.  HOME SAFETY: Consider the safety of the kitchen when operating  appliances like stoves, microwave oven, and blender. Consider having supervision and share cooking responsibilities until no longer able to participate in those. Accidents with firearms and other hazards in the house should be identified and addressed as well.   ABILITY TO BE LEFT ALONE: If patient is unable to contact 911 operator, consider using LifeLine, or when the need is there, arrange for someone to stay with patients. Smoking is a fire hazard, consider supervision or cessation. Risk of wandering should be assessed by caregiver and if detected at any point, supervision and safe proof recommendations should be instituted.  MEDICATION SUPERVISION: Inability to self-administer medication needs to be constantly addressed. Implement a mechanism to ensure safe administration of the medications.      Mediterranean Diet A Mediterranean diet refers to food and lifestyle choices that are based on the traditions of countries located on the [place]. This way of eating has been shown to help prevent certain conditions and improve outcomes for people who have chronic diseases, like kidney disease and heart disease. What are tips for following this plan? Lifestyle  Cook and eat meals together with your family, when possible. Drink enough fluid to keep your urine clear or pale yellow. Be physically active every day. This includes: Aerobic exercise like running or swimming. Leisure activities like gardening, walking, or housework. Get 7-8 hours of sleep each night. If recommended by your health care provider, drink red wine in moderation. This means 1 glass a day for nonpregnant women and 2 glasses a day for men. A glass of wine equals 5 oz (150 mL). Reading food labels  Check the serving size of packaged foods. For foods such as rice and pasta, the serving size refers to the amount of cooked product, not dry. Check the total fat in packaged foods. Avoid foods that have saturated fat or trans  fats. Check the ingredients list for added sugars, such as corn syrup. Shopping  At the grocery store, buy most of your food from the areas near the walls of the store. This includes: Fresh fruits and vegetables (produce). Grains, beans, nuts, and seeds. Some of these may be available in unpackaged forms or large amounts (in bulk). Fresh seafood. Poultry and eggs. Low-fat dairy products. Buy whole ingredients instead of prepackaged foods. Buy fresh fruits and vegetables in-season from local farmers markets. Buy frozen fruits and vegetables in resealable bags. If you do not have access to quality fresh seafood, buy precooked frozen shrimp or canned fish, such as tuna, salmon, or sardines. Buy small amounts of raw or cooked vegetables, salads, or olives from the deli or salad bar at your store. Stock your pantry so you always have certain foods on hand, such as olive oil, canned tuna, canned tomatoes, rice, pasta, and beans. Cooking  Cook foods with extra-virgin olive oil instead of using butter or other vegetable oils. Have meat as a side dish, and have vegetables or grains as your main dish. This means having meat in small portions or adding small amounts of meat to foods like pasta or stew. Use beans or vegetables instead of meat in common dishes like chili or lasagna. Experiment with different cooking methods. Try roasting or broiling vegetables instead of steaming or sauteing them. Add frozen vegetables to soups, stews, pasta, or  rice. Add nuts or seeds for added healthy fat at each meal. You can add these to yogurt, salads, or vegetable dishes. Marinate fish or vegetables using olive oil, lemon juice, garlic, and fresh herbs. Meal planning  Plan to eat 1 vegetarian meal one day each week. Try to work up to 2 vegetarian meals, if possible. Eat seafood 2 or more times a week. Have healthy snacks readily available, such as: Vegetable sticks with hummus. Greek yogurt. Fruit and nut  trail mix. Eat balanced meals throughout the week. This includes: Fruit: 2-3 servings a day Vegetables: 4-5 servings a day Low-fat dairy: 2 servings a day Fish, poultry, or lean meat: 1 serving a day Beans and legumes: 2 or more servings a week Nuts and seeds: 1-2 servings a day Whole grains: 6-8 servings a day Extra-virgin olive oil: 3-4 servings a day Limit red meat and sweets to only a few servings a month What are my food choices? Mediterranean diet Recommended Grains: Whole-grain pasta. Brown rice. Bulgar wheat. Polenta. Couscous. Whole-wheat bread. Orpah Cobb. Vegetables: Artichokes. Beets. Broccoli. Cabbage. Carrots. Eggplant. Green beans. Chard. Kale. Spinach. Onions. Leeks. Peas. Squash. Tomatoes. Peppers. Radishes. Fruits: Apples. Apricots. Avocado. Berries. Bananas. Cherries. Dates. Figs. Grapes. Lemons. Melon. Oranges. Peaches. Plums. Pomegranate. Meats and other protein foods: Beans. Almonds. Sunflower seeds. Pine nuts. Peanuts. Cod. Salmon. Scallops. Shrimp. Tuna. Tilapia. Clams. Oysters. Eggs. Dairy: Low-fat milk. Cheese. Greek yogurt. Beverages: Water. Red wine. Herbal tea. Fats and oils: Extra virgin olive oil. Avocado oil. Grape seed oil. Sweets and desserts: [place] yogurt with honey. Baked apples. Poached pears. Trail mix. Seasoning and other foods: Basil. Cilantro. Coriander. Cumin. Mint. Parsley. Sage. Rosemary. Tarragon. Garlic. Oregano. Thyme. Pepper. Balsalmic vinegar. Tahini. Hummus. Tomato sauce. Olives. Mushrooms. Limit these Grains: Prepackaged pasta or rice dishes. Prepackaged cereal with added sugar. Vegetables: Deep fried potatoes (french fries). Fruits: Fruit canned in syrup. Meats and other protein foods: Beef. Pork. Lamb. Poultry with skin. Hot dogs. Tomasa Blase. Dairy: Ice cream. Sour cream. Whole milk. Beverages: Juice. Sugar-sweetened soft drinks. Beer. Liquor and spirits. Fats and oils: Butter. Canola oil. Vegetable oil. Beef fat (tallow).  Lard. Sweets and desserts: Cookies. Cakes. Pies. Candy. Seasoning and other foods: Mayonnaise. Premade sauces and marinades. The items listed may not be a complete list. Talk with your dietitian about what dietary choices are right for you. Summary The Mediterranean diet includes both food and lifestyle choices. Eat a variety of fresh fruits and vegetables, beans, nuts, seeds, and whole grains. Limit the amount of red meat and sweets that you eat. Talk with your health care provider about whether it is safe for you to drink red wine in moderation. This means 1 glass a day for nonpregnant women and 2 glasses a day for men. A glass of wine equals 5 oz (150 mL). This information is not intended to replace advice given to you by your health care provider. Make sure you discuss any questions you have with your health care provider. Document Released: 11/24/2015 Document Revised: 12/27/2015 Document Reviewed: 11/24/2015 Elsevier Interactive Patient Education  2017 [employer].

## 2023-06-18 ENCOUNTER — Telehealth: Payer: Self-pay | Admitting: Physician Assistant

## 2023-06-18 LAB — VITAMIN B12: Vitamin B-12: 790 pg/mL (ref 200–1100)

## 2023-06-18 LAB — TSH: TSH: 1.18 m[IU]/L (ref 0.40–4.50)

## 2023-06-18 NOTE — Telephone Encounter (Signed)
 Caller stated she would like to speak with nurse concerning medication.

## 2023-06-18 NOTE — Telephone Encounter (Signed)
 Was asking about MRI, it is scheduled. Thanked me for calling.

## 2023-07-02 DIAGNOSIS — M1990 Unspecified osteoarthritis, unspecified site: Secondary | ICD-10-CM | POA: Diagnosis not present

## 2023-07-02 DIAGNOSIS — Z720 Tobacco use: Secondary | ICD-10-CM | POA: Diagnosis not present

## 2023-07-02 DIAGNOSIS — J302 Other seasonal allergic rhinitis: Secondary | ICD-10-CM | POA: Diagnosis not present

## 2023-07-02 DIAGNOSIS — K219 Gastro-esophageal reflux disease without esophagitis: Secondary | ICD-10-CM | POA: Diagnosis not present

## 2023-07-02 DIAGNOSIS — E782 Mixed hyperlipidemia: Secondary | ICD-10-CM | POA: Diagnosis not present

## 2023-07-02 DIAGNOSIS — E559 Vitamin D deficiency, unspecified: Secondary | ICD-10-CM | POA: Diagnosis not present

## 2023-07-02 DIAGNOSIS — K581 Irritable bowel syndrome with constipation: Secondary | ICD-10-CM | POA: Diagnosis not present

## 2023-07-03 ENCOUNTER — Encounter: Payer: Self-pay | Admitting: Physician Assistant

## 2023-07-08 ENCOUNTER — Ambulatory Visit
Admission: RE | Admit: 2023-07-08 | Discharge: 2023-07-08 | Disposition: A | Discharge status: Home / Self Care | Source: Ambulatory Visit | Attending: Physician Assistant | Admitting: Physician Assistant

## 2023-07-08 ENCOUNTER — Other Ambulatory Visit

## 2023-07-08 DIAGNOSIS — R413 Other amnesia: Secondary | ICD-10-CM | POA: Diagnosis not present

## 2023-07-13 DIAGNOSIS — Z933 Colostomy status: Secondary | ICD-10-CM | POA: Diagnosis not present

## 2023-07-13 DIAGNOSIS — L24B3 Irritant contact dermatitis related to fecal or urinary stoma or fistula: Secondary | ICD-10-CM | POA: Diagnosis not present

## 2023-08-13 DIAGNOSIS — K219 Gastro-esophageal reflux disease without esophagitis: Secondary | ICD-10-CM | POA: Diagnosis not present

## 2023-08-13 DIAGNOSIS — Z933 Colostomy status: Secondary | ICD-10-CM | POA: Diagnosis not present

## 2023-08-13 DIAGNOSIS — Z720 Tobacco use: Secondary | ICD-10-CM | POA: Diagnosis not present

## 2023-08-13 DIAGNOSIS — K581 Irritable bowel syndrome with constipation: Secondary | ICD-10-CM | POA: Diagnosis not present

## 2023-08-13 DIAGNOSIS — E559 Vitamin D deficiency, unspecified: Secondary | ICD-10-CM | POA: Diagnosis not present

## 2023-08-13 DIAGNOSIS — L24B3 Irritant contact dermatitis related to fecal or urinary stoma or fistula: Secondary | ICD-10-CM | POA: Diagnosis not present

## 2023-08-13 DIAGNOSIS — J302 Other seasonal allergic rhinitis: Secondary | ICD-10-CM | POA: Diagnosis not present

## 2023-08-13 DIAGNOSIS — M1990 Unspecified osteoarthritis, unspecified site: Secondary | ICD-10-CM | POA: Diagnosis not present

## 2023-08-13 DIAGNOSIS — E782 Mixed hyperlipidemia: Secondary | ICD-10-CM | POA: Diagnosis not present

## 2023-09-13 DIAGNOSIS — Z933 Colostomy status: Secondary | ICD-10-CM | POA: Diagnosis not present

## 2023-09-13 DIAGNOSIS — L24B3 Irritant contact dermatitis related to fecal or urinary stoma or fistula: Secondary | ICD-10-CM | POA: Diagnosis not present

## 2023-09-17 ENCOUNTER — Encounter: Payer: Self-pay | Admitting: Physician Assistant

## 2023-09-17 ENCOUNTER — Ambulatory Visit (INDEPENDENT_AMBULATORY_CARE_PROVIDER_SITE_OTHER): Admitting: Physician Assistant

## 2023-09-17 VITALS — BP 108/74 | HR 90 | Ht 62.0 in | Wt 101.8 lb

## 2023-09-17 DIAGNOSIS — R413 Other amnesia: Secondary | ICD-10-CM | POA: Diagnosis not present

## 2023-09-17 NOTE — Patient Instructions (Addendum)
 It was a pleasure to see you today at our office.   Recommendations:   Follow up Jan 13 at 1 pm    For psychiatric meds, mood meds: Please have your primary care physician manage these medications.  If you have any severe symptoms of a stroke, or other severe issues such as confusion,severe chills or fever, etc call 911 or go to the ER as you may need to be evaluated further    For assessment of decision of mental capacity and competency:  Call Dr. Laverne Potter, geriatric psychiatrist at [phone]    Whom to call: Memory  decline, memory medications: Call our office [phone]    [email or web address]   RECOMMENDATIONS FOR ALL PATIENTS WITH MEMORY PROBLEMS: 1. Continue to exercise (Recommend 30 minutes of walking everyday, or 3 hours every week) 2. Increase social interactions - continue going to [place] and enjoy social gatherings with friends and family 3. Eat healthy, avoid fried foods and eat more fruits and vegetables 4. Maintain adequate blood pressure, blood sugar, and blood cholesterol level. Reducing the risk of stroke and cardiovascular disease also helps promoting better memory. 5. Avoid stressful situations. Live a simple life and avoid aggravations. Organize your time and prepare for the next day in anticipation. 6. Sleep well, avoid any interruptions of sleep and avoid any distractions in the bedroom that may interfere with adequate sleep quality 7. Avoid sugar, avoid sweets as there is a strong link between excessive sugar intake, diabetes, and cognitive impairment We discussed the Mediterranean diet, which has been shown to help patients reduce the risk of progressive memory disorders and reduces cardiovascular risk. This includes eating fish, eat fruits and green leafy vegetables, nuts like almonds and hazelnuts, walnuts, and also use olive oil. Avoid fast foods and fried foods as much as possible. Avoid sweets  and sugar as sugar use has been linked to worsening of memory function.  There is always a concern of gradual progression of memory problems. If this is the case, then we may need to adjust level of care according to patient needs. Support, both to the patient and caregiver, should then be put into place.        DRIVING: Regarding driving, in patients with progressive memory problems, driving will be impaired. We advise to have someone else do the driving if trouble finding directions or if minor accidents are reported. Independent driving assessment is available to determine safety of driving.   If you are interested in the driving assessment, you can contact the following:  [employer] in [place] [phone]  [facility] [phone]  [facility] [phone]  [facility] [phone] or [phone]   FALL PRECAUTIONS: Be cautious when walking. Scan the area for obstacles that may increase the risk of trips and falls. When getting up in the mornings, sit up at the edge of the bed for a few minutes before getting out of bed. Consider elevating the bed at the head end to avoid drop of blood pressure when getting up. Walk always in a well-lit room (use night lights in the walls). Avoid area rugs or power cords from appliances in the middle of the walkways. Use a walker or a cane if necessary and consider physical therapy for balance exercise. Get your eyesight checked regularly.  FINANCIAL OVERSIGHT: Supervision, especially oversight when making financial decisions or transactions is also recommended.  HOME SAFETY: Consider the safety of the kitchen when operating appliances like stoves, microwave oven, and blender. Consider  having supervision and share cooking responsibilities until no longer able to participate in those. Accidents with firearms and other hazards in the house should be identified and addressed as well.   ABILITY TO BE  LEFT ALONE: If patient is unable to contact 911 operator, consider using LifeLine, or when the need is there, arrange for someone to stay with patients. Smoking is a fire hazard, consider supervision or cessation. Risk of wandering should be assessed by caregiver and if detected at any point, supervision and safe proof recommendations should be instituted.  MEDICATION SUPERVISION: Inability to self-administer medication needs to be constantly addressed. Implement a mechanism to ensure safe administration of the medications.      Mediterranean Diet A Mediterranean diet refers to food and lifestyle choices that are based on the traditions of countries located on the [employer]. This way of eating has been shown to help prevent certain conditions and improve outcomes for people who have chronic diseases, like kidney disease and heart disease. What are tips for following this plan? Lifestyle  Cook and eat meals together with your family, when possible. Drink enough fluid to keep your urine clear or pale yellow. Be physically active every day. This includes: Aerobic exercise like running or swimming. Leisure activities like gardening, walking, or housework. Get 7-8 hours of sleep each night. If recommended by your health care provider, drink red wine in moderation. This means 1 glass a day for nonpregnant women and 2 glasses a day for men. A glass of wine equals 5 oz (150 mL). Reading food labels  Check the serving size of packaged foods. For foods such as rice and pasta, the serving size refers to the amount of cooked product, not dry. Check the total fat in packaged foods. Avoid foods that have saturated fat or trans fats. Check the ingredients list for added sugars, such as corn syrup. Shopping  At the grocery store, buy most of your food from the areas near the walls of the store. This includes: Fresh fruits and vegetables (produce). Grains, beans, nuts, and seeds. Some of these may  be available in unpackaged forms or large amounts (in bulk). Fresh seafood. Poultry and eggs. Low-fat dairy products. Buy whole ingredients instead of prepackaged foods. Buy fresh fruits and vegetables in-season from local farmers markets. Buy frozen fruits and vegetables in resealable bags. If you do not have access to quality fresh seafood, buy precooked frozen shrimp or canned fish, such as tuna, salmon, or sardines. Buy small amounts of raw or cooked vegetables, salads, or olives from the deli or salad bar at your store. Stock your pantry so you always have certain foods on hand, such as olive oil, canned tuna, canned tomatoes, rice, pasta, and beans. Cooking  Cook foods with extra-virgin olive oil instead of using butter or other vegetable oils. Have meat as a side dish, and have vegetables or grains as your main dish. This means having meat in small portions or adding small amounts of meat to foods like pasta or stew. Use beans or vegetables instead of meat in common dishes like chili or lasagna. Experiment with different cooking methods. Try roasting or broiling vegetables instead of steaming or sauteing them. Add frozen vegetables to soups, stews, pasta, or rice. Add nuts or seeds for added healthy fat at each meal. You can add these to yogurt, salads, or vegetable dishes. Marinate fish or vegetables using olive oil, lemon juice, garlic, and fresh herbs. Meal planning  Plan to eat 1 vegetarian meal  one day each week. Try to work up to 2 vegetarian meals, if possible. Eat seafood 2 or more times a week. Have healthy snacks readily available, such as: Vegetable sticks with hummus. Greek yogurt. Fruit and nut trail mix. Eat balanced meals throughout the week. This includes: Fruit: 2-3 servings a day Vegetables: 4-5 servings a day Low-fat dairy: 2 servings a day Fish, poultry, or lean meat: 1 serving a day Beans and legumes: 2 or more servings a week Nuts and seeds: 1-2 servings a  day Whole grains: 6-8 servings a day Extra-virgin olive oil: 3-4 servings a day Limit red meat and sweets to only a few servings a month What are my food choices? Mediterranean diet Recommended Grains: Whole-grain pasta. Brown rice. Bulgar wheat. Polenta. Couscous. Whole-wheat bread. Dwyane Glad. Vegetables: Artichokes. Beets. Broccoli. Cabbage. Carrots. Eggplant. Green beans. Chard. Kale. Spinach. Onions. Leeks. Peas. Squash. Tomatoes. Peppers. Radishes. Fruits: Apples. Apricots. Avocado. Berries. Bananas. Cherries. Dates. Figs. Grapes. Lemons. Melon. Oranges. Peaches. Plums. Pomegranate. Meats and other protein foods: Beans. Almonds. Sunflower seeds. Pine nuts. Peanuts. Cod. Salmon. Scallops. Shrimp. Tuna. Tilapia. Clams. Oysters. Eggs. Dairy: Low-fat milk. Cheese. Greek yogurt. Beverages: Water. Red wine. Herbal tea. Fats and oils: Extra virgin olive oil. Avocado oil. Grape seed oil. Sweets and desserts: [place] yogurt with honey. Baked apples. Poached pears. Trail mix. Seasoning and other foods: Basil. Cilantro. Coriander. Cumin. Mint. Parsley. Sage. Rosemary. Tarragon. Garlic. Oregano. Thyme. Pepper. Balsalmic vinegar. Tahini. Hummus. Tomato sauce. Olives. Mushrooms. Limit these Grains: Prepackaged pasta or rice dishes. Prepackaged cereal with added sugar. Vegetables: Deep fried potatoes (french fries). Fruits: Fruit canned in syrup. Meats and other protein foods: Beef. Pork. Lamb. Poultry with skin. Hot dogs. Helene Loader. Dairy: Ice cream. Sour cream. Whole milk. Beverages: Juice. Sugar-sweetened soft drinks. Beer. Liquor and spirits. Fats and oils: Butter. Canola oil. Vegetable oil. Beef fat (tallow). Lard. Sweets and desserts: Cookies. Cakes. Pies. Candy. Seasoning and other foods: Mayonnaise. Premade sauces and marinades. The items listed may not be a complete list. Talk with your dietitian about what dietary choices are right for you. Summary The Mediterranean diet includes both  food and lifestyle choices. Eat a variety of fresh fruits and vegetables, beans, nuts, seeds, and whole grains. Limit the amount of red meat and sweets that you eat. Talk with your health care provider about whether it is safe for you to drink red wine in moderation. This means 1 glass a day for nonpregnant women and 2 glasses a day for men. A glass of wine equals 5 oz (150 mL). This information is not intended to replace advice given to you by your health care provider. Make sure you discuss any questions you have with your health care provider. Document Released: 11/24/2015 Document Revised: 12/27/2015 Document Reviewed: 11/24/2015 Elsevier Interactive Patient Education  2017 [employer].

## 2023-09-17 NOTE — Progress Notes (Signed)
 Assessment/Plan:    Mild cognitive impairment   Sarah Moore is a very pleasant 73 y.o. RH female with a history of hypertension, hyperlipidemia, history of acute metabolic encephalopathy due to hypoglycemia and hyperbilirubinemia and medications in April and May 2024, history of  septic shock 07/2022, history of DVT November 2008  presenting today in follow-up for evaluation of memory loss. Patient is not on antidementia medication at this time due to GI issues that they needed to be addressed prior to it (colostomy reversal), which would be scheduled pending on the colonoscopy results.Patient is able to participate on ADLs and to drive without difficulties. Mood is good.      Recommendations:   Follow up in 6-7 months. Recommend good control of cardiovascular risk factors Continue to control mood as per PCP    Subjective:   This patient is here alone. Previous records as well as any outside records available were reviewed prior to todays visit.   Patient was last seen on 06/17/2023 with [facility] 19/30 .    Any changes in memory since last visit? " About the same, no changes".  Denies any worsening short-term memory or long-term memory. repeats oneself?  Endorsed Disoriented when walking into a room?  Patient denies    Misplacing objects?  Patient denies.   Wandering behavior?   Denies. Any personality changes since last visit? Denies.   Any worsening depression?: denies.   Hallucinations or paranoia?  Denies.   Seizures?   Denies.    Any sleep changes? Sleeps well unless she drinks Pepsi at which time she may be more awake at night. Denies vivid dreams, REM behavior or sleepwalking   Sleep apnea?   denies    Any hygiene concerns?   Denies.   Independent of bathing and dressing?  Endorsed  Does the patient needs help with medications? Patient is in charge   Who is in charge of the finances?  Patient is in charge     Any changes in appetite?  Denies. "I don't have a big appetite".       Patient have trouble swallowing?   Denies , she has a history of GERD, s/p dilatation Does the patient cook? Yes.  Any kitchen accidents such as leaving the stove on?   Denies.   Any headaches?    Denies.   Vision changes? Denies. Chronic pain?  Denies.   Ambulates with difficulty?    Denies.    Recent falls or head injuries?    Denies.      Unilateral weakness, numbness or tingling?  Denies.   Any tremors?  Denies.   Any anosmia?    Denies.   Any incontinence of urine?  Denies.   Any bowel dysfunction?  She has a history of intussusception 2024, status post colostomy on January 2025, for colonoscopy on June 17 reversal sometime end of June or July.       Patient lives alone, daughter monitors.  Does the patient drive?  Yes, short distances, denies getting lost, no longer drives long distances   Initial visit 06/17/2023   How long did patient have memory difficulties?  She denies any issues .  "Sometimes I get mixed up with the words ". In the hospital (back in 2024).  She "could not get the words out but I am fine now". Denies any issues remembering new information, recent conversations or names of people. repeats oneself?  Endorsed Disoriented when walking into a room?  Denies except occasionally not remembering  what patient came to the room for    Leaving objects in unusual places?   Denies.  Wandering behavior? Denies.   Any personality changes, or depression, anxiety?  Has moments of irritability.  Hallucinations or paranoia?  Denies.   Seizures? Denies.    Any sleep changes?  Sleeps well, Denies vivid dreams, REM behavior or sleepwalking.   Sleep apnea? Denies.   Any hygiene concerns?  Denies.   Independent of bathing and dressing?  Denies  who is in charge of the medications? Patient is in charge   Who is in charge of the finances? Patient  is in charge     Any changes in appetite?  "My appetite is real crazy but food doesn't taste as before" Patient have trouble  swallowing?  History of GERD, s/p dilatation Does the patient cook?  No   Any headaches?  Denies.   Chronic back pain?  Denies.   Ambulates with difficulty? Denies  Recent falls or head injuries? Denies.     Vision changes? Denies. Stroke like symptoms?  Denies.   Any tremors?  Denies.   Any anosmia?  Denies.   Any incontinence of urine? Denies.   Any bowel dysfunction?  She has a history of intussusception 2024, status post colostomy January 2025. Patient lives alone History of heavy alcohol intake? Denies.   History of heavy tobacco use? Denies.   Family history of dementia? Denies Does patient drive? 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  Past Medical History:  Diagnosis Date   Acid reflux    Arthritis    Chest pain    Fatigue    GERD (gastroesophageal reflux disease)    Hyperlipidemia      Past Surgical History:  Procedure Laterality Date   ABDOMINAL HYSTERECTOMY     COLONOSCOPY     KNEE ARTHROPLASTY Left    LAPAROTOMY N/A 08/07/2022   Procedure: EXPLORATORY LAPAROTOMY, sigmoid colectomy WITH COLOSTOMY;  Surgeon: Kinsinger, Alphonso Aschoff, MD;  Location: [facility];  Service: General;  Laterality: N/A;     PREVIOUS MEDICATIONS:   CURRENT MEDICATIONS:  Outpatient Encounter Medications as of 09/17/2023  Medication Sig   atorvastatin (LIPITOR) 80 MG tablet Take 80 mg by mouth daily.   cycloSPORINE (RESTASIS) 0.05 % ophthalmic emulsion Place 1 drop into both eyes as needed (for dry eyes).   dicyclomine (BENTYL) 20 MG tablet Take 20 mg by mouth every 6 (six) hours.   gabapentin (NEURONTIN) 300 MG capsule Take 300 mg by mouth 2 (two) times daily.   Glycerin -Polysorbate 80 (REFRESH DRY EYE THERAPY OP)  Apply 1 drop to eye daily as needed.   lactulose  (CHRONULAC ) 10 GM/15ML solution Place 30 mLs (20 g total) into feeding tube 3 (three) times daily.   lidocaine  (XYLOCAINE ) 5 % ointment Apply topically 3 (three) times daily as needed for mild pain or moderate pain.   Multiple Vitamin (MULTIVITAMIN WITH MINERALS) TABS tablet Take 2 tablets by mouth daily.   omeprazole (PRILOSEC) 40 MG capsule Take 40 mg by mouth daily.   traMADol  (ULTRAM ) 50 MG tablet Take 0.5 tablets (25 mg total) by mouth every 12 (twelve) hours as needed for severe pain.   [DISCONTINUED] diazepam  (VALIUM ) 2 MG tablet Take 1 tab 30 minutes prior to  MRI, may add an additional tab if needed   No facility-administered encounter medications on file as of 09/17/2023.     Objective:     PHYSICAL EXAMINATION:    VITALS:   Vitals:   09/17/23 1222  BP: 108/74  Pulse: 90  SpO2: 99%  Weight: 101 lb 12.8 oz (46.2 kg)  Height: 5\' 2"  (1.575 m)    GEN:  The patient appears stated age and is in NAD. HEENT:  Normocephalic, atraumatic.   Neurological examination:  General: NAD, well-groomed, appears stated age. Orientation: The patient is alert. Oriented to person, place and  to date.  Cranial nerves: There is good facial symmetry.The speech is fluent and clear. No aphasia or dysarthria. Fund of knowledge is appropriate. Recent memory impaired and remote memory is normal.  Attention and concentration are normal.  Able to name objects and repeat phrases.  Hearing is intact to conversational tone.    Sensation: Sensation is intact to light touch throughout Motor: Strength is at least antigravity x4. DTR's 2/4 in UE/LE      06/17/2023    6:00 PM  Montreal Cognitive Assessment   Visuospatial/ Executive (0/5) 3  Naming (0/3) 2  Attention: Read list of digits (0/2) 2  Attention: Read list of letters (0/1) 1  Attention: Serial 7 subtraction starting at 100 (0/3) 1  Language: Repeat phrase (0/2) 1  Language : Fluency (0/1) 1   Abstraction (0/2) 0  Delayed Recall (0/5) 2  Orientation (0/6) 6  Total 19  Adjusted Score (based on education) 19        No data to display             Movement examination: Tone: There is normal tone in the UE/LE Abnormal movements:  no tremor.  No myoclonus.  No asterixis.   Coordination:  There is no decremation with RAM's. Normal finger to nose  Gait and Station: The patient has no difficulty arising out of a deep-seated chair without the use of the hands. The patient's stride length is good.  Gait is cautious and narrow.   Thank you for allowing us  the opportunity to participate in the care of this nice patient. Please do not hesitate to contact us  for any questions or concerns.   Total time spent on today's visit was 22 minutes dedicated to this patient today, preparing to see patient, examining the patient, ordering tests and/or medications and counseling the patient, documenting clinical information in the EHR or other health record, independently interpreting results and communicating results to the patient/family, discussing treatment and goals, answering patient's questions and coordinating care.  Cc:  Dianah Fort, [place]  Tex Filbert 09/17/2023 1:05 PM

## 2023-10-01 DIAGNOSIS — Z860101 Personal history of adenomatous and serrated colon polyps: Secondary | ICD-10-CM | POA: Diagnosis not present

## 2023-10-01 DIAGNOSIS — Z1211 Encounter for screening for malignant neoplasm of colon: Secondary | ICD-10-CM | POA: Diagnosis not present

## 2023-10-14 DIAGNOSIS — L24B3 Irritant contact dermatitis related to fecal or urinary stoma or fistula: Secondary | ICD-10-CM | POA: Diagnosis not present

## 2023-10-14 DIAGNOSIS — Z933 Colostomy status: Secondary | ICD-10-CM | POA: Diagnosis not present

## 2023-10-29 DIAGNOSIS — K561 Intussusception: Secondary | ICD-10-CM | POA: Diagnosis not present

## 2023-10-29 DIAGNOSIS — Z433 Encounter for attention to colostomy: Secondary | ICD-10-CM | POA: Diagnosis not present

## 2023-10-29 NOTE — Progress Notes (Signed)
 Chief Complaint:   Chief Complaint  Patient presents with  . Follow-up    LTFU discuss reversal, pt has bowel prep already, sigmoid colectomy/colostomy    Subjective  Sarah Moore is a 73 y.o. female established patient in today for: History of Present Illness Sarah Moore is a 73 year old female who presents for a surgical consultation regarding her colostomy reversal.  She experiences significant anxiety about the upcoming colostomy reversal surgery and inquires about preoperative sedation to manage her nerves. She is concerned about the surgical process, specifically whether she will need to be cut again where her colostomy is located. She asks about the surgical technique, including the use of small incisions and the potential for a larger incision if necessary. She has already picked up all the necessary preoperative medications, including antibiotics.  There is no problem list on file for this patient.   Outpatient Medications Prior to Visit  Medication Sig Dispense Refill  . atorvastatin (LIPITOR) 20 MG tablet Take 20 mg by mouth once daily    . bisacodyL (DULCOLAX) 5 mg EC tablet Take 4 tablets (20 mg total) by mouth once daily as needed for Constipation for up to 1 dose 4 tablet 0  . lactulose  (ENULOSE ) 10 gram/15 mL oral solution 15 mL orally 3 TIMES A DAY    . lactulose  (ENULOSE ) 10 gram/15 mL oral solution Take 30 mLs by mouth once daily for 360 days 2700 mL 3  . omeprazole (PRILOSEC) 40 MG DR capsule 1 cap(s) orally once a day for 30 days     No facility-administered medications prior to visit.       Objective  Vitals:   10/29/23 1432 10/29/23 1433  BP: 98/66   Pulse: 98   Temp: 36.7 C (98 F)   SpO2: 98%   Weight: 45.5 kg (100 lb 6.4 oz)   Height: 154.9 cm (5' 1)   PainSc:  0-No pain   Body mass index is 18.97 kg/m. Physical Exam Constitutional:      Appearance: Normal appearance.  HENT:     Head: Normocephalic and atraumatic.  Pulmonary:      Effort: Pulmonary effort is normal.  Abdominal:     Comments: Well healed wound, right sided colostomy  Musculoskeletal:        General: Normal range of motion.     Cervical back: Normal range of motion.  Neurological:     General: No focal deficit present.     Mental Status: She is alert and oriented to person, place, and time. Mental status is at baseline.  Psychiatric:        Mood and Affect: Mood normal.        Behavior: Behavior normal.        Thought Content: Thought content normal.     Results DIAGNOSTIC Colonoscopy: Normal     I reviewed colonoscopy results by Dr. Belvie Just showing no tumors or polyps removed, though he was unable to examine the rectum due to stool balls. I reviewed notes by Camie Sevin showing a neuro work up with some mild cognitive delay but no focal deficits or reasons to think surgery is contraindicated.  Assessment/Plan:   Assessment & Plan Colostomy reversal Scheduled for colostomy reversal with small incisions. Risk of larger incision 10% or less. Procedure duration approximately three hours. Hospital stay expected three to five days. 1% risk of postoperative complications. Possible loop ileostomy if testing problematic. - Proceed with colostomy reversal surgery. - Plan for a hospital  stay of three to five days post-surgery.  Preoperative anxiety Expressed significant anxiety about surgery and requested medication. - Administer preoperative anxiolytic medication on the day of surgery. - Document preoperative anxiety in her chart. - Encourage communication with the preoperative team about anxiety. Diagnoses and all orders for this visit:  Colostomy care (CMS/HHS-HCC)  Intussusception of colon (CMS/HHS-HCC)      This note has been created using automated tools and reviewed for accuracy by [facility].

## 2023-11-04 DIAGNOSIS — Z720 Tobacco use: Secondary | ICD-10-CM | POA: Diagnosis not present

## 2023-11-04 DIAGNOSIS — E782 Mixed hyperlipidemia: Secondary | ICD-10-CM | POA: Diagnosis not present

## 2023-11-04 DIAGNOSIS — M1990 Unspecified osteoarthritis, unspecified site: Secondary | ICD-10-CM | POA: Diagnosis not present

## 2023-11-04 DIAGNOSIS — E559 Vitamin D deficiency, unspecified: Secondary | ICD-10-CM | POA: Diagnosis not present

## 2023-11-04 DIAGNOSIS — J302 Other seasonal allergic rhinitis: Secondary | ICD-10-CM | POA: Diagnosis not present

## 2023-11-04 DIAGNOSIS — K581 Irritable bowel syndrome with constipation: Secondary | ICD-10-CM | POA: Diagnosis not present

## 2023-11-04 DIAGNOSIS — K219 Gastro-esophageal reflux disease without esophagitis: Secondary | ICD-10-CM | POA: Diagnosis not present

## 2023-11-08 ENCOUNTER — Ambulatory Visit: Payer: Self-pay | Admitting: General Surgery

## 2023-11-08 DIAGNOSIS — Z433 Encounter for attention to colostomy: Secondary | ICD-10-CM

## 2023-11-13 DIAGNOSIS — L24B3 Irritant contact dermatitis related to fecal or urinary stoma or fistula: Secondary | ICD-10-CM | POA: Diagnosis not present

## 2023-11-13 DIAGNOSIS — Z933 Colostomy status: Secondary | ICD-10-CM | POA: Diagnosis not present

## 2023-11-13 NOTE — Progress Notes (Addendum)
 Anesthesia Review:  PCP: Joelyn- Bonsu  Cardiologist : none   PPM/ ICD: Device Orders: Rep Notified:  Chest x-ray : EKG : Echo : 08/11/22  Monitor-2023  Stress test: 2022  Cardiac Cath :   Activity level: can do a flight of stairs without difficulty  Sleep Study/ CPAP : none  Fasting Blood Sugar :      / Checks Blood Sugar -- times a day:    Blood Thinner/ Instructions /Last Dose: ASA / Instructions/ Last Dose :   LVMM on 11/19/23. At 1100am.  PT ararived at 1115am for preop appt.  PT called back after preop appt and attempted to call pt x 2 .  Both LVMM and call back of [phone].     Hgba1c-11/19/23-4.9    Proep nurse received message on 11/22/2023 in am to call pt .  PT had questions regarding preop instructons.  Called pt on 11/22/23 at 1125am.  LVMM and asked for call back.   11/25/23 Received message to call pt.  PT had a queston about a med.  Called pt and LVMM and left call back for [phone].  Pt called back at 1615pm and pt wanted to know should she take lactulose  the day of the bowel prep  Preop nurse instructed pt not to take lactulose  on day of bowel prep.  PT voiced understanding.

## 2023-11-14 NOTE — Patient Instructions (Signed)
 SURGICAL WAITING ROOM VISITATION  Patients having surgery or a procedure may have no more than 2 support people in the waiting area - these visitors may rotate.    Children under the age of 71 must have an adult with them who is not the patient.  Visitors with respiratory illnesses are discouraged from visiting and should remain at home.  If the patient needs to stay at the hospital during part of their recovery, the visitor guidelines for inpatient rooms apply. Pre-op nurse will coordinate an appropriate time for 1 support person to accompany patient in pre-op.  This support person may not rotate.    Please refer to the [facility] website for the visitor guidelines for Inpatients (after your surgery is over and you are in a regular room).       Your procedure is scheduled on:  12/02/2023    Report to [facility] Main Entrance    Report to admitting at AM   Call this number if you have problems the morning of surgery [phone]   Clear liquid diet on day of bowel prep.              Follow bowel prep instructons per MD.    After Midnight you may have the following liquids until __ 0930____ AM  DAY OF SURGERY  Water Non-Citrus Juices (without pulp, NO RED-Apple, White grape, White cranberry) Black Coffee (NO MILK/CREAM OR CREAMERS, sugar ok)  Clear Tea (NO MILK/CREAM OR CREAMERS, sugar ok) regular and decaf                             Plain Jell-O (NO RED)                                           Fruit ices (not with fruit pulp, NO RED)                                     Popsicles (NO RED)                                                               Sports drinks like Gatorade (NO RED)              Drink 2 Ensure/G2 drinks AT 10:00 PM the night before surgery.       The day of surgery:  Drink ONE (1) Pre-Surgery Clear Ensure or G2 at   0930AM the morning of surgery. Drink in one sitting. Do not sip.  This drink was given to you during your hospital  pre-op  appointment visit. Nothing else to drink after completing the  Pre-Surgery Clear Ensure or G2.          If you have questions, please contact your surgeon's office.   FOLLOW BOWEL PREP AND ANY ADDITIONAL PRE OP INSTRUCTIONS YOU RECEIVED FROM YOUR SURGEON'S OFFICE!!!     Oral Hygiene is also important to reduce your risk of infection.  Remember - BRUSH YOUR TEETH THE MORNING OF SURGERY WITH YOUR REGULAR TOOTHPASTE  DENTURES WILL BE REMOVED PRIOR TO SURGERY PLEASE DO NOT APPLY Poly grip OR ADHESIVES!!!   Do NOT smoke after Midnight   Stop all vitamins and herbal supplements 7 days before surgery.   Take these medicines the morning of surgery with A SIP OF WATER:  gabapentin, omeprazole   DO NOT TAKE ANY ORAL DIABETIC MEDICATIONS DAY OF YOUR SURGERY  Bring CPAP mask and tubing day of surgery.                              You may not have any metal on your body including hair pins, jewelry, and body piercing             Do not wear make-up, lotions, powders, perfumes/cologne, or deodorant  Do not wear nail polish including gel and S&S, artificial/acrylic nails, or any other type of covering on natural nails including finger and toenails. If you have artificial nails, gel coating, etc. that needs to be removed by a nail salon please have this removed prior to surgery or surgery may need to be canceled/ delayed if the surgeon/ anesthesia feels like they are unable to be safely monitored.   Do not shave  48 hours prior to surgery.               Men may shave face and neck.   Do not bring valuables to the hospital. [facility] IS NOT             RESPONSIBLE   FOR VALUABLES.   Contacts, glasses, dentures or bridgework may not be worn into surgery.   Bring small overnight bag day of surgery.   DO NOT BRING YOUR HOME MEDICATIONS TO THE HOSPITAL. PHARMACY WILL DISPENSE MEDICATIONS LISTED ON YOUR MEDICATION LIST TO YOU DURING YOUR ADMISSION IN THE  HOSPITAL!    Patients discharged on the day of surgery will not be allowed to drive home.  Someone NEEDS to stay with you for the first 24 hours after anesthesia.   Special Instructions: Bring a copy of your healthcare power of attorney and living will documents the day of surgery if you haven't scanned them before.              Please read over the following fact sheets you were given: IF YOU HAVE QUESTIONS ABOUT YOUR PRE-OP INSTRUCTIONS PLEASE CALL [phone].   If you received a COVID test during your pre-op visit  it is requested that you wear a mask when out in public, stay away from anyone that may not be feeling well and notify your surgeon if you develop symptoms. If you test positive for Covid or have been in contact with anyone that has tested positive in the last 10 days please notify you surgeon.    [facility] - Preparing for Surgery Before surgery, you can play an important role.  Because skin is not sterile, your skin needs to be as free of germs as possible.  You can reduce the number of germs on your skin by washing with CHG (chlorahexidine gluconate) soap before surgery.  CHG is an antiseptic cleaner which kills germs and bonds with the skin to continue killing germs even after washing. Please DO NOT use if you have an allergy to CHG or antibacterial soaps.  If your skin becomes reddened/irritated stop using the CHG and inform your nurse when  you arrive at Short Stay. Do not shave (including legs and underarms) for at least 48 hours prior to the first CHG shower.  You may shave your face/neck. Please follow these instructions carefully:  1.  Shower with CHG Soap the night before surgery and the  morning of Surgery.  2.  If you choose to wash your hair, wash your hair first as usual with your  normal  shampoo.  3.  After you shampoo, rinse your hair and body thoroughly to remove the  shampoo.                           4.  Use CHG as you would any other liquid soap.  You can apply chg  directly  to the skin and wash                       Gently with a scrungie or clean washcloth.  5.  Apply the CHG Soap to your body ONLY FROM THE NECK DOWN.   Do not use on face/ open                           Wound or open sores. Avoid contact with eyes, ears mouth and genitals (private parts).                       Wash face,  Genitals (private parts) with your normal soap.             6.  Wash thoroughly, paying special attention to the area where your surgery  will be performed.  7.  Thoroughly rinse your body with warm water from the neck down.  8.  DO NOT shower/wash with your normal soap after using and rinsing off  the CHG Soap.                9.  Pat yourself dry with a clean towel.            10.  Wear clean pajamas.            11.  Place clean sheets on your bed the night of your first shower and do not  sleep with pets. Day of Surgery : Do not apply any lotions/deodorants the morning of surgery.  Please wear clean clothes to the hospital/surgery center.  FAILURE TO FOLLOW THESE INSTRUCTIONS MAY RESULT IN THE CANCELLATION OF YOUR SURGERY PATIENT SIGNATURE_________________________________  NURSE SIGNATURE__________________________________  ________________________________________________________________________

## 2023-11-19 ENCOUNTER — Encounter (HOSPITAL_COMMUNITY)
Admission: RE | Admit: 2023-11-19 | Discharge: 2023-11-19 | Disposition: A | Discharge status: Home / Self Care | Source: Ambulatory Visit | Attending: General Surgery | Admitting: General Surgery

## 2023-11-19 ENCOUNTER — Other Ambulatory Visit: Payer: Self-pay

## 2023-11-19 ENCOUNTER — Encounter (HOSPITAL_COMMUNITY): Payer: Self-pay

## 2023-11-19 VITALS — BP 100/65 | HR 95 | Temp 98.5°F | Resp 16 | Ht 65.0 in | Wt 100.0 lb

## 2023-11-19 DIAGNOSIS — K561 Intussusception: Secondary | ICD-10-CM | POA: Diagnosis not present

## 2023-11-19 DIAGNOSIS — K5904 Chronic idiopathic constipation: Secondary | ICD-10-CM | POA: Insufficient documentation

## 2023-11-19 DIAGNOSIS — D649 Anemia, unspecified: Secondary | ICD-10-CM | POA: Insufficient documentation

## 2023-11-19 DIAGNOSIS — Z933 Colostomy status: Secondary | ICD-10-CM | POA: Diagnosis not present

## 2023-11-19 DIAGNOSIS — I714 Abdominal aortic aneurysm, without rupture, unspecified: Secondary | ICD-10-CM | POA: Diagnosis not present

## 2023-11-19 DIAGNOSIS — G3184 Mild cognitive impairment, so stated: Secondary | ICD-10-CM | POA: Diagnosis not present

## 2023-11-19 DIAGNOSIS — K219 Gastro-esophageal reflux disease without esophagitis: Secondary | ICD-10-CM | POA: Diagnosis not present

## 2023-11-19 DIAGNOSIS — Z87891 Personal history of nicotine dependence: Secondary | ICD-10-CM | POA: Insufficient documentation

## 2023-11-19 DIAGNOSIS — M1990 Unspecified osteoarthritis, unspecified site: Secondary | ICD-10-CM | POA: Diagnosis not present

## 2023-11-19 DIAGNOSIS — R002 Palpitations: Secondary | ICD-10-CM | POA: Insufficient documentation

## 2023-11-19 DIAGNOSIS — Z01812 Encounter for preprocedural laboratory examination: Secondary | ICD-10-CM | POA: Insufficient documentation

## 2023-11-19 DIAGNOSIS — Z433 Encounter for attention to colostomy: Secondary | ICD-10-CM

## 2023-11-19 DIAGNOSIS — Z01818 Encounter for other preprocedural examination: Secondary | ICD-10-CM

## 2023-11-19 LAB — CBC
HCT: 38.4 % (ref 36.0–46.0)
Hemoglobin: 11.9 g/dL — ABNORMAL LOW (ref 12.0–15.0)
MCH: 29.8 pg (ref 26.0–34.0)
MCHC: 31 g/dL (ref 30.0–36.0)
MCV: 96.2 fL (ref 80.0–100.0)
Platelets: 239 K/uL (ref 150–400)
RBC: 3.99 MIL/uL (ref 3.87–5.11)
RDW: 13.4 % (ref 11.5–15.5)
WBC: 5.9 K/uL (ref 4.0–10.5)
nRBC: 0 % (ref 0.0–0.2)

## 2023-11-19 LAB — BASIC METABOLIC PANEL WITH GFR
Anion gap: 8 (ref 5–15)
BUN: 11 mg/dL (ref 8–23)
CO2: 27 mmol/L (ref 22–32)
Calcium: 9.9 mg/dL (ref 8.9–10.3)
Chloride: 104 mmol/L (ref 98–111)
Creatinine, Ser: 0.45 mg/dL (ref 0.44–1.00)
GFR, Estimated: >60 mL/min (ref >60)
Glucose, Bld: 132 mg/dL — ABNORMAL HIGH (ref 70–99)
Potassium: 3.8 mmol/L (ref 3.5–5.1)
Sodium: 139 mmol/L (ref 135–145)

## 2023-11-19 LAB — TYPE AND SCREEN
ABO/RH(D): B POS
Antibody Screen: NEGATIVE

## 2023-11-20 LAB — HEMOGLOBIN A1C
Hgb A1c MFr Bld: 4.9 % (ref 4.8–5.6)
Mean Plasma Glucose: 94 mg/dL

## 2023-11-20 NOTE — Anesthesia Preprocedure Evaluation (Addendum)
 Anesthesia Evaluation  Patient identified by MRN, date of birth, ID band Patient awake    Reviewed: Allergy & Precautions, H&P , NPO status , Patient's Chart, lab work & pertinent test results  Airway Mallampati: II  TM Distance: >3 FB Neck ROM: Full    Dental no notable dental hx. (+) Teeth Intact, Dental Advisory Given   Pulmonary former smoker   Pulmonary exam normal breath sounds clear to auscultation       Cardiovascular negative cardio ROS  Rhythm:Regular Rate:Normal     Neuro/Psych negative neurological ROS  negative psych ROS   GI/Hepatic Neg liver ROS,GERD  ,,  Endo/Other  negative endocrine ROS    Renal/GU negative Renal ROS  negative genitourinary   Musculoskeletal  (+) Arthritis , Osteoarthritis,    Abdominal   Peds  Hematology  (+) Blood dyscrasia, anemia   Anesthesia Other Findings   Reproductive/Obstetrics negative OB ROS                              Anesthesia Physical Anesthesia Plan  ASA: 2  Anesthesia Plan: General   Post-op Pain Management: Tylenol  PO (pre-op)*   Induction: Intravenous  PONV Risk Score and Plan: 4 or greater and Ondansetron , Dexamethasone  and Midazolam   Airway Management Planned: Oral ETT  Additional Equipment:   Intra-op Plan:   Post-operative Plan: Extubation in OR  Informed Consent: I have reviewed the patients History and Physical, chart, labs and discussed the procedure including the risks, benefits and alternatives for the proposed anesthesia with the patient or authorized representative who has indicated his/her understanding and acceptance.     Dental advisory given  Plan Discussed with: CRNA  Anesthesia Plan Comments: (See PAT note from 8/5)         Anesthesia Quick Evaluation

## 2023-11-20 NOTE — Progress Notes (Signed)
 Case: [id number] Date/Time: 12/02/23 1215   Procedure: CLOSURE, COLOSTOMY, ROBOT-ASSISTED - ROBOTIC COLOSTOMY REVERSAL WITH COLORECTAL ANASTOMSIS   Anesthesia type: General   Diagnosis:      Intussusception of colon (HCC) [K56.1]     Colostomy care (HCC) [Z43.3]     Chronic idiopathic constipation [K59.04]   Pre-op diagnosis: COLOSTOMY CARE   Location: [facility] ROOM 02 / [facility]   Surgeons: Kinsinger, Herlene Righter, MD       DISCUSSION: Sarah Moore is a 73 yo female with PMH of former smoking, palpitations, suprarenal AAA (3.4 cm), mild cognitive impairment, colonic intussusception s/p ex-lap with colostomy creation (07/2022), GERD, esophageal stenosis s/p dilation, arthritis, anemia.  Prior anesthesia complications include post op delirium and acute hepatic/metabolic encephalopathy after ex-lap in April 2025 causing prolonged hospitalization. Possibly related to septic shock vs anesthesia or combination of both.  Hx of mild cognitive impairment, followed by Neurology.  Seen by PCP on 11/04/23. All issues stable at that visit.  Seen by Cardiology in the past for palpitations and hx of syncope. Echo in 2024 showed normal LVEF with grade I DD, moderate pHTN, no significant valvular disease. Stress test in 2022 was low risk. Event monitoring the past did not show any arrhythmia.   VS: BP 100/65   Pulse 95   Temp 36.9 C (Oral)   Resp 16   Ht 5' 5 (1.651 m)   Wt 45.4 kg   SpO2 100%   BMI 16.64 kg/m   PROVIDERS: Rosalea Rosina SAILOR, PA   LABS: Labs reviewed: Acceptable for surgery. (all labs ordered are listed, but only abnormal results are displayed)  Labs Reviewed  CBC - Abnormal; Notable for the following components:      Result Value   Hemoglobin 11.9 (*)    All other components within normal limits  BASIC METABOLIC PANEL WITH GFR - Abnormal; Notable for the following components:   Glucose, Bld 132 (*)    All other components within normal limits  HEMOGLOBIN A1C  TYPE AND SCREEN      IMAGES:   EKG:   CV: Echo 08/11/22:  IMPRESSIONS    1. Left ventricular ejection fraction, by estimation, is 60 to 65%. The left ventricle has normal function. The left ventricle has no regional wall motion abnormalities. Left ventricular diastolic parameters are consistent with Grade I diastolic dysfunction (impaired relaxation).  2. Right ventricular systolic function is normal. The right ventricular size is normal. There is moderately elevated pulmonary artery systolic pressure. The estimated right ventricular systolic pressure is 50.3 mmHg.  3. The mitral valve is normal in structure. No evidence of mitral valve regurgitation. No evidence of mitral stenosis.  4. The aortic valve is normal in structure. Aortic valve regurgitation is not visualized. No aortic stenosis is present.  5. The inferior vena cava is dilated in size with <50% respiratory variability, suggesting right atrial pressure of 15 mmHg.  Stress test 06/06/2020:  The left ventricular ejection fraction is hyperdynamic (>65%). Nuclear stress EF: 75%. Blood pressure demonstrated a hypertensive response to exercise. There was no ST segment deviation noted during stress. The study is normal. This is a low risk study.   Negative study for ischemia or infarction.    Past Medical History:  Diagnosis Date   Acid reflux    Arthritis    Chest pain    Fatigue    GERD (gastroesophageal reflux disease)    Hyperlipidemia     Past Surgical History:  Procedure Laterality Date   ABDOMINAL  HYSTERECTOMY     COLONOSCOPY     KNEE ARTHROPLASTY Left    LAPAROTOMY N/A 08/07/2022   Procedure: EXPLORATORY LAPAROTOMY, sigmoid colectomy WITH COLOSTOMY;  Surgeon: Stevie, Herlene Righter, MD;  Location: [facility];  Service: General;  Laterality: N/A;    MEDICATIONS:  atorvastatin (LIPITOR) 80 MG tablet   Glycerin -Polysorbate 80 (REFRESH DRY EYE THERAPY OP)   lactulose  (CHRONULAC ) 10 GM/15ML solution   lidocaine   (XYLOCAINE ) 5 % ointment   Polyethyl Glycol-Propyl Glycol (SYSTANE) 0.4-0.3 % GEL ophthalmic gel   traMADol  (ULTRAM ) 50 MG tablet   No current facility-administered medications for this encounter.   Burnard [name] Short Stay/Anesthesiology [facility] Phone [phone] 11/20/2023 2:34 PM

## 2023-11-28 NOTE — Telephone Encounter (Signed)
 SABRA

## 2023-12-02 ENCOUNTER — Inpatient Hospital Stay (HOSPITAL_COMMUNITY): Admitting: Certified Registered"

## 2023-12-02 ENCOUNTER — Other Ambulatory Visit: Payer: Self-pay

## 2023-12-02 ENCOUNTER — Inpatient Hospital Stay (HOSPITAL_COMMUNITY): Payer: Self-pay | Admitting: Medical

## 2023-12-02 ENCOUNTER — Encounter (HOSPITAL_COMMUNITY)
Admission: RE | Disposition: A | Discharge status: Home / Self Care | Payer: Self-pay | Source: Ambulatory Visit | Attending: General Surgery

## 2023-12-02 ENCOUNTER — Inpatient Hospital Stay (HOSPITAL_COMMUNITY)
Admission: RE | Admit: 2023-12-02 | Discharge: 2023-12-05 | DRG: 331 | Disposition: A | Discharge status: Home / Self Care | Source: Ambulatory Visit | Attending: General Surgery | Admitting: General Surgery

## 2023-12-02 ENCOUNTER — Encounter (HOSPITAL_COMMUNITY): Payer: Self-pay | Admitting: General Surgery

## 2023-12-02 DIAGNOSIS — F419 Anxiety disorder, unspecified: Secondary | ICD-10-CM | POA: Diagnosis present

## 2023-12-02 DIAGNOSIS — Z9049 Acquired absence of other specified parts of digestive tract: Secondary | ICD-10-CM

## 2023-12-02 DIAGNOSIS — E785 Hyperlipidemia, unspecified: Secondary | ICD-10-CM | POA: Diagnosis present

## 2023-12-02 DIAGNOSIS — Z885 Allergy status to narcotic agent status: Secondary | ICD-10-CM

## 2023-12-02 DIAGNOSIS — Z9889 Other specified postprocedural states: Secondary | ICD-10-CM | POA: Diagnosis present

## 2023-12-02 DIAGNOSIS — Z87891 Personal history of nicotine dependence: Secondary | ICD-10-CM

## 2023-12-02 DIAGNOSIS — Z433 Encounter for attention to colostomy: Principal | ICD-10-CM

## 2023-12-02 DIAGNOSIS — K660 Peritoneal adhesions (postprocedural) (postinfection): Secondary | ICD-10-CM | POA: Diagnosis present

## 2023-12-02 DIAGNOSIS — Z01818 Encounter for other preprocedural examination: Secondary | ICD-10-CM

## 2023-12-02 DIAGNOSIS — Z79899 Other long term (current) drug therapy: Secondary | ICD-10-CM

## 2023-12-02 HISTORY — PX: XI ROBOTIC ASSISTED COLOSTOMY TAKEDOWN: SHX6828

## 2023-12-02 LAB — CBC
HCT: 36.8 % (ref 36.0–46.0)
Hemoglobin: 11 g/dL — ABNORMAL LOW (ref 12.0–15.0)
MCH: 29.4 pg (ref 26.0–34.0)
MCHC: 29.9 g/dL — ABNORMAL LOW (ref 30.0–36.0)
MCV: 98.4 fL (ref 80.0–100.0)
Platelets: 179 K/uL (ref 150–400)
RBC: 3.74 MIL/uL — ABNORMAL LOW (ref 3.87–5.11)
RDW: 13.5 % (ref 11.5–15.5)
WBC: 10 K/uL (ref 4.0–10.5)
nRBC: 0 % (ref 0.0–0.2)

## 2023-12-02 LAB — CREATININE, SERUM
Creatinine, Ser: 0.59 mg/dL (ref 0.44–1.00)
GFR, Estimated: >60 mL/min (ref >60)

## 2023-12-02 SURGERY — CLOSURE, COLOSTOMY, ROBOT-ASSISTED
Anesthesia: General | Site: Abdomen

## 2023-12-02 MED ORDER — PROPOFOL 10 MG/ML IV BOLUS
INTRAVENOUS | Status: DC | PRN
  Administered 2023-12-02: 100 mg via INTRAVENOUS

## 2023-12-02 MED ORDER — SODIUM CHLORIDE 0.45 % IV SOLN: INTRAVENOUS | Status: DC

## 2023-12-02 MED ORDER — PROPOFOL 10 MG/ML IV BOLUS
INTRAVENOUS | Status: AC
  Filled 2023-12-02: qty 20

## 2023-12-02 MED ORDER — FENTANYL CITRATE PF 50 MCG/ML IJ SOSY: 25.0000 ug | PREFILLED_SYRINGE | INTRAMUSCULAR | Status: DC | PRN

## 2023-12-02 MED ORDER — EPHEDRINE SULFATE-NACL 50-0.9 MG/10ML-% IV SOSY
PREFILLED_SYRINGE | INTRAVENOUS | Status: DC | PRN
  Administered 2023-12-02 (×2): 5 mg via INTRAVENOUS

## 2023-12-02 MED ORDER — METOPROLOL TARTRATE 5 MG/5ML IV SOLN: 5.0000 mg | Freq: Four times a day (QID) | INTRAVENOUS | Status: DC | PRN

## 2023-12-02 MED ORDER — ENOXAPARIN SODIUM 30 MG/0.3ML IJ SOSY
30.0000 mg | PREFILLED_SYRINGE | INTRAMUSCULAR | Status: DC
  Administered 2023-12-03 – 2023-12-05 (×3): 30 mg via SUBCUTANEOUS
  Filled 2023-12-02 (×3): qty 0.3

## 2023-12-02 MED ORDER — CHLORHEXIDINE GLUCONATE 4 % EX SOLN: 1.0000 | Freq: Once | CUTANEOUS | Status: DC

## 2023-12-02 MED ORDER — FENTANYL CITRATE (PF) 100 MCG/2ML IJ SOLN
INTRAMUSCULAR | Status: AC
  Filled 2023-12-02: qty 2

## 2023-12-02 MED ORDER — ONDANSETRON HCL 4 MG/2ML IJ SOLN
INTRAMUSCULAR | Status: DC | PRN
  Administered 2023-12-02: 4 mg via INTRAVENOUS

## 2023-12-02 MED ORDER — HEPARIN SODIUM (PORCINE) 5000 UNIT/ML IJ SOLN
5000.0000 [IU] | Freq: Once | INTRAMUSCULAR | Status: AC
  Administered 2023-12-02: 5000 [IU] via SUBCUTANEOUS
  Filled 2023-12-02: qty 1

## 2023-12-02 MED ORDER — MIDAZOLAM HCL 2 MG/2ML IJ SOLN
INTRAMUSCULAR | Status: AC
  Filled 2023-12-02: qty 2

## 2023-12-02 MED ORDER — BUPIVACAINE-EPINEPHRINE (PF) 0.25% -1:200000 IJ SOLN
INTRAMUSCULAR | Status: AC
  Filled 2023-12-02: qty 60

## 2023-12-02 MED ORDER — LACTATED RINGERS IV SOLN: INTRAVENOUS | Status: DC

## 2023-12-02 MED ORDER — DEXAMETHASONE SODIUM PHOSPHATE 10 MG/ML IJ SOLN
INTRAMUSCULAR | Status: DC | PRN
Start: 2023-12-02 — End: 2023-12-02
  Administered 2023-12-02: 10 mg via INTRAVENOUS

## 2023-12-02 MED ORDER — GABAPENTIN 300 MG PO CAPS
300.0000 mg | ORAL_CAPSULE | ORAL | Status: AC
  Administered 2023-12-02: 300 mg via ORAL
  Filled 2023-12-02: qty 1

## 2023-12-02 MED ORDER — ONDANSETRON HCL 4 MG/2ML IJ SOLN
INTRAMUSCULAR | Status: AC
  Filled 2023-12-02: qty 2

## 2023-12-02 MED ORDER — SUGAMMADEX SODIUM 200 MG/2ML IV SOLN
INTRAVENOUS | Status: AC
  Filled 2023-12-02: qty 2

## 2023-12-02 MED ORDER — LIDOCAINE HCL 2 % IJ SOLN
INTRAMUSCULAR | Status: AC
  Filled 2023-12-02: qty 20

## 2023-12-02 MED ORDER — SUGAMMADEX SODIUM 200 MG/2ML IV SOLN
INTRAVENOUS | Status: DC | PRN
  Administered 2023-12-02 (×2): 100 mg via INTRAVENOUS

## 2023-12-02 MED ORDER — BUPIVACAINE LIPOSOME 1.3 % IJ SUSP: 20.0000 mL | Freq: Once | INTRAMUSCULAR | Status: DC

## 2023-12-02 MED ORDER — ROCURONIUM BROMIDE 10 MG/ML (PF) SYRINGE
PREFILLED_SYRINGE | INTRAVENOUS | Status: AC
  Filled 2023-12-02: qty 10

## 2023-12-02 MED ORDER — EPHEDRINE 5 MG/ML INJ
INTRAVENOUS | Status: AC
  Filled 2023-12-02: qty 5

## 2023-12-02 MED ORDER — ROCURONIUM BROMIDE 10 MG/ML (PF) SYRINGE
PREFILLED_SYRINGE | INTRAVENOUS | Status: DC | PRN
  Administered 2023-12-02: 50 mg via INTRAVENOUS

## 2023-12-02 MED ORDER — CELECOXIB 200 MG PO CAPS
200.0000 mg | ORAL_CAPSULE | Freq: Two times a day (BID) | ORAL | Status: DC
  Administered 2023-12-02 – 2023-12-05 (×6): 200 mg via ORAL
  Filled 2023-12-02 (×6): qty 1

## 2023-12-02 MED ORDER — 0.9 % SODIUM CHLORIDE (POUR BTL) OPTIME
TOPICAL | Status: DC | PRN
  Administered 2023-12-02: 2000 mL

## 2023-12-02 MED ORDER — KETAMINE HCL 50 MG/5ML IJ SOSY
PREFILLED_SYRINGE | INTRAMUSCULAR | Status: DC | PRN
  Administered 2023-12-02 (×3): 10 mg via INTRAVENOUS

## 2023-12-02 MED ORDER — DEXAMETHASONE SODIUM PHOSPHATE 10 MG/ML IJ SOLN
INTRAMUSCULAR | Status: AC
  Filled 2023-12-02: qty 1

## 2023-12-02 MED ORDER — ENSURE SURGERY PO LIQD
237.0000 mL | Freq: Two times a day (BID) | ORAL | Status: DC
  Administered 2023-12-03 (×2): 237 mL via ORAL

## 2023-12-02 MED ORDER — ACETAMINOPHEN 500 MG PO TABS
1000.0000 mg | ORAL_TABLET | ORAL | Status: AC
  Administered 2023-12-02: 1000 mg via ORAL
  Filled 2023-12-02: qty 2

## 2023-12-02 MED ORDER — ORAL CARE MOUTH RINSE: 15.0000 mL | Freq: Once | OROMUCOSAL | Status: AC

## 2023-12-02 MED ORDER — SODIUM CHLORIDE 0.9 % IV SOLN
2.0000 g | INTRAVENOUS | Status: AC
  Administered 2023-12-02: 2 g via INTRAVENOUS
  Filled 2023-12-02: qty 2

## 2023-12-02 MED ORDER — ONDANSETRON HCL 4 MG PO TABS: 4.0000 mg | ORAL_TABLET | Freq: Four times a day (QID) | ORAL | Status: DC | PRN

## 2023-12-02 MED ORDER — FENTANYL CITRATE (PF) 100 MCG/2ML IJ SOLN
INTRAMUSCULAR | Status: DC | PRN
  Administered 2023-12-02 (×2): 50 ug via INTRAVENOUS

## 2023-12-02 MED ORDER — MORPHINE SULFATE (PF) 2 MG/ML IV SOLN
2.0000 mg | INTRAVENOUS | Status: DC | PRN
  Administered 2023-12-02 – 2023-12-03 (×3): 2 mg via INTRAVENOUS
  Filled 2023-12-02 (×3): qty 1

## 2023-12-02 MED ORDER — ALVIMOPAN 12 MG PO CAPS
12.0000 mg | ORAL_CAPSULE | ORAL | Status: AC
  Administered 2023-12-02: 12 mg via ORAL
  Filled 2023-12-02: qty 1

## 2023-12-02 MED ORDER — OXYCODONE HCL 5 MG PO TABS
5.0000 mg | ORAL_TABLET | Freq: Four times a day (QID) | ORAL | Status: DC | PRN
  Administered 2023-12-03: 5 mg via ORAL
  Filled 2023-12-02: qty 1

## 2023-12-02 MED ORDER — KETAMINE HCL 50 MG/5ML IJ SOSY
PREFILLED_SYRINGE | INTRAMUSCULAR | Status: AC
  Filled 2023-12-02: qty 5

## 2023-12-02 MED ORDER — LIDOCAINE 20MG/ML (2%) 15 ML SYRINGE OPTIME
INTRAMUSCULAR | Status: DC | PRN
  Administered 2023-12-02: 1.5 mg/kg/h via INTRAVENOUS

## 2023-12-02 MED ORDER — ONDANSETRON HCL 4 MG/2ML IJ SOLN: 4.0000 mg | Freq: Four times a day (QID) | INTRAMUSCULAR | Status: DC | PRN

## 2023-12-02 MED ORDER — ALVIMOPAN 12 MG PO CAPS
12.0000 mg | ORAL_CAPSULE | Freq: Two times a day (BID) | ORAL | Status: DC
  Administered 2023-12-03 – 2023-12-04 (×2): 12 mg via ORAL
  Filled 2023-12-02 (×3): qty 1

## 2023-12-02 MED ORDER — GABAPENTIN 100 MG PO CAPS
300.0000 mg | ORAL_CAPSULE | Freq: Two times a day (BID) | ORAL | Status: DC
  Administered 2023-12-02 – 2023-12-05 (×6): 300 mg via ORAL
  Filled 2023-12-02 (×6): qty 3

## 2023-12-02 MED ORDER — BUPIVACAINE-EPINEPHRINE (PF) 0.25% -1:200000 IJ SOLN
INTRAMUSCULAR | Status: DC | PRN
  Administered 2023-12-02: 30 mL via PERINEURAL

## 2023-12-02 MED ORDER — SIMETHICONE 80 MG PO CHEW
40.0000 mg | CHEWABLE_TABLET | Freq: Four times a day (QID) | ORAL | Status: DC | PRN
  Administered 2023-12-03 – 2023-12-04 (×2): 40 mg via ORAL
  Filled 2023-12-02 (×2): qty 1

## 2023-12-02 MED ORDER — NEOMYCIN SULFATE 500 MG PO TABS: 1000.0000 mg | ORAL_TABLET | ORAL | Status: DC

## 2023-12-02 MED ORDER — CHLORHEXIDINE GLUCONATE 0.12 % MT SOLN
15.0000 mL | Freq: Once | OROMUCOSAL | Status: AC
  Administered 2023-12-02: 15 mL via OROMUCOSAL

## 2023-12-02 MED ORDER — ENSURE PRE-SURGERY PO LIQD: 296.0000 mL | Freq: Once | ORAL | Status: DC

## 2023-12-02 MED ORDER — METRONIDAZOLE 500 MG PO TABS: 1000.0000 mg | ORAL_TABLET | ORAL | Status: DC

## 2023-12-02 MED ORDER — ENSURE PRE-SURGERY PO LIQD: 592.0000 mL | Freq: Once | ORAL | Status: DC

## 2023-12-02 MED ORDER — MIDAZOLAM HCL 5 MG/5ML IJ SOLN
INTRAMUSCULAR | Status: DC | PRN
  Administered 2023-12-02 (×2): 1 mg via INTRAVENOUS

## 2023-12-02 SURGICAL SUPPLY — 68 items
BAG COUNTER SPONGE SURGICOUNT (BAG) ×1 IMPLANT
BLADE EXTENDED COATED 6.5IN (ELECTRODE) IMPLANT
CELLS DAT CNTRL 66122 CELL SVR (MISCELLANEOUS) IMPLANT
CHLORAPREP W/TINT 26 (MISCELLANEOUS) IMPLANT
CLIP APPLIE 5 13 M/L LIGAMAX5 (MISCELLANEOUS) IMPLANT
CLIP APPLIE ROT 10 11.4 M/L (STAPLE) IMPLANT
COVER SURGICAL LIGHT HANDLE (MISCELLANEOUS) ×2 IMPLANT
COVER TIP SHEARS 8 DVNC (MISCELLANEOUS) IMPLANT
DEVICE TROCAR PUNCTURE CLOSURE (ENDOMECHANICALS) IMPLANT
DRAIN CHANNEL 19F RND (DRAIN) IMPLANT
DRAPE ARM DVNC X/XI (DISPOSABLE) ×4 IMPLANT
DRAPE COLUMN DVNC XI (DISPOSABLE) ×1 IMPLANT
DRAPE SURG IRRIG POUCH 19X23 (DRAPES) ×1 IMPLANT
DRIVER NDL LRG 8 DVNC XI (INSTRUMENTS) ×1 IMPLANT
DRIVER NDLE LRG 8 DVNC XI (INSTRUMENTS) IMPLANT
DRSG IV TEGADERM 3.5X4.5 STRL (GAUZE/BANDAGES/DRESSINGS) IMPLANT
DRSG OPSITE POSTOP 4X10 (GAUZE/BANDAGES/DRESSINGS) IMPLANT
DRSG OPSITE POSTOP 4X6 (GAUZE/BANDAGES/DRESSINGS) IMPLANT
DRSG OPSITE POSTOP 4X8 (GAUZE/BANDAGES/DRESSINGS) IMPLANT
ELECT PENCIL ROCKER SW 15FT (MISCELLANEOUS) ×1 IMPLANT
ELECT REM PT RETURN 15FT ADLT (MISCELLANEOUS) ×1 IMPLANT
ENDOLOOP SUT PDS II 0 18 (SUTURE) IMPLANT
EVACUATOR SILICONE 100CC (DRAIN) IMPLANT
GAUZE SPONGE 2X2 8PLY STRL LF (GAUZE/BANDAGES/DRESSINGS) ×1 IMPLANT
GLOVE BIOGEL PI IND STRL 7.0 (GLOVE) ×3 IMPLANT
GLOVE SURG SS PI 7.0 STRL IVOR (GLOVE) ×3 IMPLANT
GOWN STRL REUS W/ TWL LRG LVL3 (GOWN DISPOSABLE) ×3 IMPLANT
GRASPER SUT TROCAR 14GX15 (MISCELLANEOUS) IMPLANT
GRASPER TIP-UP FEN DVNC XI (INSTRUMENTS) ×1 IMPLANT
HOLDER FOLEY CATH W/STRAP (MISCELLANEOUS) ×1 IMPLANT
IRRIGATION SUCT STRKRFLW 2 WTP (MISCELLANEOUS) ×1 IMPLANT
KIT PROCEDURE DVNC SI (MISCELLANEOUS) IMPLANT
KIT SIGMOIDOSCOPE (SET/KITS/TRAYS/PACK) IMPLANT
KIT TURNOVER KIT A (KITS) ×1 IMPLANT
PACK CARDIOVASCULAR III (CUSTOM PROCEDURE TRAY) ×1 IMPLANT
PACK COLON (CUSTOM PROCEDURE TRAY) ×1 IMPLANT
PAD POSITIONING PINK XL (MISCELLANEOUS) ×1 IMPLANT
PROTECTOR NERVE ULNAR (MISCELLANEOUS) ×2 IMPLANT
RELOAD STAPLE 45 3.5 BLU DVNC (STAPLE) IMPLANT
RELOAD STAPLE 45 4.3 GRN DVNC (STAPLE) IMPLANT
RELOAD STAPLE 60 3.5 BLU DVNC (STAPLE) IMPLANT
RELOAD STAPLE 60 4.3 GRN DVNC (STAPLE) IMPLANT
RETRACTOR WND ALEXIS 18 MED (MISCELLANEOUS) IMPLANT
SCISSORS LAP 5X35 DISP (ENDOMECHANICALS) ×1 IMPLANT
SCISSORS MNPLR CVD DVNC XI (INSTRUMENTS) ×1 IMPLANT
SEAL UNIV 5-12 XI (MISCELLANEOUS) ×4 IMPLANT
SEALER VESSEL EXT DVNC XI (MISCELLANEOUS) ×1 IMPLANT
SOLUTION ELECTROSURG ANTI STCK (MISCELLANEOUS) ×1 IMPLANT
SPIKE FLUID TRANSFER (MISCELLANEOUS) ×1 IMPLANT
STAPLER 60 SUREFORM DVNC (STAPLE) IMPLANT
STAPLER ECHELON POWER CIR 29 (STAPLE) IMPLANT
STAPLER ECHELON POWER CIR 31 (STAPLE) IMPLANT
SURGILUBE 2OZ TUBE FLIPTOP (MISCELLANEOUS) IMPLANT
SUT MNCRL AB 4-0 PS2 18 (SUTURE) ×1 IMPLANT
SUT PDS AB 0 CT1 36 (SUTURE) ×2 IMPLANT
SUT PROLENE 0 CT 2 (SUTURE) IMPLANT
SUT PROLENE 2 0 KS (SUTURE) IMPLANT
SUT SILK 2 0 SH CR/8 (SUTURE) IMPLANT
SUT SILK 3 0 SH CR/8 (SUTURE) ×1 IMPLANT
SUT VIC AB 3-0 SH 18 (SUTURE) IMPLANT
SUT VICRYL 0 UR6 27IN ABS (SUTURE) ×1 IMPLANT
SUTURE V-LC BRB 180 2/0GR6GS22 (SUTURE) IMPLANT
SYSTEM LAPSCP GELPORT 120MM (MISCELLANEOUS) IMPLANT
SYSTEM WOUND ALEXIS 18CM MED (MISCELLANEOUS) ×1 IMPLANT
TRAY FOLEY MTR SLVR 16FR STAT (SET/KITS/TRAYS/PACK) ×1 IMPLANT
TROCAR ADV FIXATION 5X100MM (TROCAR) ×1 IMPLANT
TUBING CONNECTING 10 (TUBING) ×2 IMPLANT
TUBING INSUFFLATION 10FT LAP (TUBING) ×1 IMPLANT

## 2023-12-02 NOTE — Plan of Care (Signed)
  Problem: Bowel/Gastric: Goal: Gastrointestinal status for postoperative course will improve Outcome: Progressing   

## 2023-12-02 NOTE — Op Note (Signed)
 Preoperative diagnosis: colostomy care  Postoperative diagnosis: same   Procedure: robotic assisted colostomy reversal with colorectal anastomosis  Surgeon: Herlene Bureau, M.D.  Asst: Mitzie Freund, M.D.  Anesthesia: GEta  Indications for procedure: Sarah Moore is a 73 y.o. year old female with history of intussusception and underwent colon resection in 07/2022.  Description of procedure: The patient was brought into the operative suite. Anesthesia was administered with General endotracheal anesthesia. WHO checklist was applied. The patient was then placed in lithotomy position. The area was prepped and draped in the usual sterile fashion.  Next, a small incision was made in the right subcostal area. A 5mm trocar was used to gain access to the peritoneal cavity by optical entry technique. Pneumoperitoneum was applied with a high flow and low pressure. The laparoscope was reinserted to confirm position.  Upon initial visualization, there were a lot of adhesions of the omentum to the abdominal wall.  There was enough room to place a 5 mm trocar in the right lateral space and then an 8 mm trocar in the right mid abdomen.  Blunt dissection was used to remove the omentum from the right side of the abdomen.  1 additional 8 mm trocar was placed in the right lower abdomen.  One 8 mm trocar was placed in the left upper abdomen.  The patient was placed in Trendelenburg.  Right sided T AP was placed with Marcaine .  The robot was docked.  Adhesiolysis was performed for 30 minutes with sharp and cautery dissection.  The colostomy was identified going up to the abdominal wall.  Rectal stump was identified and separated from additional adhesions.  Next, dilator was placed up the rectum to ensure reach and no narrowing.  Next, a circular incision was made around the colostomy and skin.  Cautery was used dissect down through subcutaneous tissues.  Colostomy was separated from subcutaneous tissues and  fascia.  Pursestring was made around the end of the colon with a 3-0 silk.  Dilator was used to confirm 29 mm EEA would be appropriate size.  A 29 mm EEA anvil was placed in pursestring secured around it.  And was placed back into the abdomen.  Wound protector was placed.  Pneumoperitoneum was reapplied.  Additional dissection of the white line of Toldt was used to fully mobilize the left colon.  End-to-end anastomosis was made with a 29 mm EEA stapler.  Next, Dr. Cameron performed flexible sigmoidoscopy showing no leak and anastomosis at 20 cm from the anal verge.  Colorectal cleaning changeover was performed.  Fascia of the colostomy space was closed with 0 PDS in running fashion.  Additional Marcaine  was placed into the area.  Pursestring with 3-0 Vicryl was performed at the skin to keep a slightly open wound and packed with saline moistened gauze.  All other incisions were closed with 4-0 Monocryl subcuticular interrupted sutures.  Dermabond was put in place for dressing.  Patient woke from anesthesia brought to PACU in stable condition.  All counts were correct.   Findings: intact anastomosis at 20 cm  Specimen: none  Implant: packing to left wound   Blood loss: 50 ml  Local anesthesia: 30 ml marcaine    Complications: none  Herlene Bureau, M.D. General, Bariatric, & Minimally Invasive Surgery [facility]

## 2023-12-02 NOTE — Anesthesia Procedure Notes (Signed)
 Procedure Name: Intubation Date/Time: 12/02/2023 11:26 AM  Performed by: Even Budlong D, CRNAPre-anesthesia Checklist: Patient identified, Emergency Drugs available, Suction available and Patient being monitored Patient Re-evaluated:Patient Re-evaluated prior to induction Oxygen Delivery Method: Circle system utilized Preoxygenation: Pre-oxygenation with 100% oxygen Induction Type: IV induction Ventilation: Mask ventilation without difficulty Laryngoscope Size: Mac and 3 Grade View: Grade I Tube type: Oral Tube size: 7.0 mm Number of attempts: 1 Airway Equipment and Method: Stylet and Oral airway Placement Confirmation: ETT inserted through vocal cords under direct vision, positive ETCO2 and breath sounds checked- equal and bilateral Secured at: 20 cm Tube secured with: Tape Dental Injury: Teeth and Oropharynx as per pre-operative assessment

## 2023-12-02 NOTE — H&P (Signed)
 Chief Complaint:        Chief Complaint  Patient presents with   Follow-up      LTFU discuss reversal, pt has bowel prep already, sigmoid colectomy/colostomy      Subjective   SHANTE ARCHAMBEAULT is a 73 y.o. female established patient in today for: History of Present Illness KRISTEL DURKEE is a 73 year old female who presents for a surgical consultation regarding her colostomy reversal.   She experiences significant anxiety about the upcoming colostomy reversal surgery and inquires about preoperative sedation to manage her nerves. She is concerned about the surgical process, specifically whether she will need to be cut again where her colostomy is located. She asks about the surgical technique, including the use of small incisions and the potential for a larger incision if necessary. She has already picked up all the necessary preoperative medications, including antibiotics.   There is no problem list on file for this patient.           Outpatient Medications Prior to Visit  Medication Sig Dispense Refill   atorvastatin (LIPITOR) 20 MG tablet Take 20 mg by mouth once daily       bisacodyL (DULCOLAX) 5 mg EC tablet Take 4 tablets (20 mg total) by mouth once daily as needed for Constipation for up to 1 dose 4 tablet 0   lactulose  (ENULOSE ) 10 gram/15 mL oral solution 15 mL orally 3 TIMES A DAY       lactulose  (ENULOSE ) 10 gram/15 mL oral solution Take 30 mLs by mouth once daily for 360 days 2700 mL 3   omeprazole (PRILOSEC) 40 MG DR capsule 1 cap(s) orally once a day for 30 days        No facility-administered medications prior to visit.          Objective       Vitals:    10/29/23 1432 10/29/23 1433  BP: 98/66    Pulse: 98    Temp: 36.7 C (98 F)    SpO2: 98%    Weight: 45.5 kg (100 lb 6.4 oz)    Height: 154.9 cm (5' 1)    PainSc:   0-No pain    Body mass index is 18.97 kg/m. Physical Exam Constitutional:      Appearance: Normal appearance.  HENT:     Head:  Normocephalic and atraumatic.  Pulmonary:     Effort: Pulmonary effort is normal.  Abdominal:     Comments: Well healed wound, right sided colostomy  Musculoskeletal:        General: Normal range of motion.     Cervical back: Normal range of motion.  Neurological:     General: No focal deficit present.     Mental Status: She is alert and oriented to person, place, and time. Mental status is at baseline.  Psychiatric:        Mood and Affect: Mood normal.        Behavior: Behavior normal.        Thought Content: Thought content normal.     Results DIAGNOSTIC Colonoscopy: Normal       I reviewed colonoscopy results by Dr. Belvie Just showing no tumors or polyps removed, though he was unable to examine the rectum due to stool balls. I reviewed notes by Camie Sevin showing a neuro work up with some mild cognitive delay but no focal deficits or reasons to think surgery is contraindicated.   Assessment/Plan:    Assessment & Plan Colostomy reversal  Scheduled for colostomy reversal with small incisions. Risk of larger incision 10% or less. Procedure duration approximately three hours. Hospital stay expected three to five days. 1% risk of postoperative complications. Possible loop ileostomy if testing problematic. - Proceed with colostomy reversal surgery. - Plan for a hospital stay of three to five days post-surgery.   Preoperative anxiety Expressed significant anxiety about surgery and requested medication. - Administer preoperative anxiolytic medication on the day of surgery. - Document preoperative anxiety in her chart. - Encourage communication with the preoperative team about anxiety. Diagnoses and all orders for this visit:   Colostomy care (CMS/HHS-HCC)   Intussusception of colon (CMS/HHS-HCC)

## 2023-12-02 NOTE — Transfer of Care (Signed)
 Immediate Anesthesia Transfer of Care Note  Patient: Sarah Moore  Procedure(s) Performed: CLOSURE, COLOSTOMY, ROBOT-ASSISTED (Abdomen)  Patient Location: PACU  Anesthesia Type:General  Level of Consciousness: awake, alert , and oriented  Airway & Oxygen Therapy: Patient Spontanous Breathing and Patient connected to face mask oxygen  Post-op Assessment: Report given to RN and Post -op Vital signs reviewed and stable  Post vital signs: Reviewed and stable  Last Vitals:  Vitals Value Taken Time  BP 125/61 12/02/23 13:52  Temp    Pulse 68 12/02/23 13:54  Resp 20 12/02/23 13:54  SpO2 100 % 12/02/23 13:54  Vitals shown include unfiled device data.  Last Pain:  Vitals:   12/02/23 1053  TempSrc:   PainSc: 0-No pain         Complications: No notable events documented.

## 2023-12-02 NOTE — Anesthesia Postprocedure Evaluation (Signed)
 Anesthesia Post Note  Patient: Sarah Moore  Procedure(s) Performed: CLOSURE, COLOSTOMY, ROBOT-ASSISTED (Abdomen)     Patient location during evaluation: PACU Anesthesia Type: General Level of consciousness: sedated Pain management: pain level controlled Vital Signs Assessment: post-procedure vital signs reviewed and stable Respiratory status: spontaneous breathing, nonlabored ventilation, respiratory function stable and patient connected to face mask oxygen Cardiovascular status: blood pressure returned to baseline and stable Postop Assessment: no apparent nausea or vomiting Anesthetic complications: no   No notable events documented.  Last Vitals:  Vitals:   12/02/23 1400 12/02/23 1401  BP: 138/62 138/62  Pulse: 65 64  Resp: 15 19  Temp:    SpO2: 100% 100%    Last Pain:  Vitals:   12/02/23 1354  TempSrc:   PainSc: 0-No pain                 Dakia Schifano,W. EDMOND

## 2023-12-03 ENCOUNTER — Encounter (HOSPITAL_COMMUNITY): Payer: Self-pay | Admitting: General Surgery

## 2023-12-03 LAB — BASIC METABOLIC PANEL WITH GFR
Anion gap: 8 (ref 5–15)
BUN: 7 mg/dL — ABNORMAL LOW (ref 8–23)
CO2: 25 mmol/L (ref 22–32)
Calcium: 9.9 mg/dL (ref 8.9–10.3)
Chloride: 105 mmol/L (ref 98–111)
Creatinine, Ser: 0.52 mg/dL (ref 0.44–1.00)
GFR, Estimated: >60 mL/min (ref >60)
Glucose, Bld: 115 mg/dL — ABNORMAL HIGH (ref 70–99)
Potassium: 4.4 mmol/L (ref 3.5–5.1)
Sodium: 138 mmol/L (ref 135–145)

## 2023-12-03 LAB — CBC
HCT: 34.2 % — ABNORMAL LOW (ref 36.0–46.0)
Hemoglobin: 10.8 g/dL — ABNORMAL LOW (ref 12.0–15.0)
MCH: 29.9 pg (ref 26.0–34.0)
MCHC: 31.6 g/dL (ref 30.0–36.0)
MCV: 94.7 fL (ref 80.0–100.0)
Platelets: 197 K/uL (ref 150–400)
RBC: 3.61 MIL/uL — ABNORMAL LOW (ref 3.87–5.11)
RDW: 13.3 % (ref 11.5–15.5)
WBC: 11.1 K/uL — ABNORMAL HIGH (ref 4.0–10.5)
nRBC: 0 % (ref 0.0–0.2)

## 2023-12-03 NOTE — Progress Notes (Signed)
  1 Day Post-Op   Chief Complaint/Subjective: Headache, tolerated some liquids yesterday  Objective: Vital signs in last 24 hours: Temp:  [97 F (36.1 C)-98.3 F (36.8 C)] 98 F (36.7 C) (08/19 0512) Pulse Rate:  [64-82] 75 (08/19 0512) Resp:  [15-25] 17 (08/19 0512) BP: (120-142)/(61-84) 136/70 (08/19 0512) SpO2:  [97 %-100 %] 97 % (08/19 0512) Weight:  [45 kg-51.4 kg] 51.4 kg (08/19 0512) Last BM Date :  (colostomy takedown) Intake/Output from previous day: 08/18 0701 - 08/19 0700 In: 2181.3 [P.O.:170; I.V.:2011.3] Out: 2475 [Urine:2450; Blood:25]  PE: Gen: NAD Resp: nonlabored Card: RRR Abd: incisions c/d/I, moderate distension  Lab Results:  Recent Labs    12/02/23 1600 12/03/23 0502  WBC 10.0 11.1*  HGB 11.0* 10.8*  HCT 36.8 34.2*  PLT 179 197   Recent Labs    12/02/23 1600 12/03/23 0502  NA  --  138  K  --  4.4  CL  --  105  CO2  --  25  GLUCOSE  --  115*  BUN  --  7*  CREATININE 0.59 0.52  CALCIUM  --  9.9   No results for input(s): LABPROT, INR in the last 72 hours.    Component Value Date/Time   NA 138 12/03/2023 0502   NA 144 10/11/2008 1245   K 4.4 12/03/2023 0502   K 4.4 10/11/2008 1245   CL 105 12/03/2023 0502   CL 105 10/11/2008 1245   CO2 25 12/03/2023 0502   CO2 28 10/11/2008 1245   GLUCOSE 115 (H) 12/03/2023 0502   GLUCOSE 123 (H) 10/11/2008 1245   BUN 7 (L) 12/03/2023 0502   BUN 15 10/11/2008 1245   CREATININE 0.52 12/03/2023 0502   CREATININE 0.6 10/11/2008 1245   CALCIUM 9.9 12/03/2023 0502   CALCIUM 10.3 10/11/2008 1245   PROT 7.0 08/27/2022 0016   PROT 7.6 10/11/2008 1245   ALBUMIN  2.1 (L) 08/27/2022 0016   ALBUMIN  3.2 (L) 10/11/2008 1245   AST 216 (H) 08/27/2022 0016   AST 29 10/11/2008 1245   ALT 190 (H) 08/27/2022 0016   ALT 39 10/11/2008 1245   ALKPHOS 271 (H) 08/27/2022 0016   ALKPHOS 48 10/11/2008 1245   BILITOT 8.3 (H) 08/27/2022 0016   BILITOT 1.40 10/11/2008 1245   GFRNONAA >60 12/03/2023 0502    GFRAA >60 12/17/2019 1530    Assessment/Plan  s/p Procedure(s): CLOSURE, COLOSTOMY, ROBOT-ASSISTED 12/02/2023  -ambulate -foley out -wound care  FEN - reg diet VTE - lovenox  ID - peirop abx Disposition - inpatient   LOS: 1 day   I reviewed last 24 h vitals and pain scores, last 48 h intake and output, last 24 h labs and trends, and last 24 h imaging results.  This care required moderate level of medical decision making.   Herlene Righter [facility] Surgery at [facility] 12/03/2023, 8:34 AM Please see Amion for pager number during day hours 7:00am-4:30pm or 7:00am -11:30am on weekends

## 2023-12-03 NOTE — Plan of Care (Signed)
  Problem: Skin Integrity: Goal: Will show signs of wound healing Outcome: Progressing   Problem: Education: Goal: Knowledge of General Education information will improve Description: Including pain rating scale, medication(s)/side effects and non-pharmacologic comfort measures Outcome: Progressing

## 2023-12-04 NOTE — Progress Notes (Signed)
  2 Days Post-Op   Chief Complaint/Subjective: +BM, tolerating diet  Objective: Vital signs in last 24 hours: Temp:  [97.7 F (36.5 C)-98.5 F (36.9 C)] 97.7 F (36.5 C) (08/20 0537) Pulse Rate:  [67-74] 74 (08/20 0537) Resp:  [17-18] 17 (08/20 0537) BP: (120-135)/(73-77) 120/73 (08/20 0537) SpO2:  [95 %-98 %] 95 % (08/20 0537) Weight:  [51.4 kg] 51.4 kg (08/20 0500) Last BM Date : 12/02/23 Intake/Output from previous day: 08/19 0701 - 08/20 0700 In: 2147.6 [P.O.:480; I.V.:1667.6] Out: 1900 [Urine:1900]  PE: Gen: NAD Resp: nonlabored Card: RRR Abd: soft, distended, incision c/d/i  Lab Results:  Recent Labs    12/02/23 1600 12/03/23 0502  WBC 10.0 11.1*  HGB 11.0* 10.8*  HCT 36.8 34.2*  PLT 179 197   Recent Labs    12/02/23 1600 12/03/23 0502  NA  --  138  K  --  4.4  CL  --  105  CO2  --  25  GLUCOSE  --  115*  BUN  --  7*  CREATININE 0.59 0.52  CALCIUM  --  9.9   No results for input(s): LABPROT, INR in the last 72 hours.    Component Value Date/Time   NA 138 12/03/2023 0502   NA 144 10/11/2008 1245   K 4.4 12/03/2023 0502   K 4.4 10/11/2008 1245   CL 105 12/03/2023 0502   CL 105 10/11/2008 1245   CO2 25 12/03/2023 0502   CO2 28 10/11/2008 1245   GLUCOSE 115 (H) 12/03/2023 0502   GLUCOSE 123 (H) 10/11/2008 1245   BUN 7 (L) 12/03/2023 0502   BUN 15 10/11/2008 1245   CREATININE 0.52 12/03/2023 0502   CREATININE 0.6 10/11/2008 1245   CALCIUM 9.9 12/03/2023 0502   CALCIUM 10.3 10/11/2008 1245   PROT 7.0 08/27/2022 0016   PROT 7.6 10/11/2008 1245   ALBUMIN  2.1 (L) 08/27/2022 0016   ALBUMIN  3.2 (L) 10/11/2008 1245   AST 216 (H) 08/27/2022 0016   AST 29 10/11/2008 1245   ALT 190 (H) 08/27/2022 0016   ALT 39 10/11/2008 1245   ALKPHOS 271 (H) 08/27/2022 0016   ALKPHOS 48 10/11/2008 1245   BILITOT 8.3 (H) 08/27/2022 0016   BILITOT 1.40 10/11/2008 1245   GFRNONAA >60 12/03/2023 0502   GFRAA >60 12/17/2019 1530    Assessment/Plan  s/p  Procedure(s): CLOSURE, COLOSTOMY, ROBOT-ASSISTED 12/02/2023    FEN - reg diet, recommend continued entereg  this morning due to distension VTE - lovenox  ID - periop Disposition - inpatient, ambulate   LOS: 2 days   I reviewed last 24 h vitals and pain scores, last 48 h intake and output, last 24 h labs and trends, and last 24 h imaging results.  This care required moderate level of medical decision making.   Herlene Righter [facility] at [facility] 12/04/2023, 10:14 AM Please see Amion for pager number during day hours 7:00am-4:30pm or 7:00am -11:30am on weekends

## 2023-12-04 NOTE — Plan of Care (Signed)
  Problem: Education: Goal: Understanding of discharge needs will improve Outcome: Progressing Goal: Verbalization of understanding of the causes of altered bowel function will improve Outcome: Progressing   Problem: Activity: Goal: Ability to tolerate increased activity will improve Outcome: Progressing

## 2023-12-04 NOTE — Plan of Care (Signed)
  Problem: Education: Goal: Understanding of discharge needs will improve Outcome: Progressing Goal: Verbalization of understanding of the causes of altered bowel function will improve Outcome: Progressing   Problem: Activity: Goal: Ability to tolerate increased activity will improve Outcome: Progressing   Problem: Bowel/Gastric: Goal: Gastrointestinal status for postoperative course will improve Outcome: Progressing   Problem: Pain Managment: Goal: General experience of comfort will improve and/or be controlled Outcome: Progressing   Problem: Elimination: Goal: Will not experience complications related to bowel motility Outcome: Progressing Goal: Will not experience complications related to urinary retention Outcome: Progressing

## 2023-12-05 MED ORDER — HYDROCORTISONE (PERIANAL) 2.5 % EX CREA
TOPICAL_CREAM | Freq: Three times a day (TID) | CUTANEOUS | Status: DC
  Administered 2023-12-05: 1 via RECTAL
  Filled 2023-12-05: qty 28.35

## 2023-12-05 MED ORDER — HYDROCORTISONE (PERIANAL) 2.5 % EX CREA: TOPICAL_CREAM | Freq: Three times a day (TID) | CUTANEOUS | 0 refills | Status: AC

## 2023-12-05 MED ORDER — OXYCODONE HCL 5 MG PO TABS: 5.0000 mg | ORAL_TABLET | Freq: Four times a day (QID) | ORAL | 0 refills | Status: AC | PRN

## 2023-12-05 NOTE — Plan of Care (Signed)

## 2023-12-05 NOTE — Progress Notes (Signed)
 Pt received discharged education and I answered all questions and concerns prior to discharge. Will be discharged through the front entrance via wheelchair.

## 2023-12-05 NOTE — Progress Notes (Signed)
   12/05/23 1013  TOC Brief Assessment  Insurance and Status Reviewed  Patient has primary care physician Yes  Home environment has been reviewed home with family to assist as needed  Prior level of function: independent  Prior/Current Home Services No current home services  Social Drivers of Health Review SDOH reviewed no interventions necessary  Readmission risk has been reviewed Yes  Transition of care needs no transition of care needs at this time

## 2023-12-06 NOTE — Discharge Summary (Signed)
 Physician Discharge Summary  Sarah Moore FMW:[id number] DOB: 02/06/1951 DOA: 12/02/2023  PCP: Rosalea Rosina SAILOR, PA  Admit date: 12/02/2023 Discharge date: 12/05/2023   Recommendations for Outpatient Follow-up:   (include homehealth, outpatient follow-up instructions, specific recommendations for PCP to follow-up on, etc.)   Discharge Diagnoses:  Principal Problem:   Colostomy care [facility]) Active Problems:   History of colostomy reversal   Surgical Procedure: robotic colostomy reversal  Discharge Condition: Good Disposition: Home  Diet recommendation: reg diet   Hospital Course:  73 yo female underwent robotic colostomy reversal. She had return of bowel function POD 1. She was advanced on diet and discharged home POD 3.  Discharge Instructions  Discharge Instructions     Diet - low sodium heart healthy   Complete by: As directed    Discharge wound care:   Complete by: As directed    Shower normal tomorrow. Glue to stay on for 10-14 days. Daily packing of previous ostomy site wound with moistened gauze.   Increase activity slowly   Complete by: As directed       Allergies as of 12/05/2023       Reactions   Codeine Palpitations        Medication List     STOP taking these medications    lidocaine  5 % ointment Commonly known as: XYLOCAINE    traMADol  50 MG tablet Commonly known as: ULTRAM        TAKE these medications    atorvastatin 80 MG tablet Commonly known as: LIPITOR Take 80 mg by mouth at bedtime.   hydrocortisone  2.5 % rectal cream Commonly known as: ANUSOL -HC Place rectally 3 (three) times daily.   lactulose  10 GM/15ML solution Commonly known as: CHRONULAC  Place 30 mLs (20 g total) into feeding tube 3 (three) times daily.   oxyCODONE  5 MG immediate release tablet Commonly known as: Oxy IR/ROXICODONE  Take 1 tablet (5 mg total) by mouth every 6 (six) hours as needed for severe pain (pain score 7-10).   REFRESH DRY EYE THERAPY  OP Place 1 drop into both eyes daily.   Systane 0.4-0.3 % Gel ophthalmic gel Generic drug: Polyethyl Glycol-Propyl Glycol Place 1 Application into both eyes at bedtime.               Discharge Care Instructions  (From admission, onward)           Start     Ordered   12/05/23 0000  Discharge wound care:       Comments: Shower normal tomorrow. Glue to stay on for 10-14 days. Daily packing of previous ostomy site wound with moistened gauze.   12/05/23 1226              The results of significant diagnostics from this hospitalization (including imaging, microbiology, ancillary and laboratory) are listed below for reference.    Significant Diagnostic Studies: No results found.  Labs: Basic Metabolic Panel: Recent Labs  Lab 12/02/23 1600 12/03/23 0502  NA  --  138  K  --  4.4  CL  --  105  CO2  --  25  GLUCOSE  --  115*  BUN  --  7*  CREATININE 0.59 0.52  CALCIUM  --  9.9   Liver Function Tests: No results for input(s): AST, ALT, ALKPHOS, BILITOT, PROT, ALBUMIN  in the last 168 hours.  CBC: Recent Labs  Lab 12/02/23 1600 12/03/23 0502  WBC 10.0 11.1*  HGB 11.0* 10.8*  HCT 36.8 34.2*  MCV 98.4 94.7  PLT 179 197    CBG: No results for input(s): GLUCAP in the last 168 hours.  Principal Problem:   Colostomy care [facility]) Active Problems:   History of colostomy reversal   Time coordinating discharge: 15 min

## 2023-12-30 DIAGNOSIS — M1990 Unspecified osteoarthritis, unspecified site: Secondary | ICD-10-CM | POA: Diagnosis not present

## 2023-12-30 DIAGNOSIS — E559 Vitamin D deficiency, unspecified: Secondary | ICD-10-CM | POA: Diagnosis not present

## 2023-12-30 DIAGNOSIS — E782 Mixed hyperlipidemia: Secondary | ICD-10-CM | POA: Diagnosis not present

## 2023-12-30 DIAGNOSIS — K581 Irritable bowel syndrome with constipation: Secondary | ICD-10-CM | POA: Diagnosis not present

## 2023-12-30 DIAGNOSIS — K219 Gastro-esophageal reflux disease without esophagitis: Secondary | ICD-10-CM | POA: Diagnosis not present

## 2023-12-30 DIAGNOSIS — J302 Other seasonal allergic rhinitis: Secondary | ICD-10-CM | POA: Diagnosis not present

## 2023-12-30 DIAGNOSIS — Z720 Tobacco use: Secondary | ICD-10-CM | POA: Diagnosis not present

## 2024-02-03 ENCOUNTER — Other Ambulatory Visit: Payer: Medicare HMO

## 2024-02-04 ENCOUNTER — Other Ambulatory Visit (HOSPITAL_BASED_OUTPATIENT_CLINIC_OR_DEPARTMENT_OTHER)

## 2024-02-11 ENCOUNTER — Other Ambulatory Visit (HOSPITAL_BASED_OUTPATIENT_CLINIC_OR_DEPARTMENT_OTHER)

## 2024-02-17 DIAGNOSIS — H04121 Dry eye syndrome of right lacrimal gland: Secondary | ICD-10-CM | POA: Diagnosis not present

## 2024-02-17 DIAGNOSIS — Z0100 Encounter for examination of eyes and vision without abnormal findings: Secondary | ICD-10-CM | POA: Diagnosis not present

## 2024-02-24 DIAGNOSIS — E559 Vitamin D deficiency, unspecified: Secondary | ICD-10-CM | POA: Diagnosis not present

## 2024-02-24 DIAGNOSIS — Z720 Tobacco use: Secondary | ICD-10-CM | POA: Diagnosis not present

## 2024-02-24 DIAGNOSIS — K581 Irritable bowel syndrome with constipation: Secondary | ICD-10-CM | POA: Diagnosis not present

## 2024-02-24 DIAGNOSIS — E782 Mixed hyperlipidemia: Secondary | ICD-10-CM | POA: Diagnosis not present

## 2024-02-24 DIAGNOSIS — M1990 Unspecified osteoarthritis, unspecified site: Secondary | ICD-10-CM | POA: Diagnosis not present

## 2024-02-24 DIAGNOSIS — Z131 Encounter for screening for diabetes mellitus: Secondary | ICD-10-CM | POA: Diagnosis not present

## 2024-02-24 DIAGNOSIS — K219 Gastro-esophageal reflux disease without esophagitis: Secondary | ICD-10-CM | POA: Diagnosis not present

## 2024-02-24 DIAGNOSIS — J302 Other seasonal allergic rhinitis: Secondary | ICD-10-CM | POA: Diagnosis not present

## 2024-02-24 DIAGNOSIS — Z0000 Encounter for general adult medical examination without abnormal findings: Secondary | ICD-10-CM | POA: Diagnosis not present

## 2024-03-07 DIAGNOSIS — M1990 Unspecified osteoarthritis, unspecified site: Secondary | ICD-10-CM | POA: Diagnosis not present

## 2024-03-07 DIAGNOSIS — K581 Irritable bowel syndrome with constipation: Secondary | ICD-10-CM | POA: Diagnosis not present

## 2024-03-07 DIAGNOSIS — E785 Hyperlipidemia, unspecified: Secondary | ICD-10-CM | POA: Diagnosis not present

## 2024-03-07 DIAGNOSIS — Z809 Family history of malignant neoplasm, unspecified: Secondary | ICD-10-CM | POA: Diagnosis not present

## 2024-03-07 DIAGNOSIS — Z720 Tobacco use: Secondary | ICD-10-CM | POA: Diagnosis not present

## 2024-03-07 DIAGNOSIS — K219 Gastro-esophageal reflux disease without esophagitis: Secondary | ICD-10-CM | POA: Diagnosis not present

## 2024-03-07 DIAGNOSIS — H04129 Dry eye syndrome of unspecified lacrimal gland: Secondary | ICD-10-CM | POA: Diagnosis not present

## 2024-03-09 DIAGNOSIS — J302 Other seasonal allergic rhinitis: Secondary | ICD-10-CM | POA: Diagnosis not present

## 2024-03-09 DIAGNOSIS — K219 Gastro-esophageal reflux disease without esophagitis: Secondary | ICD-10-CM | POA: Diagnosis not present

## 2024-03-09 DIAGNOSIS — M1990 Unspecified osteoarthritis, unspecified site: Secondary | ICD-10-CM | POA: Diagnosis not present

## 2024-03-09 DIAGNOSIS — E782 Mixed hyperlipidemia: Secondary | ICD-10-CM | POA: Diagnosis not present

## 2024-03-09 DIAGNOSIS — K581 Irritable bowel syndrome with constipation: Secondary | ICD-10-CM | POA: Diagnosis not present

## 2024-03-09 DIAGNOSIS — E559 Vitamin D deficiency, unspecified: Secondary | ICD-10-CM | POA: Diagnosis not present

## 2024-03-09 DIAGNOSIS — Z720 Tobacco use: Secondary | ICD-10-CM | POA: Diagnosis not present

## 2024-04-20 ENCOUNTER — Ambulatory Visit: Admitting: Physician Assistant

## 2024-05-18 ENCOUNTER — Other Ambulatory Visit (HOSPITAL_BASED_OUTPATIENT_CLINIC_OR_DEPARTMENT_OTHER)

## 2024-06-08 ENCOUNTER — Ambulatory Visit: Admitting: Physician Assistant

## 2024-12-28 ENCOUNTER — Other Ambulatory Visit (HOSPITAL_BASED_OUTPATIENT_CLINIC_OR_DEPARTMENT_OTHER)
